# Patient Record
Sex: Male | Born: 1945 | Race: White | Hispanic: No | Marital: Married | Smoking: Never smoker
Health system: Southern US, Community
[De-identification: ages and names within clinical notes are randomized; demographics above are authoritative.]

## PROBLEM LIST (undated history)

## (undated) DIAGNOSIS — Z9989 Dependence on other enabling machines and devices: Secondary | ICD-10-CM

## (undated) DIAGNOSIS — G4733 Obstructive sleep apnea (adult) (pediatric): Secondary | ICD-10-CM

## (undated) DIAGNOSIS — R7989 Other specified abnormal findings of blood chemistry: Secondary | ICD-10-CM

## (undated) DIAGNOSIS — I10 Essential (primary) hypertension: Secondary | ICD-10-CM

## (undated) DIAGNOSIS — R42 Dizziness and giddiness: Secondary | ICD-10-CM

## (undated) DIAGNOSIS — Z8546 Personal history of malignant neoplasm of prostate: Secondary | ICD-10-CM

## (undated) DIAGNOSIS — E559 Vitamin D deficiency, unspecified: Secondary | ICD-10-CM

## (undated) DIAGNOSIS — E291 Testicular hypofunction: Secondary | ICD-10-CM

## (undated) DIAGNOSIS — E785 Hyperlipidemia, unspecified: Secondary | ICD-10-CM

## (undated) DIAGNOSIS — G43909 Migraine, unspecified, not intractable, without status migrainosus: Secondary | ICD-10-CM

## (undated) DIAGNOSIS — N281 Cyst of kidney, acquired: Secondary | ICD-10-CM

## (undated) DIAGNOSIS — R51 Headache: Secondary | ICD-10-CM

## (undated) HISTORY — DX: Dependence on other enabling machines and devices: Z99.89

## (undated) HISTORY — DX: Obstructive sleep apnea (adult) (pediatric): G47.33

## (undated) HISTORY — DX: Cyst of kidney, acquired: N28.1

## (undated) HISTORY — DX: Other specified abnormal findings of blood chemistry: R79.89

## (undated) HISTORY — DX: Testicular hypofunction: E29.1

## (undated) HISTORY — DX: Headache: R51

## (undated) HISTORY — DX: Vitamin D deficiency, unspecified: E55.9

## (undated) HISTORY — DX: Essential (primary) hypertension: I10

## (undated) HISTORY — DX: Migraine, unspecified, not intractable, without status migrainosus: G43.909

## (undated) HISTORY — DX: Hyperlipidemia, unspecified: E78.5

## (undated) HISTORY — DX: Personal history of malignant neoplasm of prostate: Z85.46

---

## 2001-05-02 DIAGNOSIS — Z8546 Personal history of malignant neoplasm of prostate: Secondary | ICD-10-CM

## 2001-05-02 HISTORY — DX: Personal history of malignant neoplasm of prostate: Z85.46

## 2001-05-02 HISTORY — PX: PROSTATECTOMY: SHX69

## 2004-05-02 HISTORY — PX: CHOLECYSTECTOMY: SHX55

## 2010-05-02 DIAGNOSIS — R519 Headache, unspecified: Secondary | ICD-10-CM

## 2010-05-02 HISTORY — DX: Headache, unspecified: R51.9

## 2010-08-01 HISTORY — PX: HERNIA REPAIR: SHX51

## 2010-12-01 DIAGNOSIS — N281 Cyst of kidney, acquired: Secondary | ICD-10-CM

## 2010-12-01 HISTORY — DX: Cyst of kidney, acquired: N28.1

## 2010-12-13 LAB — SEDIMENTATION RATE: Sed Rate: 3 mm

## 2012-07-02 LAB — BASIC METABOLIC PANEL
ALT: 16 U/L (ref 10–40)
Creat: 0.9
Glucose: 96
Sodium: 145 mmol/L (ref 137–147)

## 2012-07-02 LAB — TESTOSTERONE: Testosterone: 339

## 2012-07-02 LAB — CBC
HGB: 15.9 g/dL
WBC: 5.6

## 2012-07-02 LAB — LIPID PANEL
Direct LDL: 101
HDL: 37 mg/dL (ref 35–70)
Triglycerides: 321

## 2012-07-02 LAB — PSA: PSA: <0.1

## 2012-07-02 LAB — TESTOSTERONE, FREE: Testosterone, Free: 6.3

## 2012-10-23 ENCOUNTER — Encounter: Payer: Self-pay | Admitting: Family Medicine

## 2012-10-23 ENCOUNTER — Ambulatory Visit (INDEPENDENT_AMBULATORY_CARE_PROVIDER_SITE_OTHER): Payer: Medicare Other | Admitting: Family Medicine

## 2012-10-23 VITALS — BP 158/86 | HR 76 | Temp 98.0°F | Ht 72.0 in | Wt 210.2 lb

## 2012-10-23 DIAGNOSIS — R51 Headache: Secondary | ICD-10-CM

## 2012-10-23 DIAGNOSIS — E785 Hyperlipidemia, unspecified: Secondary | ICD-10-CM | POA: Insufficient documentation

## 2012-10-23 DIAGNOSIS — I10 Essential (primary) hypertension: Secondary | ICD-10-CM

## 2012-10-23 DIAGNOSIS — G4733 Obstructive sleep apnea (adult) (pediatric): Secondary | ICD-10-CM

## 2012-10-23 DIAGNOSIS — R519 Headache, unspecified: Secondary | ICD-10-CM | POA: Insufficient documentation

## 2012-10-23 DIAGNOSIS — E291 Testicular hypofunction: Secondary | ICD-10-CM

## 2012-10-23 DIAGNOSIS — Z8546 Personal history of malignant neoplasm of prostate: Secondary | ICD-10-CM

## 2012-10-23 DIAGNOSIS — R7989 Other specified abnormal findings of blood chemistry: Secondary | ICD-10-CM | POA: Insufficient documentation

## 2012-10-23 DIAGNOSIS — Z9989 Dependence on other enabling machines and devices: Secondary | ICD-10-CM | POA: Insufficient documentation

## 2012-10-23 NOTE — Assessment & Plan Note (Signed)
Will await records from neurologists and headache specialist Consider CRP/ESR given concern for temporal arteritis.

## 2012-10-23 NOTE — Assessment & Plan Note (Signed)
Elevated today - attributed to new office. Recheck next visit, if persistently elevated, consider addition of ACEI vs HCTZ.

## 2012-10-23 NOTE — Assessment & Plan Note (Signed)
Await records from prior PCP/uro.

## 2012-10-23 NOTE — Assessment & Plan Note (Signed)
Check FLP when returns fasting. 

## 2012-10-23 NOTE — Progress Notes (Signed)
Subjective:    Patient ID: Evan Robinson, male    DOB: May 11, 1945, 67 y.o.   MRN: [id number]  HPI CC: new pt to establish  "Evan Robinson".  Recently moved from [place].    HTN - at home running high around 140sbp.  Has tried water pill which didn't help.  Years ago beta blocker caused adverse effects.  HLD - stopped chol med 1 year ago.  Chronic headaches up to days at a time - started 2 years ago.  Has even tried stopping statin which hasn't helped.  Takes omega krill oil daily.  Would like chol levels checked.  Work-up so far - brings list: ENT evaluation, chiropractor eval, accupuncture, ophtho eval (no glaucoma), botox treatments to neck and scalp, spinal tap, 2 brain and neck MRIs, 2 CT scans, food allergy testing, 3 different neurologists even HA specialist (Dr. Jamse [place]), multiple med regimens, changed diet to vegan diet, changed all chronic meds - all these things didn't help.  Now seeing genetic specialist at [facility].  Describes headaches as bilateral dull temporal throbbing, when worst pain has associated blurry vision.   Takes hydrocodone which doesn't really help.    OSA - on CPAP.  ambien to sleep at night.  "Evan Robinson" Lives with wife Occupation: retired, was Loss adjuster, chartered Activity: walks daily, some gym Diet:   Preventative: Last CPE was last summer Colonoscopy pending 09/2012 [facility]  Medications and allergies reviewed and updated in chart.  Past histories reviewed and updated if relevant as below. There are no active problems to display for this patient.  Past Medical History  Diagnosis Date  . HTN (hypertension)   . Generalized headaches   . Low testosterone   . H/O prostate cancer 2003    s/p radical prostatectomy (uro Dr. Rayburn Ma in [place] yearly)  . Dyslipidemia   . Migraines    Past Surgical History  Procedure Laterality Date  . Cholecystectomy  2006  . Hernia repair  2011  . Prostatectomy  2003   History  Substance Use Topics  . Smoking status: Never  Smoker   . Smokeless tobacco: Never Used  . Alcohol Use: No   Family History  Problem Relation Age of Onset  . Alcohol abuse Father   . Alcohol abuse Paternal Grandfather   . Stroke Mother   . Cancer Father     prostate  . CAD Father     possible MI  . Diabetes Neg Hx    No Known Allergies No current outpatient prescriptions on file prior to visit.   No current facility-administered medications on file prior to visit.     Review of Systems  Constitutional: Negative for fever, chills, activity change, appetite change, fatigue and unexpected weight change.  HENT: Negative for hearing loss and neck pain.   Eyes: Negative for visual disturbance.  Respiratory: Negative for cough, chest tightness, shortness of breath and wheezing.   Cardiovascular: Negative for chest pain, palpitations and leg swelling.  Gastrointestinal: Negative for nausea, vomiting, abdominal pain, diarrhea, constipation, blood in stool and abdominal distention.  Genitourinary: Negative for hematuria and difficulty urinating.  Musculoskeletal: Negative for myalgias and arthralgias.  Skin: Negative for rash.  Neurological: Positive for headaches. Negative for dizziness, seizures and syncope.  Hematological: Negative for adenopathy. Does not bruise/bleed easily.  Psychiatric/Behavioral: Negative for dysphoric mood. The patient is not nervous/anxious.        Objective:   Physical Exam  Nursing note and vitals reviewed. Constitutional: He is oriented to person, place, and time. He  appears well-developed and well-nourished. No distress.  HENT:  Head: Normocephalic and atraumatic.  Right Ear: Hearing, tympanic membrane, external ear and ear canal normal.  Left Ear: Hearing, tympanic membrane, external ear and ear canal normal.  Nose: Nose normal. No mucosal edema or rhinorrhea.  Mouth/Throat: Uvula is midline, oropharynx is clear and moist and mucous membranes are normal. No oropharyngeal exudate, posterior  oropharyngeal edema, posterior oropharyngeal erythema or tonsillar abscesses.  No pain bitemporal region  Eyes: Conjunctivae and EOM are normal. Pupils are equal, round, and reactive to light. No scleral icterus.  Neck: Normal range of motion. Neck supple. No thyromegaly present.  Cardiovascular: Normal rate, regular rhythm, normal heart sounds and intact distal pulses.   No murmur heard. Pulses:      Radial pulses are 2+ on the right side, and 2+ on the left side.  Pulmonary/Chest: Effort normal and breath sounds normal. No respiratory distress. He has no wheezes. He has no rales.  Abdominal: Soft. Bowel sounds are normal. He exhibits no distension and no mass. There is no tenderness. There is no rebound and no guarding.  Musculoskeletal: Normal range of motion. He exhibits no edema.  Lymphadenopathy:    He has no cervical adenopathy.  Neurological: He is alert and oriented to person, place, and time.  CN grossly intact, station and gait intact  Skin: Skin is warm and dry. No rash noted.  Psychiatric: He has a normal mood and affect. His behavior is normal. Judgment and thought content normal.       Assessment & Plan:

## 2012-10-23 NOTE — Assessment & Plan Note (Signed)
Stable on CPAP 

## 2012-10-23 NOTE — Patient Instructions (Signed)
Let's keep an eye on blood pressure at home.  Let me know if consistently elevated >140/90. We will request records from prior PCP and neurologist for headache workup. Check with neurologist to ensure they don't think this is temporal arteritis. Have colonoscopy report sent to [place] to update your chart. Good to meet you today, call us withq uestions. Return at your convenience in next 2 months fasting for blood work, afterwards for [employer] visit.

## 2012-10-23 NOTE — Assessment & Plan Note (Signed)
Needs yearly PSA.  Sees urologist

## 2012-11-18 ENCOUNTER — Encounter: Payer: Self-pay | Admitting: Family Medicine

## 2012-11-18 DIAGNOSIS — E559 Vitamin D deficiency, unspecified: Secondary | ICD-10-CM | POA: Insufficient documentation

## 2012-11-23 ENCOUNTER — Encounter: Payer: Self-pay | Admitting: Family Medicine

## 2016-12-29 ENCOUNTER — Other Ambulatory Visit: Payer: Self-pay | Admitting: Gastroenterology

## 2016-12-29 DIAGNOSIS — R1084 Generalized abdominal pain: Secondary | ICD-10-CM

## 2016-12-29 DIAGNOSIS — R634 Abnormal weight loss: Secondary | ICD-10-CM

## 2016-12-30 ENCOUNTER — Ambulatory Visit
Admission: RE | Admit: 2016-12-30 | Discharge: 2016-12-30 | Disposition: A | Discharge status: Home / Self Care | Payer: Medicare Other | Source: Ambulatory Visit | Attending: Gastroenterology | Admitting: Gastroenterology

## 2016-12-30 DIAGNOSIS — R634 Abnormal weight loss: Secondary | ICD-10-CM | POA: Diagnosis present

## 2016-12-30 DIAGNOSIS — R1084 Generalized abdominal pain: Secondary | ICD-10-CM | POA: Insufficient documentation

## 2016-12-30 DIAGNOSIS — I700 Atherosclerosis of aorta: Secondary | ICD-10-CM | POA: Diagnosis not present

## 2016-12-30 MED ORDER — IOPAMIDOL (ISOVUE-300) INJECTION 61%
125.0000 mL | Freq: Once | INTRAVENOUS | Status: AC | PRN
  Administered 2016-12-30: 125 mL via INTRAVENOUS

## 2017-01-31 ENCOUNTER — Encounter: Payer: Self-pay | Admitting: *Deleted

## 2017-02-01 ENCOUNTER — Ambulatory Visit: Payer: Medicare Other | Admitting: Anesthesiology

## 2017-02-01 ENCOUNTER — Ambulatory Visit
Admission: RE | Admit: 2017-02-01 | Discharge: 2017-02-01 | Disposition: A | Discharge status: Home / Self Care | Payer: Medicare Other | Source: Ambulatory Visit | Attending: Internal Medicine | Admitting: Internal Medicine

## 2017-02-01 ENCOUNTER — Encounter: Payer: Self-pay | Admitting: *Deleted

## 2017-02-01 ENCOUNTER — Encounter
Admission: RE | Disposition: A | Discharge status: Home / Self Care | Payer: Self-pay | Source: Ambulatory Visit | Attending: Internal Medicine

## 2017-02-01 DIAGNOSIS — I10 Essential (primary) hypertension: Secondary | ICD-10-CM | POA: Insufficient documentation

## 2017-02-01 DIAGNOSIS — K641 Second degree hemorrhoids: Secondary | ICD-10-CM | POA: Diagnosis not present

## 2017-02-01 DIAGNOSIS — Z6826 Body mass index (BMI) 26.0-26.9, adult: Secondary | ICD-10-CM | POA: Insufficient documentation

## 2017-02-01 DIAGNOSIS — K2970 Gastritis, unspecified, without bleeding: Secondary | ICD-10-CM | POA: Diagnosis not present

## 2017-02-01 DIAGNOSIS — Z8546 Personal history of malignant neoplasm of prostate: Secondary | ICD-10-CM | POA: Diagnosis not present

## 2017-02-01 DIAGNOSIS — E785 Hyperlipidemia, unspecified: Secondary | ICD-10-CM | POA: Insufficient documentation

## 2017-02-01 DIAGNOSIS — Q6101 Congenital single renal cyst: Secondary | ICD-10-CM | POA: Diagnosis not present

## 2017-02-01 DIAGNOSIS — Z7982 Long term (current) use of aspirin: Secondary | ICD-10-CM | POA: Insufficient documentation

## 2017-02-01 DIAGNOSIS — R634 Abnormal weight loss: Secondary | ICD-10-CM | POA: Diagnosis present

## 2017-02-01 DIAGNOSIS — G43909 Migraine, unspecified, not intractable, without status migrainosus: Secondary | ICD-10-CM | POA: Diagnosis not present

## 2017-02-01 DIAGNOSIS — E291 Testicular hypofunction: Secondary | ICD-10-CM | POA: Diagnosis not present

## 2017-02-01 DIAGNOSIS — G4733 Obstructive sleep apnea (adult) (pediatric): Secondary | ICD-10-CM | POA: Diagnosis not present

## 2017-02-01 DIAGNOSIS — Z9889 Other specified postprocedural states: Secondary | ICD-10-CM | POA: Insufficient documentation

## 2017-02-01 DIAGNOSIS — R1013 Epigastric pain: Secondary | ICD-10-CM | POA: Insufficient documentation

## 2017-02-01 DIAGNOSIS — Z79899 Other long term (current) drug therapy: Secondary | ICD-10-CM | POA: Insufficient documentation

## 2017-02-01 DIAGNOSIS — F419 Anxiety disorder, unspecified: Secondary | ICD-10-CM | POA: Insufficient documentation

## 2017-02-01 DIAGNOSIS — E559 Vitamin D deficiency, unspecified: Secondary | ICD-10-CM | POA: Diagnosis not present

## 2017-02-01 DIAGNOSIS — K209 Esophagitis, unspecified: Secondary | ICD-10-CM | POA: Diagnosis not present

## 2017-02-01 DIAGNOSIS — Z888 Allergy status to other drugs, medicaments and biological substances status: Secondary | ICD-10-CM | POA: Insufficient documentation

## 2017-02-01 HISTORY — PX: ESOPHAGOGASTRODUODENOSCOPY (EGD) WITH PROPOFOL: SHX5813

## 2017-02-01 HISTORY — PX: COLONOSCOPY WITH PROPOFOL: SHX5780

## 2017-02-01 SURGERY — ESOPHAGOGASTRODUODENOSCOPY (EGD) WITH PROPOFOL
Anesthesia: General

## 2017-02-01 MED ORDER — PROPOFOL 500 MG/50ML IV EMUL
INTRAVENOUS | Status: DC | PRN
  Administered 2017-02-01: 140 ug/kg/min via INTRAVENOUS

## 2017-02-01 MED ORDER — SODIUM CHLORIDE 0.9 % IV SOLN
INTRAVENOUS | Status: DC
  Administered 2017-02-01: 1000 mL via INTRAVENOUS

## 2017-02-01 MED ORDER — PROPOFOL 10 MG/ML IV BOLUS
INTRAVENOUS | Status: DC | PRN
  Administered 2017-02-01: 100 mg via INTRAVENOUS

## 2017-02-01 MED ORDER — LIDOCAINE HCL (CARDIAC) 20 MG/ML IV SOLN
INTRAVENOUS | Status: DC | PRN
  Administered 2017-02-01: 100 mg via INTRAVENOUS

## 2017-02-01 MED ORDER — GLYCOPYRROLATE 0.2 MG/ML IV SOSY
PREFILLED_SYRINGE | INTRAVENOUS | Status: DC | PRN
  Administered 2017-02-01: .2 mg via INTRAVENOUS

## 2017-02-01 MED ORDER — GLYCOPYRROLATE 0.2 MG/ML IJ SOLN
INTRAMUSCULAR | Status: AC
  Filled 2017-02-01: qty 1

## 2017-02-01 MED ORDER — LIDOCAINE HCL (PF) 2 % IJ SOLN
INTRAMUSCULAR | Status: AC
  Filled 2017-02-01: qty 10

## 2017-02-01 MED ORDER — PROPOFOL 500 MG/50ML IV EMUL
INTRAVENOUS | Status: AC
  Filled 2017-02-01: qty 50

## 2017-02-01 NOTE — Interval H&P Note (Signed)
History and Physical Interval Note:  02/01/2017 9:59 AM  Evan Robinson  has presented today for surgery, with the diagnosis of DIFFUSE ABDOMINAL PAIN,WEIGHT LOSS  The various methods of treatment have been discussed with the patient and family. After consideration of risks, benefits and other options for treatment, the patient has consented to  Procedure(s): ESOPHAGOGASTRODUODENOSCOPY (EGD) WITH PROPOFOL (N/A) COLONOSCOPY WITH PROPOFOL (N/A) as a surgical intervention .  The patient's history has been reviewed, patient examined, no change in status, stable for surgery.  I have reviewed the patient's chart and labs.  Questions were answered to the patient's satisfaction.     [place], [place]

## 2017-02-01 NOTE — Transfer of Care (Signed)
Immediate Anesthesia Transfer of Care Note  Patient: Evan Robinson  Procedure(s) Performed: ESOPHAGOGASTRODUODENOSCOPY (EGD) WITH PROPOFOL (N/A ) COLONOSCOPY WITH PROPOFOL (N/A )  Patient Location: Endoscopy Unit  Anesthesia Type:General  Level of Consciousness: sedated  Airway & Oxygen Therapy: Patient Spontanous Breathing and Patient connected to nasal cannula oxygen  Post-op Assessment: Report given to RN and Post -op Vital signs reviewed and stable  Post vital signs: Reviewed and stable  Last Vitals:  Vitals:   02/01/17 0934  BP: (!) 171/83  Pulse: 85  Resp: 18  Temp: 36.6 C  SpO2: 98%    Last Pain:  Vitals:   02/01/17 0934  TempSrc: Tympanic  PainSc: 10-Worst pain ever         Complications: No apparent anesthesia complications

## 2017-02-01 NOTE — Anesthesia Post-op Follow-up Note (Signed)
Anesthesia QCDR form completed.        

## 2017-02-01 NOTE — Anesthesia Preprocedure Evaluation (Signed)
Anesthesia Evaluation  Patient identified by MRN, date of birth, ID band Patient awake    Reviewed: Allergy & Precautions, H&P , NPO status , Patient's Chart, lab work & pertinent test results  History of Anesthesia Complications Negative for: history of anesthetic complications  Airway Mallampati: III  TM Distance: <3 FB Neck ROM: limited    Dental  (+) Poor Dentition, Chipped   Pulmonary neg shortness of breath, sleep apnea and Continuous Positive Airway Pressure Ventilation ,           Cardiovascular Exercise Tolerance: Good hypertension, (-) angina(-) Past MI and (-) DOE      Neuro/Psych  Headaches, negative psych ROS   GI/Hepatic negative GI ROS, Neg liver ROS, GERD  Medicated and Controlled,  Endo/Other  negative endocrine ROS  Renal/GU Renal disease  negative genitourinary   Musculoskeletal   Abdominal   Peds  Hematology negative hematology ROS (+)   Anesthesia Other Findings Past Medical History: No date: Dyslipidemia 2003: H/O prostate cancer     Comment:  s/p radical prostatectomy (uro Dr. Rush Farmer in [place]               yearly) 2012: Headache     Comment:  thorough w/u - unrevealing.  has seen 3 neurologists,               MRI, LP nonacute.  ESR = 3 No date: HTN (hypertension) No date: Hypogonadism male No date: Low testosterone No date: Migraines No date: OSA on CPAP 12/2010: Renal cyst     Comment:  complex cyst upper pole L kidney with no malignancy by               MRI No date: Vitamin D deficiency  Past Surgical History: 2006: CHOLECYSTECTOMY 08/2010: HERNIA REPAIR 2003: PROSTATECTOMY  BMI    Body Mass Index:  26.58 kg/m      Reproductive/Obstetrics negative OB ROS                             Anesthesia Physical Anesthesia Plan  ASA: III  Anesthesia Plan: General   Post-op Pain Management:    Induction: Intravenous  PONV Risk Score and Plan:  Propofol infusion  Airway Management Planned: Natural Airway and Nasal Cannula  Additional Equipment:   Intra-op Plan:   Post-operative Plan:   Informed Consent: I have reviewed the patients History and Physical, chart, labs and discussed the procedure including the risks, benefits and alternatives for the proposed anesthesia with the patient or authorized representative who has indicated his/her understanding and acceptance.   Dental Advisory Given  Plan Discussed with: Anesthesiologist, CRNA and Surgeon  Anesthesia Plan Comments: (Patient consented for risks of anesthesia including but not limited to:  - adverse reactions to medications - risk of intubation if required - damage to teeth, lips or other oral mucosa - sore throat or hoarseness - Damage to heart, brain, lungs or loss of life  Patient voiced understanding.)        Anesthesia Quick Evaluation

## 2017-02-01 NOTE — Anesthesia Postprocedure Evaluation (Signed)
Anesthesia Post Note  Patient: Evan Robinson  Procedure(s) Performed: ESOPHAGOGASTRODUODENOSCOPY (EGD) WITH PROPOFOL (N/A ) COLONOSCOPY WITH PROPOFOL (N/A )  Patient location during evaluation: Endoscopy Anesthesia Type: General Level of consciousness: awake and alert Pain management: pain level controlled Vital Signs Assessment: post-procedure vital signs reviewed and stable Respiratory status: spontaneous breathing, nonlabored ventilation, respiratory function stable and patient connected to nasal cannula oxygen Cardiovascular status: blood pressure returned to baseline and stable Postop Assessment: no apparent nausea or vomiting Anesthetic complications: no     Last Vitals:  Vitals:   02/01/17 0934 02/01/17 1033  BP: (!) 171/83   Pulse: 85   Resp: 18   Temp: 36.6 C (!) 36.2 C  SpO2: 98%     Last Pain:  Vitals:   02/01/17 1033  TempSrc: Tympanic  PainSc: Asleep                 [name] Evan Robinson

## 2017-02-01 NOTE — H&P (Signed)
Outpatient short stay form Pre-procedure 02/01/2017 9:56 AM Evan Robinson K. Alice Reichert, M.D.  Primary Physician: Christianne Borrow, M.D.  Reason for visit:  Abdominal pain, weight loss  History of present illness:  71 y/o male with personal hx of anxiety and prostate cancer presents for persistent periumbilical pain and an approximately 10 lb weight loss. He denies any change in bowel habits or weight loss. He denies any ugi symptoms of dysphagia, heartburn, nausea or vomiting. Recent CT scan of the abdomen/pelvis was essentially normal.     Current Facility-Administered Medications:  .  0.9 %  sodium chloride infusion, , Intravenous, Continuous, [place] Grant, Benay Pike, MD, Last Rate: 20 mL/hr at 02/01/17 0950, 1,000 mL at 02/01/17 0950  Facility-Administered Medications Ordered in Other Encounters:  .  glycopyrrolate in normal saline (0.47m/mL) syringe, , Intravenous, Anesthesia Intra-op, [place], JPrecious Haws MD, 0.2 mg at 02/01/17 00093 Prescriptions Prior to Admission  Medication Sig Dispense Refill Last Dose  . gemfibrozil (LOPID) 600 MG tablet Take 600 mg by mouth 2 (two) times daily before a meal.     . losartan (COZAAR) 25 MG tablet Take 25 mg by mouth daily.   02/01/2017 at Unknown time  . ALPRAZolam (XANAX PO) Take by mouth.   Taking  . aspirin 81 MG tablet Take 81 mg by mouth daily.   Taking  . cholecalciferol (VITAMIN D) 1000 UNITS tablet Take 2,000 Units by mouth daily.   Taking  . diltiazem (CARDIZEM CD) 300 MG 24 hr capsule Take 300 mg by mouth daily.   Taking  . HYDROcodone-acetaminophen (NORCO/VICODIN) 5-325 MG per tablet Take 1 tablet by mouth every 6 (six) hours as needed (headache).   Taking  . Magnesium 250 MG TABS Take 250 mg by mouth daily.   Taking  . Multiple Vitamin (MULTIVITAMIN) tablet Take 1 tablet by mouth daily.   Taking  . Zolpidem Tartrate (AMBIEN PO) Take by mouth at bedtime.   Taking  . [DISCONTINUED] KRILL OIL PO Take 500 mg by mouth daily.   Taking  . [DISCONTINUED]  Testosterone 20.25 MG/ACT (1.62%) GEL Place 1.62 mg onto the skin daily.   Taking     Allergies  Allergen Reactions  . Fenofibrate Other (See Comments)    myalgias     Past Medical History:  Diagnosis Date  . Dyslipidemia   . H/O prostate cancer 2003   [place]  . Headache 2012   thorough w/u - unrevealing.  has seen 3 neurologists, MRI, LP nonacute.  ESR = 3  . HTN (hypertension)   . Hypogonadism male   . Low testosterone   . Migraines   . OSA on CPAP   . 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 Vitamin D deficiency     Review of systems:      Physical Exam  General appearance: alert, cooperative and appears stated age Resp: clear to auscultation bilaterally Cardio: regular rate and rhythm, S1, S2 normal, no murmur, click, rub or gallop GI: soft, non-tender; bowel sounds normal; no masses,  no organomegaly     Planned procedures: Proceed with EGD and colonoscopy. The patient understands the nature of the planned procedure, indications, risks, alternatives and potential complications including but not limited to bleeding, infection, perforation, damage to internal organs and possible oversedation/side effects from anesthesia. The patient agrees and gives consent to proceed.  Please refer to procedure notes for findings, recommendations and  patient disposition/instructions.    Evan Robinson K. Alice Reichert, M.D. Gastroenterology 02/01/2017  9:56 AM

## 2017-02-01 NOTE — Op Note (Signed)
[facility] Patient Name: Evan Robinson Procedure Date: 02/01/2017 10:00 AM MRN: [id number] Account #: [id number] Date of Birth: 01/14/1946 Admit Type: Outpatient Age: 71 Room: [facility] ENDO ROOM 1 Gender: Male Note Status: Finalized Procedure:            Upper GI endoscopy Indications:          Epigastric abdominal pain, Weight loss Providers:            Boykin Nearing. Norma Fredrickson MD, MD Referring MD:         No Local Md, MD (Referring MD) Medicines:            Propofol per Anesthesia Complications:        No immediate complications. Procedure:            Pre-Anesthesia Assessment:                       - ASA Grade Assessment: III - A patient with severe                        systemic disease.                       After obtaining informed consent, the endoscope was                        passed under direct vision. Throughout the procedure,                        the patient's blood pressure, pulse, and oxygen                        saturations were monitored continuously. The Endoscope                        was introduced through the mouth, and advanced to the                        third part of duodenum. The upper GI endoscopy was                        accomplished without difficulty. The patient tolerated                        the procedure well. Findings:      LA Grade B (one or more mucosal breaks greater than 5 mm, not extending       between the tops of two mucosal folds) esophagitis with no bleeding was       found 23 cm from the incisors. Biopsies were obtained from the proximal       and distal esophagus with cold forceps for histology of suspected       eosinophilic esophagitis.      Localized mild inflammation characterized by erythema was found in the       gastric antrum. Biopsies were taken with a cold forceps for Helicobacter       pylori testing.      The examined duodenum was normal.      Patchy mildly erythematous mucosa without  active bleeding and with no       stigmata of bleeding was found in the first portion of the duodenum and  in the second portion of the duodenum. Biopsies for histology were taken       with a cold forceps for evaluation of celiac disease. Impression:           - LA Grade B non-erosive esophagitis. Biopsied.                       - Gastritis. Biopsied.                       - Normal examined duodenum.                       - Erythematous duodenopathy. Biopsied. Recommendation:       - Proceed with colonoscopy                       - Proceed with colonoscopy Procedure Code(s):    --- Professional ---                       [phone], Esophagogastroduodenoscopy, flexible, transoral;                        with biopsy, single or multiple Diagnosis Code(s):    --- Professional ---                       K20.8, Other esophagitis                       K29.70, Gastritis, unspecified, without bleeding                       K31.89, Other diseases of stomach and duodenum                       R10.13, Epigastric pain                       R63.4, Abnormal weight loss CPT copyright 2016 American Medical Association. All rights reserved. The codes documented in this report are preliminary and upon coder review may  be revised to meet current compliance requirements. Attending Participation:      I personally performed the entire procedure. Stanton Kidney MD, MD 02/01/2017 10:15:42 AM This report has been signed electronically. Number of Addenda: 0 Note Initiated On: 02/01/2017 10:00 AM      [facility]

## 2017-02-01 NOTE — Op Note (Signed)
[facility] Gastroenterology Patient Name: Evan Robinson Procedure Date: 02/01/2017 10:00 AM MRN: [id number] Account #: [id number] Date of Birth: [birth year] Admit Type: Outpatient Age: 71 Room: [facility] ENDO ROOM 1 Gender: Male Note Status: Finalized Procedure:            Colonoscopy Indications:          Weight loss Providers:            Royce Macadamia K. Norma Fredrickson MD, MD Referring MD:         No Local Md, MD (Referring MD) Medicines:            Propofol per Anesthesia Complications:        No immediate complications. Procedure:            Pre-Anesthesia Assessment:                       - ASA Grade Assessment: III - A patient with severe                        systemic disease.                       - After reviewing the risks and benefits, the patient                        was deemed in satisfactory condition to undergo the                        procedure.                       After obtaining informed consent, the colonoscope was                        passed under direct vision. Throughout the procedure,                        the patient's blood pressure, pulse, and oxygen                        saturations were monitored continuously. The                        Colonoscope was introduced through the anus and                        advanced to the the terminal ileum, with identification                        of the appendiceal orifice and IC valve. The terminal                        ileum, ileocecal valve, appendiceal orifice, and rectum                        were photographed. The colonoscopy was performed                        without difficulty. The patient tolerated the procedure  well. The quality of the bowel preparation was good. Findings:      Non-bleeding internal hemorrhoids were found during retroflexion. The       hemorrhoids were medium-sized and Grade II (internal hemorrhoids that       prolapse but reduce spontaneously).       The entire examined colon appeared normal on direct and retroflexion       views. Impression:           - Non-bleeding internal hemorrhoids.                       - The entire examined colon is normal on direct and                        retroflexion views.                       - No specimens collected. Recommendation:       - Patient has a contact number available for                        emergencies. The signs and symptoms of potential                        delayed complications were discussed with the patient.                        Return to normal activities tomorrow. Written discharge                        instructions were provided to the patient.                       - Resume previous diet.                       - Continue present medications.                       - Repeat colonoscopy in 10 years for screening purposes.                       - Return to GI office as previously scheduled.                       - Telephone GI clinic for pathology results in 1 week.                       - The findings and recommendations were discussed with                        the patient.                       - The findings and recommendations were discussed with                        the patient's family. Procedure Code(s):    --- Professional ---                       [phone], Colonoscopy, flexible; diagnostic, including  collection of specimen(s) by brushing or washing, when                        performed (separate procedure) Diagnosis Code(s):    --- Professional ---                       K64.1, Second degree hemorrhoids                       R63.4, Abnormal weight loss CPT copyright 2016 American Medical Association. All rights reserved. The codes documented in this report are preliminary and upon coder review may  be revised to meet current compliance requirements. Stanton Kidney MD, MD 02/01/2017 10:30:38 AM This report has been signed  electronically. Number of Addenda: 0 Note Initiated On: 02/01/2017 10:00 AM Scope Withdrawal Time: 0 hours 5 minutes 30 seconds  Total Procedure Duration: 0 hours 9 minutes 38 seconds       [facility]

## 2017-02-02 ENCOUNTER — Encounter: Payer: Self-pay | Admitting: Internal Medicine

## 2017-02-03 LAB — SURGICAL PATHOLOGY

## 2017-11-01 ENCOUNTER — Other Ambulatory Visit: Payer: Self-pay | Admitting: Neurology

## 2017-11-01 DIAGNOSIS — R200 Anesthesia of skin: Secondary | ICD-10-CM

## 2017-11-01 DIAGNOSIS — M5412 Radiculopathy, cervical region: Secondary | ICD-10-CM

## 2017-11-10 ENCOUNTER — Other Ambulatory Visit
Admission: RE | Admit: 2017-11-10 | Discharge: 2017-11-10 | Disposition: A | Discharge status: Home / Self Care | Payer: Medicare Other | Source: Ambulatory Visit | Attending: Neurology | Admitting: Neurology

## 2017-11-10 ENCOUNTER — Ambulatory Visit
Admission: RE | Admit: 2017-11-10 | Discharge: 2017-11-10 | Disposition: A | Discharge status: Home / Self Care | Payer: Medicare Other | Source: Ambulatory Visit | Attending: Neurology | Admitting: Neurology

## 2017-11-10 DIAGNOSIS — M5412 Radiculopathy, cervical region: Secondary | ICD-10-CM | POA: Diagnosis not present

## 2017-11-10 DIAGNOSIS — G319 Degenerative disease of nervous system, unspecified: Secondary | ICD-10-CM | POA: Insufficient documentation

## 2017-11-10 DIAGNOSIS — R202 Paresthesia of skin: Secondary | ICD-10-CM | POA: Insufficient documentation

## 2017-11-10 DIAGNOSIS — R200 Anesthesia of skin: Secondary | ICD-10-CM | POA: Diagnosis not present

## 2017-11-10 DIAGNOSIS — G43909 Migraine, unspecified, not intractable, without status migrainosus: Secondary | ICD-10-CM | POA: Diagnosis not present

## 2017-11-10 DIAGNOSIS — M4802 Spinal stenosis, cervical region: Secondary | ICD-10-CM | POA: Insufficient documentation

## 2017-11-10 DIAGNOSIS — M2578 Osteophyte, vertebrae: Secondary | ICD-10-CM | POA: Insufficient documentation

## 2017-11-10 LAB — CREATININE, SERUM
CREATININE: 1.13 mg/dL (ref 0.61–1.24)
GFR calc Af Amer: >60 mL/min (ref >60)

## 2017-11-10 MED ORDER — GADOBENATE DIMEGLUMINE 529 MG/ML IV SOLN
18.0000 mL | Freq: Once | INTRAVENOUS | Status: AC | PRN
  Administered 2017-11-10: 18 mL via INTRAVENOUS

## 2018-03-02 ENCOUNTER — Other Ambulatory Visit: Payer: Self-pay

## 2018-03-02 ENCOUNTER — Encounter: Payer: Medicare Other | Attending: Physical Medicine & Rehabilitation | Admitting: Physical Medicine & Rehabilitation

## 2018-03-02 ENCOUNTER — Encounter: Payer: Self-pay | Admitting: Physical Medicine & Rehabilitation

## 2018-03-02 VITALS — BP 156/75 | HR 68 | Ht 72.0 in | Wt 189.8 lb

## 2018-03-02 DIAGNOSIS — M792 Neuralgia and neuritis, unspecified: Secondary | ICD-10-CM | POA: Diagnosis not present

## 2018-03-02 DIAGNOSIS — M4802 Spinal stenosis, cervical region: Secondary | ICD-10-CM | POA: Diagnosis not present

## 2018-03-02 DIAGNOSIS — G8929 Other chronic pain: Secondary | ICD-10-CM | POA: Diagnosis not present

## 2018-03-02 DIAGNOSIS — I10 Essential (primary) hypertension: Secondary | ICD-10-CM | POA: Insufficient documentation

## 2018-03-02 DIAGNOSIS — G43909 Migraine, unspecified, not intractable, without status migrainosus: Secondary | ICD-10-CM | POA: Diagnosis not present

## 2018-03-02 DIAGNOSIS — G501 Atypical facial pain: Secondary | ICD-10-CM | POA: Diagnosis not present

## 2018-03-02 DIAGNOSIS — G479 Sleep disorder, unspecified: Secondary | ICD-10-CM | POA: Diagnosis not present

## 2018-03-02 DIAGNOSIS — Z8546 Personal history of malignant neoplasm of prostate: Secondary | ICD-10-CM | POA: Diagnosis not present

## 2018-03-02 DIAGNOSIS — E559 Vitamin D deficiency, unspecified: Secondary | ICD-10-CM | POA: Diagnosis not present

## 2018-03-02 DIAGNOSIS — G4733 Obstructive sleep apnea (adult) (pediatric): Secondary | ICD-10-CM | POA: Diagnosis not present

## 2018-03-02 DIAGNOSIS — E785 Hyperlipidemia, unspecified: Secondary | ICD-10-CM | POA: Insufficient documentation

## 2018-03-02 DIAGNOSIS — R208 Other disturbances of skin sensation: Secondary | ICD-10-CM | POA: Insufficient documentation

## 2018-03-02 MED ORDER — GABAPENTIN 600 MG PO TABS: 900.0000 mg | ORAL_TABLET | Freq: Three times a day (TID) | ORAL | 1 refills | Status: DC

## 2018-03-02 NOTE — Progress Notes (Signed)
 Subjective:    Patient ID: Evan Robinson, male    DOB: 06/23/1945, 72 y.o.   MRN: [id number]  HPI 72 y/o year young male with pmh of Vit D deficiency, OSA, migraines, low testosterone, HTN, presents with burning in face and shoulders.  Started ~12/2016. He went to IM, Endo, Derm with negative workup. He had lab work for flushing and workup was negative. He saw Neurology, started on Cymbalta and Topomax without benefit. He had brain/C-spine MRI, which showed cervical stenosis. He saw Neurology and was told not radiculopathy related. Progressively getting worse.  Denies alleviating factors. Associated cold hands. Stationary positions exacerbate the pain.  Burning type pain. Radiates to all extremities at times. Denies associated weakness. Denies falls. Wants to travel and grandchildren. Pheochromocytoma workup negative.  Pain Inventory Average Pain 8 Pain Right Now 8 My pain is burning  In the last 24 hours, has pain interfered with the following? General activity 7 Relation with others 7 Enjoyment of life 7 What TIME of day is your pain at its worst? evening night Sleep (in general) Fair  Pain is worse with: sitting, inactivity and some activites Pain improves with: none Relief from Meds: none  Mobility walk without assistance how many minutes can you walk? unlimited ability to climb steps?  yes do you drive?  yes  Function not employed: date last employed 2008  Neuro/Psych tingling  Prior Studies Any changes since last visit?  no  Physicians involved in your care Primary care Dr. Gaines Neurologist Dr. B   Family History  Problem Relation Age of Onset  . Alcohol abuse Father   . Cancer Father        prostate  . CAD Father        possible MI  . Alcohol abuse Paternal Grandfather   . Stroke Mother   . Diabetes Neg Hx    Social History   Socioeconomic History  . Marital status: Married    Spouse name: Not on file  . Number of children: Not on file  . Years of  education: Not on file  . Highest education level: Not on file  Occupational History  . Not on file  Social Needs  . Financial resource strain: Not on file  . Food insecurity:    Worry: Not on file    Inability: Not on file  . Transportation needs:    Medical: Not on file    Non-medical: Not on file  Tobacco Use  . Smoking status: Never Smoker  . Smokeless tobacco: Never Used  Substance and Sexual Activity  . Alcohol use: No  . Drug use: No  . Sexual activity: Not on file  Lifestyle  . Physical activity:    Days per week: Not on file    Minutes per session: Not on file  . Stress: Not on file  Relationships  . Social connections:    Talks on phone: Not on file    Gets together: Not on file    Attends religious service: Not on file    Active member of club or organization: Not on file    Attends meetings of clubs or organizations: Not on file    Relationship status: Not on file  Other Topics Concern  . Not on file  Social History Narrative   "Gene"   Lives with wife   Occupation: retired, was CPA   Edu:BS   Activity: walks daily, some gym   Diet:    Past Surgical History:  Procedure   Laterality Date  . CHOLECYSTECTOMY  2006  . COLONOSCOPY WITH PROPOFOL N/A 02/01/2017   Procedure: COLONOSCOPY WITH PROPOFOL;  Surgeon: Toledo, Teodoro K, MD;  Location: [facility];  Service: Gastroenterology;  Laterality: N/A;  . ESOPHAGOGASTRODUODENOSCOPY (EGD) WITH PROPOFOL N/A 02/01/2017   Procedure: ESOPHAGOGASTRODUODENOSCOPY (EGD) WITH PROPOFOL;  Surgeon: Toledo, Teodoro K, MD;  Location: [facility];  Service: Gastroenterology;  Laterality: N/A;  . HERNIA REPAIR  08/2010  . PROSTATECTOMY  2003   Past Medical History:  Diagnosis Date  . Dyslipidemia   . H/O prostate cancer 2003   s/p radical prostatectomy (uro Dr. Blackmon in [place] yearly)  . Headache 2012   thorough w/u - unrevealing.  has seen 3 neurologists, MRI, LP nonacute.  ESR = 3  . HTN (hypertension)   .  Hypogonadism male   . Low testosterone   . Migraines   . OSA on CPAP   . 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 Vitamin D deficiency    BP (!) 156/75   Pulse 68   Ht 6' (1.829 m)   Wt 189 lb 12.8 oz (86.1 kg)   SpO2 98%   BMI 25.74 kg/m   Opioid Risk Score:   Fall Risk Score:  `1  Depression screen PHQ 2/9  No flowsheet data found.   Review of Systems  Constitutional: Positive for appetite change and unexpected weight change.  HENT: Negative.   Eyes: Negative.   Respiratory: Positive for apnea.   Cardiovascular: Negative.   Gastrointestinal: Negative.   Endocrine: Negative.   Genitourinary: Negative.   Musculoskeletal: Negative.   Skin: Positive for color change.  Allergic/Immunologic: Negative.   Neurological: Negative.        Burning  Hematological: Negative.   Psychiatric/Behavioral: Negative.   All other systems reviewed and are negative.     Objective:   Physical Exam Gen: NAD. Vital signs reviewed HENT: Normocephalic, Atraumatic Eyes: EOMI. No discharge.  Cardio: RRR. No JVD. Pulm: B/l clear to auscultation.  Effort normal Abd: Soft, BS+ MSK:  Gait WNL.   No TTP diffusely.    No edema.  Neuro: CN II-XII grossly intact.    Sensation intact to light touch in all UE dermatomes  Reflexes 2+ throughout  Strength  5/5 in all UE myotomes Skin: Warm and Dry. Intact    Assessment & Plan:  72 y/o year young male with pmh of Vit D deficiency, OSA, migraines, low testosterone, HTN, presents with burning in face and shoulders.    1. 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 MRI brain relatively unremarkable  Labs reviewed, requested updated lab work up  Side effects with Cymbalta  Referral information reviewed  PMAWARE reviewed  Trail cold with temporary benefit  Will consider PT for neck ROM and cervical traction and TENS  Will consider  referral to Psychology - CBT as rest and no activity seem to exacerbate the pain  Will consider Accupuncture  Will consider steroids  Recommend Lidoderm patches OTC  Will increase Gabapentin to 900 TID  Will consider Vascular/Rheum referral for ?Carotid artery ultrasound and Raynaud like symptoms  ?Horner's like syndrome (delayed pupil relfex, history of ptosis s/p surgery)  Will consider Sympathetic ganglion block/ESIs  Patient states main 2 goals are travel and play with grandchildren  Will order NCS/EMG to assess for cervical nerve related issues  2. Sleep disturbance  See #1  >60   minutes spent with patient and wife in counseling regarding previously tried and potential interventions.  

## 2018-03-21 ENCOUNTER — Encounter: Payer: Medicare Other | Admitting: Physical Medicine & Rehabilitation

## 2018-03-21 ENCOUNTER — Encounter: Payer: Self-pay | Admitting: Physical Medicine & Rehabilitation

## 2018-03-21 VITALS — BP 116/69 | HR 63 | Ht 72.0 in | Wt 188.4 lb

## 2018-03-21 DIAGNOSIS — M792 Neuralgia and neuritis, unspecified: Secondary | ICD-10-CM | POA: Diagnosis not present

## 2018-03-21 DIAGNOSIS — R208 Other disturbances of skin sensation: Secondary | ICD-10-CM | POA: Diagnosis not present

## 2018-03-21 NOTE — Progress Notes (Signed)
See media tab for full results 

## 2018-04-05 ENCOUNTER — Encounter: Payer: Medicare Other | Attending: Physical Medicine & Rehabilitation | Admitting: Physical Medicine & Rehabilitation

## 2018-04-05 ENCOUNTER — Encounter: Payer: Self-pay | Admitting: Physical Medicine & Rehabilitation

## 2018-04-05 ENCOUNTER — Other Ambulatory Visit: Payer: Self-pay

## 2018-04-05 VITALS — BP 120/73 | HR 64 | Ht 72.0 in | Wt 191.0 lb

## 2018-04-05 DIAGNOSIS — G43909 Migraine, unspecified, not intractable, without status migrainosus: Secondary | ICD-10-CM | POA: Diagnosis not present

## 2018-04-05 DIAGNOSIS — G8929 Other chronic pain: Secondary | ICD-10-CM | POA: Insufficient documentation

## 2018-04-05 DIAGNOSIS — I10 Essential (primary) hypertension: Secondary | ICD-10-CM | POA: Diagnosis not present

## 2018-04-05 DIAGNOSIS — G4733 Obstructive sleep apnea (adult) (pediatric): Secondary | ICD-10-CM | POA: Insufficient documentation

## 2018-04-05 DIAGNOSIS — R208 Other disturbances of skin sensation: Secondary | ICD-10-CM | POA: Diagnosis not present

## 2018-04-05 DIAGNOSIS — Z8546 Personal history of malignant neoplasm of prostate: Secondary | ICD-10-CM | POA: Diagnosis not present

## 2018-04-05 DIAGNOSIS — G501 Atypical facial pain: Secondary | ICD-10-CM | POA: Insufficient documentation

## 2018-04-05 DIAGNOSIS — E559 Vitamin D deficiency, unspecified: Secondary | ICD-10-CM | POA: Diagnosis not present

## 2018-04-05 DIAGNOSIS — M792 Neuralgia and neuritis, unspecified: Secondary | ICD-10-CM

## 2018-04-05 DIAGNOSIS — G479 Sleep disorder, unspecified: Secondary | ICD-10-CM

## 2018-04-05 DIAGNOSIS — M4802 Spinal stenosis, cervical region: Secondary | ICD-10-CM | POA: Insufficient documentation

## 2018-04-05 DIAGNOSIS — E785 Hyperlipidemia, unspecified: Secondary | ICD-10-CM | POA: Diagnosis not present

## 2018-04-05 MED ORDER — METHYLPREDNISOLONE 4 MG PO TBPK: ORAL_TABLET | ORAL | 0 refills | Status: DC

## 2018-04-05 MED ORDER — PREGABALIN 100 MG PO CAPS: 100.0000 mg | ORAL_CAPSULE | Freq: Three times a day (TID) | ORAL | 1 refills | Status: DC

## 2018-04-05 NOTE — Progress Notes (Signed)
Subjective:    Patient ID: Evan Robinson, male    DOB: 1946/01/25, 72 y.o.   MRN: [id number]  HPI 72 y/o year young male with pmh of Vit D deficiency, OSA, migraines, low testosterone, HTN, presents with burning in face and shoulders.   Initially stated: Started ~12/2016. He went to IM, Endo, Derm with negative workup. He had lab work for flushing and workup was negative. He saw Neurology, started on Cymbalta and Topomax without benefit. He had brain/C-spine MRI, which showed cervical stenosis. He saw Neurology and was told not radiculopathy related. Progressively getting worse.  Denies alleviating factors. Associated cold hands. Stationary positions exacerbate the pain.  Burning type pain. Radiates to all extremities at times. Denies associated weakness. Denies falls. Wants to travel and grandchildren. Pheochromocytoma workup negative. States he has tried everything. Carotid ultrasound negative per patient.   Last clinic visit 03/21/18. At that time, pt had NCS/EMG.  Reviewed with patient. Since that time, pt states he has not brought in lab work.  No benefit with OTC lidoderm patches, Gabapentin. He notes numbness on right lateral knee as well. Carotid ultrasounds normal per patient. Believes pain is getting worse.   Pain Inventory Average Pain 8 Pain Right Now 8 My pain is burning  In the last 24 hours, has pain interfered with the following? General activity 7 Relation with others 6 Enjoyment of life 8  What TIME of day is your pain at its worst? evening night Sleep (in general) Fair  Pain is worse with: sitting, inactivity and some activites Pain improves with: none Relief from Meds: none  Mobility walk without assistance how many minutes can you walk? unlimited ability to climb steps?  yes do you drive?  yes  Function not employed: date last employed 2008  Neuro/Psych tingling  Prior Studies Any changes since last visit?  no  Physicians involved in your care Primary  care Dr. Arvella Nigh Neurologist Dr. Jacinto Reap   Family History  Problem Relation Age of Onset  . Alcohol abuse Father   . Cancer Father        prostate  . CAD Father        possible MI  . Alcohol abuse Paternal Grandfather   . Stroke Mother   . Diabetes Neg Hx    Social History   Socioeconomic History  . Marital status: Married    Spouse name: Not on file  . Number of children: Not on file  . Years of education: Not on file  . Highest education level: Not on file  Occupational History  . Not on file  Social Needs  . Financial resource strain: Not on file  . Food insecurity:    Worry: Not on file    Inability: Not on file  . Transportation needs:    Medical: Not on file    Non-medical: Not on file  Tobacco Use  . Smoking status: Never Smoker  . Smokeless tobacco: Never Used  Substance and Sexual Activity  . Alcohol use: No  . Drug use: No  . Sexual activity: Not on file  Lifestyle  . Physical activity:    Days per week: Not on file    Minutes per session: Not on file  . Stress: Not on file  Relationships  . Social connections:    Talks on phone: Not on file    Gets together: Not on file    Attends religious service: Not on file    Active member of club or organization: Not  on file    Attends meetings of clubs or organizations: Not on file    Relationship status: Not on file  Other Topics Concern  . Not on file  Social History Narrative   "Gene"   Lives with wife   Occupation: retired, was Pharmacist, hospital   Activity: walks daily, some gym   Diet:    Past Surgical History:  Procedure Laterality Date  . CHOLECYSTECTOMY  2006  . COLONOSCOPY WITH PROPOFOL N/A 02/01/2017   Procedure: COLONOSCOPY WITH PROPOFOL;  Surgeon: Toledo, Benay Pike, MD;  Location: [facility];  Service: Gastroenterology;  Laterality: N/A;  . ESOPHAGOGASTRODUODENOSCOPY (EGD) WITH PROPOFOL N/A 02/01/2017   Procedure: ESOPHAGOGASTRODUODENOSCOPY (EGD) WITH PROPOFOL;  Surgeon: Toledo, Benay Pike, MD;   Location: [facility];  Service: Gastroenterology;  Laterality: N/A;  . HERNIA REPAIR  08/2010  . PROSTATECTOMY  2003   Past Medical History:  Diagnosis Date  . Dyslipidemia   . H/O prostate cancer 2003   s/p radical prostatectomy (uro Dr. Rush Farmer in [place] yearly)  . Headache 2012   thorough w/u - unrevealing.  has seen 3 neurologists, MRI, LP nonacute.  ESR = 3  . HTN (hypertension)   . Hypogonadism male   . Low testosterone   . Migraines   . OSA on CPAP   . 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 Vitamin D deficiency    BP 120/73   Pulse 64   Ht 6' (1.829 m)   Wt 191 lb (86.6 kg)   SpO2 98%   BMI 25.90 kg/m   Opioid Risk Score:   Fall Risk Score:  `1  Depression screen PHQ 2/9  Depression screen PHQ 2/9 04/05/2018  Decreased Interest 0  Down, Depressed, Hopeless 0  PHQ - 2 Score 0     Review of Systems  Constitutional: Positive for appetite change and unexpected weight change.  HENT: Negative.   Eyes: Negative.   Respiratory: Positive for apnea.   Cardiovascular: Negative.   Gastrointestinal: Negative.   Endocrine: Negative.   Genitourinary: Negative.   Musculoskeletal: Negative.   Skin: Positive for color change.  Allergic/Immunologic: Negative.   Neurological: Negative.        Burning  Hematological: Negative.   Psychiatric/Behavioral: Negative.   All other systems reviewed and are negative.     Objective:   Physical Exam Gen: NAD. Vital signs reviewed HENT: Normocephalic, Atraumatic Eyes: EOMI. No discharge.  Cardio: RRR. No JVD. Pulm: B/l clear to auscultation.  Effort normal. Abd: Nondistended. BS+ MSK:  Gait WNL.   No TTP diffusely.    No edema.  Neuro: Sensation intact to light touch in all UE dermatomes  Strength  5/5 in all UE myotomes Skin: Warm and Dry. Intact    Assessment & Plan:  72 y/o year young male with pmh of Vit D deficiency, OSA, migraines, low testosterone, HTN, presents  with burning in face and shoulders.    1. 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   Lidoderm patch, Gabapentin without benefit  Labs reviewed, requested updated lab work up, reminded  again  Side effects with Cymbalta  Will consider PT for neck ROM and cervical traction and TENS  Will consider referral to Psychology - CBT  as rest and no activity seem to exacerbate the pain  Will consider Accupuncture  Will consider Vascular/Rheum referral for Raynaud like symptoms  ?Horner's like syndrome (delayed pupil relfex, history of ptosis s/p surgery)  Will consider Sympathetic ganglion block/ESIs  Patient states main 2 goals are travel and play with grandchildren  Will order steroid dose pack  Will order Lyrica 100 TID   2. Sleep disturbance  See #1  >40 minutes spent with patient and wife in counseling regarding previously tried and potential interventions.

## 2018-05-04 ENCOUNTER — Ambulatory Visit: Payer: Medicare Other | Admitting: Physical Medicine & Rehabilitation

## 2018-05-09 ENCOUNTER — Encounter: Payer: Medicare Other | Attending: Physical Medicine & Rehabilitation | Admitting: Physical Medicine & Rehabilitation

## 2018-05-09 ENCOUNTER — Encounter: Payer: Self-pay | Admitting: Physical Medicine & Rehabilitation

## 2018-05-09 ENCOUNTER — Other Ambulatory Visit: Payer: Self-pay

## 2018-05-09 VITALS — BP 145/75 | HR 62 | Ht 72.0 in | Wt 196.6 lb

## 2018-05-09 DIAGNOSIS — G8929 Other chronic pain: Secondary | ICD-10-CM | POA: Diagnosis not present

## 2018-05-09 DIAGNOSIS — R208 Other disturbances of skin sensation: Secondary | ICD-10-CM | POA: Diagnosis not present

## 2018-05-09 DIAGNOSIS — I10 Essential (primary) hypertension: Secondary | ICD-10-CM | POA: Insufficient documentation

## 2018-05-09 DIAGNOSIS — G501 Atypical facial pain: Secondary | ICD-10-CM

## 2018-05-09 DIAGNOSIS — G479 Sleep disorder, unspecified: Secondary | ICD-10-CM

## 2018-05-09 DIAGNOSIS — E785 Hyperlipidemia, unspecified: Secondary | ICD-10-CM | POA: Insufficient documentation

## 2018-05-09 DIAGNOSIS — G4733 Obstructive sleep apnea (adult) (pediatric): Secondary | ICD-10-CM | POA: Insufficient documentation

## 2018-05-09 DIAGNOSIS — G43909 Migraine, unspecified, not intractable, without status migrainosus: Secondary | ICD-10-CM | POA: Insufficient documentation

## 2018-05-09 DIAGNOSIS — E559 Vitamin D deficiency, unspecified: Secondary | ICD-10-CM | POA: Diagnosis not present

## 2018-05-09 DIAGNOSIS — M792 Neuralgia and neuritis, unspecified: Secondary | ICD-10-CM

## 2018-05-09 DIAGNOSIS — M4802 Spinal stenosis, cervical region: Secondary | ICD-10-CM | POA: Diagnosis not present

## 2018-05-09 DIAGNOSIS — Z8546 Personal history of malignant neoplasm of prostate: Secondary | ICD-10-CM | POA: Diagnosis not present

## 2018-05-09 MED ORDER — PREGABALIN 200 MG PO CAPS: 200.0000 mg | ORAL_CAPSULE | Freq: Two times a day (BID) | ORAL | 1 refills | Status: DC

## 2018-05-09 MED ORDER — CAPSAICIN 0.1 % EX CREA: TOPICAL_CREAM | CUTANEOUS | 1 refills | Status: DC

## 2018-05-09 NOTE — Progress Notes (Addendum)
Subjective:    Patient ID: Evan Robinson, male    DOB: [birth year], 73 y.o.   MRN: [id number]  HPI 73 y/o year young male with pmh of Vit D deficiency, OSA, migraines, low testosterone, HTN, presents with burning in face and shoulders.   Initially stated: Started ~12/2016. He went to IM, Endo, Derm with negative workup. He had lab work for flushing and workup was negative. He saw Neurology, started on Cymbalta and Topomax without benefit. He had brain/C-spine MRI, which showed cervical stenosis. He saw Neurology and was told not radiculopathy related. Progressively getting worse.  Denies alleviating factors. Associated cold hands. Stationary positions exacerbate the pain.  Burning type pain. Radiates to all extremities at times. Denies associated weakness. Denies falls. Wants to travel and grandchildren. Pheochromocytoma workup negative. States he has tried everything. Carotid ultrasound negative per patient.   Last clinic visit 04/05/18. Since that time, pt states he forgot to bring in lab work again. He states no benefit with steroids, infarct, it made it worse for 1 day.  He notes benefit with Lyrica at times, wife believes >25% benefit.  Pain Inventory Average Pain 9 Pain Right Now 7 My pain is burning and tingling  In the last 24 hours, has pain interfered with the following? General activity 7 Relation with others 7 Enjoyment of life 8  What TIME of day is your pain at its worst? evening night Sleep (in general) Good  Pain is worse with: sitting Pain improves with: none Relief from Meds: 3  Mobility walk without assistance how many minutes can you walk? unlimited ability to climb steps?  yes do you drive?  yes  Function not employed: date last employed 2008  Neuro/Psych No problems in this area  Prior Studies Any changes since last visit?  no  Physicians involved in your care Any changes since last visit?  no   Family History  Problem Relation Age of Onset  .  Alcohol abuse Father   . Cancer Father        prostate  . CAD Father        possible MI  . Alcohol abuse Paternal Grandfather   . Stroke Mother   . Diabetes Neg Hx    Social History   Socioeconomic History  . Marital status: Married    Spouse name: Not on file  . Number of children: Not on file  . Years of education: Not on file  . Highest education level: Not on file  Occupational History  . Not on file  Social Needs  . Financial resource strain: Not on file  . Food insecurity:    Worry: Not on file    Inability: Not on file  . Transportation needs:    Medical: Not on file    Non-medical: Not on file  Tobacco Use  . Smoking status: Never Smoker  . Smokeless tobacco: Never Used  Substance and Sexual Activity  . Alcohol use: No  . Drug use: No  . Sexual activity: Not on file  Lifestyle  . Physical activity:    Days per week: Not on file    Minutes per session: Not on file  . Stress: Not on file  Relationships  . Social connections:    Talks on phone: Not on file    Gets together: Not on file    Attends religious service: Not on file    Active member of club or organization: Not on file    Attends meetings of clubs  or organizations: Not on file    Relationship status: Not on file  Other Topics Concern  . Not on file  Social History Narrative   "Gene"   Lives with wife   Occupation: retired, was Pharmacist, hospital   Activity: walks daily, some gym   Diet:    Past Surgical History:  Procedure Laterality Date  . CHOLECYSTECTOMY  2006  . COLONOSCOPY WITH PROPOFOL N/A 02/01/2017   Procedure: COLONOSCOPY WITH PROPOFOL;  Surgeon: Toledo, Benay Pike, MD;  Location: [facility];  Service: Gastroenterology;  Laterality: N/A;  . ESOPHAGOGASTRODUODENOSCOPY (EGD) WITH PROPOFOL N/A 02/01/2017   Procedure: ESOPHAGOGASTRODUODENOSCOPY (EGD) WITH PROPOFOL;  Surgeon: Toledo, Benay Pike, MD;  Location: [facility];  Service: Gastroenterology;  Laterality: N/A;  . HERNIA REPAIR   08/2010  . PROSTATECTOMY  2003   Past Medical History:  Diagnosis Date  . Dyslipidemia   . H/O prostate cancer 2003   s/p radical prostatectomy (uro Dr. Rush Farmer in [place] yearly)  . Headache 2012   thorough w/u - unrevealing.  has seen 3 neurologists, MRI, LP nonacute.  ESR = 3  . HTN (hypertension)   . Hypogonadism male   . Low testosterone   . Migraines   . OSA on CPAP   . 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 Vitamin D deficiency    BP (!) 145/75   Pulse 62   Ht 6' (1.829 m)   Wt 196 lb 9.6 oz (89.2 kg)   SpO2 96%   BMI 26.66 kg/m   Opioid Risk Score:   Fall Risk Score:  `1  Depression screen PHQ 2/9  Depression screen [facility] 2/9 05/09/2018 04/05/2018  Decreased Interest 0 0  Down, Depressed, Hopeless 0 0  PHQ - 2 Score 0 0     Review of Systems  Constitutional: Positive for appetite change and unexpected weight change.  HENT: Negative.   Eyes: Negative.   Respiratory: Negative.  Negative for apnea.   Cardiovascular: Negative.   Gastrointestinal: Positive for constipation.  Endocrine: Negative.   Genitourinary: Negative.   Musculoskeletal: Negative.   Skin: Positive for color change.  Allergic/Immunologic: Negative.   Neurological: Negative.        Burning  Hematological: Negative.   Psychiatric/Behavioral: Negative.   All other systems reviewed and are negative.     Objective:   Physical Exam Gen: NAD. Vital signs reviewed HENT: Normocephalic, Atraumatic Eyes: EOMI. No discharge.  Cardio: RRR. No JVD. Pulm: B/l clear to auscultation.  Effort normal. Abd: Nondistended. BS+ MSK:  Gait WNL.   No TTP diffusely.    No edema.  Neuro:  Strength  5/5 in all UE myotomes Skin: Warm and Dry. Intact    Assessment & Plan:  73 y/o year young male with pmh of Vit D deficiency, OSA, migraines, low testosterone, HTN, presents with burning in face and shoulders.    1. Chronic facial pain with flushing and  burning  MRI from 10/2017 reviewed, revealing cervical cord compression. 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   Lidoderm patch, Gabapentin without benefit  Steroids made symptoms more severe  Labs reviewed, requested updated lab work up, reminded again  Side effects with Cymbalta  Will consider PT for neck ROM and cervical traction and TENS  Will consider referral to Psychology - CBT as rest and  no activity seem to exacerbate the pain  Will consider Accupuncture  Will consider Vascular/Rheum referral for Raynaud like symptoms  ?Horner's like syndrome (delayed pupil relfex, history of ptosis s/p surgery)  Will consider Sympathetic ganglion block/ESIs  Patient states main 2 goals are travel and play with grandchildren  Will increase Lyrica to 200 TID   Will consider trigger point injections  Will consider lidocaine patch  Will consider Lidocaine infusion  Trail of Zyrtec (patient has some at home)  Will order Capsacin cream  Will discuss antiinflamatory diet in future  2. Sleep disturbance  See #1  >30 minutes spent with patient and wife in counseling regarding potential interventions.

## 2018-06-06 ENCOUNTER — Encounter: Payer: Medicare Other | Admitting: Physical Medicine & Rehabilitation

## 2018-06-20 ENCOUNTER — Encounter: Payer: Self-pay | Admitting: Physical Medicine & Rehabilitation

## 2018-06-20 ENCOUNTER — Encounter: Payer: Medicare Other | Attending: Physical Medicine & Rehabilitation | Admitting: Physical Medicine & Rehabilitation

## 2018-06-20 ENCOUNTER — Other Ambulatory Visit: Payer: Self-pay | Admitting: Physical Medicine & Rehabilitation

## 2018-06-20 ENCOUNTER — Other Ambulatory Visit: Payer: Self-pay

## 2018-06-20 VITALS — BP 108/70 | HR 66 | Ht 72.0 in | Wt 200.0 lb

## 2018-06-20 DIAGNOSIS — E538 Deficiency of other specified B group vitamins: Secondary | ICD-10-CM

## 2018-06-20 DIAGNOSIS — G8929 Other chronic pain: Secondary | ICD-10-CM | POA: Insufficient documentation

## 2018-06-20 DIAGNOSIS — E785 Hyperlipidemia, unspecified: Secondary | ICD-10-CM | POA: Diagnosis not present

## 2018-06-20 DIAGNOSIS — E559 Vitamin D deficiency, unspecified: Secondary | ICD-10-CM | POA: Diagnosis not present

## 2018-06-20 DIAGNOSIS — G479 Sleep disorder, unspecified: Secondary | ICD-10-CM | POA: Diagnosis not present

## 2018-06-20 DIAGNOSIS — I10 Essential (primary) hypertension: Secondary | ICD-10-CM | POA: Insufficient documentation

## 2018-06-20 DIAGNOSIS — M4802 Spinal stenosis, cervical region: Secondary | ICD-10-CM | POA: Diagnosis not present

## 2018-06-20 DIAGNOSIS — R208 Other disturbances of skin sensation: Secondary | ICD-10-CM | POA: Diagnosis not present

## 2018-06-20 DIAGNOSIS — G4733 Obstructive sleep apnea (adult) (pediatric): Secondary | ICD-10-CM | POA: Insufficient documentation

## 2018-06-20 DIAGNOSIS — G501 Atypical facial pain: Secondary | ICD-10-CM

## 2018-06-20 DIAGNOSIS — G43909 Migraine, unspecified, not intractable, without status migrainosus: Secondary | ICD-10-CM | POA: Diagnosis not present

## 2018-06-20 DIAGNOSIS — M792 Neuralgia and neuritis, unspecified: Secondary | ICD-10-CM | POA: Diagnosis not present

## 2018-06-20 DIAGNOSIS — Z8546 Personal history of malignant neoplasm of prostate: Secondary | ICD-10-CM | POA: Diagnosis not present

## 2018-06-20 MED ORDER — VITAMIN D 50 MCG (2000 UT) PO TABS: 2000.0000 [IU] | ORAL_TABLET | Freq: Every day | ORAL | 1 refills | Status: DC

## 2018-06-20 MED ORDER — VITAMIN B-12 1000 MCG PO TABS: 1000.0000 ug | ORAL_TABLET | Freq: Every day | ORAL | 1 refills | Status: DC

## 2018-06-20 MED ORDER — PREGABALIN 100 MG PO CAPS: 100.0000 mg | ORAL_CAPSULE | Freq: Two times a day (BID) | ORAL | 1 refills | Status: DC

## 2018-06-20 MED ORDER — TOPIRAMATE 25 MG PO TABS: 25.0000 mg | ORAL_TABLET | Freq: Two times a day (BID) | ORAL | 1 refills | Status: DC

## 2018-06-20 NOTE — Progress Notes (Signed)
Subjective:    Patient ID: Evan Robinson, male    DOB: 1946-02-15, 73 y.o.   MRN: [id number]  HPI 73 y/o year young male with pmh of Vit D deficiency, OSA, migraines, low testosterone, HTN, presents with burning in face and shoulders.   Initially stated: Started ~12/2016. He went to IM, Endo, Derm with negative workup. He had lab work for flushing and workup was negative. He saw Neurology, started on Cymbalta and Topomax without benefit. He had brain/C-spine MRI, which showed cervical stenosis. He saw Neurology and was told not radiculopathy related. Progressively getting worse.  Denies alleviating factors. Associated cold hands. Stationary positions exacerbate the pain.  Burning type pain. Radiates to all extremities at times. Denies associated weakness. Denies falls. Wants to travel and grandchildren. Pheochromocytoma workup negative. States he has tried everything. Carotid ultrasound negative per patient.   Last clinic visit 05/09/2018. Since that time, pt has lab work, reviewed. Notes reviewed from Neuro and PCP in [place]. He states he has side effects with increased dose of Lyrica. He started taking previous prescription of Topomax. He did not try Zyrtec. He tried Capsasin, but only initially.    Pain Inventory Average Pain 9 Pain Right Now 8 My pain is burning and tingling  In the last 24 hours, has pain interfered with the following? General activity 7 Relation with others 7 Enjoyment of life 7  What TIME of day is your pain at its worst? daytime evening night Sleep (in general) Good  Pain is worse with: sitting Pain improves with: none Relief from Meds: 3  Mobility walk without assistance how many minutes can you walk? unlimited ability to climb steps?  yes do you drive?  yes  Function not employed: date last employed 2008  Neuro/Psych No problems in this area  Prior Studies Any changes since last visit?  no  Physicians involved in your care Any changes since last  visit?  no   Family History  Problem Relation Age of Onset  . Alcohol abuse Father   . Cancer Father        prostate  . CAD Father        possible MI  . Alcohol abuse Paternal Grandfather   . Stroke Mother   . Diabetes Neg Hx    Social History   Socioeconomic History  . Marital status: Married    Spouse name: Not on file  . Number of children: Not on file  . Years of education: Not on file  . Highest education level: Not on file  Occupational History  . Not on file  Social Needs  . Financial resource strain: Not on file  . Food insecurity:    Worry: Not on file    Inability: Not on file  . Transportation needs:    Medical: Not on file    Non-medical: Not on file  Tobacco Use  . Smoking status: Never Smoker  . Smokeless tobacco: Never Used  Substance and Sexual Activity  . Alcohol use: No  . Drug use: No  . Sexual activity: Not on file  Lifestyle  . Physical activity:    Days per week: Not on file    Minutes per session: Not on file  . Stress: Not on file  Relationships  . Social connections:    Talks on phone: Not on file    Gets together: Not on file    Attends religious service: Not on file    Active member of club or organization: Not  on file    Attends meetings of clubs or organizations: Not on file    Relationship status: Not on file  Other Topics Concern  . Not on file  Social History Narrative   "Evan Robinson"   Lives with wife   Occupation: retired, was Pharmacist, hospital   Activity: walks daily, some gym   Diet:    Past Surgical History:  Procedure Laterality Date  . CHOLECYSTECTOMY  2006  . COLONOSCOPY WITH PROPOFOL N/A 02/01/2017   Procedure: COLONOSCOPY WITH PROPOFOL;  Surgeon: Toledo, Benay Pike, MD;  Location: [facility] ENDOSCOPY;  Service: Gastroenterology;  Laterality: N/A;  . ESOPHAGOGASTRODUODENOSCOPY (EGD) WITH PROPOFOL N/A 02/01/2017   Procedure: ESOPHAGOGASTRODUODENOSCOPY (EGD) WITH PROPOFOL;  Surgeon: Toledo, Benay Pike, MD;  Location: [facility]  ENDOSCOPY;  Service: Gastroenterology;  Laterality: N/A;  . HERNIA REPAIR  08/2010  . PROSTATECTOMY  2003   Past Medical History:  Diagnosis Date  . Dyslipidemia   . H/O prostate cancer 2003   s/p radical prostatectomy (uro Dr. Rush Farmer in [place] yearly)  . Headache 2012   thorough w/u - unrevealing.  has seen 3 neurologists, MRI, LP nonacute.  ESR = 3  . HTN (hypertension)   . Hypogonadism male   . Low testosterone   . Migraines   . OSA on CPAP   . 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 Vitamin D deficiency    BP 108/70   Pulse 66   Ht 6' (1.829 m)   Wt 200 lb (90.7 kg)   SpO2 97%   BMI 27.12 kg/m   Opioid Risk Score:   Fall Risk Score:  `1  Depression screen PHQ 2/9  Depression screen [facility] 2/9 06/20/2018 05/09/2018 04/05/2018  Decreased Interest 0 0 0  Down, Depressed, Hopeless 0 0 0  PHQ - 2 Score 0 0 0     Review of Systems  Constitutional: Positive for appetite change and unexpected weight change.  HENT: Negative.   Eyes: Negative.   Respiratory: Negative.  Negative for apnea.   Cardiovascular: Negative.   Gastrointestinal: Positive for constipation.  Endocrine: Negative.   Genitourinary: Negative.   Musculoskeletal: Negative.   Skin: Positive for color change.  Allergic/Immunologic: Negative.   Neurological: Negative.        Burning  Hematological: Negative.   Psychiatric/Behavioral: Negative.   All other systems reviewed and are negative.     Objective:   Physical Exam Gen: NAD. Vital signs reviewed HENT: Normocephalic, Atraumatic Eyes: EOMI. No discharge.  Cardio: RRR. No JVD. Pulm: B/l clear to auscultation.  Effort normal. Abd: Nondistended. BS+ MSK:  Gait WNL.   No TTP diffusely.    No edema.  Neuro:  Strength  5/5 in all UE myotomes Skin: Warm and Dry. Intact    Assessment & Plan:  73 y/o year young male with pmh of Vit D deficiency, OSA, migraines, low testosterone, HTN, presents with burning  in face and shoulders.    1. 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   Lidoderm patch, Gabapentin without benefit  Steroids made symptoms more severe  Labs reviewed  Side effects with Cymbalta  Will consider PT for neck ROM and cervical traction and TENS  Will consider referral to Psychology - CBT  as rest and no activity seem to exacerbate the pain  Will consider Accupuncture  Will consider Vascular/Rheum referral for Raynaud like symptoms  ?Horner's like syndrome (delayed pupil relfex, history of ptosis s/p surgery)  Will consider Sympathetic ganglion block/ESIs  Patient states main 2 goals are travel and play with grandchildren  Not able to tolerate 282m Lyrica 100 TID ordered  Will order Topomax 25 BID  Will consider Lidoderm patch  Trial Capsacin cream again  Encouraged Mediterranean diet  Trial Zyrtec again  Will consider trigger point injections  Will consider Lidocaine infusion  2. Sleep disturbance  See #1  3. Vitamin B12 deficiency  Level 278 on 06/2018, supplement ordered  4. Vitamin D  Level 43 06/2018, however, stopping taking supplement  Supplement ordered  >40 minutes spent with patient and wife in counseling regarding past and potential interventions.

## 2018-07-13 ENCOUNTER — Encounter: Payer: Self-pay | Admitting: Physical Medicine & Rehabilitation

## 2018-07-13 ENCOUNTER — Encounter: Payer: Medicare Other | Attending: Physical Medicine & Rehabilitation | Admitting: Physical Medicine & Rehabilitation

## 2018-07-13 ENCOUNTER — Other Ambulatory Visit: Payer: Self-pay

## 2018-07-13 VITALS — BP 131/79 | HR 60 | Ht 72.0 in | Wt 201.4 lb

## 2018-07-13 DIAGNOSIS — E559 Vitamin D deficiency, unspecified: Secondary | ICD-10-CM

## 2018-07-13 DIAGNOSIS — M792 Neuralgia and neuritis, unspecified: Secondary | ICD-10-CM | POA: Diagnosis not present

## 2018-07-13 DIAGNOSIS — G479 Sleep disorder, unspecified: Secondary | ICD-10-CM

## 2018-07-13 DIAGNOSIS — G501 Atypical facial pain: Secondary | ICD-10-CM

## 2018-07-13 DIAGNOSIS — G4733 Obstructive sleep apnea (adult) (pediatric): Secondary | ICD-10-CM | POA: Diagnosis not present

## 2018-07-13 DIAGNOSIS — E538 Deficiency of other specified B group vitamins: Secondary | ICD-10-CM

## 2018-07-13 DIAGNOSIS — G43909 Migraine, unspecified, not intractable, without status migrainosus: Secondary | ICD-10-CM | POA: Diagnosis not present

## 2018-07-13 DIAGNOSIS — M4802 Spinal stenosis, cervical region: Secondary | ICD-10-CM | POA: Diagnosis not present

## 2018-07-13 DIAGNOSIS — Z8546 Personal history of malignant neoplasm of prostate: Secondary | ICD-10-CM | POA: Diagnosis not present

## 2018-07-13 DIAGNOSIS — E785 Hyperlipidemia, unspecified: Secondary | ICD-10-CM | POA: Diagnosis not present

## 2018-07-13 DIAGNOSIS — G8929 Other chronic pain: Secondary | ICD-10-CM | POA: Diagnosis not present

## 2018-07-13 DIAGNOSIS — R208 Other disturbances of skin sensation: Secondary | ICD-10-CM | POA: Diagnosis not present

## 2018-07-13 DIAGNOSIS — I10 Essential (primary) hypertension: Secondary | ICD-10-CM | POA: Diagnosis not present

## 2018-07-13 MED ORDER — PREGABALIN 200 MG PO CAPS: 200.0000 mg | ORAL_CAPSULE | Freq: Three times a day (TID) | ORAL | 2 refills | Status: DC

## 2018-07-13 MED ORDER — PREGABALIN 100 MG PO CAPS
100.0000 mg | ORAL_CAPSULE | Freq: Two times a day (BID) | ORAL | 2 refills | Status: DC
Start: 2018-07-13 — End: 2018-08-13

## 2018-07-13 NOTE — Progress Notes (Signed)
Subjective:    Patient ID: Evan Robinson, male    DOB: 1945/05/15, 73 y.o.   MRN: [id number]  HPI 73 y/o year young male with pmh of Vit D deficiency, OSA, migraines, low testosterone, HTN, presents with burning in face and shoulders.   Initially stated: Started ~12/2016. He went to IM, Endo, Derm with negative workup. He had lab work for flushing and workup was negative. He saw Neurology, started on Cymbalta and Topomax without benefit. He had brain/C-spine MRI, which showed cervical stenosis. He saw Neurology and was told not radiculopathy related. Progressively getting worse.  Denies alleviating factors. Associated cold hands. Stationary positions exacerbate the pain.  Burning type pain. Radiates to all extremities at times. Denies associated weakness. Denies falls. Wants to travel and grandchildren. Pheochromocytoma workup negative. States he has tried everything. Carotid ultrasound negative per patient.   Last clinic visit 06/20/2018. Since that time, pt is taking Lyrica 200 morning, 100 in afternoon and 100-200 evening. No benefit with Topamax. He has not tried Capsacin cream. No benefit with Zyrtec. Some benefit with Mediterranean diet. Taking Vit B12 and D. In general, he is relatively stable.   Pain Inventory Average Pain 9 Pain Right Now 8 My pain is burning and tingling  In the last 24 hours, has pain interfered with the following? General activity 8 Relation with others 8 Enjoyment of life 8  What TIME of day is your pain at its worst? daytime evening night Sleep (in general) Good  Pain is worse with: sitting Pain improves with: none Relief from Meds: 3  Mobility walk without assistance how many minutes can you walk? unlimited ability to climb steps?  yes do you drive?  yes  Function not employed: date last employed 2008  Neuro/Psych No problems in this area  Prior Studies Any changes since last visit?  no  Physicians involved in your care Any changes since last  visit?  no   Family History  Problem Relation Age of Onset  . Alcohol abuse Father   . Cancer Father        prostate  . CAD Father        possible MI  . Alcohol abuse Paternal Grandfather   . Stroke Mother   . Diabetes Neg Hx    Social History   Socioeconomic History  . Marital status: Married    Spouse name: Not on file  . Number of children: Not on file  . Years of education: Not on file  . Highest education level: Not on file  Occupational History  . Not on file  Social Needs  . Financial resource strain: Not on file  . Food insecurity:    Worry: Not on file    Inability: Not on file  . Transportation needs:    Medical: Not on file    Non-medical: Not on file  Tobacco Use  . Smoking status: Never Smoker  . Smokeless tobacco: Never Used  Substance and Sexual Activity  . Alcohol use: No  . Drug use: No  . Sexual activity: Not on file  Lifestyle  . Physical activity:    Days per week: Not on file    Minutes per session: Not on file  . Stress: Not on file  Relationships  . Social connections:    Talks on phone: Not on file    Gets together: Not on file    Attends religious service: Not on file    Active member of club or organization: Not on  file    Attends meetings of clubs or organizations: Not on file    Relationship status: Not on file  Other Topics Concern  . Not on file  Social History Narrative   "Gene"   Lives with wife   Occupation: retired, was Pharmacist, hospital   Activity: walks daily, some gym   Diet:    Past Surgical History:  Procedure Laterality Date  . CHOLECYSTECTOMY  2006  . COLONOSCOPY WITH PROPOFOL N/A 02/01/2017   Procedure: COLONOSCOPY WITH PROPOFOL;  Surgeon: Toledo, Benay Pike, MD;  Location: [facility] ENDOSCOPY;  Service: Gastroenterology;  Laterality: N/A;  . ESOPHAGOGASTRODUODENOSCOPY (EGD) WITH PROPOFOL N/A 02/01/2017   Procedure: ESOPHAGOGASTRODUODENOSCOPY (EGD) WITH PROPOFOL;  Surgeon: Toledo, Benay Pike, MD;  Location: [facility]  ENDOSCOPY;  Service: Gastroenterology;  Laterality: N/A;  . HERNIA REPAIR  08/2010  . PROSTATECTOMY  2003   Past Medical History:  Diagnosis Date  . Dyslipidemia   . H/O prostate cancer 2003   s/p radical prostatectomy (uro Dr. Rush Farmer in [place] yearly)  . Headache 2012   thorough w/u - unrevealing.  has seen 3 neurologists, MRI, LP nonacute.  ESR = 3  . HTN (hypertension)   . Hypogonadism male   . Low testosterone   . Migraines   . OSA on CPAP   . 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 Vitamin D deficiency    BP 131/79   Pulse 60   Ht 6' (1.829 m)   Wt 201 lb 6.4 oz (91.4 kg)   SpO2 98%   BMI 27.31 kg/m   Opioid Risk Score:   Fall Risk Score:  `1  Depression screen PHQ 2/9  Depression screen [facility] 2/9 07/13/2018 06/20/2018 05/09/2018 04/05/2018  Decreased Interest 0 0 0 0  Down, Depressed, Hopeless 0 0 0 0  PHQ - 2 Score 0 0 0 0     Review of Systems  Constitutional: Positive for appetite change and unexpected weight change.  HENT: Negative.   Eyes: Negative.   Respiratory: Negative.  Negative for apnea.   Cardiovascular: Negative.   Gastrointestinal: Positive for constipation.  Endocrine: Negative.   Genitourinary: Negative.   Musculoskeletal: Negative.   Skin: Positive for color change.  Allergic/Immunologic: Negative.   Neurological: Negative.        Burning  Hematological: Negative.   Psychiatric/Behavioral: Negative.   All other systems reviewed and are negative.     Objective:   Physical Exam Gen: NAD. Vital signs reviewed HENT: Normocephalic, Atraumatic Eyes: EOMI. No discharge.  Cardio: RRR. No JVD. Pulm: B/l clear to auscultation.  Effort normal. Abd: Nondistended. BS+ MSK:  Gait WNL.   No TTP diffusely.    No edema.  Neuro:  Strength  5/5 in all UE myotomes Skin: Warm and Dry. Intact    Assessment & Plan:  73 y/o year young male with pmh of Vit D deficiency, OSA, migraines, low testosterone, HTN,  presents with burning in face and shoulders.    1. 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   Lidoderm patch, Zyrtec, Tomapax without benefit  Steroids made symptoms more severe  Labs reviewed  Side effects with Cymbalta  Will consider PT for neck ROM and cervical traction and TENS  Will  consider referral to Psychology - CBT as rest and no activity seem to exacerbate the pain  Will consider Accupuncture  Will consider Vascular/Rheum referral for Raynaud like symptoms  ?Horner's like syndrome (delayed pupil relfex, history of ptosis s/p surgery)  Will consider Sympathetic ganglion block/ESIs  Patient states main 2 goals are travel and play with grandchildren  Cont 100-200 mg Lyrica TID  Trial Capsacin cream again, reminded again  Encouraged Mediterranean diet  Will consider trigger point injections  Will consider Lidocaine infusion  Will consider Gabapentin at higher doses  He is playing with his grandchildren more  2. Sleep disturbance  See #1  3. Vitamin B12 deficiency  Level 278 on 06/2018, Cont supplement   4. Vitamin D  Level 43 06/2018, cont supplement ordered

## 2018-08-13 ENCOUNTER — Encounter: Payer: Medicare Other | Attending: Physical Medicine & Rehabilitation | Admitting: Physical Medicine & Rehabilitation

## 2018-08-13 ENCOUNTER — Encounter: Payer: Self-pay | Admitting: Physical Medicine & Rehabilitation

## 2018-08-13 ENCOUNTER — Other Ambulatory Visit: Payer: Self-pay

## 2018-08-13 DIAGNOSIS — M792 Neuralgia and neuritis, unspecified: Secondary | ICD-10-CM

## 2018-08-13 DIAGNOSIS — G4733 Obstructive sleep apnea (adult) (pediatric): Secondary | ICD-10-CM | POA: Insufficient documentation

## 2018-08-13 DIAGNOSIS — G479 Sleep disorder, unspecified: Secondary | ICD-10-CM

## 2018-08-13 DIAGNOSIS — G43909 Migraine, unspecified, not intractable, without status migrainosus: Secondary | ICD-10-CM | POA: Insufficient documentation

## 2018-08-13 DIAGNOSIS — E785 Hyperlipidemia, unspecified: Secondary | ICD-10-CM | POA: Insufficient documentation

## 2018-08-13 DIAGNOSIS — E559 Vitamin D deficiency, unspecified: Secondary | ICD-10-CM | POA: Insufficient documentation

## 2018-08-13 DIAGNOSIS — G501 Atypical facial pain: Secondary | ICD-10-CM

## 2018-08-13 DIAGNOSIS — R208 Other disturbances of skin sensation: Secondary | ICD-10-CM | POA: Insufficient documentation

## 2018-08-13 DIAGNOSIS — Z8546 Personal history of malignant neoplasm of prostate: Secondary | ICD-10-CM | POA: Insufficient documentation

## 2018-08-13 DIAGNOSIS — I10 Essential (primary) hypertension: Secondary | ICD-10-CM | POA: Insufficient documentation

## 2018-08-13 DIAGNOSIS — M4802 Spinal stenosis, cervical region: Secondary | ICD-10-CM | POA: Insufficient documentation

## 2018-08-13 DIAGNOSIS — E538 Deficiency of other specified B group vitamins: Secondary | ICD-10-CM

## 2018-08-13 DIAGNOSIS — G8929 Other chronic pain: Secondary | ICD-10-CM | POA: Insufficient documentation

## 2018-08-13 NOTE — Progress Notes (Signed)
Subjective:    Patient ID: Evan Robinson, male    DOB: 1946-01-12, 73 y.o.   MRN: [id number]  TELEHEALTH NOTE  Due to national recommendations of social distancing due to COVID 19, an audio/video telehealth visit is felt to be most appropriate for this patient at this time.  See Chart message from today for the patient's consent to telehealth from [place].     I verified that I am speaking with the correct person using two identifiers.  Location of patient: Home Location of provider: Home Method of communication: Webex Names of participants : Evan Robinson scheduling, Evan Robinson obtaining consent and vitals if available Established patient Time spent on call: 25 minutes  HPI  73 y/o year young male with pmh of Vit D deficiency, OSA, migraines, low testosterone, HTN, presents with burning in face and shoulders.   Initially stated: Started ~12/2016. He went to IM, Endo, Derm with negative workup. He had lab work for flushing and workup was negative. He saw Neurology, started on Cymbalta and Topomax without benefit. He had brain/C-spine MRI, which showed cervical stenosis. He saw Neurology and was told not radiculopathy related. Progressively getting worse.  Denies alleviating factors. Associated cold hands. Stationary positions exacerbate the pain.  Burning type pain. Radiates to all extremities at times. Denies associated weakness. Denies falls. Wants to travel and grandchildren. Pheochromocytoma workup negative. States he has tried everything. Carotid ultrasound negative per patient.   Last clinic visit 07/13/2018. Since that time, patient notes more consistent pain. He states the burning is all the time now. He is taking 200 TID. He states he has not tried capsacin again.   Pain Inventory Average Pain 8 Pain Right Now 8 My pain is constant, burning and tingling  In the last 24 hours, has pain interfered with the following? General activity 2  Relation with others 0 Enjoyment of life 2  What TIME of day is your pain at its worst? all but more at night Sleep (in general) Good  Pain is worse with: sitting and lying down Pain improves with: sleep Relief from Meds: 8  Mobility walk without assistance how many minutes can you walk? unlimited ability to climb steps?  yes do you drive?  yes  Function retired  Neuro/Psych No problems in this area  Prior Studies Any changes since last visit?  no  Physicians involved in your care Any changes since last visit?  no   Family History  Problem Relation Age of Onset  . Alcohol abuse Father   . Cancer Father        prostate  . CAD Father        possible MI  . Alcohol abuse Paternal Grandfather   . Stroke Mother   . Diabetes Neg Hx    Social History   Socioeconomic History  . Marital status: Married    Spouse name: Not on file  . Number of children: Not on file  . Years of education: Not on file  . Highest education level: Not on file  Occupational History  . Not on file  Social Needs  . Financial resource strain: Not on file  . Food insecurity:    Worry: Not on file    Inability: Not on file  . Transportation needs:    Medical: Not on file    Non-medical: Not on file  Tobacco Use  . Smoking status: Never Smoker  . Smokeless tobacco: Never Used  Substance and Sexual Activity  .  Alcohol use: No  . Drug use: No  . Sexual activity: Not on file  Lifestyle  . Physical activity:    Days per week: Not on file    Minutes per session: Not on file  . Stress: Not on file  Relationships  . Social connections:    Talks on phone: Not on file    Gets together: Not on file    Attends religious service: Not on file    Active member of club or organization: Not on file    Attends meetings of clubs or organizations: Not on file    Relationship status: Not on file  Other Topics Concern  . Not on file  Social History Narrative   "Gene"   Lives with wife    Occupation: retired, was Pharmacist, hospital   Activity: walks daily, some gym   Diet:    Past Surgical History:  Procedure Laterality Date  . CHOLECYSTECTOMY  2006  . COLONOSCOPY WITH PROPOFOL N/A 02/01/2017   Procedure: COLONOSCOPY WITH PROPOFOL;  Surgeon: Toledo, Benay Pike, MD;  Location: [facility] ENDOSCOPY;  Service: Gastroenterology;  Laterality: N/A;  . ESOPHAGOGASTRODUODENOSCOPY (EGD) WITH PROPOFOL N/A 02/01/2017   Procedure: ESOPHAGOGASTRODUODENOSCOPY (EGD) WITH PROPOFOL;  Surgeon: Toledo, Benay Pike, MD;  Location: [facility] ENDOSCOPY;  Service: Gastroenterology;  Laterality: N/A;  . HERNIA REPAIR  08/2010  . PROSTATECTOMY  2003   Past Medical History:  Diagnosis Date  . Dyslipidemia   . H/O prostate cancer 2003   s/p radical prostatectomy (uro Dr. Rush Farmer in [place] yearly)  . Headache 2012   thorough w/u - unrevealing.  has seen 3 neurologists, MRI, LP nonacute.  ESR = 3  . HTN (hypertension)   . Hypogonadism male   . Low testosterone   . Migraines   . OSA on CPAP   . 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 Vitamin D deficiency    There were no vitals taken for this visit.  Opioid Risk Score:   Fall Risk Score:  `1  Depression screen PHQ 2/9  Depression screen [facility] 2/9 08/13/2018 07/13/2018 06/20/2018 05/09/2018 04/05/2018  Decreased Interest 0 0 0 0 0  Down, Depressed, Hopeless 0 0 0 0 0  PHQ - 2 Score 0 0 0 0 0     Review of Systems  Constitutional: Positive for appetite change and unexpected weight change.  HENT: Negative.   Eyes: Negative.   Respiratory: Negative.  Negative for apnea.   Cardiovascular: Negative.   Gastrointestinal: Positive for constipation.  Endocrine: Negative.   Genitourinary: Negative.   Musculoskeletal: Negative.   Skin: Positive for color change.  Allergic/Immunologic: Negative.   Neurological: Negative.        Burning  Hematological: Negative.   Psychiatric/Behavioral: Negative.   All other systems  reviewed and are negative.     Objective:   Physical Exam Gen: NAD. HENT: Normocephalic, Atraumatic Eyes: EOMI. No discharge.  Cardio: No JVD. Pulm: Effort normal Neuro: Alert and oriented    Assessment & Plan:  73 y/o year young male with pmh of Vit D deficiency, OSA, migraines, low testosterone, HTN, presents with burning in face and shoulders.    1. 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   Lidoderm patch,  Zyrtec, Tomapax without benefit  Steroids made symptoms more severe  Side effects with Cymbalta  Will consider PT for neck ROM and cervical traction and TENS  Will consider referral to Psychology - CBT as rest and no activity seem to exacerbate the pain after CoVid.    Will consider Accupuncture  Will consider Vascular/Rheum referral for Raynaud like symptoms  ?Horner's like syndrome (delayed pupil relfex, history of ptosis s/p surgery)  Will consider Sympathetic ganglion block/ESIs  Patient states main 2 goals are travel and play with grandchildren  Cont 200 mg Lyrica TID  Trial Capsacin cream again, reminded again  Trial Lidocaine ointment  Encouraged Mediterranean diet  Encouraged Mag 265m daily  Will consider trigger point injections  Will consider Hypnosis  Will consider Accupunture  Will consider Lidocaine infusion  Will consider Gabapentin at higher doses  He is playing with his grandchildren more  2. Sleep disturbance  See #1  3. Vitamin B12 deficiency  Level 278 on 06/2018, Cont supplement   4. Vitamin D  Level 43 06/2018, cont supplement ordered

## 2018-09-19 ENCOUNTER — Encounter: Payer: Self-pay | Admitting: Physical Medicine & Rehabilitation

## 2018-09-19 ENCOUNTER — Other Ambulatory Visit: Payer: Self-pay

## 2018-09-19 ENCOUNTER — Encounter: Payer: Medicare Other | Attending: Physical Medicine & Rehabilitation | Admitting: Physical Medicine & Rehabilitation

## 2018-09-19 VITALS — BP 125/78 | Ht 72.0 in | Wt 200.0 lb

## 2018-09-19 DIAGNOSIS — E538 Deficiency of other specified B group vitamins: Secondary | ICD-10-CM

## 2018-09-19 DIAGNOSIS — E559 Vitamin D deficiency, unspecified: Secondary | ICD-10-CM

## 2018-09-19 DIAGNOSIS — M4802 Spinal stenosis, cervical region: Secondary | ICD-10-CM | POA: Insufficient documentation

## 2018-09-19 DIAGNOSIS — G4733 Obstructive sleep apnea (adult) (pediatric): Secondary | ICD-10-CM | POA: Insufficient documentation

## 2018-09-19 DIAGNOSIS — G43909 Migraine, unspecified, not intractable, without status migrainosus: Secondary | ICD-10-CM | POA: Insufficient documentation

## 2018-09-19 DIAGNOSIS — Z8546 Personal history of malignant neoplasm of prostate: Secondary | ICD-10-CM | POA: Insufficient documentation

## 2018-09-19 DIAGNOSIS — G479 Sleep disorder, unspecified: Secondary | ICD-10-CM

## 2018-09-19 DIAGNOSIS — R208 Other disturbances of skin sensation: Secondary | ICD-10-CM | POA: Insufficient documentation

## 2018-09-19 DIAGNOSIS — G501 Atypical facial pain: Secondary | ICD-10-CM

## 2018-09-19 DIAGNOSIS — M792 Neuralgia and neuritis, unspecified: Secondary | ICD-10-CM | POA: Diagnosis not present

## 2018-09-19 DIAGNOSIS — E785 Hyperlipidemia, unspecified: Secondary | ICD-10-CM | POA: Insufficient documentation

## 2018-09-19 DIAGNOSIS — I10 Essential (primary) hypertension: Secondary | ICD-10-CM | POA: Insufficient documentation

## 2018-09-19 DIAGNOSIS — G8929 Other chronic pain: Secondary | ICD-10-CM | POA: Insufficient documentation

## 2018-09-19 NOTE — Progress Notes (Signed)
Subjective:    Patient ID: Evan Robinson, male    DOB: [birth year], 73 y.o.   MRN: [id number]  TELEHEALTH NOTE  Due to national recommendations of social distancing due to COVID 19, an audio/video telehealth visit is felt to be most appropriate for this patient at this time.  See Chart message from today for the patient's consent to telehealth from [facility].     I verified that I am speaking with the correct person using two identifiers.  Location of patient: Home Location of provider: Office Method of communication: Webex Names of participants : Zorita Pang scheduling, Marland Mcalpine obtaining consent and vitals if available Established patient Time spent on call: 76 minutes  HPI  73 y/o year young male with pmh of Vit D deficiency, OSA, migraines, low testosterone, HTN, presents with burning in face and shoulders.   Initially stated: Started ~12/2016. He went to IM, Endo, Derm with negative workup. He had lab work for flushing and workup was negative. He saw Neurology, started on Cymbalta and Topomax without benefit. He had brain/C-spine MRI, which showed cervical stenosis. He saw Neurology and was told not radiculopathy related. Progressively getting worse.  Denies alleviating factors. Associated cold hands. Stationary positions exacerbate the pain.  Burning type pain. Radiates to all extremities at times. Denies associated weakness. Denies falls. Wants to travel and grandchildren. Pheochromocytoma workup negative. States he has tried everything. Carotid ultrasound negative per patient.   Last clinic visit 08/13/2018.  Since that time, he tried Capsicin cream with benefit to his shoulder, but it travelled to his face.  Lidocaine ointment with improvement.   Pain Inventory Average Pain 8 Pain Right Now 7 My pain is constant, burning and tingling  In the last 24 hours, has pain interfered with the following? General activity 2 Relation with others 0  Enjoyment of life 2  What TIME of day is your pain at its worst? all but more at night Sleep (in general) Good  Pain is worse with: sitting and lying down Pain improves with: sleep Relief from Meds: 8  Mobility walk without assistance how many minutes can you walk? unlimited ability to climb steps?  yes do you drive?  yes  Function retired  Neuro/Psych No problems in this area  Prior Studies Any changes since last visit?  no  Physicians involved in your care Any changes since last visit?  no   Family History  Problem Relation Age of Onset  . Alcohol abuse Father   . Cancer Father        prostate  . CAD Father        possible MI  . Alcohol abuse Paternal Grandfather   . Stroke Mother   . Diabetes Neg Hx    Social History   Socioeconomic History  . Marital status: Married    Spouse name: Not on file  . Number of children: Not on file  . Years of education: Not on file  . Highest education level: Not on file  Occupational History  . Not on file  Social Needs  . Financial resource strain: Not on file  . Food insecurity:    Worry: Not on file    Inability: Not on file  . Transportation needs:    Medical: Not on file    Non-medical: Not on file  Tobacco Use  . Smoking status: Never Smoker  . Smokeless tobacco: Never Used  Substance and Sexual Activity  . Alcohol use: No  .  Drug use: No  . Sexual activity: Not on file  Lifestyle  . Physical activity:    Days per week: Not on file    Minutes per session: Not on file  . Stress: Not on file  Relationships  . Social connections:    Talks on phone: Not on file    Gets together: Not on file    Attends religious service: Not on file    Active member of club or organization: Not on file    Attends meetings of clubs or organizations: Not on file    Relationship status: Not on file  Other Topics Concern  . 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 CHOLECYSTECTOMY  2006  . COLONOSCOPY WITH PROPOFOL N/A 02/01/2017   Procedure: COLONOSCOPY WITH PROPOFOL;  Surgeon: Toledo, Benay Pike, MD;  Location: [facility] END[facility]COPY;  Service: Gastroenterology;  Laterality: N/A;  . ESOPHAGOGASTRODUODENOSCOPY (EGD) WITH PROPOFOL N/A 02/01/2017   Procedure: ESOPHAGOGASTRODUODENOSCOPY (EGD) WITH PROPOFOL;  Surgeon: Toledo, Benay Pike, MD;  Location: [facility] ENDOSCOPY;  Service: Gastroenterology;  Laterality: N/A;  . HERNIA REPAIR  08/2010  . PROSTATECTOMY  2003   Past Medical History:  Diagnosis Date  . Dyslipidemia   . H/O prostate cancer 2003   s/p radical prostatectomy (uro Dr. Rush Farmer in [place] yearly)  . Headache 2012   thorough w/u - unrevealing.  has seen 3 neurologists, MRI, LP nonacute.  ESR = 3  . HTN (hypertension)   . Hypogonadism male   . Low testosterone   . Migraines   . OSA on CPAP   . 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 Vitamin D deficiency    BP 125/78   Ht 6' (1.829 m)   Wt 200 lb (90.7 kg)   BMI 27.12 kg/m   Opioid Risk Score:   Fall Risk Score:  `1  Depression screen PHQ 2/9  Depression screen [facility] 2/9 08/13/2018 07/13/2018 06/20/2018 05/09/2018 04/05/2018  Decreased Interest 0 0 0 0 0  Down, Depressed, Hopeless 0 0 0 0 0  PHQ - 2 Score 0 0 0 0 0     Review of Systems  Constitutional: Positive for unexpected weight change.  HENT: Negative.   Eyes: Negative.   Respiratory: Negative.   Cardiovascular: Negative.   Gastrointestinal: Negative.   Endocrine: Negative.   Genitourinary: Negative.   Musculoskeletal: Negative.   Skin: Positive for color change.  Allergic/Immunologic: Negative.   Neurological: Negative.        Burning  Hematological: Negative.   Psychiatric/Behavioral: Negative.   All other systems reviewed and are negative.     Objective:   Physical Exam  Gen: NAD. HENT: Normocephalic, Atraumatic Eyes: EOMI. No discharge.  Cardio: No JVD. Pulm: Effort normal Neuro: Alert and oriented    Assessment & Plan:  72 y/o year young male with pmh of Vit D deficiency, OSA, migraines, low testosterone, HTN, presents with burning in face and shoulders.    1. 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   Lidoderm patch,  Zyrtec, Tomapax without benefit  Steroids made symptoms more severe  Side effects with Cymbalta  Will consider PT for neck ROM and cervical traction and TENS  Will consider referral to Psychology - CBT as rest and no activity seem to exacerbate the pain after CoVid.    Will consider Accupuncture  Will consider Vascular/Rheum referral for Raynaud like symptoms  ?Horner's like syndrome (delayed pupil relfex, history of ptosis s/p surgery)  Will consider Sympathetic ganglion block/ESIs  Patient states main 2 goals are travel and play with grandchildren  Cont 200 mg Lyrica TID  Good benefit with capsaicin cream normocephalic her shoulders, however pain traveled to head afterward   Continue lidocaine ointment  Encouraged Mediterranean diet  Encouraged Mag 217m daily  Will consider trigger point injections  Will consider Hypnosis  Will consider Accupunture  Will consider Lidocaine infusion  Will consider Gabapentin at higher doses  Will consider Botox injection  Will discuss with pharmacy and research any potential systemic substance P agonist vsantagonist  He is playing with his grandchildren more  2. Sleep disturbance  See #1  3. Vitamin B12 deficiency  Level 278 on 06/2018, Cont supplement   4. Vitamin D  Level 43 06/2018, cont supplement

## 2018-10-05 ENCOUNTER — Other Ambulatory Visit: Payer: Self-pay | Admitting: *Deleted

## 2018-10-05 ENCOUNTER — Telehealth: Payer: Self-pay | Admitting: *Deleted

## 2018-10-05 MED ORDER — PREGABALIN 200 MG PO CAPS: 200.0000 mg | ORAL_CAPSULE | Freq: Two times a day (BID) | ORAL | 2 refills | Status: DC

## 2018-10-05 MED ORDER — PREGABALIN 100 MG PO CAPS: 100.0000 mg | ORAL_CAPSULE | Freq: Every day | ORAL | 0 refills | Status: DC

## 2018-10-05 NOTE — Telephone Encounter (Signed)
We can order Lyrica 200 mg twice a day with 100 mg at night.  There are 2 prescription because it is a capsule and cannot be broken.  Thanks.

## 2018-10-05 NOTE — Telephone Encounter (Signed)
Pharmacy contacted our office conflicted with to different Rx's for lyrica with 2 different strengths, 200 mg and 100 mg.  I looked at last note and it stated 200 mg TID.  I relayed this information to the pharmacy.  Patient called [place] and he states that he had conversation with Dr. Allena Katz that 200 mg at night was too much.  He says he told Dr. Allena Katz that he wants 200 mg BID during the day and 100 mg at night.  He would like it fixed ASAP

## 2018-10-18 ENCOUNTER — Encounter: Payer: Self-pay | Admitting: Physical Medicine & Rehabilitation

## 2018-10-18 ENCOUNTER — Other Ambulatory Visit: Payer: Self-pay

## 2018-10-18 ENCOUNTER — Encounter: Payer: Medicare Other | Attending: Physical Medicine & Rehabilitation | Admitting: Physical Medicine & Rehabilitation

## 2018-10-18 VITALS — BP 126/85 | HR 81 | Temp 97.9°F | Ht 72.0 in | Wt 202.2 lb

## 2018-10-18 DIAGNOSIS — G4733 Obstructive sleep apnea (adult) (pediatric): Secondary | ICD-10-CM | POA: Insufficient documentation

## 2018-10-18 DIAGNOSIS — M792 Neuralgia and neuritis, unspecified: Secondary | ICD-10-CM

## 2018-10-18 DIAGNOSIS — R208 Other disturbances of skin sensation: Secondary | ICD-10-CM | POA: Diagnosis not present

## 2018-10-18 DIAGNOSIS — M4802 Spinal stenosis, cervical region: Secondary | ICD-10-CM | POA: Diagnosis not present

## 2018-10-18 DIAGNOSIS — G501 Atypical facial pain: Secondary | ICD-10-CM

## 2018-10-18 DIAGNOSIS — E538 Deficiency of other specified B group vitamins: Secondary | ICD-10-CM | POA: Diagnosis not present

## 2018-10-18 DIAGNOSIS — G8929 Other chronic pain: Secondary | ICD-10-CM | POA: Insufficient documentation

## 2018-10-18 DIAGNOSIS — E559 Vitamin D deficiency, unspecified: Secondary | ICD-10-CM | POA: Insufficient documentation

## 2018-10-18 DIAGNOSIS — G479 Sleep disorder, unspecified: Secondary | ICD-10-CM

## 2018-10-18 DIAGNOSIS — E785 Hyperlipidemia, unspecified: Secondary | ICD-10-CM | POA: Insufficient documentation

## 2018-10-18 DIAGNOSIS — Z8546 Personal history of malignant neoplasm of prostate: Secondary | ICD-10-CM | POA: Diagnosis not present

## 2018-10-18 DIAGNOSIS — I10 Essential (primary) hypertension: Secondary | ICD-10-CM | POA: Diagnosis not present

## 2018-10-18 DIAGNOSIS — G43909 Migraine, unspecified, not intractable, without status migrainosus: Secondary | ICD-10-CM | POA: Diagnosis not present

## 2018-10-18 MED ORDER — AMITRIPTYLINE HCL 10 MG PO TABS: 10.0000 mg | ORAL_TABLET | Freq: Every day | ORAL | 1 refills | Status: DC

## 2018-10-18 NOTE — Progress Notes (Signed)
Subjective:    Patient ID: Evan Robinson, male    DOB: October 24, 1945, 73 y.o.   MRN: [id number]   HPI  Male with pmh of Vit D deficiency, OSA, migraines, low testosterone, HTN, presents with burning in face and shoulders.   Initially stated: Started ~12/2016. He went to IM, Endo, Derm with negative workup. He had lab work for flushing and workup was negative. He saw Neurology, started on Cymbalta and Topomax without benefit. He had brain/C-spine MRI, which showed cervical stenosis. He saw Neurology and was told not radiculopathy related. Progressively getting worse.  Denies alleviating factors. Associated cold hands. Stationary positions exacerbate the pain.  Burning type pain. Radiates to all extremities at times. Denies associated weakness. Denies falls. Wants to travel and grandchildren. Pheochromocytoma workup negative. States he has tried everything. Carotid ultrasound negative per patient.   Last clinic visit  10/05/2018.  Since that time, he is taking Lyrica 200, 200, 100.  He is having intermittent balance issues with it.  He has eating salads and yogurts, but no difference in symptoms.  He is not gaining weight. He started taking Mag. Discussed with pharmacy oral substance P agonist, but not available. He tried Capsacin on his face, but had did not improve his symptoms. He is using aloe vera.    Pain Inventory Average Pain 8 Pain Right Now 8 My pain is constant, burning and tingling  In the last 24 hours, has pain interfered with the following? General activity 5 Relation with others 3 Enjoyment of life 5  What TIME of day is your pain at its worst? all but more at night Sleep (in general) Good  Pain is worse with: sitting and lying down Pain improves with: sleep Relief from Meds: 8  Mobility walk without assistance how many minutes can you walk? unlimited ability to climb steps?  yes do you drive?  yes  Function retired  Neuro/Psych No problems in this area  Prior Studies  Any changes since last visit?  no  Physicians involved in your care Any changes since last visit?  no   Family History  Problem Relation Age of Onset  . Alcohol abuse Father   . Cancer Father        prostate  . CAD Father        possible MI  . Alcohol abuse Paternal Grandfather   . Stroke Mother   . Diabetes Neg Hx    Social History   Socioeconomic History  . Marital status: Married    Spouse name: Not on file  . Number of children: Not on file  . Years of education: Not on file  . Highest education level: Not on file  Occupational History  . Not on file  Social Needs  . Financial resource strain: Not on file  . Food insecurity    Worry: Not on file    Inability: Not on file  . Transportation needs    Medical: Not on file    Non-medical: Not on file  Tobacco Use  . Smoking status: Never Smoker  . Smokeless tobacco: Never Used  Substance and Sexual Activity  . Alcohol use: No  . Drug use: No  . Sexual activity: Not on file  Lifestyle  . Physical activity    Days per week: Not on file    Minutes per session: Not on file  . Stress: Not on file  Relationships  . Social connections    Talks on phone: Not on file  Gets together: Not on file    Attends religious service: Not on file    Active member of club or organization: Not on file    Attends meetings of clubs or organizations: Not on file    Relationship status: Not on file  Other Topics Concern  . Not on file  Social History Narrative   "Gene"   Lives with wife   Occupation: retired, was Pharmacist, hospital   Activity: walks daily, some gym   Diet:    Past Surgical History:  Procedure Laterality Date  . CHOLECYSTECTOMY  2006  . COLONOSCOPY WITH PROPOFOL N/A 02/01/2017   Procedure: COLONOSCOPY WITH PROPOFOL;  Surgeon: Toledo, Benay Pike, MD;  Location: [facility] END[facility]COPY;  Service: Gastroenterology;  Laterality: N/A;  . ESOPHAGOGASTRODUODENOSCOPY (EGD) WITH PROPOFOL N/A 02/01/2017   Procedure:  ESOPHAGOGASTRODUODENOSCOPY (EGD) WITH PROPOFOL;  Surgeon: Toledo, Benay Pike, MD;  Location: [facility] END[facility]COPY;  Service: Gastroenterology;  Laterality: N/A;  . HERNIA REPAIR  08/2010  . PROSTATECTOMY  2003   Past Medical History:  Diagnosis Date  . Dyslipidemia   . H/O prostate cancer 2003   s/p radical prostatectomy (uro Dr. Rush Farmer in [place] yearly)  . Headache 2012   thorough w/u - unrevealing.  has seen 3 neurologists, MRI, LP nonacute.  ESR = 3  . HTN (hypertension)   . Hypogonadism male   . Low testosterone   . Migraines   . OSA on CPAP   . 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 Vitamin D deficiency    BP 126/85   Pulse 81   Temp 97.9 F (36.6 C)   Ht 6' (1.829 m)   Wt 202 lb 3.2 oz (91.7 kg)   SpO2 98%   BMI 27.42 kg/m   Opioid Risk Score:   Fall Risk Score:  `1  Depression screen PHQ 2/9  Depression screen [facility] 2/9 08/13/2018 07/13/2018 06/20/2018 05/09/2018 04/05/2018  Decreased Interest 0 0 0 0 0  Down, Depressed, Hopeless 0 0 0 0 0  PHQ - 2 Score 0 0 0 0 0     Review of Systems  Constitutional: Positive for unexpected weight change.  HENT: Negative.   Eyes: Negative.   Respiratory: Negative.   Cardiovascular: Negative.   Gastrointestinal: Negative.   Endocrine: Negative.   Genitourinary: Negative.   Musculoskeletal: Negative.   Skin: Positive for color change.  Allergic/Immunologic: Negative.   Neurological: Negative.        Burning  Hematological: Negative.   Psychiatric/Behavioral: Negative.   All other systems reviewed and are negative.     Objective:   Physical Exam Gen: NAD. Vital signs reviewed HENT: Normocephalic, Atraumatic Eyes: EOMI. No discharge.  Cardio: No JVD. Pulm: Effort normal. Abd: Nondistended.  MSK:   Gait WNL.             No TTP diffusely.               No edema.  Neuro:             Strength          5/5 in all UE myotomes Skin: Warm and Dry. Intact    Assessment & Plan:  Male  with pmh of Vit D deficiency, OSA, migraines, low testosterone, HTN, presents with burning in face and shoulders.    1. 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   Lidoderm patch, Zyrtec, Tomapax without benefit  Steroids made symptoms more severe  Side effects with Cymbalta  Will consider PT for neck ROM and cervical traction and TENS  Will consider referral to Psychology - CBT as rest and no activity seem to exacerbate the pain after CoVid.    Will consider Accupuncture  Will consider Vascular/Rheum referral for Raynaud like symptoms  ?Horner's like syndrome (delayed pupil relfex, history of ptosis s/p surgery)  Will consider Sympathetic ganglion block/ESIs  Patient states main 2 goals are travel and play with grandchildren  Cont 200, 200, 100 mg Lyrica   Good benefit with capsaicin cream on shoulders  Continue lidocaine ointment  Encouraged Mediterranean diet  Cont Mag 218m daily  Will consider trigger point injections  Will consider Hypnosis  Will consider Accupunture  Will consider Lidocaine infusion  Will consider Gabapentin at higher doses  Will consider Botox injection  He is playing with his grandchildren more  Will consider nitropaste  Will order Elavil  2. Sleep disturbance  See #1  3. Vitamin B12 deficiency  Level 278 on 06/2018, Cont supplement   4. Vitamin D  Level 43 06/2018, Cont supplement

## 2018-11-15 ENCOUNTER — Encounter: Payer: Self-pay | Admitting: Physical Medicine & Rehabilitation

## 2018-11-15 ENCOUNTER — Encounter: Payer: Medicare Other | Attending: Physical Medicine & Rehabilitation | Admitting: Physical Medicine & Rehabilitation

## 2018-11-15 ENCOUNTER — Other Ambulatory Visit: Payer: Self-pay

## 2018-11-15 VITALS — BP 120/78 | HR 67 | Temp 97.7°F | Ht 72.0 in | Wt 200.0 lb

## 2018-11-15 DIAGNOSIS — E785 Hyperlipidemia, unspecified: Secondary | ICD-10-CM | POA: Diagnosis not present

## 2018-11-15 DIAGNOSIS — R208 Other disturbances of skin sensation: Secondary | ICD-10-CM | POA: Insufficient documentation

## 2018-11-15 DIAGNOSIS — M792 Neuralgia and neuritis, unspecified: Secondary | ICD-10-CM

## 2018-11-15 DIAGNOSIS — G8929 Other chronic pain: Secondary | ICD-10-CM | POA: Insufficient documentation

## 2018-11-15 DIAGNOSIS — E559 Vitamin D deficiency, unspecified: Secondary | ICD-10-CM

## 2018-11-15 DIAGNOSIS — E538 Deficiency of other specified B group vitamins: Secondary | ICD-10-CM

## 2018-11-15 DIAGNOSIS — Z8546 Personal history of malignant neoplasm of prostate: Secondary | ICD-10-CM | POA: Diagnosis not present

## 2018-11-15 DIAGNOSIS — G479 Sleep disorder, unspecified: Secondary | ICD-10-CM

## 2018-11-15 DIAGNOSIS — G501 Atypical facial pain: Secondary | ICD-10-CM

## 2018-11-15 DIAGNOSIS — M4802 Spinal stenosis, cervical region: Secondary | ICD-10-CM | POA: Diagnosis not present

## 2018-11-15 DIAGNOSIS — G43909 Migraine, unspecified, not intractable, without status migrainosus: Secondary | ICD-10-CM | POA: Insufficient documentation

## 2018-11-15 DIAGNOSIS — I10 Essential (primary) hypertension: Secondary | ICD-10-CM | POA: Insufficient documentation

## 2018-11-15 DIAGNOSIS — G4733 Obstructive sleep apnea (adult) (pediatric): Secondary | ICD-10-CM | POA: Insufficient documentation

## 2018-11-15 MED ORDER — NORTRIPTYLINE HCL 10 MG/5ML PO SOLN: 2.5000 mg | Freq: Every day | ORAL | 1 refills | Status: DC

## 2018-11-15 NOTE — Addendum Note (Signed)
Addended by: Delice Lesch A on: 11/15/2018 10:24 AM   Modules accepted: Orders

## 2018-11-15 NOTE — Progress Notes (Addendum)
Subjective:    Patient ID: Evan Robinson, male    DOB: 05/18/1945, 73 y.o.   MRN: [id number]   HPI  Male with pmh of Vit D deficiency, OSA, migraines, low testosterone, HTN, presents with burning in face and shoulders.   Initially stated: Started ~12/2016. He went to IM, Endo, Derm with negative workup. He had lab work for flushing and workup was negative. He saw Neurology, started on Cymbalta and Topomax without benefit. He had brain/C-spine MRI, which showed cervical stenosis. He saw Neurology and was told not radiculopathy related. Progressively getting worse.  Denies alleviating factors. Associated cold hands. Stationary positions exacerbate the pain.  Burning type pain. Radiates to all extremities at times. Denies associated weakness. Denies falls. Wants to travel and grandchildren. Pheochromocytoma workup negative. States he has tried everything. Carotid ultrasound negative per patient.   Last clinic visit  10/18/2018.  Since that time, he continues to use Lyrica and capsaicin cream. Could not tolerate Elavil, but helped him sleep. Likes to drink milk.  Pain Inventory Average Pain 9 Pain Right Now 8 My pain is constant, burning and tingling  In the last 24 hours, has pain interfered with the following? General activity 6 Relation with others 4 Enjoyment of life 6  What TIME of day is your pain at its worst? all but more at night Sleep (in general) Good  Pain is worse with: sitting and lying down Pain improves with: sleep Relief from Meds: 8  Mobility walk without assistance how many minutes can you walk? unlimited ability to climb steps?  yes do you drive?  yes  Function retired  Neuro/Psych No problems in this area  Prior Studies Any changes since last visit?  no  Physicians involved in your care Any changes since last visit?  no   Family History  Problem Relation Age of Onset  . Alcohol abuse Father   . Cancer Father        prostate  . CAD Father    possible MI  . Alcohol abuse Paternal Grandfather   . Stroke Mother   . Diabetes Neg Hx    Social History   Socioeconomic History  . Marital status: Married    Spouse name: Not on file  . Number of children: Not on file  . Years of education: Not on file  . Highest education level: Not on file  Occupational History  . Not on file  Social Needs  . Financial resource strain: Not on file  . Food insecurity    Worry: Not on file    Inability: Not on file  . Transportation needs    Medical: Not on file    Non-medical: Not on file  Tobacco Use  . Smoking status: Never Smoker  . Smokeless tobacco: Never Used  Substance and Sexual Activity  . Alcohol use: No  . Drug use: No  . Sexual activity: Not on file  Lifestyle  . Physical activity    Days per week: Not on file    Minutes per session: Not on file  . Stress: Not on file  Relationships  . Social Herbalist on phone: Not on file    Gets together: Not on file    Attends religious service: Not on file    Active member of club or organization: Not on file    Attends meetings of clubs or organizations: Not on file    Relationship status: Not on file  Other Topics Concern  .  Not on file  Social History Narrative   "Gene"   Lives with wife   Occupation: retired, was Pharmacist, hospital   Activity: walks daily, some gym   Diet:    Past Surgical History:  Procedure Laterality Date  . CHOLECYSTECTOMY  2006  . COLONOSCOPY WITH PROPOFOL N/A 02/01/2017   Procedure: COLONOSCOPY WITH PROPOFOL;  Surgeon: Toledo, Benay Pike, MD;  Location: [facility] END[facility];  Service: Gastroenterology;  Laterality: N/A;  . ESOPHAGOGASTRODUODENOSCOPY (EGD) WITH PROPOFOL N/A 02/01/2017   Procedure: ESOPHAGOGASTRODUODENOSCOPY (EGD) WITH PROPOFOL;  Surgeon: Toledo, Benay Pike, MD;  Location: [facility] END[facility];  Service: Gastroenterology;  Laterality: N/A;  . HERNIA REPAIR  08/2010  . PROSTATECTOMY  2003   Past Medical History:  Diagnosis Date  .  Dyslipidemia   . H/O prostate cancer 2003   s/p radical prostatectomy (uro Dr. Rush Farmer in [place] yearly)  . Headache 2012   thorough w/u - unrevealing.  has seen 3 neurologists, MRI, LP nonacute.  ESR = 3  . HTN (hypertension)   . Hypogonadism male   . Low testosterone   . Migraines   . OSA on CPAP   . 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 Vitamin D deficiency    BP 120/78   Pulse 67   Temp 97.7 F (36.5 C)   Ht 6' (1.829 m)   Wt 200 lb (90.7 kg)   SpO2 98%   BMI 27.12 kg/m   Opioid Risk Score:   Fall Risk Score:  `1  Depression screen PHQ 2/9  Depression screen [facility] 2/9 10/18/2018 08/13/2018 07/13/2018 06/20/2018 05/09/2018 04/05/2018  Decreased Interest 0 0 0 0 0 0  Down, Depressed, Hopeless 0 0 0 0 0 0  PHQ - 2 Score 0 0 0 0 0 0     Review of Systems  Constitutional: Positive for unexpected weight change.  HENT: Negative.   Eyes: Negative.   Respiratory: Negative.   Cardiovascular: Negative.   Gastrointestinal: Negative.   Endocrine: Negative.   Genitourinary: Negative.   Musculoskeletal: Negative.   Skin: Negative.   Allergic/Immunologic: Negative.   Neurological: Positive for dizziness.       Burning  Hematological: Negative.   Psychiatric/Behavioral: Negative.   All other systems reviewed and are negative.     Objective:   Physical Exam Gen: NAD. Vital signs reviewed HENT: Normocephalic, Atraumatic Eyes: EOMI. No discharge.  Cardio: No JVD. Pulm: Effort normal. Abd: Non-distended.  MSK:   Gait WNL.             No TTP diffusely.               No edema.  Neuro:             Strength          5/5 in all UE myotomes Skin: Warm and Dry. Intact    Assessment & Plan:  Male with pmh of Vit D deficiency, OSA, migraines, low testosterone, HTN, presents with burning in face and shoulders.    1. 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   Lidoderm patch, Zyrtec, Tomapax without benefit  Steroids made symptoms more severe  Side effects with Cymbalta  Will consider PT for neck ROM and cervical  traction and TENS  Will consider referral to Psychology - CBT as rest and no activity seem to exacerbate the pain after CoVid.    Will consider Accupuncture  Will consider Vascular/Rheum referral for Raynaud like symptoms  ?Horner's like syndrome (delayed pupil relfex, history of ptosis s/p surgery)  Will consider Sympathetic ganglion block/ESIs  Patient states main 2 goals are travel and play with grandchildren  Cont 200, 200, 100 mg Lyrica, encouraged with food  Good benefit with capsaicin cream on shoulders, cont  Continue lidocaine ointment  Encouraged Mediterranean diet  Cont Mag 270m daily  Will consider trigger point injections  Will consider Accupunture  Will consider Lidocaine infusion  Will consider Gabapentin at higher doses  Will consider Botox injection  He is playing with his grandchildren more  Will consider nitropaste  Will change to Elavil to Pamelor 2.54m Trial Almond milk x1 month  Trial hypnosis  2. Sleep disturbance  See #1  3. Vitamin B12 deficiency  Level 278 on 06/2018, Cont supplement   4. Vitamin D  Level 43 06/2018, Cont supplement

## 2018-11-17 ENCOUNTER — Other Ambulatory Visit: Payer: Self-pay | Admitting: Physical Medicine & Rehabilitation

## 2018-11-21 ENCOUNTER — Telehealth: Payer: Self-pay

## 2018-11-21 NOTE — Telephone Encounter (Signed)
error 

## 2018-11-29 ENCOUNTER — Telehealth: Payer: Self-pay | Admitting: *Deleted

## 2018-11-29 NOTE — Telephone Encounter (Signed)
Prior auth submitted to [employer] [facility] via CoverMyMeds for nortriptyline 10 mg/5 ml   #39 ml/30 days.

## 2018-11-29 NOTE — Telephone Encounter (Signed)
Prior auth for nortriptyline was denied due to Non-FDA approved diagnosis.

## 2018-12-13 ENCOUNTER — Ambulatory Visit: Payer: Medicare Other | Admitting: Physical Medicine & Rehabilitation

## 2018-12-17 ENCOUNTER — Other Ambulatory Visit: Payer: Self-pay | Admitting: Physical Medicine & Rehabilitation

## 2019-01-03 ENCOUNTER — Other Ambulatory Visit: Payer: Self-pay

## 2019-01-03 ENCOUNTER — Encounter: Payer: Medicare Other | Attending: Physical Medicine & Rehabilitation | Admitting: Physical Medicine & Rehabilitation

## 2019-01-03 ENCOUNTER — Encounter: Payer: Self-pay | Admitting: Physical Medicine & Rehabilitation

## 2019-01-03 VITALS — BP 131/81 | HR 94 | Temp 97.8°F | Ht 72.0 in | Wt 191.0 lb

## 2019-01-03 DIAGNOSIS — M4802 Spinal stenosis, cervical region: Secondary | ICD-10-CM | POA: Diagnosis not present

## 2019-01-03 DIAGNOSIS — G8929 Other chronic pain: Secondary | ICD-10-CM | POA: Insufficient documentation

## 2019-01-03 DIAGNOSIS — G43909 Migraine, unspecified, not intractable, without status migrainosus: Secondary | ICD-10-CM | POA: Insufficient documentation

## 2019-01-03 DIAGNOSIS — G479 Sleep disorder, unspecified: Secondary | ICD-10-CM | POA: Diagnosis not present

## 2019-01-03 DIAGNOSIS — G4733 Obstructive sleep apnea (adult) (pediatric): Secondary | ICD-10-CM | POA: Diagnosis not present

## 2019-01-03 DIAGNOSIS — E559 Vitamin D deficiency, unspecified: Secondary | ICD-10-CM | POA: Diagnosis not present

## 2019-01-03 DIAGNOSIS — I10 Essential (primary) hypertension: Secondary | ICD-10-CM | POA: Diagnosis not present

## 2019-01-03 DIAGNOSIS — E785 Hyperlipidemia, unspecified: Secondary | ICD-10-CM | POA: Insufficient documentation

## 2019-01-03 DIAGNOSIS — Z8546 Personal history of malignant neoplasm of prostate: Secondary | ICD-10-CM | POA: Diagnosis not present

## 2019-01-03 DIAGNOSIS — G501 Atypical facial pain: Secondary | ICD-10-CM

## 2019-01-03 DIAGNOSIS — R208 Other disturbances of skin sensation: Secondary | ICD-10-CM | POA: Diagnosis not present

## 2019-01-03 DIAGNOSIS — M792 Neuralgia and neuritis, unspecified: Secondary | ICD-10-CM | POA: Diagnosis not present

## 2019-01-03 DIAGNOSIS — E538 Deficiency of other specified B group vitamins: Secondary | ICD-10-CM

## 2019-01-03 NOTE — Progress Notes (Signed)
Subjective:    Patient ID: Evan Robinson, male    DOB: 12-30-1945, 73 y.o.   MRN: [id number]   HPI  Male with pmh of Vit D deficiency, OSA, migraines, low testosterone, HTN, presents with burning in face and shoulders.   Initially stated: Started ~12/2016. He went to IM, Endo, Derm with negative workup. He had lab work for flushing and workup was negative. He saw Neurology, started on Cymbalta and Topomax without benefit. He had brain/C-spine MRI, which showed cervical stenosis. He saw Neurology and was told not radiculopathy related. Progressively getting worse.  Denies alleviating factors. Associated cold hands. Stationary positions exacerbate the pain.  Burning type pain. Radiates to all extremities at times. Denies associated weakness. Denies falls. Wants to travel and grandchildren. Pheochromocytoma workup negative. States he has tried everything. Carotid ultrasound negative per patient.   Last clinic visit  11/15/2018.  Since that time, she has not felt well in the last 6 months.  He went to his PCP and is being worked up for irregular heart beat.  He is tolerating Lyrica with food.  When things start to get worse, he uses the capsaicin, which give relief for few days. He did not try Pamelor due to insurance not covering.  He did not notice difference with Almond milk. He has not followed up with hypnosis due to HR issues. Sleep has improved.  Pain Inventory Average Pain 8 Pain Right Now 7 My pain is burning  In the last 24 hours, has pain interfered with the following? General activity 7 Relation with others 7 Enjoyment of life 7  What TIME of day is your pain at its worst? night Sleep (in general) Fair  Pain is worse with: sitting Pain improves with: rest and medication Relief from Meds: 8  Mobility walk without assistance how many minutes can you walk? unlimited ability to climb steps?  yes do you drive?  yes  Function retired  Neuro/Psych No problems in this area   Prior Studies Any changes since last visit?  no  Physicians involved in your care Any changes since last visit?  no   Family History  Problem Relation Age of Onset  . Alcohol abuse Father   . Cancer Father        prostate  . CAD Father        possible MI  . Alcohol abuse Paternal Grandfather   . Stroke Mother   . Diabetes Neg Hx    Social History   Socioeconomic History  . Marital status: Married    Spouse name: Not on file  . Number of children: Not on file  . Years of education: Not on file  . Highest education level: Not on file  Occupational History  . Not on file  Social Needs  . Financial resource strain: Not on file  . Food insecurity    Worry: Not on file    Inability: Not on file  . Transportation needs    Medical: Not on file    Non-medical: Not on file  Tobacco Use  . Smoking status: Never Smoker  . Smokeless tobacco: Never Used  Substance and Sexual Activity  . Alcohol use: No  . Drug use: No  . Sexual activity: Not on file  Lifestyle  . Physical activity    Days per week: Not on file    Minutes per session: Not on file  . Stress: Not on file  Relationships  . Social Herbalist on  phone: Not on file    Gets together: Not on file    Attends religious service: Not on file    Active member of club or organization: Not on file    Attends meetings of clubs or organizations: Not on file    Relationship status: Not on file  Other Topics Concern  . Not on file  Social History Narrative   "Evan Robinson"   Lives with wife   Occupation: retired, was Pharmacist, hospital   Activity: walks daily, some gym   Diet:    Past Surgical History:  Procedure Laterality Date  . CHOLECYSTECTOMY  2006  . COLONOSCOPY WITH PROPOFOL N/A 02/01/2017   Procedure: COLONOSCOPY WITH PROPOFOL;  Surgeon: Toledo, Benay Pike, MD;  Location: [facility];  Service: Gastroenterology;  Laterality: N/A;  . ESOPHAGOGASTRODUODENOSCOPY (EGD) WITH PROPOFOL N/A 02/01/2017   Procedure:  ESOPHAGOGASTRODUODENOSCOPY (EGD) WITH PROPOFOL;  Surgeon: Toledo, Benay Pike, MD;  Location: [facility];  Service: Gastroenterology;  Laterality: N/A;  . HERNIA REPAIR  08/2010  . PROSTATECTOMY  2003   Past Medical History:  Diagnosis Date  . Dyslipidemia   . H/O prostate cancer 2003   s/p radical prostatectomy (uro Dr. Rush Farmer in [place] yearly)  . Headache 2012   thorough w/u - unrevealing.  has seen 3 neurologists, MRI, LP nonacute.  ESR = 3  . HTN (hypertension)   . Hypogonadism male   . Low testosterone   . Migraines   . OSA on CPAP   . 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 Vitamin D deficiency    BP 131/81   Pulse 94   Temp 97.8 F (36.6 C)   Ht 6' (1.829 m)   Wt 191 lb (86.6 kg)   SpO2 98%   BMI 25.90 kg/m   Opioid Risk Score:   Fall Risk Score:  `1  Depression screen PHQ 2/9  Depression screen [facility] 2/9 10/18/2018 08/13/2018 07/13/2018 06/20/2018 05/09/2018 04/05/2018  Decreased Interest 0 0 0 0 0 0  Down, Depressed, Hopeless 0 0 0 0 0 0  PHQ - 2 Score 0 0 0 0 0 0     Review of Systems  Constitutional: Negative.   HENT: Negative.   Eyes: Negative.   Respiratory: Negative.   Cardiovascular: Negative.   Gastrointestinal: Negative.   Endocrine: Negative.   Genitourinary: Negative.   Musculoskeletal: Positive for arthralgias, myalgias, neck pain and neck stiffness.  Skin: Negative.   Allergic/Immunologic: Negative.   Neurological: Negative.   Hematological: Negative.   Psychiatric/Behavioral: Negative.       Objective:   Physical Exam Gen: NAD. Vital signs reviewed HENT: Normocephalic. Atraumatic.  Eyes: EOMI. No discharge.  Cardio: No JVD. Pulm: Effort normal. Abd: Non-distended.  MSK:   Gait WNL.             No TTP diffusely.               No edema.  Neuro:             Strength          5/5 in all UE myotomes Skin: Warm and Dry. Intact    Assessment & Plan:  Male with pmh of Vit D deficiency, OSA,  migraines, low testosterone, HTN, presents with burning in face and shoulders.    1. 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   Lidoderm patch/ointment, Zyrtec, Tomapax, Almond milk without benefit  Steroids made symptoms more severe  Side effects with Cymbalta  Will consider PT for neck ROM and cervical traction and TENS  Will consider referral to Psychology - CBT as rest and no activity seem to exacerbate the pain after CoVid.    Will consider Accupuncture  Will consider Vascular/Rheum referral for Raynaud like symptoms  ?Horner's like syndrome (delayed pupil relfex, history of ptosis s/p surgery)  Will consider Sympathetic ganglion block/ESIs  Patient states main 2 goals are travel and play with grandchildren  Cont 200, 200, 100 mg Lyrica, encouraged with food  Good benefit with capsaicin cream on shoulders, cont  Encouraged Mediterranean diet  Cont Mag 240m daily  Will consider trigger point injections  Will consider Accupunture  Will consider Lidocaine infusion  Will consider Gabapentin at higher doses  Will consider Botox injection  He is playing with his grandchildren more  Will consider nitropaste  Will change to Elavil to Pamelor 2.527m Trial hypnosis after Cards eval  2. Sleep disturbance  See #1  Improving  3. Vitamin B12 deficiency  Level 278 on 06/2018, Cont supplement   4. Vitamin D  Level 43 06/2018, Cont supplement

## 2019-02-17 ENCOUNTER — Other Ambulatory Visit: Payer: Self-pay | Admitting: Physical Medicine & Rehabilitation

## 2019-03-05 ENCOUNTER — Encounter: Payer: Self-pay | Admitting: Physical Medicine & Rehabilitation

## 2019-03-05 ENCOUNTER — Encounter: Payer: Medicare Other | Attending: Physical Medicine & Rehabilitation | Admitting: Physical Medicine & Rehabilitation

## 2019-03-05 ENCOUNTER — Other Ambulatory Visit: Payer: Self-pay

## 2019-03-05 VITALS — BP 152/75 | HR 60 | Temp 97.7°F | Ht 72.0 in | Wt 196.0 lb

## 2019-03-05 DIAGNOSIS — E785 Hyperlipidemia, unspecified: Secondary | ICD-10-CM | POA: Diagnosis not present

## 2019-03-05 DIAGNOSIS — G500 Trigeminal neuralgia: Secondary | ICD-10-CM

## 2019-03-05 DIAGNOSIS — G479 Sleep disorder, unspecified: Secondary | ICD-10-CM

## 2019-03-05 DIAGNOSIS — I10 Essential (primary) hypertension: Secondary | ICD-10-CM | POA: Insufficient documentation

## 2019-03-05 DIAGNOSIS — M4802 Spinal stenosis, cervical region: Secondary | ICD-10-CM | POA: Insufficient documentation

## 2019-03-05 DIAGNOSIS — M792 Neuralgia and neuritis, unspecified: Secondary | ICD-10-CM

## 2019-03-05 DIAGNOSIS — R208 Other disturbances of skin sensation: Secondary | ICD-10-CM | POA: Insufficient documentation

## 2019-03-05 DIAGNOSIS — G501 Atypical facial pain: Secondary | ICD-10-CM

## 2019-03-05 DIAGNOSIS — Z8546 Personal history of malignant neoplasm of prostate: Secondary | ICD-10-CM | POA: Insufficient documentation

## 2019-03-05 DIAGNOSIS — E559 Vitamin D deficiency, unspecified: Secondary | ICD-10-CM | POA: Diagnosis not present

## 2019-03-05 DIAGNOSIS — G4733 Obstructive sleep apnea (adult) (pediatric): Secondary | ICD-10-CM | POA: Diagnosis not present

## 2019-03-05 DIAGNOSIS — G43909 Migraine, unspecified, not intractable, without status migrainosus: Secondary | ICD-10-CM | POA: Diagnosis not present

## 2019-03-05 DIAGNOSIS — G8929 Other chronic pain: Secondary | ICD-10-CM | POA: Insufficient documentation

## 2019-03-05 MED ORDER — CARBAMAZEPINE ER 100 MG PO TB12: 100.0000 mg | ORAL_TABLET | Freq: Two times a day (BID) | ORAL | 2 refills | Status: DC

## 2019-03-05 MED ORDER — PREGABALIN 100 MG PO CAPS: 100.0000 mg | ORAL_CAPSULE | Freq: Three times a day (TID) | ORAL | 1 refills | Status: DC

## 2019-03-05 NOTE — Patient Instructions (Signed)
Take Carbamazepine 100mg  twice per day. Monitor for side effects of rash, weakness, and oversedation.   Continue Lyrica, which has been helping. Stop amytripyline which has not provided relief.   Continue exercise for active lifestyle.   Incorporate more antiinflammatory foods in your diet, such as turmeric.   I will follow-up with you by phone in 3 days to see if you are getting relief or side effects from the medication, and to adjust the dose accordingly. If the medication makes you sleepy, we can consider administering it only at night.

## 2019-03-05 NOTE — Addendum Note (Signed)
Addended by: Delice Lesch A on: 03/05/2019 11:08 AM   Modules accepted: Orders

## 2019-03-05 NOTE — Progress Notes (Signed)
Subjective:    Patient ID: Evan Robinson, male    DOB: Jan 24, 1946, 73 y.o.   MRN: [id number]   HPI  Male with pmh of Vit D deficiency, OSA, migraines, low testosterone, HTN, presents with burning in face and shoulders.   Initially stated: Started ~12/2016. He went to IM, Endo, Derm with negative workup. He had lab work for flushing and workup was negative. He saw Neurology, started on Cymbalta and Topomax without benefit. He had brain/C-spine MRI, which showed cervical stenosis. He saw Neurology and was told not radiculopathy related. Progressively getting worse.  Denies alleviating factors. Associated cold hands. Stationary positions exacerbate the pain.  Burning type pain. Radiates to all extremities at times. Denies associated weakness. Denies falls. Wants to travel and grandchildren. Pheochromocytoma workup negative. States he has tried everything. Carotid ultrasound negative per patient.   Last clinic visit 01/03/2019.  He was also seen by Dr. Adam Phenix today, case discussed. Since that time, he is taking 100 TID. No benefit or side effects with Elavil.    Pain Inventory Average Pain 8 Pain Right Now 7 My pain is burning  In the last 24 hours, has pain interfered with the following? General activity 5 Relation with others 5 Enjoyment of life 5  What TIME of day is your pain at its worst? night Sleep (in general) Fair  Pain is worse with: sitting Pain improves with: rest and medication Relief from Meds: 8  Mobility walk without assistance how many minutes can you walk? unlimited ability to climb steps?  yes do you drive?  yes  Function retired  Neuro/Psych No problems in this area  Prior Studies Any changes since last visit?  no  Physicians involved in your care Any changes since last visit?  no   Family History  Problem Relation Age of Onset  . Alcohol abuse Father   . Cancer Father        prostate  . CAD Father        possible MI  . Alcohol abuse Paternal  Grandfather   . Stroke Mother   . Diabetes Neg Hx    Social History   Socioeconomic History  . Marital status: Married    Spouse name: Not on file  . Number of children: Not on file  . Years of education: Not on file  . Highest education level: Not on file  Occupational History  . Not on file  Social Needs  . Financial resource strain: Not on file  . Food insecurity    Worry: Not on file    Inability: Not on file  . Transportation needs    Medical: Not on file    Non-medical: Not on file  Tobacco Use  . Smoking status: Never Smoker  . Smokeless tobacco: Never Used  Substance and Sexual Activity  . Alcohol use: No  . Drug use: No  . Sexual activity: Not on file  Lifestyle  . Physical activity    Days per week: Not on file    Minutes per session: Not on file  . Stress: Not on file  Relationships  . Social Herbalist on phone: Not on file    Gets together: Not on file    Attends religious service: Not on file    Active member of club or organization: Not on file    Attends meetings of clubs or organizations: Not on file    Relationship status: Not on file  Other Topics Concern  .  Not on file  Social History Narrative   "Evan Robinson"   Lives with wife   Occupation: retired, was Pharmacist, hospital   Activity: walks daily, some gym   Diet:    Past Surgical History:  Procedure Laterality Date  . CHOLECYSTECTOMY  2006  . COLONOSCOPY WITH PROPOFOL N/A 02/01/2017   Procedure: COLONOSCOPY WITH PROPOFOL;  Surgeon: Toledo, Benay Pike, MD;  Location: [facility];  Service: Gastroenterology;  Laterality: N/A;  . ESOPHAGOGASTRODUODENOSCOPY (EGD) WITH PROPOFOL N/A 02/01/2017   Procedure: ESOPHAGOGASTRODUODENOSCOPY (EGD) WITH PROPOFOL;  Surgeon: Toledo, Benay Pike, MD;  Location: [facility];  Service: Gastroenterology;  Laterality: N/A;  . HERNIA REPAIR  08/2010  . PROSTATECTOMY  2003   Past Medical History:  Diagnosis Date  . Dyslipidemia   . H/O prostate cancer 2003    s/p radical prostatectomy (uro Dr. Rush Farmer in [place] yearly)  . Headache 2012   thorough w/u - unrevealing.  has seen 3 neurologists, MRI, LP nonacute.  ESR = 3  . HTN (hypertension)   . Hypogonadism male   . Low testosterone   . Migraines   . OSA on CPAP   . 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 Vitamin D deficiency    BP (!) 152/75   Pulse 60   Temp 97.7 F (36.5 C)   Ht 6' (1.829 m)   Wt 196 lb (88.9 kg)   SpO2 98%   BMI 26.58 kg/m   Opioid Risk Score:   Fall Risk Score:  `1  Depression screen PHQ 2/9  Depression screen [facility] 2/9 10/18/2018 08/13/2018 07/13/2018 06/20/2018 05/09/2018 04/05/2018  Decreased Interest 0 0 0 0 0 0  Down, Depressed, Hopeless 0 0 0 0 0 0  PHQ - 2 Score 0 0 0 0 0 0     Review of Systems  Constitutional: Negative.   HENT: Negative.   Eyes: Negative.   Respiratory: Negative.   Cardiovascular: Negative.   Gastrointestinal: Negative.   Endocrine: Negative.   Genitourinary: Negative.   Musculoskeletal: Positive for arthralgias, back pain, myalgias, neck pain and neck stiffness.  Skin: Negative.   Allergic/Immunologic: Negative.   Neurological: Negative.   Hematological: Negative.   Psychiatric/Behavioral: Negative.       Objective:   Physical Exam  Constitutional: No distress . Vital signs reviewed. HENT: Normocephalic.  Atraumatic. Eyes: EOMI. No discharge. Cardiovascular: No JVD. Respiratory: Normal effort.  No stridor. GI: Non-distended. Skin: Warm and dry.  Intact. Psych: Normal mood.  Normal behavior. Musc: Gait WNL Neuro: Alert  Strength          5/5 in all UE myotomes Skin: Warm and Dry. Intact    Assessment & Plan:  Male with pmh of Vit D deficiency, OSA, migraines, low testosterone, HTN, presents with burning in face and shoulders.    1. 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   Lidoderm patch/ointment, Zyrtec, Tomapax, Almond milk, Elavil without benefit  Steroids made symptoms more severe  Side effects with Cymbalta  Will consider PT for neck ROM and cervical traction and TENS  Will consider referral to Psychology - CBT as rest and no activity seem to exacerbate the pain after CoVid.    Will consider Accupuncture  Will consider  Vascular/Rheum referral for Raynaud like symptoms  ?Horner's like syndrome (delayed pupil relfex, history of ptosis s/p surgery)  Will consider Sympathetic ganglion block/ESIs  Patient states main 2 goals are travel and play with grandchildren  Cont 100, 100, 100 mg Lyrica, encouraged with food  Good benefit with capsaicin cream on shoulders, cont  Encouraged Mediterranean diet  Cont Mag 260m daily  Will consider trigger point injections  Will consider Accupunture  Will consider Lidocaine infusion  Will consider Gabapentin at higher doses  Will consider Botox injection  He is playing with his grandchildren more  Will consider nitropaste  Will consider hypnosis after Cards eval   Will order Carbamazapine  2. Sleep disturbance  See #1  Improving  3. Vitamin B12 deficiency  Level 278 on 06/2018, Cont supplement   4. Vitamin D  Level 43 06/2018, Cont supplement   >30 minutes spent in total with >25 minutes in counseling and coordination of care regarding aforementioned

## 2019-03-16 ENCOUNTER — Other Ambulatory Visit: Payer: Self-pay | Admitting: Physical Medicine & Rehabilitation

## 2019-03-18 ENCOUNTER — Telehealth: Payer: Self-pay

## 2019-03-18 NOTE — Telephone Encounter (Signed)
Patient called stating that [facility] stated that his Pregabalin 100 mg was denied from doctor. I called pharmacy they state never received 11/3 prescription. Called in Pregabalin 100 mg one TID # 90 with 1 refill.

## 2019-04-04 ENCOUNTER — Encounter: Payer: Self-pay | Admitting: Physical Medicine & Rehabilitation

## 2019-04-04 ENCOUNTER — Encounter: Payer: Medicare Other | Attending: Physical Medicine & Rehabilitation | Admitting: Physical Medicine & Rehabilitation

## 2019-04-04 ENCOUNTER — Other Ambulatory Visit: Payer: Self-pay

## 2019-04-04 VITALS — BP 119/72 | HR 50 | Temp 98.3°F | Ht 72.0 in | Wt 197.2 lb

## 2019-04-04 DIAGNOSIS — G4733 Obstructive sleep apnea (adult) (pediatric): Secondary | ICD-10-CM | POA: Diagnosis not present

## 2019-04-04 DIAGNOSIS — G8929 Other chronic pain: Secondary | ICD-10-CM | POA: Diagnosis not present

## 2019-04-04 DIAGNOSIS — E559 Vitamin D deficiency, unspecified: Secondary | ICD-10-CM | POA: Insufficient documentation

## 2019-04-04 DIAGNOSIS — E785 Hyperlipidemia, unspecified: Secondary | ICD-10-CM | POA: Diagnosis not present

## 2019-04-04 DIAGNOSIS — G501 Atypical facial pain: Secondary | ICD-10-CM | POA: Diagnosis not present

## 2019-04-04 DIAGNOSIS — G43909 Migraine, unspecified, not intractable, without status migrainosus: Secondary | ICD-10-CM | POA: Insufficient documentation

## 2019-04-04 DIAGNOSIS — R208 Other disturbances of skin sensation: Secondary | ICD-10-CM | POA: Diagnosis present

## 2019-04-04 DIAGNOSIS — I10 Essential (primary) hypertension: Secondary | ICD-10-CM | POA: Diagnosis not present

## 2019-04-04 DIAGNOSIS — M792 Neuralgia and neuritis, unspecified: Secondary | ICD-10-CM | POA: Diagnosis not present

## 2019-04-04 DIAGNOSIS — Z8546 Personal history of malignant neoplasm of prostate: Secondary | ICD-10-CM | POA: Insufficient documentation

## 2019-04-04 DIAGNOSIS — G479 Sleep disorder, unspecified: Secondary | ICD-10-CM

## 2019-04-04 DIAGNOSIS — M4802 Spinal stenosis, cervical region: Secondary | ICD-10-CM | POA: Diagnosis not present

## 2019-04-04 DIAGNOSIS — G500 Trigeminal neuralgia: Secondary | ICD-10-CM | POA: Insufficient documentation

## 2019-04-04 NOTE — Progress Notes (Signed)
Subjective:    Patient ID: Evan Robinson, male    DOB: March 14, 1946, 73 y.o.   MRN: [id number]   HPI  Male with pmh of Vit D deficiency, OSA, migraines, low testosterone, HTN, presents with burning in face and shoulders.   Initially stated: Started ~12/2016. He went to IM, Endo, Derm with negative workup. He had lab work for flushing and workup was negative. He saw Neurology, started on Cymbalta and Topomax without benefit. He had brain/C-spine MRI, which showed cervical stenosis. He saw Neurology and was told not radiculopathy related. Progressively getting worse.  Denies alleviating factors. Associated cold hands. Stationary positions exacerbate the pain.  Burning type pain. Radiates to all extremities at times. Denies associated weakness. Denies falls. Wants to travel and grandchildren. Pheochromocytoma workup negative. States he has tried everything. Carotid ultrasound negative per patient.   Last clinic visit on 03/05/2019.  Since that time, he continues to be on Lyrica. He continues to use capsaicin 1/week. Sleep has improved. He did not try carbamazepine due to interaction with Eliquis.   Pain Inventory Average Pain 8 Pain Right Now 8 My pain is burning  In the last 24 hours, has pain interfered with the following? General activity 3 Relation with others 2 Enjoyment of life 4  What TIME of day is your pain at its worst? evening and night Sleep (in general) Fair  Pain is worse with: sitting Pain improves with: rest and medication Relief from Meds: 3  Mobility walk without assistance Do you have any goals in this area?  no  Function Do you have any goals in this area?  no  Neuro/Psych No problems in this area  Prior Studies Any changes since last visit?  no  Physicians involved in your care Any changes since last visit?  no   Family History  Problem Relation Age of Onset  . Alcohol abuse Father   . Cancer Father        prostate  . CAD Father        possible MI  .  Alcohol abuse Paternal Grandfather   . Stroke Mother   . Diabetes Neg Hx    Social History   Socioeconomic History  . Marital status: Married    Spouse name: Not on file  . Number of children: Not on file  . Years of education: Not on file  . Highest education level: Not on file  Occupational History  . Not on file  Social Needs  . Financial resource strain: Not on file  . Food insecurity    Worry: Not on file    Inability: Not on file  . Transportation needs    Medical: Not on file    Non-medical: Not on file  Tobacco Use  . Smoking status: Never Smoker  . Smokeless tobacco: Never Used  Substance and Sexual Activity  . Alcohol use: No  . Drug use: No  . Sexual activity: Not on file  Lifestyle  . Physical activity    Days per week: Not on file    Minutes per session: Not on file  . Stress: Not on file  Relationships  . Social Herbalist on phone: Not on file    Gets together: Not on file    Attends religious service: Not on file    Active member of club or organization: Not on file    Attends meetings of clubs or organizations: Not on file    Relationship status: Not on file  Other Topics Concern  . Not on file  Social History Narrative   "Gene"   Lives with wife   Occupation: retired, was Pharmacist, hospital   Activity: walks daily, some gym   Diet:    Past Surgical History:  Procedure Laterality Date  . CHOLECYSTECTOMY  2006  . COLONOSCOPY WITH PROPOFOL N/A 02/01/2017   Procedure: COLONOSCOPY WITH PROPOFOL;  Surgeon: Toledo, Benay Pike, MD;  Location: [facility];  Service: Gastroenterology;  Laterality: N/A;  . ESOPHAGOGASTRODUODENOSCOPY (EGD) WITH PROPOFOL N/A 02/01/2017   Procedure: ESOPHAGOGASTRODUODENOSCOPY (EGD) WITH PROPOFOL;  Surgeon: Toledo, Benay Pike, MD;  Location: [facility];  Service: Gastroenterology;  Laterality: N/A;  . HERNIA REPAIR  08/2010  . PROSTATECTOMY  2003   Past Medical History:  Diagnosis Date  . Dyslipidemia   . H/O  prostate cancer 2003   s/p radical prostatectomy (uro Dr. Rush Farmer in [place] yearly)  . Headache 2012   thorough w/u - unrevealing.  has seen 3 neurologists, MRI, LP nonacute.  ESR = 3  . HTN (hypertension)   . Hypogonadism male   . Low testosterone   . Migraines   . OSA on CPAP   . 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 Vitamin D deficiency    BP 119/72   Pulse (!) 50   Temp 98.3 F (36.8 C)   Ht 6' (1.829 m)   Wt 197 lb 3.2 oz (89.4 kg)   BMI 26.75 kg/m   Opioid Risk Score:   Fall Risk Score:  `1  Depression screen PHQ 2/9  Depression screen [facility] 2/9 04/04/2019 10/18/2018 08/13/2018 07/13/2018 06/20/2018 05/09/2018 04/05/2018  Decreased Interest 0 0 0 0 0 0 0  Down, Depressed, Hopeless 0 0 0 0 0 0 0  PHQ - 2 Score 0 0 0 0 0 0 0     Review of Systems  Constitutional: Negative.   HENT: Negative.   Eyes: Negative.   Respiratory: Negative.   Cardiovascular: Negative.   Gastrointestinal: Negative.   Endocrine: Negative.   Genitourinary: Negative.   Musculoskeletal: Positive for arthralgias, back pain, myalgias, neck pain and neck stiffness.  Skin: Negative.   Allergic/Immunologic: Negative.   Neurological: Negative.   Hematological: Negative.   Psychiatric/Behavioral: Negative.       Objective:   Physical Exam  Constitutional: No distress . Vital signs reviewed. HENT: Normocephalic.  Atraumatic. Eyes: EOMI. No discharge. Cardiovascular: No JVD. Respiratory: Normal effort.  No stridor. GI: Non-distended. Skin: Warm and dry.  Intact. Psych: Normal mood.  Normal behavior. Musc: No edema in extremities.  No tenderness in extremities. Gait: WNL Neuro: Alert  Strength          5/5 in all UE myotomes Skin: Warm and Dry. Intact    Assessment & Plan:  Male with pmh of Vit D deficiency, OSA, migraines, low testosterone, HTN, presents with burning in face and shoulders.    1. 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   Lidoderm patch/ointment, Zyrtec, Tomapax, Almond milk, Elavil without benefit  Steroids made symptoms more severe  Side effects with Cymbalta  Will consider PT for neck ROM and cervical traction and TENS  Will consider referral to Psychology - CBT as rest and no activity  seem to exacerbate the pain    Patient to inquire about Accupuncture  Will consider Vascular/Rheum referral for Raynaud like symptoms  ?Horner's like syndrome (delayed pupil relfex, history of ptosis s/p surgery)  Will consider Sympathetic ganglion block/ESIs  Patient states main 2 goals are travel and play with grandchildren  Cont 100, 50, 100 mg Lyrica, encouraged with food  Good benefit with capsaicin cream on shoulders, cont  Encouraged Mediterranean diet  Cont Mag 248m daily  Will consider trigger point injections  Will consider Lidocaine infusion  Will consider Gabapentin at higher doses  Will consider Botox injection  Will consider nitropaste  Patient to inquire about hypnosis   Will consider Carbamazapine if d/cing Eliquis  2. Sleep disturbance  See #1  Improving  3. Vitamin B12 deficiency  Level 278 on 06/2018, Cont supplement   4. Vitamin D  Level 43 06/2018, Cont supplement

## 2019-05-27 ENCOUNTER — Other Ambulatory Visit: Payer: Self-pay | Admitting: Physical Medicine & Rehabilitation

## 2019-05-27 NOTE — Telephone Encounter (Signed)
Recieved electronic medication refill request for lyrica.  Due to recent change in pharmacies, pregabalin (lyrica) can no longer be called in and now must be E-scribed.

## 2019-05-30 ENCOUNTER — Other Ambulatory Visit: Payer: Self-pay

## 2019-05-30 ENCOUNTER — Encounter: Payer: Self-pay | Admitting: Physical Medicine & Rehabilitation

## 2019-05-30 ENCOUNTER — Encounter: Payer: Medicare PPO | Attending: Physical Medicine & Rehabilitation | Admitting: Physical Medicine & Rehabilitation

## 2019-05-30 VITALS — BP 147/80 | HR 60 | Temp 97.8°F | Ht 72.0 in | Wt 196.0 lb

## 2019-05-30 DIAGNOSIS — Z8546 Personal history of malignant neoplasm of prostate: Secondary | ICD-10-CM | POA: Diagnosis not present

## 2019-05-30 DIAGNOSIS — G500 Trigeminal neuralgia: Secondary | ICD-10-CM

## 2019-05-30 DIAGNOSIS — E559 Vitamin D deficiency, unspecified: Secondary | ICD-10-CM | POA: Insufficient documentation

## 2019-05-30 DIAGNOSIS — G8929 Other chronic pain: Secondary | ICD-10-CM | POA: Insufficient documentation

## 2019-05-30 DIAGNOSIS — G501 Atypical facial pain: Secondary | ICD-10-CM

## 2019-05-30 DIAGNOSIS — E785 Hyperlipidemia, unspecified: Secondary | ICD-10-CM | POA: Insufficient documentation

## 2019-05-30 DIAGNOSIS — E538 Deficiency of other specified B group vitamins: Secondary | ICD-10-CM | POA: Diagnosis present

## 2019-05-30 DIAGNOSIS — G43909 Migraine, unspecified, not intractable, without status migrainosus: Secondary | ICD-10-CM | POA: Insufficient documentation

## 2019-05-30 DIAGNOSIS — G479 Sleep disorder, unspecified: Secondary | ICD-10-CM

## 2019-05-30 DIAGNOSIS — M4802 Spinal stenosis, cervical region: Secondary | ICD-10-CM | POA: Diagnosis not present

## 2019-05-30 DIAGNOSIS — G4733 Obstructive sleep apnea (adult) (pediatric): Secondary | ICD-10-CM | POA: Diagnosis not present

## 2019-05-30 DIAGNOSIS — I10 Essential (primary) hypertension: Secondary | ICD-10-CM | POA: Diagnosis not present

## 2019-05-30 DIAGNOSIS — R208 Other disturbances of skin sensation: Secondary | ICD-10-CM | POA: Diagnosis present

## 2019-05-30 DIAGNOSIS — M792 Neuralgia and neuritis, unspecified: Secondary | ICD-10-CM | POA: Diagnosis not present

## 2019-05-30 MED ORDER — PREGABALIN 50 MG PO CAPS: 50.0000 mg | ORAL_CAPSULE | Freq: Every day | ORAL | 2 refills | Status: DC

## 2019-05-30 NOTE — Progress Notes (Addendum)
Subjective:    Patient ID: Evan Robinson, male    DOB: November 03, 1945, 74 y.o.   MRN: [id number]   HPI  Male with pmh of Vit D deficiency, OSA, migraines, low testosterone, HTN, presents with burning in face and shoulders.   Initially stated: Started ~12/2016. He went to IM, Endo, Derm with negative workup. He had lab work for flushing and workup was negative. He saw Neurology, started on Cymbalta and Topomax without benefit. He had brain/C-spine MRI, which showed cervical stenosis. He saw Neurology and was told not radiculopathy related. Progressively getting worse.  Denies alleviating factors. Associated cold hands. Stationary positions exacerbate the pain.  Burning type pain. Radiates to all extremities at times. Denies associated weakness. Denies falls. Wants to travel and grandchildren. Pheochromocytoma workup negative. States he has tried everything. Carotid ultrasound negative per patient.   Last clinic visit on 04/04/2019. Since that time, he states he has not found anyone for hypnotherapy. He continues to take Lyrica. He states he believes his wife has been trying mediterranean diet. He is still on the Eliquis.  Sleep is okay for the most part. He is taking Vitamin supplements.   Pain Inventory Average Pain 7 Pain Right Now 0 My pain is burning  In the last 24 hours, has pain interfered with the following? General activity 4 Relation with others 4 Enjoyment of life 4  What TIME of day is your pain at its worst? evening Sleep (in general) Fair  Pain is worse with: sitting Pain improves with: na Relief from Meds: na  Mobility walk without assistance Do you have any goals in this area?  no  Function Do you have any goals in this area?  no  Neuro/Psych No problems in this area  Prior Studies Any changes since last visit?  no  Physicians involved in your care Any changes since last visit?  no   Family History  Problem Relation Age of Onset  . Alcohol abuse Father   .  Cancer Father        prostate  . CAD Father        possible MI  . Alcohol abuse Paternal Grandfather   . Stroke Mother   . Diabetes Neg Hx    Social History   Socioeconomic History  . Marital status: Married    Spouse name: Not on file  . Number of children: Not on file  . Years of education: Not on file  . Highest education level: Not on file  Occupational History  . Not on file  Tobacco Use  . Smoking status: Never Smoker  . Smokeless tobacco: Never Used  Substance and Sexual Activity  . Alcohol use: No  . Drug use: No  . Sexual activity: Not on file  Other Topics Concern  . Not on file  Social History Narrative   "Gene"   Lives with wife   Occupation: retired, was Pharmacist, hospital   Activity: walks daily, some gym   Diet:    Social Determinants of Radio broadcast assistant Strain:   . Difficulty of Paying Living Expenses: Not on file  Food Insecurity:   . Worried About Charity fundraiser in the Last Year: Not on file  . Ran Out of Food in the Last Year: Not on file  Transportation Needs:   . Lack of Transportation (Medical): Not on file  . Lack of Transportation (Non-Medical): Not on file  Physical Activity:   . Days of Exercise per  Week: Not on file  . Minutes of Exercise per Session: Not on file  Stress:   . Feeling of Stress : Not on file  Social Connections:   . Frequency of Communication with Friends and Family: Not on file  . Frequency of Social Gatherings with Friends and Family: Not on file  . Attends Religious Services: Not on file  . Active Member of Clubs or Organizations: Not on file  . Attends Archivist Meetings: Not on file  . Marital Status: Not on file   Past Surgical History:  Procedure Laterality Date  . CHOLECYSTECTOMY  2006  . COLONOSCOPY WITH PROPOFOL N/A 02/01/2017   Procedure: COLONOSCOPY WITH PROPOFOL;  Surgeon: Toledo, Benay Pike, MD;  Location: [facility] ENDOSCOPY;  Service: Gastroenterology;  Laterality: N/A;  .  ESOPHAGOGASTRODUODENOSCOPY (EGD) WITH PROPOFOL N/A 02/01/2017   Procedure: ESOPHAGOGASTRODUODENOSCOPY (EGD) WITH PROPOFOL;  Surgeon: Toledo, Benay Pike, MD;  Location: [facility] ENDOSCOPY;  Service: Gastroenterology;  Laterality: N/A;  . HERNIA REPAIR  08/2010  . PROSTATECTOMY  2003   Past Medical History:  Diagnosis Date  . Dyslipidemia   . H/O prostate cancer 2003   s/p radical prostatectomy (uro Dr. Rush Farmer in [place] yearly)  . Headache 2012   thorough w/u - unrevealing.  has seen 3 neurologists, MRI, LP nonacute.  ESR = 3  . HTN (hypertension)   . Hypogonadism male   . Low testosterone   . Migraines   . OSA on CPAP   . 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 Vitamin D deficiency    BP (!) 147/80   Pulse 60   Temp 97.8 F (36.6 C)   Ht 6' (1.829 m)   Wt 196 lb (88.9 kg)   SpO2 98%   BMI 26.58 kg/m   Opioid Risk Score:   Fall Risk Score:  `1  Depression screen PHQ 2/9  Depression screen [facility] 2/9 04/04/2019 10/18/2018 08/13/2018 07/13/2018 06/20/2018 05/09/2018 04/05/2018  Decreased Interest 0 0 0 0 0 0 0  Down, Depressed, Hopeless 0 0 0 0 0 0 0  PHQ - 2 Score 0 0 0 0 0 0 0     Review of Systems  Constitutional: Negative.   HENT: Negative.   Eyes: Negative.   Respiratory: Negative.   Cardiovascular: Negative.   Gastrointestinal: Negative.   Endocrine: Negative.   Genitourinary: Negative.   Musculoskeletal: Positive for arthralgias, back pain and neck pain.  Skin: Negative.   Allergic/Immunologic: Negative.   Neurological: Negative.   Hematological: Negative.   Psychiatric/Behavioral: Negative.       Objective:   Physical Exam  Constitutional: No distress . Vital signs reviewed. HENT: Normocephalic.  Atraumatic. Eyes: EOMI. No discharge. Cardiovascular: No JVD. Respiratory: Normal effort.  No stridor. GI: Non-distended. Skin: Warm and dry.  Intact. Psych: Normal mood.  Normal behavior. Musc: No edema in extremities.  No  tenderness in extremities. Gait: WNL Neuro: Alert  Strength          5/5 in all UE myotomes Skin: Warm and Dry. Intact    Assessment & Plan:  Male with pmh of Vit D deficiency, OSA, migraines, low testosterone, HTN, presents with burning in face and shoulders.    1. 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   Lidoderm patch/ointment, Zyrtec, Tomapax, Almond milk, Elavil without benefit  Unable to trial carbamazepine as patient is on Eliquis  Steroids made symptoms more severe  Side effects with Cymbalta  Will consider PT for neck ROM and cervical traction and TENS  Will consider referral to Psychology - CBT as rest and no activity seem to exacerbate the pain    Patient to inquire about Accupuncture if necessary after trial hypnotherapy  Will consider Vascular/Rheum referral for Raynaud like symptoms  ?Horner's like syndrome (delayed pupil relfex, history of ptosis s/p surgery)  Will consider Sympathetic ganglion block/ESIs  Patient states main 2 goals are travel and play with grandchildren  Cont 100, 50, 100 mg Lyrica, encouraged with food  Good benefit with capsaicin cream on shoulders, Cont  Encouraged Mediterranean diet, believes his wife is following  Cont Mag 259m daily  Will consider trigger point injections  Will consider Lidocaine infusion  Will consider Gabapentin at higher doses  Will consider Botox injection  Will consider nitropaste   Will consider oxcarbazepine 154mBID, with titration up to 900 BID if necessary.   Patient to follow up regarding hypnosis   2. Sleep disturbance  See #1  Improving overall  3. Vitamin B12 deficiency  Level 278 on 06/2018, Cont supplement   Repeat labs ordered   4. Vitamin  D  Level 43 06/2018, Cont supplement  Repeat labs ordered

## 2019-06-06 LAB — VITAMIN B12: Vitamin B-12: 558 pg/mL (ref 232–1245)

## 2019-06-06 LAB — VITAMIN D 25 HYDROXY (VIT D DEFICIENCY, FRACTURES): Vit D, 25-Hydroxy: 29.6 ng/mL — ABNORMAL LOW (ref 30.0–100.0)

## 2019-06-11 ENCOUNTER — Ambulatory Visit: Payer: Medicare PPO

## 2019-06-13 ENCOUNTER — Ambulatory Visit: Payer: Medicare PPO

## 2019-06-27 ENCOUNTER — Encounter: Payer: Self-pay | Admitting: Physical Medicine & Rehabilitation

## 2019-06-27 ENCOUNTER — Other Ambulatory Visit: Payer: Self-pay

## 2019-06-27 ENCOUNTER — Encounter: Payer: Medicare PPO | Attending: Physical Medicine & Rehabilitation | Admitting: Physical Medicine & Rehabilitation

## 2019-06-27 VITALS — Ht 72.0 in | Wt 190.0 lb

## 2019-06-27 DIAGNOSIS — R208 Other disturbances of skin sensation: Secondary | ICD-10-CM | POA: Diagnosis present

## 2019-06-27 DIAGNOSIS — I10 Essential (primary) hypertension: Secondary | ICD-10-CM | POA: Insufficient documentation

## 2019-06-27 DIAGNOSIS — G43909 Migraine, unspecified, not intractable, without status migrainosus: Secondary | ICD-10-CM | POA: Diagnosis not present

## 2019-06-27 DIAGNOSIS — G4733 Obstructive sleep apnea (adult) (pediatric): Secondary | ICD-10-CM | POA: Insufficient documentation

## 2019-06-27 DIAGNOSIS — E785 Hyperlipidemia, unspecified: Secondary | ICD-10-CM | POA: Insufficient documentation

## 2019-06-27 DIAGNOSIS — M4802 Spinal stenosis, cervical region: Secondary | ICD-10-CM | POA: Insufficient documentation

## 2019-06-27 DIAGNOSIS — G500 Trigeminal neuralgia: Secondary | ICD-10-CM | POA: Diagnosis present

## 2019-06-27 DIAGNOSIS — E559 Vitamin D deficiency, unspecified: Secondary | ICD-10-CM

## 2019-06-27 DIAGNOSIS — M792 Neuralgia and neuritis, unspecified: Secondary | ICD-10-CM

## 2019-06-27 DIAGNOSIS — G8929 Other chronic pain: Secondary | ICD-10-CM | POA: Insufficient documentation

## 2019-06-27 DIAGNOSIS — G501 Atypical facial pain: Secondary | ICD-10-CM | POA: Diagnosis not present

## 2019-06-27 DIAGNOSIS — Z8546 Personal history of malignant neoplasm of prostate: Secondary | ICD-10-CM | POA: Insufficient documentation

## 2019-06-27 DIAGNOSIS — E538 Deficiency of other specified B group vitamins: Secondary | ICD-10-CM | POA: Diagnosis present

## 2019-06-27 NOTE — Progress Notes (Signed)
Subjective:    Patient ID: Evan Robinson, male    DOB: 25-Sep-1945, 74 y.o.   MRN: [id number]   TELEHEALTH NOTE  Due to national recommendations of social distancing due to COVID 19, an audio/video telehealth visit is felt to be most appropriate for this patient at this time.  See Chart message from today for the patient's consent to telehealth from [place].     I verified that I am speaking with the correct person using two identifiers.  Location of patient: [place], museum/gallery of provider: Office Method of communication: WebEx Names of participants : Zorita Pang scheduling, Marland Mcalpine obtaining consent and vitals if available Established patient  HPI  Male with pmh of Vit D deficiency, OSA, migraines, low testosterone, HTN, presents with burning in face and shoulders.   Initially stated: Started ~12/2016. He went to IM, Endo, Derm with negative workup. He had lab work for flushing and workup was negative. He saw Neurology, started on Cymbalta and Topomax without benefit. He had brain/C-spine MRI, which showed cervical stenosis. He saw Neurology and was told not radiculopathy related. Progressively getting worse.  Denies alleviating factors. Associated cold hands. Stationary positions exacerbate the pain.  Burning type pain. Radiates to all extremities at times. Denies associated weakness. Denies falls. Wants to travel and grandchildren. Pheochromocytoma workup negative. States he has tried everything. Carotid ultrasound negative per patient.   Last clinic visit on 05/22/2019.  Since that time, patient states his pain has slightly improved compared to last visit.  He continues to take his Lyrica, with benefit.  He has not reached out to hypnotist, but states that he will call make an appointment today.  He did obtain vitamin D levels, which were borderline low.  Pain Inventory Average Pain 7 Pain Right Now 0 My pain is burning  In the last 24 hours, has  pain interfered with the following? General activity 4 Relation with others 4 Enjoyment of life 4  What TIME of day is your pain at its worst? evening Sleep (in general) Fair  Pain is worse with: sitting Pain improves with: na Relief from Meds: na  Mobility walk without assistance Do you have any goals in this area?  no  Function retired  Neuro/Psych No problems in this area  Prior Studies Any changes since last visit?  no  Physicians involved in your care Any changes since last visit?  no   Family History  Problem Relation Age of Onset  . Alcohol abuse Father   . Cancer Father        prostate  . CAD Father        possible MI  . Alcohol abuse Paternal Grandfather   . Stroke Mother   . Diabetes Neg Hx    Social History   Socioeconomic History  . Marital status: Married    Spouse name: Not on file  . Number of children: Not on file  . Years of education: Not on file  . Highest education level: Not on file  Occupational History  . Not on file  Tobacco Use  . Smoking status: Never Smoker  . Smokeless tobacco: Never Used  Substance and Sexual Activity  . Alcohol use: No  . Drug use: No  . Sexual activity: Not on file  Other Topics Concern  . Not on file  Social History Narrative   "Gene"   Lives with wife   Occupation: retired, was Pharmacist, hospital   Activity: walks  daily, some gym   Diet:    Social Determinants of Health   Financial Resource Strain:   . Difficulty of Paying Living Expenses: Not on file  Food Insecurity:   . Worried About Charity fundraiser in the Last Year: Not on file  . Ran Out of Food in the Last Year: Not on file  Transportation Needs:   . Lack of Transportation (Medical): Not on file  . Lack of Transportation (Non-Medical): Not on file  Physical Activity:   . Days of Exercise per Week: Not on file  . Minutes of Exercise per Session: Not on file  Stress:   . Feeling of Stress : Not on file  Social Connections:   .  Frequency of Communication with Friends and Family: Not on file  . Frequency of Social Gatherings with Friends and Family: Not on file  . Attends Religious Services: Not on file  . Active Member of Clubs or Organizations: Not on file  . Attends Archivist Meetings: Not on file  . Marital Status: Not on file   Past Surgical History:  Procedure Laterality Date  . CHOLECYSTECTOMY  2006  . COLONOSCOPY WITH PROPOFOL N/A 02/01/2017   Procedure: COLONOSCOPY WITH PROPOFOL;  Surgeon: Toledo, Benay Pike, MD;  Location: [facility] ENDOSCOPY;  Service: Gastroenterology;  Laterality: N/A;  . ESOPHAGOGASTRODUODENOSCOPY (EGD) WITH PROPOFOL N/A 02/01/2017   Procedure: ESOPHAGOGASTRODUODENOSCOPY (EGD) WITH PROPOFOL;  Surgeon: Toledo, Benay Pike, MD;  Location: [facility] ENDOSCOPY;  Service: Gastroenterology;  Laterality: N/A;  . HERNIA REPAIR  08/2010  . PROSTATECTOMY  2003   Past Medical History:  Diagnosis Date  . Dyslipidemia   . H/O prostate cancer 2003   s/p radical prostatectomy (uro Dr. Rush Farmer in [place] yearly)  . Headache 2012   thorough w/u - unrevealing.  has seen 3 neurologists, MRI, LP nonacute.  ESR = 3  . HTN (hypertension)   . Hypogonadism male   . Low testosterone   . Migraines   . OSA on CPAP   . 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 Vitamin D deficiency    Ht 6' (1.829 m)   Wt 190 lb (86.2 kg)   BMI 25.77 kg/m   Opioid Risk Score:   Fall Risk Score:  `1  Depression screen PHQ 2/9  Depression screen [facility] 2/9 04/04/2019 10/18/2018 08/13/2018 07/13/2018 06/20/2018 05/09/2018 04/05/2018  Decreased Interest 0 0 0 0 0 0 0  Down, Depressed, Hopeless 0 0 0 0 0 0 0  PHQ - 2 Score 0 0 0 0 0 0 0     Review of Systems  Constitutional: Negative.   HENT: Negative.   Eyes: Negative.   Respiratory: Negative.   Cardiovascular: Negative.   Gastrointestinal: Negative.   Endocrine: Negative.   Genitourinary: Negative.   Musculoskeletal: Positive  for arthralgias, back pain and neck pain.  Skin: Negative.   Allergic/Immunologic: Negative.   Neurological: Negative.   Hematological: Negative.   Psychiatric/Behavioral: Negative.       Objective:   Physical Exam  Constitutional: No distress .  Respiratory: Normal effort.   Psych: Normal mood.  Normal behavior. Neuro: Alert    Assessment & Plan:  Male with pmh of Vit D deficiency, OSA, migraines, low testosterone, HTN, presents with burning in face and shoulders.    1. 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   Lidoderm patch/ointment, Zyrtec, Tomapax, Almond milk, Elavil without benefit  Unable to trial carbamazepine as patient is on Eliquis  Steroids made symptoms more severe  Side effects with Cymbalta  Will consider PT for neck ROM and cervical traction and TENS  Will consider referral to Psychology - CBT as rest and no activity seem to exacerbate the pain    Patient to inquire about Accupuncture if necessary after trial hypnotherapy  Will consider Vascular/Rheum referral for Raynaud like symptoms  ?Horner's like syndrome (delayed pupil relfex, history of ptosis s/p surgery)  Will consider Sympathetic ganglion block/ESIs  Patient states main 2 goals are travel and play with grandchildren  Continue 100, 88, 100 mg Lyrica, encouraged with food  Good benefit with capsaicin cream on shoulders, continue  Encouraged Mediterranean diet, believes his wife is following  Cont Mag 210m daily  Will consider trigger point injections  Will consider Lidocaine infusion  Will consider Gabapentin at higher doses  Will consider Botox injection  Will consider nitropaste   Will consider oxcarbazepine 159mBID, with titration up to 900  BID if necessary after evaluation by hypnotist.   Patient to follow up regarding hypnosis, states he will call to schedule appointment today.  2. Sleep disturbance  See #1  Improving overall  3. Vitamin B12 deficiency  Level 558 on 2/3, continue supplement   4. Vitamin D  Level 29 on 2/3  Increase supplement to 4000 units daily

## 2019-07-25 ENCOUNTER — Encounter: Payer: Medicare PPO | Admitting: Physical Medicine & Rehabilitation

## 2019-08-20 ENCOUNTER — Encounter: Payer: Medicare PPO | Attending: Physical Medicine & Rehabilitation | Admitting: Physical Medicine & Rehabilitation

## 2019-08-20 ENCOUNTER — Encounter: Payer: Self-pay | Admitting: Physical Medicine & Rehabilitation

## 2019-08-20 ENCOUNTER — Other Ambulatory Visit: Payer: Self-pay

## 2019-08-20 VITALS — BP 139/80 | HR 59 | Temp 97.5°F | Ht 73.0 in | Wt 200.0 lb

## 2019-08-20 DIAGNOSIS — E785 Hyperlipidemia, unspecified: Secondary | ICD-10-CM | POA: Diagnosis not present

## 2019-08-20 DIAGNOSIS — M4802 Spinal stenosis, cervical region: Secondary | ICD-10-CM | POA: Diagnosis not present

## 2019-08-20 DIAGNOSIS — G501 Atypical facial pain: Secondary | ICD-10-CM | POA: Diagnosis not present

## 2019-08-20 DIAGNOSIS — G8929 Other chronic pain: Secondary | ICD-10-CM | POA: Insufficient documentation

## 2019-08-20 DIAGNOSIS — E538 Deficiency of other specified B group vitamins: Secondary | ICD-10-CM | POA: Insufficient documentation

## 2019-08-20 DIAGNOSIS — G43909 Migraine, unspecified, not intractable, without status migrainosus: Secondary | ICD-10-CM | POA: Insufficient documentation

## 2019-08-20 DIAGNOSIS — G500 Trigeminal neuralgia: Secondary | ICD-10-CM | POA: Insufficient documentation

## 2019-08-20 DIAGNOSIS — E559 Vitamin D deficiency, unspecified: Secondary | ICD-10-CM | POA: Insufficient documentation

## 2019-08-20 DIAGNOSIS — R208 Other disturbances of skin sensation: Secondary | ICD-10-CM | POA: Insufficient documentation

## 2019-08-20 DIAGNOSIS — G479 Sleep disorder, unspecified: Secondary | ICD-10-CM

## 2019-08-20 DIAGNOSIS — M792 Neuralgia and neuritis, unspecified: Secondary | ICD-10-CM | POA: Diagnosis not present

## 2019-08-20 DIAGNOSIS — G4733 Obstructive sleep apnea (adult) (pediatric): Secondary | ICD-10-CM | POA: Insufficient documentation

## 2019-08-20 DIAGNOSIS — Z8546 Personal history of malignant neoplasm of prostate: Secondary | ICD-10-CM | POA: Insufficient documentation

## 2019-08-20 DIAGNOSIS — I10 Essential (primary) hypertension: Secondary | ICD-10-CM | POA: Insufficient documentation

## 2019-08-20 MED ORDER — PREGABALIN 100 MG PO CAPS: 100.0000 mg | ORAL_CAPSULE | Freq: Three times a day (TID) | ORAL | 1 refills | Status: DC

## 2019-08-20 MED ORDER — PREGABALIN 50 MG PO CAPS: 50.0000 mg | ORAL_CAPSULE | Freq: Every day | ORAL | 2 refills | Status: DC

## 2019-08-20 NOTE — Progress Notes (Addendum)
Subjective:    Patient ID: Evan Robinson, male    DOB: 08-Oct-1945, 74 y.o.   MRN: [id number]  HPI  Male with pmh of Vit D deficiency, OSA, migraines, low testosterone, HTN, presents with burning in face and shoulders.   Initially stated: Started ~12/2016. He went to IM, Endo, Derm with negative workup. He had lab work for flushing and workup was negative. He saw Neurology, started on Cymbalta and Topomax without benefit. He had brain/C-spine MRI, which showed cervical stenosis. He saw Neurology and was told not radiculopathy related. Progressively getting worse.  Denies alleviating factors. Associated cold hands. Stationary positions exacerbate the pain.  Burning type pain. Radiates to all extremities at times. Denies associated weakness. Denies falls. Wants to travel and grandchildren. Pheochromocytoma workup negative. States he has tried everything. Carotid ultrasound negative per patient.   Last clinic visit on 06/27/19. Since that time he went to hypnosis.  Paperwork regarding pufferfish poising sent, reviewed.  He recently started hypnosis. He is taking increased dose of Vit. D. He is no longer using capsicin.   Pain Inventory Average Pain 7 Pain Right Now 7 My pain is burning  In the last 24 hours, has pain interfered with the following? General activity 4 Relation with others 4 Enjoyment of life 4  What TIME of day is your pain at its worst? evening, night Sleep (in general) Fair  Pain is worse with: sitting and inactivity Pain improves with: rest and medication Relief from Meds: 5  Mobility walk without assistance Do you have any goals in this area?  no  Function retired Do you have any goals in this area?  no  Neuro/Psych No problems in this area  Prior Studies Any changes since last visit?  no  Physicians involved in your care Any changes since last visit?  no   Family History  Problem Relation Age of Onset  . Alcohol abuse Father   . Cancer Father    prostate  . CAD Father        possible MI  . Alcohol abuse Paternal Grandfather   . Stroke Mother   . Diabetes Neg Hx    Social History   Socioeconomic History  . Marital status: Married    Spouse name: Not on file  . Number of children: Not on file  . Years of education: Not on file  . Highest education level: Not on file  Occupational History  . Not on file  Tobacco Use  . Smoking status: Never Smoker  . Smokeless tobacco: Never Used  Substance and Sexual Activity  . Alcohol use: No  . Drug use: No  . Sexual activity: Not on file  Other Topics Concern  . Not on file  Social History Narrative   "Gene"   Lives with wife   Occupation: retired, was Pharmacist, hospital   Activity: walks daily, some gym   Diet:    Social Determinants of Radio broadcast assistant Strain:   . Difficulty of Paying Living Expenses:   Food Insecurity:   . Worried About Charity fundraiser in the Last Year:   . Arboriculturist in the Last Year:   Transportation Needs:   . Film/video editor (Medical):   Marland Kitchen Lack of Transportation (Non-Medical):   Physical Activity:   . Days of Exercise per Week:   . Minutes of Exercise per Session:   Stress:   . Feeling of Stress :   Social Connections:   .  Frequency of Communication with Friends and Family:   . Frequency of Social Gatherings with Friends and Family:   . Attends Religious Services:   . Active Member of Clubs or Organizations:   . Attends Club or Organization Meetings:   . Marital Status:    Past Surgical History:  Procedure Laterality Date  . CHOLECYSTECTOMY  2006  . COLONOSCOPY WITH PROPOFOL N/A 02/01/2017   Procedure: COLONOSCOPY WITH PROPOFOL;  Surgeon: Toledo, Teodoro K, MD;  Location: [facility] END[facility]COPY;  Service: Gastroenterology;  Laterality: N/A;  . ESOPHAGOGASTRODUODENOSCOPY (EGD) WITH PROPOFOL N/A 02/01/2017   Procedure: ESOPHAGOGASTRODUODENOSCOPY (EGD) WITH PROPOFOL;  Surgeon: Toledo, Teodoro K, MD;  Location: [facility] END[facility]COPY;   Service: Gastroenterology;  Laterality: N/A;  . HERNIA REPAIR  08/2010  . PROSTATECTOMY  2003   Past Medical History:  Diagnosis Date  . Dyslipidemia   . H/O prostate cancer 2003   s/p radical prostatectomy (uro Dr. Blackmon in [place] yearly)  . Headache 2012   thorough w/u - unrevealing.  has seen 3 neurologists, MRI, LP nonacute.  ESR = 3  . HTN (hypertension)   . Hypogonadism male   . Low testosterone   . Migraines   . OSA on CPAP   . 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 Vitamin D deficiency    BP 139/80   Pulse (!) 59   Temp (!) 97.5 F (36.4 C)   Ht 6' 1" (1.854 m)   Wt 200 lb (90.7 kg)   SpO2 98%   BMI 26.39 kg/m   Opioid Risk Score:   Fall Risk Score:  `1  Depression screen PHQ 2/9  Depression screen [facility] 2/9 04/04/2019 10/18/2018 08/13/2018 07/13/2018 06/20/2018 05/09/2018 04/05/2018  Decreased Interest 0 0 0 0 0 0 0  Down, Depressed, Hopeless 0 0 0 0 0 0 0  PHQ - 2 Score 0 0 0 0 0 0 0     Review of Systems  Constitutional: Negative.   HENT: Negative.   Eyes: Negative.   Respiratory: Negative.   Cardiovascular: Negative.   Gastrointestinal: Negative.   Endocrine: Negative.   Genitourinary: Negative.   Musculoskeletal: Positive for arthralgias, back pain and neck pain.  Skin: Negative.   Allergic/Immunologic: Negative.   Neurological:       Allodynia  Hematological: Negative.   Psychiatric/Behavioral: Negative.       Objective:   Physical Exam  Constitutional: No distress .  Respiratory: Normal effort.   Psych: Normal mood.  Normal behavior. Neuro: Alert.    Assessment & Plan:  Male with pmh of Vit D deficiency, OSA, migraines, low testosterone, HTN, presents with burning in face and shoulders.    1. 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 MRI brain relatively unremarkable  NCS/EMG showing right C8 radiculopathy and left  ulnar sensory mononeuropathy, however, this does not account for all of patient's presenting complaints.   Lidoderm patch/ointment, Zyrtec, Tomapax, Almond milk, Elavil without benefit  Unable to trial carbamazepine as patient is on Eliquis  Steroids made symptoms more severe  Side effects with Cymbalta  Will consider PT for neck ROM and cervical traction and TENS  Will consider referral to Psychology - CBT as rest and no activity seem to exacerbate the pain    Patient to inquire about Accupuncture if necessary after trial hypnotherapy  Will consider Vascular/Rheum referral for Raynaud like symptoms  ?Horner's like   syndrome (delayed pupil relfex, history of ptosis s/p surgery)  Will consider Sympathetic ganglion block/ESIs  Patient states main 2 goals are travel and play with grandchildren  Continue 100, 60, 100 mg Lyrica, encouraged with food  Good benefit with capsaicin cream on shoulders, however, not using at present because he wants to trial hypnotherapy  Encouraged Mediterranean diet, believes his wife is following  Cont Mag 2100m daily  Will consider trigger point injections  Will consider Lidocaine infusion  Will consider Gabapentin at higher doses  Will consider Botox injection  Will consider nitropaste   Will consider oxcarbazepine 1589mBID, with titration up to 900 BID if necessary after evaluation by hypnotherapy   Cont follow up with hypnotherapy  Will consider referral to ID/toxicology given association with GI illness, will discuss with ID  2. Sleep disturbance  See #1  Improving overall  3. Vitamin B12 deficiency  Level 558 on 2/3, continue supplement   4. Vitamin D  Level 29 on 2/3  Cont supplement to 4000 units daily

## 2019-08-20 NOTE — Addendum Note (Signed)
Addended by: Maryla Morrow A on: 08/20/2019 09:41 AM   Modules accepted: Orders

## 2019-09-19 ENCOUNTER — Encounter: Payer: Self-pay | Admitting: Physical Medicine & Rehabilitation

## 2019-09-19 ENCOUNTER — Other Ambulatory Visit: Payer: Self-pay

## 2019-09-19 ENCOUNTER — Encounter: Payer: Medicare PPO | Attending: Physical Medicine & Rehabilitation | Admitting: Physical Medicine & Rehabilitation

## 2019-09-19 ENCOUNTER — Other Ambulatory Visit: Payer: Self-pay | Admitting: Physical Medicine & Rehabilitation

## 2019-09-19 VITALS — BP 168/84 | HR 57 | Temp 97.6°F | Ht 73.0 in | Wt 196.4 lb

## 2019-09-19 DIAGNOSIS — G43909 Migraine, unspecified, not intractable, without status migrainosus: Secondary | ICD-10-CM | POA: Diagnosis not present

## 2019-09-19 DIAGNOSIS — E559 Vitamin D deficiency, unspecified: Secondary | ICD-10-CM | POA: Insufficient documentation

## 2019-09-19 DIAGNOSIS — E785 Hyperlipidemia, unspecified: Secondary | ICD-10-CM | POA: Insufficient documentation

## 2019-09-19 DIAGNOSIS — M792 Neuralgia and neuritis, unspecified: Secondary | ICD-10-CM | POA: Diagnosis present

## 2019-09-19 DIAGNOSIS — G501 Atypical facial pain: Secondary | ICD-10-CM

## 2019-09-19 DIAGNOSIS — I10 Essential (primary) hypertension: Secondary | ICD-10-CM | POA: Insufficient documentation

## 2019-09-19 DIAGNOSIS — G479 Sleep disorder, unspecified: Secondary | ICD-10-CM

## 2019-09-19 DIAGNOSIS — E538 Deficiency of other specified B group vitamins: Secondary | ICD-10-CM | POA: Diagnosis not present

## 2019-09-19 DIAGNOSIS — M4802 Spinal stenosis, cervical region: Secondary | ICD-10-CM | POA: Diagnosis not present

## 2019-09-19 DIAGNOSIS — G4733 Obstructive sleep apnea (adult) (pediatric): Secondary | ICD-10-CM | POA: Diagnosis not present

## 2019-09-19 DIAGNOSIS — R208 Other disturbances of skin sensation: Secondary | ICD-10-CM | POA: Diagnosis present

## 2019-09-19 DIAGNOSIS — G500 Trigeminal neuralgia: Secondary | ICD-10-CM | POA: Diagnosis present

## 2019-09-19 DIAGNOSIS — G8929 Other chronic pain: Secondary | ICD-10-CM | POA: Diagnosis not present

## 2019-09-19 DIAGNOSIS — Z8546 Personal history of malignant neoplasm of prostate: Secondary | ICD-10-CM | POA: Insufficient documentation

## 2019-09-19 MED ORDER — OXCARBAZEPINE 150 MG PO TABS: 150.0000 mg | ORAL_TABLET | Freq: Two times a day (BID) | ORAL | 1 refills | Status: DC

## 2019-09-19 NOTE — Progress Notes (Signed)
Subjective:    Patient ID: Evan Robinson, male    DOB: 04/14/1946, 74 y.o.   MRN: [id number]  HPI  Male with pmh of Vit D deficiency, OSA, migraines, low testosterone, HTN, presents with burning in face and shoulders.   Initially stated: Started ~12/2016. He went to IM, Endo, Derm with negative workup. He had lab work for flushing and workup was negative. He saw Neurology, started on Cymbalta and Topomax without benefit. He had brain/C-spine MRI, which showed cervical stenosis. He saw Neurology and was told not radiculopathy related. Progressively getting worse.  Denies alleviating factors. Associated cold hands. Stationary positions exacerbate the pain.  Burning type pain. Radiates to all extremities at times. Denies associated weakness. Denies falls. Wants to travel and grandchildren. Pheochromocytoma workup negative. States he has tried everything. Carotid ultrasound negative per patient.   Last clinic visit on 08/20/19.  Since that time, he states he has been doing hypnosis. He continues to take Lyrica. Discussed with ID regarding possible possible toxic source, but difficult to assess.   Pain Inventory Average Pain 7 Pain Right Now 7 My pain is burning  In the last 24 hours, has pain interfered with the following? General activity 2 Relation with others 2 Enjoyment of life 2  What TIME of day is your pain at its worst? evening, night Sleep (in general) Fair  Pain is worse with: sitting and inactivity Pain improves with: rest and medication Relief from Meds: 5  Mobility walk without assistance Do you have any goals in this area?  no  Function retired Do you have any goals in this area?  no  Neuro/Psych No problems in this area  Prior Studies Any changes since last visit?  no  Physicians involved in your care Any changes since last visit?  no   Family History  Problem Relation Age of Onset  . Alcohol abuse Father   . Cancer Father        prostate  . CAD Father         possible MI  . Alcohol abuse Paternal Grandfather   . Stroke Mother   . Diabetes Neg Hx    Social History   Socioeconomic History  . Marital status: Married    Spouse name: Not on file  . Number of children: Not on file  . Years of education: Not on file  . Highest education level: Not on file  Occupational History  . Not on file  Tobacco Use  . Smoking status: Never Smoker  . Smokeless tobacco: Never Used  Substance and Sexual Activity  . Alcohol use: No  . Drug use: No  . Sexual activity: Not on file  Other Topics Concern  . Not on file  Social History Narrative   "Evan Robinson"   Lives with wife   Occupation: retired, was Pharmacist, hospital   Activity: walks daily, some gym   Diet:    Social Determinants of Radio broadcast assistant Strain:   . Difficulty of Paying Living Expenses:   Food Insecurity:   . Worried About Charity fundraiser in the Last Year:   . Arboriculturist in the Last Year:   Transportation Needs:   . Film/video editor (Medical):   Marland Kitchen Lack of Transportation (Non-Medical):   Physical Activity:   . Days of Exercise per Week:   . Minutes of Exercise per Session:   Stress:   . Feeling of Stress :   Social Connections:   .  Frequency of Communication with Friends and Family:   . Frequency of Social Gatherings with Friends and Family:   . Attends Religious Services:   . Active Member of Clubs or Organizations:   . Attends Archivist Meetings:   Marland Kitchen Marital Status:    Past Surgical History:  Procedure Laterality Date  . CHOLECYSTECTOMY  2006  . COLONOSCOPY WITH PROPOFOL N/A 02/01/2017   Procedure: COLONOSCOPY WITH PROPOFOL;  Surgeon: Toledo, Benay Pike, MD;  Location: [facility];  Service: Gastroenterology;  Laterality: N/A;  . ESOPHAGOGASTRODUODENOSCOPY (EGD) WITH PROPOFOL N/A 02/01/2017   Procedure: ESOPHAGOGASTRODUODENOSCOPY (EGD) WITH PROPOFOL;  Surgeon: Toledo, Benay Pike, MD;  Location: [facility];  Service: Gastroenterology;   Laterality: N/A;  . HERNIA REPAIR  08/2010  . PROSTATECTOMY  2003   Past Medical History:  Diagnosis Date  . Dyslipidemia   . H/O prostate cancer 2003   s/p radical prostatectomy (uro Dr. Rush Robinson in [place] yearly)  . Headache 2012   thorough w/u - unrevealing.  has seen 3 neurologists, MRI, LP nonacute.  ESR = 3  . HTN (hypertension)   . Hypogonadism male   . Low testosterone   . Migraines   . OSA on CPAP   . 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 Vitamin D deficiency    BP (!) 168/84   Pulse (!) 57   Temp 97.6 F (36.4 C)   Ht _0  (1.854 m)   Wt 196 lb 6.4 oz (89.1 kg)   SpO2 98%   BMI 25.91 kg/m   Opioid Risk Score:   Fall Risk Score:  `1  Depression screen PHQ 2/9  Depression screen [facility] 2/9 04/04/2019 10/18/2018 08/13/2018 07/13/2018 06/20/2018 05/09/2018 04/05/2018  Decreased Interest 0 0 0 0 0 0 0  Down, Depressed, Hopeless 0 0 0 0 0 0 0  PHQ - 2 Score 0 0 0 0 0 0 0     Review of Systems  Constitutional: Negative.   HENT: Negative.   Eyes: Negative.   Respiratory: Negative.   Cardiovascular: Negative.   Gastrointestinal: Negative.   Endocrine: Negative.   Genitourinary: Negative.   Musculoskeletal: Positive for arthralgias, back pain and neck pain.  Skin: Negative.   Allergic/Immunologic: Negative.   Neurological:       Allodynia  Hematological: Negative.   Psychiatric/Behavioral: Negative.       Objective:   Physical Exam  Constitutional: No distress .  Respiratory: Normal effort.   Psych: Normal mood.  Normal behavior. Neuro: Alert.    Assessment & Plan:  Male with pmh of Vit D deficiency, OSA, migraines, low testosterone, HTN, presents with burning in face and shoulders.    1. 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   Lidoderm patch/ointment, Zyrtec, Tomapax, Almond milk, Elavil without benefit  Unable to trial carbamazepine as patient is on Eliquis  Steroids made symptoms more severe  Side effects with Cymbalta  Will consider PT for neck ROM and cervical traction and TENS  Will consider referral to Psychology - CBT as rest and no activity seem to exacerbate the pain    Patient to inquire about Accupuncture if necessary after trial hypnotherapy  Will consider Vascular/Rheum referral for Raynaud like symptoms  ?  Horner's like syndrome (delayed pupil relfex, history of ptosis s/p surgery)  Will consider Sympathetic ganglion block/ESIs  Patient states main 2 goals are travel and play with grandchildren, which he is able to do more  Continue 100, 50, 100 mg Lyrica, encouraged with food  Good benefit with capsaicin cream on shoulders, however, not using at present because he wants to trial hypnotherapy  Encouraged Mediterranean diet, believes his wife is following  Cont Mag 233m daily  Will consider trigger point injections  Will consider Lidocaine infusion  Will consider Gabapentin at higher doses  Will consider Botox injection  Will consider nitropaste   Will order oxcarbazepine 1551mBID, with titration up Up to 900 BID  Cont follow up with hypotherapy  Discussed with ID - small likelihood of benefit with referral [facility]   2. Sleep disturbance  See #1  Improving overall  3. Vitamin B12 deficiency  Level 558 on 2/3, continue supplement  Will order repeat labs   4. Vitamin D  Level 29 on 2/3  Cont supplement to 4000 units daily  Will order repeat labs

## 2019-10-15 LAB — VITAMIN D 25 HYDROXY (VIT D DEFICIENCY, FRACTURES): Vit D, 25-Hydroxy: 34.4 ng/mL (ref 30.0–100.0)

## 2019-10-15 LAB — VITAMIN B12: Vitamin B-12: 419 pg/mL (ref 232–1245)

## 2019-10-17 ENCOUNTER — Other Ambulatory Visit: Payer: Self-pay

## 2019-10-17 ENCOUNTER — Encounter: Payer: Self-pay | Admitting: Physical Medicine & Rehabilitation

## 2019-10-17 ENCOUNTER — Encounter: Payer: Medicare PPO | Attending: Physical Medicine & Rehabilitation | Admitting: Physical Medicine & Rehabilitation

## 2019-10-17 VITALS — BP 145/75 | HR 53 | Temp 97.5°F | Ht 72.0 in | Wt 192.0 lb

## 2019-10-17 DIAGNOSIS — G501 Atypical facial pain: Secondary | ICD-10-CM

## 2019-10-17 DIAGNOSIS — M4802 Spinal stenosis, cervical region: Secondary | ICD-10-CM | POA: Insufficient documentation

## 2019-10-17 DIAGNOSIS — Z8546 Personal history of malignant neoplasm of prostate: Secondary | ICD-10-CM | POA: Diagnosis not present

## 2019-10-17 DIAGNOSIS — E559 Vitamin D deficiency, unspecified: Secondary | ICD-10-CM | POA: Diagnosis present

## 2019-10-17 DIAGNOSIS — E538 Deficiency of other specified B group vitamins: Secondary | ICD-10-CM | POA: Diagnosis not present

## 2019-10-17 DIAGNOSIS — G8929 Other chronic pain: Secondary | ICD-10-CM | POA: Insufficient documentation

## 2019-10-17 DIAGNOSIS — G4733 Obstructive sleep apnea (adult) (pediatric): Secondary | ICD-10-CM | POA: Diagnosis not present

## 2019-10-17 DIAGNOSIS — G43909 Migraine, unspecified, not intractable, without status migrainosus: Secondary | ICD-10-CM | POA: Insufficient documentation

## 2019-10-17 DIAGNOSIS — I10 Essential (primary) hypertension: Secondary | ICD-10-CM | POA: Diagnosis not present

## 2019-10-17 DIAGNOSIS — R208 Other disturbances of skin sensation: Secondary | ICD-10-CM | POA: Insufficient documentation

## 2019-10-17 DIAGNOSIS — G44229 Chronic tension-type headache, not intractable: Secondary | ICD-10-CM

## 2019-10-17 DIAGNOSIS — M792 Neuralgia and neuritis, unspecified: Secondary | ICD-10-CM | POA: Diagnosis present

## 2019-10-17 DIAGNOSIS — E785 Hyperlipidemia, unspecified: Secondary | ICD-10-CM | POA: Diagnosis not present

## 2019-10-17 DIAGNOSIS — G500 Trigeminal neuralgia: Secondary | ICD-10-CM | POA: Diagnosis present

## 2019-10-17 DIAGNOSIS — G479 Sleep disorder, unspecified: Secondary | ICD-10-CM | POA: Diagnosis not present

## 2019-10-17 MED ORDER — TOPIRAMATE 25 MG PO TABS: 25.0000 mg | ORAL_TABLET | Freq: Every day | ORAL | 1 refills | Status: DC

## 2019-10-17 NOTE — Progress Notes (Deleted)
Subjective:    Patient ID: Evan Robinson, male    DOB: February 01, 1946, 74 y.o.   MRN: [id number]  HPI   Pain Inventory Average Pain 7 Pain Right Now 8 My pain is burning  In the last 24 hours, has pain interfered with the following? General activity 4 Relation with others 4 Enjoyment of life 5 What TIME of day is your pain at its worst? evening, night Sleep (in general) Fair  Pain is worse with: sitting and inactivity Pain improves with: medication Relief from Meds: 5  Mobility walk without assistance Do you have any goals in this area?  no  Function Do you have any goals in this area?  no  Neuro/Psych No problems in this area  Prior Studies Any changes since last visit?  no  Physicians involved in your care Any changes since last visit?  no   Family History  Problem Relation Age of Onset  . Alcohol abuse Father   . Cancer Father        prostate  . CAD Father        possible MI  . Alcohol abuse Paternal Grandfather   . Stroke Mother   . Diabetes Neg Hx    Social History   Socioeconomic History  . Marital status: Married    Spouse name: Not on file  . Number of children: Not on file  . Years of education: Not on file  . Highest education level: Not on file  Occupational History  . Not on file  Tobacco Use  . Smoking status: Never Smoker  . Smokeless tobacco: Never Used  Vaping Use  . Vaping Use: Never used  Substance and Sexual Activity  . Alcohol use: No  . Drug use: No  . Sexual activity: Not on file  Other Topics Concern  . Not on file  Social History Narrative   "Gene"   Lives with wife   Occupation: retired, was Pharmacist, hospital   Activity: walks daily, some gym   Diet:    Social Determinants of Radio broadcast assistant Strain:   . Difficulty of Paying Living Expenses:   Food Insecurity:   . Worried About Charity fundraiser in the Last Year:   . Arboriculturist in the Last Year:   Transportation Needs:   . Lexicographer (Medical):   Marland Kitchen Lack of Transportation (Non-Medical):   Physical Activity:   . Days of Exercise per Week:   . Minutes of Exercise per Session:   Stress:   . Feeling of Stress :   Social Connections:   . Frequency of Communication with Friends and Family:   . Frequency of Social Gatherings with Friends and Family:   . Attends Religious Services:   . Active Member of Clubs or Organizations:   . Attends Archivist Meetings:   Marland Kitchen Marital Status:    Past Surgical History:  Procedure Laterality Date  . CHOLECYSTECTOMY  2006  . COLONOSCOPY WITH PROPOFOL N/A 02/01/2017   Procedure: COLONOSCOPY WITH PROPOFOL;  Surgeon: Toledo, Benay Pike, MD;  Location: [facility];  Service: Gastroenterology;  Laterality: N/A;  . ESOPHAGOGASTRODUODENOSCOPY (EGD) WITH PROPOFOL N/A 02/01/2017   Procedure: ESOPHAGOGASTRODUODENOSCOPY (EGD) WITH PROPOFOL;  Surgeon: Toledo, Benay Pike, MD;  Location: [facility];  Service: Gastroenterology;  Laterality: N/A;  . HERNIA REPAIR  08/2010  . PROSTATECTOMY  2003   Past Medical History:  Diagnosis Date  . Dyslipidemia   . H/O prostate  cancer 2003   s/p radical prostatectomy (uro Dr. Rush Farmer in [place] yearly)  . Headache 2012   thorough w/u - unrevealing.  has seen 3 neurologists, MRI, LP nonacute.  ESR = 3  . HTN (hypertension)   . Hypogonadism male   . Low testosterone   . Migraines   . OSA on CPAP   . 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 Vitamin D deficiency    BP (!) 145/75   Pulse (!) 53   Temp (!) 97.5 F (36.4 C)   Wt 192 lb (87.1 kg)   SpO2 98%   BMI 25.33 kg/m   Opioid Risk Score:   Fall Risk Score:  `1  Depression screen PHQ 2/9  Depression screen [facility] 2/9 04/04/2019 10/18/2018 08/13/2018 07/13/2018 06/20/2018 05/09/2018 04/05/2018  Decreased Interest 0 0 0 0 0 0 0  Down, Depressed, Hopeless 0 0 0 0 0 0 0  PHQ - 2 Score 0 0 0 0 0 0 0    Review of Systems  Constitutional:  Negative.   HENT: Negative.   Eyes: Negative.   Respiratory: Negative.   Cardiovascular: Negative.   Gastrointestinal: Negative.   Endocrine: Negative.   Genitourinary: Negative.   Musculoskeletal: Positive for neck pain and neck stiffness.  Skin: Negative.   Allergic/Immunologic: Negative.   Neurological: Negative.   Hematological: Negative.   Psychiatric/Behavioral: Negative.   All other systems reviewed and are negative.      Objective:   Physical Exam        Assessment & Plan:

## 2019-10-17 NOTE — Progress Notes (Signed)
° °Subjective:  ° ° Patient ID: Evan Robinson, male    DOB: 07/06/1945, 74 y.o.   MRN: [id number] ° °HPI  °Male with pmh of Vit D deficiency, OSA, migraines, low testosterone, HTN, presents with burning in face and shoulders.   °Initially stated: Started ~12/2016. He went to IM, Endo, Derm with negative workup. He had lab work for flushing and workup was negative. He saw Neurology, started on Cymbalta and Topomax without benefit. He had brain/C-spine MRI, which showed cervical stenosis. He saw Neurology and was told not radiculopathy related. Progressively getting worse.  Denies alleviating factors. Associated cold hands. Stationary positions exacerbate the pain.  Burning type pain. Radiates to all extremities at times. Denies associated weakness. Denies falls. Wants to travel and grandchildren. Pheochromocytoma workup negative. States he has tried everything. Carotid ultrasound negative per patient.  ° °Last clinic visit on 09/19/2019.  Since that time, patient states the burning is about the same.  He notes he had lethargy/dizziness with oxycarbazepine.  He has not followed up with Hypnotist.  He continues to have some benefit with Lyrica. He did get labs after last visit.  He was been busy moving into a new home. He notes new headaches several times a week.  ° °Pain Inventory °Average Pain 7 °Pain Right Now 8 °My pain is burning ° °In the last 24 hours, has pain interfered with the following? °General activity 2 °Relation with others 2 °Enjoyment of life 2  °What TIME of day is your pain at its worst? evening, night °Sleep (in general) Fair ° °Pain is worse with: sitting and inactivity °Pain improves with: rest and medication °Relief from Meds: 5 ° °Mobility °walk without assistance °Do you have any goals in this area?  no ° °Function °retired °Do you have any goals in this area?  no ° °Neuro/Psych °No problems in this area ° °Prior Studies °Any changes since last visit?  no ° °Physicians involved in your care °Any  changes since last visit?  no ° ° °Family History  °Problem Relation Age of Onset  °• Alcohol abuse Father   °• Cancer Father   °     prostate  °• CAD Father   °     possible MI  °• Alcohol abuse Paternal Grandfather   °• Stroke Mother   °• Diabetes Neg Hx   ° °Social History  ° °Socioeconomic History  °• Marital status: Married  °  Spouse name: Not on file  °• Number of children: Not on file  °• Years of education: Not on file  °• Highest education level: Not on file  °Occupational History  °• Not on file  °Tobacco Use  °• Smoking status: Never Smoker  °• Smokeless tobacco: Never Used  °Vaping Use  °• Vaping Use: Never used  °Substance and Sexual Activity  °• Alcohol use: No  °• Drug use: No  °• Sexual activity: Not on file  °Other Topics Concern  °• Not on file  °Social History Narrative  ° "Gene"  ° Lives with wife  ° Occupation: retired, was CPA  ° Edu:BS  ° Activity: walks daily, some gym  ° Diet:   ° °Social Determinants of Health  ° °Financial Resource Strain:   °• Difficulty of Paying Living Expenses:   °Food Insecurity:   °• Worried About Running Out of Food in the Last Year:   °• Ran Out of Food in the Last Year:   °Transportation Needs:   °• Lack of Transportation (  Medical):   °• Lack of Transportation (Non-Medical):   °Physical Activity:   °• Days of Exercise per Week:   °• Minutes of Exercise per Session:   °Stress:   °• Feeling of Stress :   °Social Connections:   °• Frequency of Communication with Friends and Family:   °• Frequency of Social Gatherings with Friends and Family:   °• Attends Religious Services:   °• Active Member of Clubs or Organizations:   °• Attends Club or Organization Meetings:   °• Marital Status:   ° °Past Surgical History:  °Procedure Laterality Date  °• CHOLECYSTECTOMY  2006  °• COLONOSCOPY WITH PROPOFOL N/A 02/01/2017  ° Procedure: COLONOSCOPY WITH PROPOFOL;  Surgeon: Toledo, Teodoro K, MD;  Location: [facility];  Service: Gastroenterology;  Laterality: N/A;  °•  ESOPHAGOGASTRODUODENOSCOPY (EGD) WITH PROPOFOL N/A 02/01/2017  ° Procedure: ESOPHAGOGASTRODUODENOSCOPY (EGD) WITH PROPOFOL;  Surgeon: Toledo, Teodoro K, MD;  Location: [facility];  Service: Gastroenterology;  Laterality: N/A;  °• HERNIA REPAIR  08/2010  °• PROSTATECTOMY  2003  ° °Past Medical History:  °Diagnosis Date  °• Dyslipidemia   °• H/O prostate cancer 2003  ° s/p radical prostatectomy (uro Dr. Blackmon in [place] yearly)  °• Headache 2012  ° thorough w/u - unrevealing.  has seen 3 neurologists, MRI, LP nonacute.  ESR = 3  °• HTN (hypertension)   °• Hypogonadism male   °• Low testosterone   °• Migraines   °• OSA on CPAP   °• Renal cyst 12/2010  ° complex cyst upper pole L kidney with no malignancy by MRI  °• Vitamin D deficiency   ° °BP (!) 145/75    Pulse (!) 53    Temp (!) 97.5 °F (36.4 °C)    Wt 192 lb (87.1 kg)    SpO2 98%    BMI 25.33 kg/m²  ° °Opioid Risk Score:   °Fall Risk Score:  `1 ° °Depression screen PHQ 2/9 ° °Depression screen PHQ 2/9 04/04/2019 10/18/2018 08/13/2018 07/13/2018 06/20/2018 05/09/2018 04/05/2018  °Decreased Interest 0 0 0 0 0 0 0  °Down, Depressed, Hopeless 0 0 0 0 0 0 0  °PHQ - 2 Score 0 0 0 0 0 0 0  ° ° ° °Review of Systems  °Constitutional: Negative.   °HENT: Negative.   °Eyes: Negative.   °Respiratory: Negative.   °Cardiovascular: Negative.   °Gastrointestinal: Negative.   °Endocrine: Negative.   °Genitourinary: Negative.   °Musculoskeletal: Positive for arthralgias, back pain and neck pain.  °Skin: Negative.   °Allergic/Immunologic: Negative.   °Neurological:  ° Allodynia  °Hematological: Negative.   °Psychiatric/Behavioral: Negative.   ° °   °Objective:  ° Physical Exam  °Constitutional: No distress .  °Respiratory: Normal effort.   °Psych: Normal mood.  Normal behavior. °Neuro: Alert. °   °Assessment & Plan:  °Male with pmh of Vit D deficiency, OSA, migraines, low testosterone, HTN, presents with burning in face and shoulders.   °  °1. 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 MRI brain relatively unremarkable °            NCS/EMG showing right C8 radiculopathy and left ulnar sensory mononeuropathy, however, this does not account for all of patient's presenting complaints.  °            Lidoderm patch/ointment, Zyrtec, Tomapax, Almond milk, Elavil without benefit °              Unable to trial carbamazepine as patient is on Eliquis             Steroids made symptoms more severe             Side effects with Cymbalta, oxcarbazepine             Will consider PT for neck ROM and cervical traction and TENS             Will consider referral to Psychology - CBT as rest and no activity seem to exacerbate the pain               Patient to inquire about Accupuncture if necessary after trial hypnotherapy             Will consider Vascular/Rheum referral for Raynaud like symptoms             ?Horner's like syndrome (delayed pupil relfex, history of ptosis s/p surgery)             Will consider Sympathetic ganglion block/ESIs             Patient states main 2 goals are travel and play with grandchildren, which he is able to do more             Continue 100, 50, 100 mg Lyrica, encouraged with food             Good benefit with capsaicin cream on shoulders, however, not using at present because he wants to trial hypnotherapy             Encouraged Mediterranean diet, believes his wife is following             Cont Mag 222m daily             Will consider trigger point injections             Will consider Lidocaine infusion             Will consider Gabapentin at higher doses             Will consider Botox injection             Will consider nitropaste             Continue follow up with hypotherapy, has not follow up due to house move             Discussed with ID - small likelihood of benefit with referral to [facility] at [facility]  2. Sleep disturbance             See #1             Fait  3. Vitamin B12 deficiency             Level 419 on 6/14, increase supplement              4. Vitamin D             Level 34.4 on 6/14             Increase supplement to 8000 units daily  5. Acute on chronic Headaches  Trial Topamax 25 qhs

## 2019-10-22 ENCOUNTER — Other Ambulatory Visit: Payer: Self-pay | Admitting: *Deleted

## 2019-10-22 MED ORDER — TOPIRAMATE 25 MG PO TABS: 25.0000 mg | ORAL_TABLET | Freq: Every day | ORAL | 0 refills | Status: DC

## 2019-11-14 ENCOUNTER — Encounter: Payer: Self-pay | Admitting: Physical Medicine & Rehabilitation

## 2019-11-14 ENCOUNTER — Encounter: Payer: Medicare PPO | Attending: Physical Medicine & Rehabilitation | Admitting: Physical Medicine & Rehabilitation

## 2019-11-14 ENCOUNTER — Other Ambulatory Visit: Payer: Self-pay

## 2019-11-14 VITALS — BP 127/73 | HR 51 | Temp 97.8°F | Ht 72.0 in | Wt 190.0 lb

## 2019-11-14 DIAGNOSIS — E538 Deficiency of other specified B group vitamins: Secondary | ICD-10-CM | POA: Diagnosis present

## 2019-11-14 DIAGNOSIS — M4802 Spinal stenosis, cervical region: Secondary | ICD-10-CM | POA: Diagnosis not present

## 2019-11-14 DIAGNOSIS — Z8546 Personal history of malignant neoplasm of prostate: Secondary | ICD-10-CM | POA: Insufficient documentation

## 2019-11-14 DIAGNOSIS — E559 Vitamin D deficiency, unspecified: Secondary | ICD-10-CM | POA: Diagnosis present

## 2019-11-14 DIAGNOSIS — G8929 Other chronic pain: Secondary | ICD-10-CM | POA: Insufficient documentation

## 2019-11-14 DIAGNOSIS — G43909 Migraine, unspecified, not intractable, without status migrainosus: Secondary | ICD-10-CM | POA: Diagnosis not present

## 2019-11-14 DIAGNOSIS — I10 Essential (primary) hypertension: Secondary | ICD-10-CM | POA: Insufficient documentation

## 2019-11-14 DIAGNOSIS — G500 Trigeminal neuralgia: Secondary | ICD-10-CM | POA: Insufficient documentation

## 2019-11-14 DIAGNOSIS — R208 Other disturbances of skin sensation: Secondary | ICD-10-CM | POA: Insufficient documentation

## 2019-11-14 DIAGNOSIS — G4733 Obstructive sleep apnea (adult) (pediatric): Secondary | ICD-10-CM | POA: Insufficient documentation

## 2019-11-14 DIAGNOSIS — E785 Hyperlipidemia, unspecified: Secondary | ICD-10-CM | POA: Insufficient documentation

## 2019-11-14 DIAGNOSIS — G44229 Chronic tension-type headache, not intractable: Secondary | ICD-10-CM | POA: Insufficient documentation

## 2019-11-14 DIAGNOSIS — G501 Atypical facial pain: Secondary | ICD-10-CM

## 2019-11-14 DIAGNOSIS — M792 Neuralgia and neuritis, unspecified: Secondary | ICD-10-CM | POA: Diagnosis present

## 2019-11-14 MED ORDER — PREGABALIN 100 MG PO CAPS: 100.0000 mg | ORAL_CAPSULE | Freq: Three times a day (TID) | ORAL | 1 refills | Status: DC

## 2019-11-14 NOTE — Progress Notes (Signed)
Subjective:    Patient ID: Evan Robinson, male    DOB: 25-Nov-1945, 74 y.o.   MRN: [id number]  HPI  Male with pmh of Vit D deficiency, OSA, migraines, low testosterone, HTN, presents with burning in face and shoulders.   Initially stated: Started ~12/2016. He went to IM, Endo, Derm with negative workup. He had lab work for flushing and workup was negative. He saw Neurology, started on Cymbalta and Topomax without benefit. He had brain/C-spine MRI, which showed cervical stenosis. He saw Neurology and was told not radiculopathy related. Progressively getting worse.  Denies alleviating factors. Associated cold hands. Stationary positions exacerbate the pain.  Burning type pain. Radiates to all extremities at times. Denies associated weakness. Denies falls. Wants to travel and grandchildren. Pheochromocytoma workup negative. States he has tried everything. Carotid ultrasound negative per patient.   Last clinic visit on 10/17/19.  Since that pt states he has not gone back to the hypnotist.  He continues to take Lyrica. He states Topamax made his burning, no difference in headaches.   Pain Inventory Average Pain 7 Pain Right Now 7 My pain is burning  In the last 24 hours, has pain interfered with the following? General activity 6 Relation with others 7 Enjoyment of life 7  What TIME of day is your pain at its worst? evening, night Sleep (in general) Fair  Pain is worse with: sitting and inactivity Pain improves with: rest and medication Relief from Meds: 2  Mobility walk without assistance Do you have any goals in this area?  no  Function retired Do you have any goals in this area?  no  Neuro/Psych No problems in this area  Prior Studies Any changes since last visit?  no  Physicians involved in your care Any changes since last visit?  no   Family History  Problem Relation Age of Onset  . Alcohol abuse Father   . Cancer Father        prostate  . CAD Father        possible MI   . Alcohol abuse Paternal Grandfather   . Stroke Mother   . Diabetes Neg Hx    Social History   Socioeconomic History  . Marital status: Married    Spouse name: Not on file  . Number of children: Not on file  . Years of education: Not on file  . Highest education level: Not on file  Occupational History  . Not on file  Tobacco Use  . Smoking status: Never Smoker  . Smokeless tobacco: Never Used  Vaping Use  . Vaping Use: Never used  Substance and Sexual Activity  . Alcohol use: No  . Drug use: No  . Sexual activity: Not on file  Other Topics Concern  . Not on file  Social History Narrative   "Evan Robinson"   Lives with wife   Occupation: retired, was Pharmacist, hospital   Activity: walks daily, some gym   Diet:    Social Determinants of Radio broadcast assistant Strain:   . Difficulty of Paying Living Expenses:   Food Insecurity:   . Worried About Charity fundraiser in the Last Year:   . Arboriculturist in the Last Year:   Transportation Needs:   . Film/video editor (Medical):   Marland Kitchen Lack of Transportation (Non-Medical):   Physical Activity:   . Days of Exercise per Week:   . Minutes of Exercise per Session:   Stress:   .  Feeling of Stress :   Social Connections:   . Frequency of Communication with Friends and Family:   . Frequency of Social Gatherings with Friends and Family:   . Attends Religious Services:   . Active Member of Clubs or Organizations:   . Attends Archivist Meetings:   Marland Kitchen Marital Status:    Past Surgical History:  Procedure Laterality Date  . CHOLECYSTECTOMY  2006  . COLONOSCOPY WITH PROPOFOL N/A 02/01/2017   Procedure: COLONOSCOPY WITH PROPOFOL;  Surgeon: Toledo, Benay Pike, MD;  Location: [facility] END[facility]COPY;  Service: Gastroenterology;  Laterality: N/A;  . ESOPHAGOGASTRODUODENOSCOPY (EGD) WITH PROPOFOL N/A 02/01/2017   Procedure: ESOPHAGOGASTRODUODENOSCOPY (EGD) WITH PROPOFOL;  Surgeon: Toledo, Benay Pike, MD;  Location: [facility] ENDOSCOPY;   Service: Gastroenterology;  Laterality: N/A;  . HERNIA REPAIR  08/2010  . PROSTATECTOMY  2003   Past Medical History:  Diagnosis Date  . Dyslipidemia   . H/O prostate cancer 2003   s/p radical prostatectomy (uro Dr. Rush Farmer in [place] yearly)  . Headache 2012   thorough w/u - unrevealing.  has seen 3 neurologists, MRI, LP nonacute.  ESR = 3  . HTN (hypertension)   . Hypogonadism male   . Low testosterone   . Migraines   . OSA on CPAP   . 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 Vitamin D deficiency    BP 127/73   Pulse (!) 51   Temp 97.8 F (36.6 C)   Ht 6' (1.829 m)   Wt 190 lb (86.2 kg)   SpO2 98%   BMI 25.77 kg/m   Opioid Risk Score:   Fall Risk Score:  `1  Depression screen PHQ 2/9  Depression screen [facility] 2/9 04/04/2019 10/18/2018 08/13/2018 07/13/2018 06/20/2018 05/09/2018 04/05/2018  Decreased Interest 0 0 0 0 0 0 0  Down, Depressed, Hopeless 0 0 0 0 0 0 0  PHQ - 2 Score 0 0 0 0 0 0 0   Review of Systems  Constitutional: Negative.   HENT: Negative.   Eyes: Negative.   Respiratory: Negative.   Cardiovascular: Negative.   Gastrointestinal: Negative.   Endocrine: Negative.   Genitourinary: Negative.   Musculoskeletal: Positive for arthalgias, back pain and neck pain.  Skin: Negative.   Allergic/Immunologic: Negative.   Neurological:   Allodynia  Hematological: Negative.   Psychiatric/Behavioral: Negative.       Objective:   Physical Exam  Constitutional: No distress .  Respiratory: Normal effort.   Psych: Normal mood.  Normal behavior. Neuro: Alert.     Assessment & Plan:  Male with pmh of Vit D deficiency, OSA, migraines, low testosterone, HTN, presents with burning in face and shoulders.     1. 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 MRI brain relatively unremarkable             NCS/EMG showing right C8 radiculopathy  and left ulnar sensory mononeuropathy, however, this does not account for all of patient's presenting complaints.              Lidoderm patch/ointment, Zyrtec, Tomapax, Almond milk, Elavil without benefit             Unable to trial carbamazepine as patient is on Eliquis             Steroids made symptoms more severe  Side effects with Cymbalta, oxcarbazepine             Will consider PT for neck ROM and cervical traction and TENS             Will consider referral to Psychology - CBT as rest and no activity seem to exacerbate the pain               Patient to inquire about Accupuncture if necessary after trial hypnotherapy             Will consider Vascular/Rheum referral for Raynaud like symptoms             ?Horner's like syndrome (delayed pupil relfex, history of ptosis s/p surgery)             Will consider Sympathetic ganglion block/ESIs             Patient states main 2 goals are travel and play with grandchildren, which he is able to do more             Continue 100, 50, 100 mg Lyrica, encouraged with food             Good benefit with capsaicin cream on shoulders, however, not using at present because he wants to trial hypnotherapy             Encouraged Mediterranean diet, believes his wife is following             Cont Mag 200mg daily             Will consider trigger point injections             Will consider Lidocaine infusion             Will consider Gabapentin at higher doses             Will consider Botox injection             Will consider nitropaste             Continue follow up with hypotherapy, has not follow up due to house move initially, now due to vacation             Discussed with ID - small likelihood of benefit with referral to [facility] at [facility]  Oxcarbazepine 150 BID, Will consider increase to 600 mg/day , will max of 1800mg/day  Will consider Pamelor   2. Sleep disturbance             See #1            Fait   3. Vitamin B12 deficiency              Level 419 on 6/14, increased supplement              4. Vitamin D             Level 34.4 on 6/14             Continue supplement to 8000 units daily   5. Acute on chronic Headaches  No benefit with Topamax 25 qhs with exacerbation in burning  

## 2019-12-10 ENCOUNTER — Encounter: Payer: Medicare PPO | Attending: Physical Medicine & Rehabilitation | Admitting: Physical Medicine & Rehabilitation

## 2019-12-10 ENCOUNTER — Other Ambulatory Visit: Payer: Self-pay

## 2019-12-10 ENCOUNTER — Encounter: Payer: Self-pay | Admitting: Physical Medicine & Rehabilitation

## 2019-12-10 VITALS — BP 128/74 | HR 59 | Temp 98.0°F | Ht 72.0 in | Wt 191.2 lb

## 2019-12-10 DIAGNOSIS — Z8546 Personal history of malignant neoplasm of prostate: Secondary | ICD-10-CM | POA: Insufficient documentation

## 2019-12-10 DIAGNOSIS — G43909 Migraine, unspecified, not intractable, without status migrainosus: Secondary | ICD-10-CM | POA: Diagnosis not present

## 2019-12-10 DIAGNOSIS — G500 Trigeminal neuralgia: Secondary | ICD-10-CM | POA: Diagnosis present

## 2019-12-10 DIAGNOSIS — G8929 Other chronic pain: Secondary | ICD-10-CM | POA: Insufficient documentation

## 2019-12-10 DIAGNOSIS — E559 Vitamin D deficiency, unspecified: Secondary | ICD-10-CM | POA: Diagnosis present

## 2019-12-10 DIAGNOSIS — M792 Neuralgia and neuritis, unspecified: Secondary | ICD-10-CM | POA: Diagnosis present

## 2019-12-10 DIAGNOSIS — I10 Essential (primary) hypertension: Secondary | ICD-10-CM | POA: Diagnosis not present

## 2019-12-10 DIAGNOSIS — E538 Deficiency of other specified B group vitamins: Secondary | ICD-10-CM | POA: Diagnosis present

## 2019-12-10 DIAGNOSIS — E785 Hyperlipidemia, unspecified: Secondary | ICD-10-CM | POA: Insufficient documentation

## 2019-12-10 DIAGNOSIS — M4802 Spinal stenosis, cervical region: Secondary | ICD-10-CM | POA: Diagnosis not present

## 2019-12-10 DIAGNOSIS — G501 Atypical facial pain: Secondary | ICD-10-CM | POA: Diagnosis not present

## 2019-12-10 DIAGNOSIS — G4733 Obstructive sleep apnea (adult) (pediatric): Secondary | ICD-10-CM | POA: Diagnosis not present

## 2019-12-10 DIAGNOSIS — G479 Sleep disorder, unspecified: Secondary | ICD-10-CM

## 2019-12-10 DIAGNOSIS — R208 Other disturbances of skin sensation: Secondary | ICD-10-CM | POA: Diagnosis present

## 2019-12-10 MED ORDER — OXCARBAZEPINE 300 MG PO TABS: 300.0000 mg | ORAL_TABLET | Freq: Two times a day (BID) | ORAL | 1 refills | Status: DC

## 2019-12-10 MED ORDER — NORTRIPTYLINE HCL 10 MG PO CAPS
10.0000 mg | ORAL_CAPSULE | Freq: Every day | ORAL | 1 refills | Status: DC
Start: 2019-12-10 — End: 2020-03-02

## 2019-12-10 NOTE — Progress Notes (Signed)
Subjective:    Patient ID: Evan Robinson, male    DOB: 1945-10-22, 74 y.o.   MRN: [id number]  HPI  Male with pmh of Vit D deficiency, OSA, migraines, low testosterone, HTN, presents with burning in face and shoulders.   Initially stated: Started ~12/2016. He went to IM, Endo, Derm with negative workup. He had lab work for flushing and workup was negative. He saw Neurology, started on Cymbalta and Topomax without benefit. He had brain/C-spine MRI, which showed cervical stenosis. He saw Neurology and was told not radiculopathy related. Progressively getting worse.  Denies alleviating factors. Associated cold hands. Stationary positions exacerbate the pain.  Burning type pain. Radiates to all extremities at times. Denies associated weakness. Denies falls. Wants to travel and grandchildren. Pheochromocytoma workup negative. States he has tried everything. Carotid ultrasound negative per patient.   Last clinic visit on 11/14/19.  Since that time, pt states he gets cold easily.  No skin changes or changes in color. He continues to take Lyrica. He tolerated Oxcarbazepine, but did not notice benefit.  He has not had time to go to the hypnotist again. Sleep is good.   Pain Inventory Average Pain 7 Pain Right Now 7 My pain is constant and burning  In the last 24 hours, has pain interfered with the following? General activity 6 Relation with others 6 Enjoyment of life 6  What TIME of day is your pain at its worst? evening & night. , night Sleep (in general) Good  Pain is worse with: inactivity Pain improves with: rest and medication Relief from Meds: 4  Mobility walk without assistance ability to climb steps?  yes do you drive?  yes Do you have any goals in this area?  yes  Function retired Do you have any goals in this area?  yes  Neuro/Psych No problems in this area  Prior Studies Any changes since last visit?  no  Physicians involved in your care Any changes since last visit?   no   Family History  Problem Relation Age of Onset  . Alcohol abuse Father   . Cancer Father        prostate  . CAD Father        possible MI  . Alcohol abuse Paternal Grandfather   . Stroke Mother   . Diabetes Neg Hx    Social History   Socioeconomic History  . Marital status: Married    Spouse name: Not on file  . Number of children: Not on file  . Years of education: Not on file  . Highest education level: Not on file  Occupational History  . Not on file  Tobacco Use  . Smoking status: Never Smoker  . Smokeless tobacco: Never Used  Vaping Use  . Vaping Use: Never used  Substance and Sexual Activity  . Alcohol use: No  . Drug use: No  . Sexual activity: Not on file  Other Topics Concern  . Not on file  Social History Narrative   "Gene"   Lives with wife   Occupation: retired, was Pharmacist, hospital   Activity: walks daily, some gym   Diet:    Social Determinants of Radio broadcast assistant Strain:   . Difficulty of Paying Living Expenses:   Food Insecurity:   . Worried About Charity fundraiser in the Last Year:   . Arboriculturist in the Last Year:   Transportation Needs:   . Film/video editor (Medical):   Marland Kitchen  Lack of Transportation (Non-Medical):   Physical Activity:   . Days of Exercise per Week:   . Minutes of Exercise per Session:   Stress:   . Feeling of Stress :   Social Connections:   . Frequency of Communication with Friends and Family:   . Frequency of Social Gatherings with Friends and Family:   . Attends Religious Services:   . Active Member of Clubs or Organizations:   . Attends Archivist Meetings:   Marland Kitchen Marital Status:    Past Surgical History:  Procedure Laterality Date  . CHOLECYSTECTOMY  2006  . COLONOSCOPY WITH PROPOFOL N/A 02/01/2017   Procedure: COLONOSCOPY WITH PROPOFOL;  Surgeon: Toledo, Benay Pike, MD;  Location: [facility] ENDOSCOPY;  Service: Gastroenterology;  Laterality: N/A;  . ESOPHAGOGASTRODUODENOSCOPY (EGD) WITH  PROPOFOL N/A 02/01/2017   Procedure: ESOPHAGOGASTRODUODENOSCOPY (EGD) WITH PROPOFOL;  Surgeon: Toledo, Benay Pike, MD;  Location: [facility] ENDOSCOPY;  Service: Gastroenterology;  Laterality: N/A;  . HERNIA REPAIR  08/2010  . PROSTATECTOMY  2003   Past Medical History:  Diagnosis Date  . Dyslipidemia   . H/O prostate cancer 2003   s/p radical prostatectomy (uro Dr. Rush Farmer in [place] yearly)  . Headache 2012   thorough w/u - unrevealing.  has seen 3 neurologists, MRI, LP nonacute.  ESR = 3  . HTN (hypertension)   . Hypogonadism male   . Low testosterone   . Migraines   . OSA on CPAP   . 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 Vitamin D deficiency    BP 128/74   Pulse (!) 59   Temp 98 F (36.7 C)   Ht 6' (1.829 m)   Wt 191 lb 3.2 oz (86.7 kg)   SpO2 98%   BMI 25.93 kg/m   Opioid Risk Score:   Fall Risk Score:  `1  Depression screen PHQ 2/9  Depression screen [facility] 2/9 12/10/2019 04/04/2019 10/18/2018 08/13/2018 07/13/2018 06/20/2018 05/09/2018  Decreased Interest 0 0 0 0 0 0 0  Down, Depressed, Hopeless 0 0 0 0 0 0 0  PHQ - 2 Score 0 0 0 0 0 0 0   Review of Systems  Constitutional: Negative.   HENT: Negative.   Eyes: Negative.   Respiratory: Negative.   Cardiovascular: Negative.   Gastrointestinal: Negative.   Endocrine: Negative.   Genitourinary: Negative.   Musculoskeletal: Positive for arthalgias, back pain and neck pain.  Skin: Negative.   Allergic/Immunologic: Negative.   Neurological:   Allodynia  Hematological: Negative.   Psychiatric/Behavioral: Negative.       Objective:   Physical Exam  Constitutional: No distress .  Respiratory: Normal effort.   Psych: Normal mood.  Normal behavior. Neuro: Alert.     Assessment & Plan:  Male with pmh of Vit D deficiency, OSA, migraines, low testosterone, HTN, presents with burning in face and shoulders.     1. 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              Lidoderm patch/ointment, Zyrtec, Tomapax, Almond milk, Elavil without benefit  Unable to trial carbamazepine as patient is on Eliquis             Steroids made symptoms more severe             Side effects with Cymbalta, oxcarbazepine             Will consider PT for neck ROM and cervical traction and TENS             Will consider referral to Psychology - CBT as rest and no activity seem to exacerbate the pain               Patient to inquire about Accupuncture if necessary after trial hypnotherapy             Will consider Vascular/Rheum referral for Raynaud like symptoms             ?Horner's like syndrome (delayed pupil relfex, history of ptosis s/p surgery)             Will consider Sympathetic ganglion block/ESIs, however worse with oral steroids.             Patient states main 2 goals are travel and play with grandchildren, which he is able to do more             Continue 100, 50, 100 mg Lyrica, encouraged with food             Good benefit with capsaicin cream on shoulders, however, not using at present because he wants to trial hypnotherapy             Encouraged Mediterranean diet, believes his wife is following             Cont Mag 251m daily             Will consider trigger point injections             Will consider Lidocaine/Ketamine infusion             Will consider Gabapentin at higher doses             Will consider Botox injection             Will consider nitropaste             Continue follow up with hypotherapy, has not follow up due to house move initially, now due to vacation, now timing             Discussed with ID - small likelihood of benefit with referral to [facility] at academic  center  Oxcarbazepine 150 BID, increased to 300 BID, will max of 1801mday  Will trial Pamelor   2. Sleep disturbance  See #1            Improving   3. Vitamin B12 deficiency             Level 419 on 6/14, increased supplement              4. Vitamin D             Level 34.4 on 6/14             Continue supplement to 8000 units daily   5. Acute on chronic Headaches  No benefit with Topamax 25 qhs with exacerbation in burning

## 2020-01-09 ENCOUNTER — Other Ambulatory Visit: Payer: Self-pay | Admitting: Physical Medicine & Rehabilitation

## 2020-01-30 ENCOUNTER — Encounter: Payer: Self-pay | Admitting: Physical Medicine & Rehabilitation

## 2020-01-30 ENCOUNTER — Encounter: Payer: Medicare PPO | Attending: Physical Medicine & Rehabilitation | Admitting: Physical Medicine & Rehabilitation

## 2020-01-30 ENCOUNTER — Other Ambulatory Visit: Payer: Self-pay

## 2020-01-30 VITALS — BP 128/73 | HR 62 | Temp 97.7°F | Ht 72.0 in | Wt 189.0 lb

## 2020-01-30 DIAGNOSIS — R208 Other disturbances of skin sensation: Secondary | ICD-10-CM | POA: Diagnosis present

## 2020-01-30 DIAGNOSIS — G4733 Obstructive sleep apnea (adult) (pediatric): Secondary | ICD-10-CM | POA: Insufficient documentation

## 2020-01-30 DIAGNOSIS — G43909 Migraine, unspecified, not intractable, without status migrainosus: Secondary | ICD-10-CM | POA: Insufficient documentation

## 2020-01-30 DIAGNOSIS — G479 Sleep disorder, unspecified: Secondary | ICD-10-CM | POA: Diagnosis not present

## 2020-01-30 DIAGNOSIS — G8929 Other chronic pain: Secondary | ICD-10-CM | POA: Diagnosis not present

## 2020-01-30 DIAGNOSIS — G501 Atypical facial pain: Secondary | ICD-10-CM

## 2020-01-30 DIAGNOSIS — E559 Vitamin D deficiency, unspecified: Secondary | ICD-10-CM

## 2020-01-30 DIAGNOSIS — E785 Hyperlipidemia, unspecified: Secondary | ICD-10-CM | POA: Insufficient documentation

## 2020-01-30 DIAGNOSIS — Z8546 Personal history of malignant neoplasm of prostate: Secondary | ICD-10-CM | POA: Diagnosis not present

## 2020-01-30 DIAGNOSIS — E538 Deficiency of other specified B group vitamins: Secondary | ICD-10-CM | POA: Diagnosis present

## 2020-01-30 DIAGNOSIS — M4802 Spinal stenosis, cervical region: Secondary | ICD-10-CM | POA: Diagnosis not present

## 2020-01-30 DIAGNOSIS — G500 Trigeminal neuralgia: Secondary | ICD-10-CM | POA: Insufficient documentation

## 2020-01-30 DIAGNOSIS — I10 Essential (primary) hypertension: Secondary | ICD-10-CM | POA: Insufficient documentation

## 2020-01-30 DIAGNOSIS — M792 Neuralgia and neuritis, unspecified: Secondary | ICD-10-CM | POA: Diagnosis present

## 2020-01-30 NOTE — Progress Notes (Addendum)
Subjective:    Patient ID: Evan Robinson, male    DOB: [birth year], 74 y.o.   MRN: [id number]  HPI Male with pmh of Vit D deficiency, OSA, migraines, low testosterone, HTN, presents with burning in face and shoulders.   Initially stated: Started ~12/2016. He went to IM, Endo, Derm with negative workup. He had lab work for flushing and workup was negative. He saw Neurology, started on Cymbalta and Topomax without benefit. He had brain/C-spine MRI, which showed cervical stenosis. He saw Neurology and was told not radiculopathy related. Progressively getting worse.  Denies alleviating factors. Associated cold hands. Stationary positions exacerbate the pain.  Burning type pain. Radiates to all extremities at times. Denies associated weakness. Denies falls. Wants to travel and grandchildren. Pheochromocytoma workup negative. States he has tried everything. Carotid ultrasound negative per patient.   Last clinic visit on  12/10/2019.  Since that time, patient states he is taking Lyrica 100 TID. He is working with home program from hypotherapist. He states he felt lethargic with Oxcarbazepine 150 BID. He believes Pamelor exacerbated symptoms. Sleep is good. He is taking Vit D/B12 supplement.   Pain Inventory Average Pain 8 Pain Right Now 7 My pain is constant and burning  In the last 24 hours, has pain interfered with the following? General activity 6 Relation with others 6 Enjoyment of life 7 What TIME of day is your pain at its worst? evening and night Sleep (in general) Good  Pain is worse with: sitting and inactivity Pain improves with: medication Relief from Meds: 4  Family History  Problem Relation Age of Onset  . Alcohol abuse Father   . Cancer Father        prostate  . CAD Father        possible MI  . Alcohol abuse Paternal Grandfather   . Stroke Mother   . Diabetes Neg Hx    Social History   Socioeconomic History  . Marital status: Married    Spouse name: Not on file  .  Number of children: Not on file  . Years of education: Not on file  . Highest education level: Not on file  Occupational History  . Not on file  Tobacco Use  . Smoking status: Never Smoker  . Smokeless tobacco: Never Used  Vaping Use  . Vaping Use: Never used  Substance and Sexual Activity  . Alcohol use: No  . Drug use: No  . Sexual activity: Not on file  Other Topics Concern  . Not on file  Social History Narrative   "Gene"   Lives with wife   Occupation: retired, was Pharmacist, hospital   Activity: walks daily, some gym   Diet:    Social Determinants of Radio broadcast assistant Strain:   . Difficulty of Paying Living Expenses: Not on file  Food Insecurity:   . Worried About Charity fundraiser in the Last Year: Not on file  . Ran Out of Food in the Last Year: Not on file  Transportation Needs:   . Lack of Transportation (Medical): Not on file  . Lack of Transportation (Non-Medical): Not on file  Physical Activity:   . Days of Exercise per Week: Not on file  . Minutes of Exercise per Session: Not on file  Stress:   . Feeling of Stress : Not on file  Social Connections:   . Frequency of Communication with Friends and Family: Not on file  . Frequency of Social Gatherings with  Friends and Family: Not on file  . Attends Religious Services: Not on file  . Active Member of Clubs or Organizations: Not on file  . Attends Archivist Meetings: Not on file  . Marital Status: Not on file   Past Surgical History:  Procedure Laterality Date  . CHOLECYSTECTOMY  2006  . COLONOSCOPY WITH PROPOFOL N/A 02/01/2017   Procedure: COLONOSCOPY WITH PROPOFOL;  Surgeon: Toledo, Benay Pike, MD;  Location: [facility];  Service: Gastroenterology;  Laterality: N/A;  . ESOPHAGOGASTRODUODENOSCOPY (EGD) WITH PROPOFOL N/A 02/01/2017   Procedure: ESOPHAGOGASTRODUODENOSCOPY (EGD) WITH PROPOFOL;  Surgeon: Toledo, Benay Pike, MD;  Location: [facility];  Service: Gastroenterology;   Laterality: N/A;  . HERNIA REPAIR  08/2010  . PROSTATECTOMY  2003   Past Surgical History:  Procedure Laterality Date  . CHOLECYSTECTOMY  2006  . COLONOSCOPY WITH PROPOFOL N/A 02/01/2017   Procedure: COLONOSCOPY WITH PROPOFOL;  Surgeon: Toledo, Benay Pike, MD;  Location: [facility];  Service: Gastroenterology;  Laterality: N/A;  . ESOPHAGOGASTRODUODENOSCOPY (EGD) WITH PROPOFOL N/A 02/01/2017   Procedure: ESOPHAGOGASTRODUODENOSCOPY (EGD) WITH PROPOFOL;  Surgeon: Toledo, Benay Pike, MD;  Location: [facility];  Service: Gastroenterology;  Laterality: N/A;  . HERNIA REPAIR  08/2010  . PROSTATECTOMY  2003   Past Medical History:  Diagnosis Date  . Dyslipidemia   . H/O prostate cancer 2003   s/p radical prostatectomy (uro Dr. Rush Farmer in [place] yearly)  . Headache 2012   thorough w/u - unrevealing.  has seen 3 neurologists, MRI, LP nonacute.  ESR = 3  . HTN (hypertension)   . Hypogonadism male   . Low testosterone   . Migraines   . OSA on CPAP   . 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 Vitamin D deficiency    BP 128/73   Pulse 62   Temp 97.7 F (36.5 C)   Ht 6' (1.829 m)   Wt 189 lb (85.7 kg)   SpO2 98%   BMI 25.63 kg/m   Opioid Risk Score:   Fall Risk Score:  `1  Depression screen PHQ 2/9  Depression screen [facility] 2/9 01/30/2020 12/10/2019 04/04/2019 10/18/2018 08/13/2018 07/13/2018 06/20/2018  Decreased Interest 0 0 0 0 0 0 0  Down, Depressed, Hopeless 0 0 0 0 0 0 0  PHQ - 2 Score 0 0 0 0 0 0 0   Review of Systems  Constitutional: Negative.   HENT: Negative.   Eyes: Negative.   Respiratory: Negative.   Cardiovascular: Negative.   Gastrointestinal: Negative.   Endocrine: Negative.   Genitourinary: Negative.   Musculoskeletal: Positive for neck pain.       Neck, hip, shoulders  Skin: Negative.   Allergic/Immunologic: Negative.   Neurological: Negative.   Hematological: Negative.   Psychiatric/Behavioral: Negative.         Objective:   Physical Exam Constitutional: No distress .  Respiratory: Normal effort.  Psych: Normal mood. Normal behavior. Neuro: Alert Strength grossly WNL    Assessment & Plan:  Male with pmh of Vit D deficiency, OSA, migraines, low testosterone, HTN, presents with burning in face and shoulders.   1. 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  Lidoderm patch/ointment, Zyrtec, Tomapax, Almond milk, Elavil without benefit Unable to trial carbamazepine  as patient is on Eliquis Steroids made symptoms more severe Side effects with Cymbalta, oxcarbazepine, Pamelor Will consider PT for neck ROM and cervical traction and TENS Will consider referral to Psychology - CBT as rest and no activity seem to exacerbate the pain  Patient to inquire about Accupuncture if necessary after trial hypnotherapy Will consider Vascular/Rheum referral for Raynaud like symptoms ?Horner's like syndrome (delayed pupil relfex, history of ptosis s/p surgery) Will consider Sympathetic ganglion block/ESIs, however worse with oral steroids. Patient states main 2 goals are travel and play with grandchildren, which he is able to do more Continue 100, 100, 100 mg Lyrica, encouraged with food  Good benefit with capsaicin cream on shoulders, however, not using at present because he wants to trial hypnotherapy Encouraged Mediterranean diet, believes his wife is following Cont Mag 257m daily Will consider trigger point injections Will consider  Lidocaine/Ketamine infusion Will consider Gabapentin at higher doses Will consider Botox injection Will consider nitropaste  Continue  follow up with hypotherapy, appointment next month  Discussed with ID - small likelihood of benefit with referral [facility]  Will consider Yoga/medication  Discussed with pharmacy - no available medications   2. Sleep disturbance             See #1  Continue CPAP  Improving  3. Vitamin B12 deficiency Level419on6/14,increased supplement  4. Vitamin D Level34.4on6/14 Continuesupplement to8000 units daily  5. Acute on chronic Headaches             No benefit with Topamax 25 qhs with exacerbation in burning  Has started again  Trial essential oils  Recommend headache dairy

## 2020-02-16 ENCOUNTER — Other Ambulatory Visit: Payer: Self-pay | Admitting: Physical Medicine & Rehabilitation

## 2020-02-27 ENCOUNTER — Encounter: Payer: Medicare PPO | Admitting: Physical Medicine & Rehabilitation

## 2020-03-02 ENCOUNTER — Encounter: Payer: Medicare PPO | Attending: Physical Medicine & Rehabilitation | Admitting: Physical Medicine & Rehabilitation

## 2020-03-02 ENCOUNTER — Encounter: Payer: Self-pay | Admitting: Physical Medicine & Rehabilitation

## 2020-03-02 ENCOUNTER — Other Ambulatory Visit: Payer: Self-pay

## 2020-03-02 VITALS — BP 112/63 | HR 66 | Temp 97.9°F | Ht 72.0 in | Wt 192.0 lb

## 2020-03-02 DIAGNOSIS — M792 Neuralgia and neuritis, unspecified: Secondary | ICD-10-CM | POA: Diagnosis present

## 2020-03-02 DIAGNOSIS — G501 Atypical facial pain: Secondary | ICD-10-CM | POA: Insufficient documentation

## 2020-03-02 DIAGNOSIS — G44229 Chronic tension-type headache, not intractable: Secondary | ICD-10-CM | POA: Diagnosis present

## 2020-03-02 MED ORDER — PREGABALIN 100 MG PO CAPS
100.0000 mg | ORAL_CAPSULE | Freq: Three times a day (TID) | ORAL | 3 refills | Status: DC
Start: 2020-03-02 — End: 2020-04-06

## 2020-03-02 NOTE — Progress Notes (Addendum)
Subjective:    Patient ID: Evan Robinson, male    DOB: 23-Dec-1945, 74 y.o.   MRN: [id number]  HPI Male with pmh of Vit D deficiency, OSA, migraines, low testosterone, HTN, presents with burning in face and shoulders.   Initially stated: Started ~12/2016. He went to IM, Endo, Derm with negative workup. He had lab work for flushing and workup was negative. He saw Neurology, started on Cymbalta and Topomax without benefit. He had brain/C-spine MRI, which showed cervical stenosis. He saw Neurology and was told not radiculopathy related. Progressively getting worse.  Denies alleviating factors. Associated cold hands. Stationary positions exacerbate the pain.  Burning type pain. Radiates to all extremities at times. Denies associated weakness. Denies falls. Wants to travel and grandchildren. Pheochromocytoma workup negative. States he has tried everything. Carotid ultrasound negative per patient.   Last clinic visit on  01/30/20. Since that time, pt states he is taking Lyrica as prescribed. He states he did not go for hypotherapy because of helping his son out with financial/health problems. He notes some benefit with essential oils - he notes improvements in headaches.  Pain Inventory Average Pain 7 Pain Right Now 7 My pain is constant and burning  In the last 24 hours, has pain interfered with the following? General activity 4 Relation with others 4 Enjoyment of life 5 What TIME of day is your pain at its worst? evening and night Sleep (in general) Fair  Pain is worse with: sitting and inactivity Pain improves with: medication Relief from Meds: 6  Family History  Problem Relation Age of Onset  . Alcohol abuse Father   . Cancer Father        prostate  . CAD Father        possible MI  . Alcohol abuse Paternal Grandfather   . Stroke Mother   . Diabetes Neg Hx    Social History   Socioeconomic History  . Marital status: Married    Spouse name: Not on file  . Number of children: Not  on file  . Years of education: Not on file  . Highest education level: Not on file  Occupational History  . Not on file  Tobacco Use  . Smoking status: Never Smoker  . Smokeless tobacco: Never Used  Vaping Use  . Vaping Use: Never used  Substance and Sexual Activity  . Alcohol use: No  . Drug use: No  . Sexual activity: Not on file  Other Topics Concern  . Not on file  Social History Narrative   "Gene"   Lives with wife   Occupation: retired, was Pharmacist, hospital   Activity: walks daily, some gym   Diet:    Social Determinants of Radio broadcast assistant Strain:   . Difficulty of Paying Living Expenses: Not on file  Food Insecurity:   . Worried About Charity fundraiser in the Last Year: Not on file  . Ran Out of Food in the Last Year: Not on file  Transportation Needs:   . Lack of Transportation (Medical): Not on file  . Lack of Transportation (Non-Medical): Not on file  Physical Activity:   . Days of Exercise per Week: Not on file  . Minutes of Exercise per Session: Not on file  Stress:   . Feeling of Stress : Not on file  Social Connections:   . Frequency of Communication with Friends and Family: Not on file  . Frequency of Social Gatherings with Friends and Family:  Not on file  . Attends Religious Services: Not on file  . Active Member of Clubs or Organizations: Not on file  . Attends Archivist Meetings: Not on file  . Marital Status: Not on file   Past Surgical History:  Procedure Laterality Date  . CHOLECYSTECTOMY  2006  . COLONOSCOPY WITH PROPOFOL N/A 02/01/2017   Procedure: COLONOSCOPY WITH PROPOFOL;  Surgeon: Toledo, Benay Pike, MD;  Location: [facility];  Service: Gastroenterology;  Laterality: N/A;  . ESOPHAGOGASTRODUODENOSCOPY (EGD) WITH PROPOFOL N/A 02/01/2017   Procedure: ESOPHAGOGASTRODUODENOSCOPY (EGD) WITH PROPOFOL;  Surgeon: Toledo, Benay Pike, MD;  Location: [facility];  Service: Gastroenterology;  Laterality: N/A;  . HERNIA  REPAIR  08/2010  . PROSTATECTOMY  2003   Past Surgical History:  Procedure Laterality Date  . CHOLECYSTECTOMY  2006  . COLONOSCOPY WITH PROPOFOL N/A 02/01/2017   Procedure: COLONOSCOPY WITH PROPOFOL;  Surgeon: Toledo, Benay Pike, MD;  Location: [facility];  Service: Gastroenterology;  Laterality: N/A;  . ESOPHAGOGASTRODUODENOSCOPY (EGD) WITH PROPOFOL N/A 02/01/2017   Procedure: ESOPHAGOGASTRODUODENOSCOPY (EGD) WITH PROPOFOL;  Surgeon: Toledo, Benay Pike, MD;  Location: [facility];  Service: Gastroenterology;  Laterality: N/A;  . HERNIA REPAIR  08/2010  . PROSTATECTOMY  2003   Past Medical History:  Diagnosis Date  . Dyslipidemia   . H/O prostate cancer 2003   s/p radical prostatectomy (uro Dr. Rush Farmer in [place] yearly)  . Headache 2012   thorough w/u - unrevealing.  has seen 3 neurologists, MRI, LP nonacute.  ESR = 3  . HTN (hypertension)   . Hypogonadism male   . Low testosterone   . Migraines   . OSA on CPAP   . 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 Vitamin D deficiency    BP 112/63   Pulse 66   Temp 97.9 F (36.6 C)   Ht 6' (1.829 m)   Wt 192 lb (87.1 kg)   SpO2 98%   BMI 26.04 kg/m   Opioid Risk Score:   Fall Risk Score:  `1  Depression screen PHQ 2/9  Depression screen [facility] 2/9 01/30/2020 12/10/2019 04/04/2019 10/18/2018 08/13/2018 07/13/2018 06/20/2018  Decreased Interest 0 0 0 0 0 0 0  Down, Depressed, Hopeless 0 0 0 0 0 0 0  PHQ - 2 Score 0 0 0 0 0 0 0   Review of Systems  Constitutional: Negative.   HENT: Negative.   Eyes: Negative.   Respiratory: Negative.   Cardiovascular: Negative.   Gastrointestinal: Negative.   Endocrine: Negative.   Genitourinary: Negative.   Musculoskeletal: Positive for arthralgias and neck pain.       Neck, hip, shoulders  Skin: Negative.   Allergic/Immunologic: Negative.   Neurological: Negative.        Facial pain, burning Neuropathic pain  Hematological: Negative.    Psychiatric/Behavioral: Negative.   All other systems reviewed and are negative.     Objective:   Physical Exam  Constitutional: No distress . Vital signs reviewed. HENT: Normocephalic.  Atraumatic. Eyes: EOMI. No discharge. Cardiovascular: No JVD.   Respiratory: Normal effort.  No stridor.   GI: Non-distended.   Skin: Warm and dry.  Intact. Psych: Normal mood.  Normal behavior. Musc: No edema in extremities.  No tenderness in extremities. Neuro: Alert Strength grossly WNL    Assessment & Plan:  Male with pmh of Vit D deficiency, OSA, migraines, low testosterone, HTN, presents with burning in face and shoulders.  1. 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  Lidoderm patch/ointment, Zyrtec, Tomapax, Almond milk, Elavil without benefit Unable to trial carbamazepine as patient is on Eliquis Steroids made symptoms more severe Side effects with Cymbalta, oxcarbazepine, Pamelor Will consider PT for neck ROM and cervical traction and TENS Will consider referral to Psychology - CBT as rest and no activity seem to exacerbate the pain  Patient to inquire about Accupuncture if necessary after trial hypnotherapy Will consider Vascular/Rheum referral for Raynaud like symptoms ?Horner's like syndrome (delayed pupil relfex, history of ptosis s/p surgery) Will consider Sympathetic ganglion block/ESIs, however worse with oral steroids. Patient states main 2 goals are travel and play with grandchildren, which he is able to do more  Continue 100, 100, 100 mg Lyrica, encouraged  with food  Good benefit with capsaicin cream on shoulders, however, not using at present because he wants to trial hypnotherapy Encouraged Mediterranean diet, believes his wife is following Cont Mag 271m daily Will consider trigger point injections Will consider Lidocaine/Ketamine infusion Will consider Gabapentin at higher doses Will consider Botox injection Will consider nitropaste  Continue follow up with hypotherapy,   Discussed with ID - small likelihood of benefit with referral to ID at academic center  Will consider Yoga/medication  Discussed with pharmacy - no available medications   Will refer for acupuncture  2. Sleep disturbance             See #1  Continue CPAP  Improving  3. Vitamin B12 deficiency Level419on6/14,increased supplement  4. Vitamin D Level34.4on6/14 Continuesupplement to8000 units daily  5. Acute on chronic Headaches             No benefit with Topamax 25 qhs with exacerbation in burning  Essential oils with benefit  Recommend headache dairy  Improving

## 2020-03-02 NOTE — Addendum Note (Signed)
Addended by: Maryla Morrow A on: 03/02/2020 10:24 AM   Modules accepted: Orders

## 2020-04-06 ENCOUNTER — Other Ambulatory Visit: Payer: Self-pay

## 2020-04-06 ENCOUNTER — Encounter: Payer: Medicare PPO | Attending: Physical Medicine & Rehabilitation | Admitting: Physical Medicine & Rehabilitation

## 2020-04-06 ENCOUNTER — Encounter: Payer: Self-pay | Admitting: Physical Medicine & Rehabilitation

## 2020-04-06 VITALS — BP 150/83 | HR 66 | Temp 97.9°F | Ht 72.0 in | Wt 193.2 lb

## 2020-04-06 DIAGNOSIS — M792 Neuralgia and neuritis, unspecified: Secondary | ICD-10-CM | POA: Diagnosis present

## 2020-04-06 DIAGNOSIS — G44229 Chronic tension-type headache, not intractable: Secondary | ICD-10-CM

## 2020-04-06 DIAGNOSIS — G501 Atypical facial pain: Secondary | ICD-10-CM

## 2020-04-06 MED ORDER — PREGABALIN 100 MG PO CAPS
100.0000 mg | ORAL_CAPSULE | Freq: Three times a day (TID) | ORAL | 2 refills | Status: DC
Start: 2020-04-06 — End: 2020-06-09

## 2020-04-06 MED ORDER — PREGABALIN 50 MG PO CAPS
50.0000 mg | ORAL_CAPSULE | Freq: Every day | ORAL | 2 refills | Status: DC
Start: 2020-04-06 — End: 2020-09-18

## 2020-04-06 NOTE — Progress Notes (Signed)
Subjective:    Patient ID: Evan Robinson, male    DOB: [birth year], 74 y.o.   MRN: [id number]  HPI Male with pmh of Vit D deficiency, OSA, migraines, low testosterone, HTN, presents with burning in face and shoulders.   Initially stated: Started ~12/2016. He went to IM, Endo, Derm with negative workup. He had lab work for flushing and workup was negative. He saw Neurology, started on Cymbalta and Topomax without benefit. He had brain/C-spine MRI, which showed cervical stenosis. He saw Neurology and was told not radiculopathy related. Progressively getting worse.  Denies alleviating factors. Associated cold hands. Stationary positions exacerbate the pain.  Burning type pain. Radiates to all extremities at times. Denies associated weakness. Denies falls. Wants to travel and grandchildren. Pheochromocytoma workup negative. States he has tried everything. Carotid ultrasound negative per patient.   Last clinic visit on  03/02/2020.  Since that time, patient states he is taking Lyrica. He states he had not heard back from our office regarding acupuncture. He had cataract surgery since last visit with plans for other eye next month.  He has been having headaches because of his eyes. He has not been back to hypnotherapy.   Pain Inventory Average Pain 7 Pain Right Now 7 My pain is constant and burning  In the last 24 hours, has pain interfered with the following? General activity 4 Relation with others 3 Enjoyment of life 4 What TIME of day is your pain at its worst? evening and night Sleep (in general) Fair  Pain is worse with: sitting and inactivity Pain improves with: medication Relief from Meds: 4  Family History  Problem Relation Age of Onset  . Alcohol abuse Father   . Cancer Father        prostate  . CAD Father        possible MI  . Alcohol abuse Paternal Grandfather   . Stroke Mother   . Diabetes Neg Hx    Social History   Socioeconomic History  . Marital status: Married     Spouse name: Not on file  . Number of children: Not on file  . Years of education: Not on file  . Highest education level: Not on file  Occupational History  . Not on file  Tobacco Use  . Smoking status: Never Smoker  . Smokeless tobacco: Never Used  Vaping Use  . Vaping Use: Never used  Substance and Sexual Activity  . Alcohol use: No  . Drug use: No  . Sexual activity: Not on file  Other Topics Concern  . Not on file  Social History Narrative   "Gene"   Lives with wife   Occupation: retired, was Pharmacist, hospital   Activity: walks daily, some gym   Diet:    Social Determinants of Radio broadcast assistant Strain:   . Difficulty of Paying Living Expenses: Not on file  Food Insecurity:   . Worried About Charity fundraiser in the Last Year: Not on file  . Ran Out of Food in the Last Year: Not on file  Transportation Needs:   . Lack of Transportation (Medical): Not on file  . Lack of Transportation (Non-Medical): Not on file  Physical Activity:   . Days of Exercise per Week: Not on file  . Minutes of Exercise per Session: Not on file  Stress:   . Feeling of Stress : Not on file  Social Connections:   . Frequency of Communication with Friends and Family:  Not on file  . Frequency of Social Gatherings with Friends and Family: Not on file  . Attends Religious Services: Not on file  . Active Member of Clubs or Organizations: Not on file  . Attends Archivist Meetings: Not on file  . Marital Status: Not on file   Past Surgical History:  Procedure Laterality Date  . CHOLECYSTECTOMY  2006  . COLONOSCOPY WITH PROPOFOL N/A 02/01/2017   Procedure: COLONOSCOPY WITH PROPOFOL;  Surgeon: Toledo, Benay Pike, MD;  Location: [facility];  Service: Gastroenterology;  Laterality: N/A;  . ESOPHAGOGASTRODUODENOSCOPY (EGD) WITH PROPOFOL N/A 02/01/2017   Procedure: ESOPHAGOGASTRODUODENOSCOPY (EGD) WITH PROPOFOL;  Surgeon: Toledo, Benay Pike, MD;  Location: [facility];  Service:  Gastroenterology;  Laterality: N/A;  . HERNIA REPAIR  08/2010  . PROSTATECTOMY  2003   Past Surgical History:  Procedure Laterality Date  . CHOLECYSTECTOMY  2006  . COLONOSCOPY WITH PROPOFOL N/A 02/01/2017   Procedure: COLONOSCOPY WITH PROPOFOL;  Surgeon: Toledo, Benay Pike, MD;  Location: [facility];  Service: Gastroenterology;  Laterality: N/A;  . ESOPHAGOGASTRODUODENOSCOPY (EGD) WITH PROPOFOL N/A 02/01/2017   Procedure: ESOPHAGOGASTRODUODENOSCOPY (EGD) WITH PROPOFOL;  Surgeon: Toledo, Benay Pike, MD;  Location: [facility];  Service: Gastroenterology;  Laterality: N/A;  . HERNIA REPAIR  08/2010  . PROSTATECTOMY  2003   Past Medical History:  Diagnosis Date  . Dyslipidemia   . H/O prostate cancer 2003   s/p radical prostatectomy (uro Dr. Rush Farmer in [place] yearly)  . Headache 2012   thorough w/u - unrevealing.  has seen 3 neurologists, MRI, LP nonacute.  ESR = 3  . HTN (hypertension)   . Hypogonadism male   . Low testosterone   . Migraines   . OSA on CPAP   . 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 Vitamin D deficiency    BP (!) 150/83   Pulse 66   Temp 97.9 F (36.6 C)   Ht 6' (1.829 m)   Wt 193 lb 3.2 oz (87.6 kg)   SpO2 98%   BMI 26.20 kg/m   Opioid Risk Score:   Fall Risk Score:  `1  Depression screen PHQ 2/9  Depression screen [facility] 2/9 01/30/2020 12/10/2019 04/04/2019 10/18/2018 08/13/2018 07/13/2018 06/20/2018  Decreased Interest 0 0 0 0 0 0 0  Down, Depressed, Hopeless 0 0 0 0 0 0 0  PHQ - 2 Score 0 0 0 0 0 0 0   Review of Systems  Constitutional: Negative.   HENT: Negative.   Eyes: Negative.   Respiratory: Negative.   Cardiovascular: Negative.   Gastrointestinal: Negative.   Endocrine: Negative.   Genitourinary: Negative.   Musculoskeletal: Positive for arthralgias and neck pain.       Neck, hip, shoulders  Skin: Negative.   Allergic/Immunologic: Negative.   Neurological:       Facial pain,  burning Neuropathic pain  Hematological: Negative.   Psychiatric/Behavioral: Negative.   All other systems reviewed and are negative.     Objective:   Physical Exam  Constitutional: No distress . Vital signs reviewed. HENT: Normocephalic.  Atraumatic. Eyes: EOMI. No discharge. Cardiovascular: No JVD.   Respiratory: Normal effort.  No stridor.   GI: Non-distended.   Skin: Warm and dry.  Intact. Psych: Normal mood.  Normal behavior. Musc: No edema in extremities.  No tenderness in extremities. Neuro: Alert Strength grossly WNL    Assessment & Plan:  Male with pmh of Vit D deficiency,  OSA, migraines, low testosterone, HTN, presents with burning in face and shoulders.   1. 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  Lidoderm patch/ointment, Zyrtec, Tomapax, Almond milk, Elavil without benefit Unable to trial carbamazepine as patient is on Eliquis Steroids made symptoms more severe Side effects with Cymbalta, oxcarbazepine, Pamelor Will consider PT for neck ROM and cervical traction and TENS Will consider referral to Psychology - CBT as rest and no activity seem to exacerbate the pain  Will consider Vascular/Rheum referral for Raynaud like symptoms ?Horner's like syndrome (delayed pupil relfex, history of ptosis s/p surgery) Will consider Sympathetic ganglion block/ESIs, however worse with oral steroids. Patient states main 2 goals are travel and play with grandchildren, which he is able to do more  Continue 100, 100, 100 mg Lyrica, encouraged with food  Good benefit with  capsaicin cream on shoulders, however, not using at present because he wants to trial hypnotherapy, which is also on hold now Encouraged Mediterranean diet, believes his wife is following Cont Mag 215m daily Will consider trigger point injections Will consider Lidocaine/Ketamine infusion Will consider Gabapentin at higher doses Will consider Botox injection Will consider nitropaste  Continue follow up with hypotherapy,   Discussed with ID - small likelihood of benefit with referral [facility]  Will consider Yoga/medication  Discussed with pharmacy - no available medications   Referred for acupuncture, awaiting  2. Sleep disturbance             See #1  Continue CPAP  Improving  3. Vitamin B12 deficiency Level419on6/14,increased supplement, continue  4. Vitamin D Level34.4on6/14 Continuesupplement to8000 units daily  5. Acute on chronic Headaches             No benefit with Topamax 25 qhs with exacerbation in burning  Essential oils with benefit  Recommend headache dairy  Was improving, now exacerbated due to visual changes

## 2020-05-11 ENCOUNTER — Encounter: Payer: Self-pay | Admitting: Physical Medicine & Rehabilitation

## 2020-05-11 ENCOUNTER — Encounter: Payer: Medicare PPO | Attending: Physical Medicine & Rehabilitation | Admitting: Physical Medicine & Rehabilitation

## 2020-05-11 ENCOUNTER — Other Ambulatory Visit: Payer: Self-pay

## 2020-05-11 VITALS — BP 136/75 | HR 62 | Temp 97.9°F | Ht 72.0 in | Wt 195.2 lb

## 2020-05-11 DIAGNOSIS — M792 Neuralgia and neuritis, unspecified: Secondary | ICD-10-CM

## 2020-05-11 DIAGNOSIS — G479 Sleep disorder, unspecified: Secondary | ICD-10-CM | POA: Diagnosis present

## 2020-05-11 DIAGNOSIS — G501 Atypical facial pain: Secondary | ICD-10-CM | POA: Diagnosis present

## 2020-05-11 DIAGNOSIS — E538 Deficiency of other specified B group vitamins: Secondary | ICD-10-CM

## 2020-05-11 MED ORDER — NONFORMULARY OR COMPOUNDED ITEM: 1 refills | Status: DC

## 2020-05-11 NOTE — Progress Notes (Addendum)
Subjective:    Patient ID: Evan Robinson, male    DOB: January 04, 1946, 75 y.o.   MRN: [id number]  HPI Male with pmh of Vit D deficiency, OSA, migraines, low testosterone, HTN, presents with burning in face and shoulders.   Initially stated: Started ~12/2016. He went to IM, Endo, Derm with negative workup. He had lab work for flushing and workup was negative. He saw Neurology, started on Cymbalta and Topomax without benefit. He had brain/C-spine MRI, which showed cervical stenosis. He saw Neurology and was told not radiculopathy related. Progressively getting worse.  Denies alleviating factors. Associated cold hands. Stationary positions exacerbate the pain.  Burning type pain. Radiates to all extremities at times. Denies associated weakness. Denies falls. Wants to travel and grandchildren. Pheochromocytoma workup negative. States he has tried everything. Carotid ultrasound negative per patient.   Last clinic visit on  04/06/20.  Since that time, pt states he continues with Lyrica. He states Accupuncture was too expensive. Sleep has improved. Overall pain has been better in the last month.   Pain Inventory Average Pain 7 Pain Right Now 7 My pain is constant and burning  In the last 24 hours, has pain interfered with the following? General activity 3 Relation with others 4 Enjoyment of life 5 What TIME of day is your pain at its worst? evening and night Sleep (in general) Fair  Pain is worse with: sitting and inactivity Pain improves with: medication Relief from Meds: 6  Family History  Problem Relation Age of Onset  . Alcohol abuse Father   . Cancer Father        prostate  . CAD Father        possible MI  . Alcohol abuse Paternal Grandfather   . Stroke Mother   . Diabetes Neg Hx    Social History   Socioeconomic History  . Marital status: Married    Spouse name: Not on file  . Number of children: Not on file  . Years of education: Not on file  . Highest education level: Not on  file  Occupational History  . Not on file  Tobacco Use  . Smoking status: Never Smoker  . Smokeless tobacco: Never Used  Vaping Use  . Vaping Use: Never used  Substance and Sexual Activity  . Alcohol use: No  . Drug use: No  . Sexual activity: Not on file  Other Topics Concern  . Not on file  Social History Narrative   "Gene"   Lives with wife   Occupation: retired, was Pharmacist, hospital   Activity: walks daily, some gym   Diet:    Social Determinants of Radio broadcast assistant Strain: Not on file  Food Insecurity: Not on file  Transportation Needs: Not on file  Physical Activity: Not on file  Stress: Not on file  Social Connections: Not on file   Past Surgical History:  Procedure Laterality Date  . CHOLECYSTECTOMY  2006  . COLONOSCOPY WITH PROPOFOL N/A 02/01/2017   Procedure: COLONOSCOPY WITH PROPOFOL;  Surgeon: Toledo, Benay Pike, MD;  Location: [facility];  Service: Gastroenterology;  Laterality: N/A;  . ESOPHAGOGASTRODUODENOSCOPY (EGD) WITH PROPOFOL N/A 02/01/2017   Procedure: ESOPHAGOGASTRODUODENOSCOPY (EGD) WITH PROPOFOL;  Surgeon: Toledo, Benay Pike, MD;  Location: [facility];  Service: Gastroenterology;  Laterality: N/A;  . HERNIA REPAIR  08/2010  . PROSTATECTOMY  2003   Past Surgical History:  Procedure Laterality Date  . CHOLECYSTECTOMY  2006  . COLONOSCOPY WITH PROPOFOL N/A 02/01/2017  Procedure: COLONOSCOPY WITH PROPOFOL;  Surgeon: Toledo, Benay Pike, MD;  Location: [facility];  Service: Gastroenterology;  Laterality: N/A;  . ESOPHAGOGASTRODUODENOSCOPY (EGD) WITH PROPOFOL N/A 02/01/2017   Procedure: ESOPHAGOGASTRODUODENOSCOPY (EGD) WITH PROPOFOL;  Surgeon: Toledo, Benay Pike, MD;  Location: [facility];  Service: Gastroenterology;  Laterality: N/A;  . HERNIA REPAIR  08/2010  . PROSTATECTOMY  2003   Past Medical History:  Diagnosis Date  . Dyslipidemia   . H/O prostate cancer 2003   s/p radical prostatectomy (uro Dr. Rush Farmer in [place] yearly)   . Headache 2012   thorough w/u - unrevealing.  has seen 3 neurologists, MRI, LP nonacute.  ESR = 3  . HTN (hypertension)   . Hypogonadism male   . Low testosterone   . Migraines   . OSA on CPAP   . 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 Vitamin D deficiency    BP 136/75   Pulse 62   Temp 97.9 F (36.6 C)   Ht 6' (1.829 m)   Wt 195 lb 3.2 oz (88.5 kg)   SpO2 98%   BMI 26.47 kg/m   Opioid Risk Score:   Fall Risk Score:  `1  Depression screen PHQ 2/9  Depression screen [facility] 2/9 05/11/2020 01/30/2020 12/10/2019 04/04/2019 10/18/2018 08/13/2018 07/13/2018  Decreased Interest 0 0 0 0 0 0 0  Down, Depressed, Hopeless - 0 0 0 0 0 0  PHQ - 2 Score 0 0 0 0 0 0 0   Review of Systems  Constitutional: Negative.   HENT: Negative.   Eyes: Negative.   Respiratory: Negative.   Cardiovascular: Negative.   Gastrointestinal: Negative.   Endocrine: Negative.   Genitourinary: Negative.   Musculoskeletal: Positive for arthralgias and neck pain.       Neck, hip, shoulders  Skin: Negative.   Allergic/Immunologic: Negative.   Neurological:       Facial pain, burning Neuropathic pain  Hematological: Negative.   Psychiatric/Behavioral: Negative.   All other systems reviewed and are negative.     Objective:   Physical Exam  Constitutional: No distress . Vital signs reviewed. HENT: Normocephalic.  Atraumatic. Eyes: EOMI. No discharge. Cardiovascular: No JVD.   Respiratory: Normal effort.  No stridor.   GI: Non-distended.   Skin: Warm and dry.  Intact. Psych: Normal mood.  Normal behavior. Musc: No edema in extremities.  No tenderness in extremities. Neuro: Alert Strength grossly WNL    Assessment & Plan:  Male with pmh of Vit D deficiency, OSA, migraines, low testosterone, HTN, presents with burning in face and shoulders.   1. 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  Lidoderm patch/ointment, Zyrtec, Tomapax, Almond milk, Elavil, Keatmin infusion (cound not tolerate) without benefit Unable to trial carbamazepine as patient is on Eliquis Steroids made symptoms more severe Side effects with Cymbalta, oxcarbazepine, Pamelor Will consider PT for neck ROM and cervical traction and TENS Will consider referral to Psychology - CBT as rest and no activity seem to exacerbate the pain  Will consider Vascular/Rheum referral for Raynaud like symptoms ?Horner's like syndrome (delayed pupil relfex, history of ptosis s/p surgery) Will consider Sympathetic ganglion block/ESIs, however worse with oral steroids. Patient states main 2 goals are travel and play with grandchildren,  which he is able to do more             Continue 100, 100, 100 mg Lyrica, encouraged with food             Good benefit with capsaicin cream on shoulders, however, not using at present because he wants to trial hypnotherapy, which is also on hold now Encouraged Mediterranean diet, believes his wife is following Cont Mag 28m daily Will consider trigger point injections Will consider Lidocaine/Ketamineinfusion Will consider Gabapentin at higher doses Will consider Botox injection Will consider nitropaste             Continuefollow up with hypotherapy,              Discussed with ID - small likelihood of benefit with referral [facility] at [facility]             Will consider Yoga/medication             Discussed with pharmacy - no available medications               Acupuncture too costly here, will inquire at another location  Will order compound medication  2. Sleep disturbance See #1             Continue CPAP             Improved  3. Vitamin B12 deficiency Level419on6/14,increasedsupplement, continue  4. Vitamin D Level34.4on6/14  Continue to8000 units daily  5. Acute on chronic Headaches No benefit with Topamax 25 qhs with exacerbation in burning             Essential oils with benefit             Recommend headache dairy  Improving

## 2020-05-13 ENCOUNTER — Telehealth: Payer: Self-pay | Admitting: *Deleted

## 2020-05-13 NOTE — Telephone Encounter (Signed)
[facility] called for a disp amount of Evan Robinson compounded cream.  Typical disp is 60 grams, but can be increased in 30 gm increments.  Please advise.

## 2020-05-13 NOTE — Telephone Encounter (Signed)
I called the dispense of 60 gm container to [facility].  You do not have directions for application.  Please advise.

## 2020-05-13 NOTE — Addendum Note (Signed)
Addended by: Doreene Eland on: 05/13/2020 04:55 PM   Modules accepted: Orders

## 2020-05-13 NOTE — Telephone Encounter (Signed)
We can start off with 60 g.  Thanks.

## 2020-05-13 NOTE — Telephone Encounter (Signed)
Apply to affected areas BID PRN.  Thanks.

## 2020-05-20 MED ORDER — NONFORMULARY OR COMPOUNDED ITEM
1 refills | Status: DC
Start: 2020-05-20 — End: 2021-03-24

## 2020-05-20 NOTE — Addendum Note (Signed)
Addended by: Maryla Morrow A on: 05/20/2020 12:17 PM   Modules accepted: Orders

## 2020-06-09 ENCOUNTER — Other Ambulatory Visit: Payer: Self-pay

## 2020-06-09 ENCOUNTER — Encounter: Payer: Medicare PPO | Attending: Physical Medicine & Rehabilitation | Admitting: Physical Medicine & Rehabilitation

## 2020-06-09 ENCOUNTER — Encounter: Payer: Self-pay | Admitting: Physical Medicine & Rehabilitation

## 2020-06-09 VITALS — BP 132/75 | HR 61 | Temp 97.9°F | Ht 72.0 in | Wt 194.6 lb

## 2020-06-09 DIAGNOSIS — G479 Sleep disorder, unspecified: Secondary | ICD-10-CM | POA: Diagnosis present

## 2020-06-09 DIAGNOSIS — G501 Atypical facial pain: Secondary | ICD-10-CM | POA: Diagnosis present

## 2020-06-09 DIAGNOSIS — M792 Neuralgia and neuritis, unspecified: Secondary | ICD-10-CM | POA: Diagnosis present

## 2020-06-09 MED ORDER — PREGABALIN 100 MG PO CAPS
100.0000 mg | ORAL_CAPSULE | Freq: Three times a day (TID) | ORAL | 2 refills | Status: DC
Start: 2020-06-09 — End: 2020-07-09

## 2020-06-09 NOTE — Progress Notes (Signed)
Subjective:    Patient ID: Evan Robinson, male    DOB: 08-May-1945, 75 y.o.   MRN: [id number]  HPI Male with pmh of Vit D deficiency, OSA, migraines, low testosterone, HTN, presents with burning in face and shoulders.   Initially stated: Started ~12/2016. He went to IM, Endo, Derm with negative workup. He had lab work for flushing and workup was negative. He saw Neurology, started on Cymbalta and Topomax without benefit. He had brain/C-spine MRI, which showed cervical stenosis. He saw Neurology and was told not radiculopathy related. Progressively getting worse.  Denies alleviating factors. Associated cold hands. Stationary positions exacerbate the pain.  Burning type pain. Radiates to all extremities at times. Denies associated weakness. Denies falls. Wants to travel and grandchildren. Pheochromocytoma workup negative. States he has tried everything. Carotid ultrasound negative per patient.   Last clinic visit on 05/11/2020.  Since that time, patient states he continues to take Lyrica. Hypnotherapy is still on hold.  He notes some benefit with compound cream.  He has not followed up acupuncture at another location.   Pain Inventory Average Pain 7 Pain Right Now 7 My pain is intermittent and burning  In the last 24 hours, has pain interfered with the following? General activity 7 Relation with others 6 Enjoyment of life 7 What TIME of day is your pain at its worst? evening and night Sleep (in general) Fair  Pain is worse with: sitting and inactivity Pain improves with: medication Relief from Meds: 5  Family History  Problem Relation Age of Onset  . Alcohol abuse Father   . Cancer Father        prostate  . CAD Father        possible MI  . Alcohol abuse Paternal Grandfather   . Stroke Mother   . Diabetes Neg Hx    Social History   Socioeconomic History  . Marital status: Married    Spouse name: Not on file  . Number of children: Not on file  . Years of education: Not on file   . Highest education level: Not on file  Occupational History  . Not on file  Tobacco Use  . Smoking status: Never Smoker  . Smokeless tobacco: Never Used  Vaping Use  . Vaping Use: Never used  Substance and Sexual Activity  . Alcohol use: No  . Drug use: No  . Sexual activity: Not on file  Other Topics Concern  . Not on file  Social History Narrative   "Evan Robinson"   Lives with wife   Occupation: retired, was Pharmacist, hospital   Activity: walks daily, some gym   Diet:    Social Determinants of Radio broadcast assistant Strain: Not on file  Food Insecurity: Not on file  Transportation Needs: Not on file  Physical Activity: Not on file  Stress: Not on file  Social Connections: Not on file   Past Surgical History:  Procedure Laterality Date  . CHOLECYSTECTOMY  2006  . COLONOSCOPY WITH PROPOFOL N/A 02/01/2017   Procedure: COLONOSCOPY WITH PROPOFOL;  Surgeon: Toledo, Benay Pike, MD;  Location: [facility];  Service: Gastroenterology;  Laterality: N/A;  . ESOPHAGOGASTRODUODENOSCOPY (EGD) WITH PROPOFOL N/A 02/01/2017   Procedure: ESOPHAGOGASTRODUODENOSCOPY (EGD) WITH PROPOFOL;  Surgeon: Toledo, Benay Pike, MD;  Location: [facility];  Service: Gastroenterology;  Laterality: N/A;  . HERNIA REPAIR  08/2010  . PROSTATECTOMY  2003   Past Surgical History:  Procedure Laterality Date  . CHOLECYSTECTOMY  2006  . COLONOSCOPY  WITH PROPOFOL N/A 02/01/2017   Procedure: COLONOSCOPY WITH PROPOFOL;  Surgeon: Toledo, Benay Pike, MD;  Location: [facility];  Service: Gastroenterology;  Laterality: N/A;  . ESOPHAGOGASTRODUODENOSCOPY (EGD) WITH PROPOFOL N/A 02/01/2017   Procedure: ESOPHAGOGASTRODUODENOSCOPY (EGD) WITH PROPOFOL;  Surgeon: Toledo, Benay Pike, MD;  Location: [facility];  Service: Gastroenterology;  Laterality: N/A;  . HERNIA REPAIR  08/2010  . PROSTATECTOMY  2003   Past Medical History:  Diagnosis Date  . Dyslipidemia   . H/O prostate cancer 2003   s/p radical prostatectomy  (uro Dr. Rush Farmer in [place] yearly)  . Headache 2012   thorough w/u - unrevealing.  has seen 3 neurologists, MRI, LP nonacute.  ESR = 3  . HTN (hypertension)   . Hypogonadism male   . Low testosterone   . Migraines   . OSA on CPAP   . 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 Vitamin D deficiency    BP 132/75   Pulse 61   Temp 97.9 F (36.6 C)   Ht 6' (1.829 m)   Wt 194 lb 9.6 oz (88.3 kg)   SpO2 98%   BMI 26.39 kg/m   Opioid Risk Score:   Fall Risk Score:  `1  Depression screen PHQ 2/9  Depression screen [facility] 2/9 06/09/2020 05/11/2020 01/30/2020 12/10/2019 04/04/2019 10/18/2018 08/13/2018  Decreased Interest 0 0 0 0 0 0 0  Down, Depressed, Hopeless 0 - 0 0 0 0 0  PHQ - 2 Score 0 0 0 0 0 0 0   Review of Systems  Constitutional: Negative.   HENT: Negative.   Eyes: Negative.   Respiratory: Negative.   Cardiovascular: Negative.   Gastrointestinal: Negative.   Endocrine: Negative.   Genitourinary: Negative.   Musculoskeletal: Positive for arthralgias and neck pain.       Neck, hip, shoulders  Skin: Negative.   Allergic/Immunologic: Negative.   Neurological:       Facial pain, burning Neuropathic pain  Hematological: Negative.   Psychiatric/Behavioral: Negative.   All other systems reviewed and are negative.     Objective:   Physical Exam  Constitutional: No distress . Vital signs reviewed. HENT: Normocephalic.  Atraumatic. Eyes: EOMI. No discharge. Cardiovascular: No JVD.   Respiratory: Normal effort.  No stridor.   GI: Non-distended.   Skin: Warm and dry.  Intact. Psych: Normal mood.  Normal behavior. Musc: No edema in extremities.  No tenderness in extremities. Neuro: Alert Strength grossly WNL    Assessment & Plan:  Male with pmh of Vit D deficiency, OSA, migraines, low testosterone, HTN, presents with burning in face and shoulders.   1. 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  Lidoderm patch/ointment, Zyrtec, Tomapax, Almond milk, Elavil, Keatmin infusion (cound not tolerate) without benefit Unable to trial carbamazepine as patient is on Eliquis Steroids made symptoms more severe Side effects with Cymbalta, oxcarbazepine, Pamelor Will consider PT for neck ROM and cervical traction and TENS Will consider referral to Psychology - CBT as rest and no activity seem to exacerbate the pain  Will consider Vascular/Rheum referral for Raynaud like symptoms ?Horner's like syndrome (delayed pupil relfex, history of ptosis s/p surgery) Will consider Sympathetic ganglion block/ESIs, however worse with oral steroids. Patient states main 2 goals  are travel and play with grandchildren, which he is able to do more             Continue 100, 100, 100 mg Lyrica, encouraged with food             Good benefit with capsaicin cream on shoulders, however, not using at present because he wants to trial hypnotherapy, which is also on hold now Encouraged Mediterranean diet, believes his wife is following Cont Mag 271m daily Will consider trigger point injections Will consider Lidocaine/Ketamineinfusion Will consider Gabapentin at higher doses Will consider Botox injection Will consider nitropaste             Continuefollow up with hypotherapy,              Discussed with ID - small likelihood of benefit with referral [facility]             Will consider Yoga/medication             Discussed with pharmacy - no available  medications              Acupuncture too costly here, will inquire at another location  Continue compound medication  2. Sleep disturbance See #1             Continue CPAP             Improved  3. Vitamin B12 deficiency Level419on6/14,increasedsupplement, continue  4. Vitamin D Level34.4on6/14  Continue to8000 units daily  5. Acute on chronic Headaches No benefit with Topamax 25 qhs with exacerbation in burning             Essential oils with benefit             Recommend headache dairy  Improving

## 2020-07-09 ENCOUNTER — Other Ambulatory Visit: Payer: Self-pay

## 2020-07-09 ENCOUNTER — Encounter: Payer: Medicare PPO | Attending: Physical Medicine & Rehabilitation | Admitting: Physical Medicine & Rehabilitation

## 2020-07-09 ENCOUNTER — Encounter: Payer: Self-pay | Admitting: Physical Medicine & Rehabilitation

## 2020-07-09 VITALS — BP 118/67 | HR 63 | Temp 97.6°F | Ht 72.0 in | Wt 194.6 lb

## 2020-07-09 DIAGNOSIS — M792 Neuralgia and neuritis, unspecified: Secondary | ICD-10-CM | POA: Diagnosis present

## 2020-07-09 DIAGNOSIS — G479 Sleep disorder, unspecified: Secondary | ICD-10-CM

## 2020-07-09 DIAGNOSIS — G501 Atypical facial pain: Secondary | ICD-10-CM

## 2020-07-09 MED ORDER — BACLOFEN 10 MG PO TABS: 5.0000 mg | ORAL_TABLET | Freq: Three times a day (TID) | ORAL | 1 refills | Status: DC

## 2020-07-09 MED ORDER — PREGABALIN 100 MG PO CAPS
100.0000 mg | ORAL_CAPSULE | Freq: Three times a day (TID) | ORAL | 2 refills | Status: DC
Start: 2020-07-09 — End: 2020-11-27

## 2020-07-09 NOTE — Progress Notes (Addendum)
Subjective:    Patient ID: Evan Robinson, male    DOB: 10-Mar-1946, 76 y.o.   MRN: [id number]  HPI Male with pmh of Vit D deficiency, OSA, migraines, low testosterone, HTN, presents with burning in face and shoulders.   Initially stated: Started ~12/2016. He went to IM, Endo, Derm with negative workup. He had lab work for flushing and workup was negative. He saw Neurology, started on Cymbalta and Topomax without benefit. He had brain/C-spine MRI, which showed cervical stenosis. He saw Neurology and was told not radiculopathy related. Progressively getting worse.  Denies alleviating factors. Associated cold hands. Stationary positions exacerbate the pain.  Burning type pain. Radiates to all extremities at times. Denies associated weakness. Denies falls. Wants to travel and grandchildren. Pheochromocytoma workup negative. States he has tried everything. Carotid ultrasound negative per patient.   Last clinic visit on 06/09/20.  Since that time, he continues to take meds as prescribed. He is no longer going for hypnotherapy. He has not followed up with acupuncture in [place] in 11/2020. He states compound medication made things worse.   Pain Inventory Average Pain 6 Pain Right Now 7 My pain is intermittent and burning  In the last 24 hours, has pain interfered with the following? General activity 7 Relation with others 7 Enjoyment of life 7 What TIME of day is your pain at its worst? evening and night Sleep (in general) Fair  Pain is worse with: sitting and inactivity Pain improves with: rest Relief from Meds: 5  Family History  Problem Relation Age of Onset  . Alcohol abuse Father   . Cancer Father        prostate  . CAD Father        possible MI  . Alcohol abuse Paternal Grandfather   . Stroke Mother   . Diabetes Neg Hx    Social History   Socioeconomic History  . Marital status: Married    Spouse name: Not on file  . Number of children: Not on file  . Years of education: Not  on file  . Highest education level: Not on file  Occupational History  . Not on file  Tobacco Use  . Smoking status: Never Smoker  . Smokeless tobacco: Never Used  Vaping Use  . Vaping Use: Never used  Substance and Sexual Activity  . Alcohol use: No  . Drug use: No  . Sexual activity: Not on file  Other Topics Concern  . Not on file  Social History Narrative   "Gene"   Lives with wife   Occupation: retired, was Pharmacist, hospital   Activity: walks daily, some gym   Diet:    Social Determinants of Radio broadcast assistant Strain: Not on file  Food Insecurity: Not on file  Transportation Needs: Not on file  Physical Activity: Not on file  Stress: Not on file  Social Connections: Not on file   Past Surgical History:  Procedure Laterality Date  . CHOLECYSTECTOMY  2006  . COLONOSCOPY WITH PROPOFOL N/A 02/01/2017   Procedure: COLONOSCOPY WITH PROPOFOL;  Surgeon: Toledo, Benay Pike, MD;  Location: [facility];  Service: Gastroenterology;  Laterality: N/A;  . ESOPHAGOGASTRODUODENOSCOPY (EGD) WITH PROPOFOL N/A 02/01/2017   Procedure: ESOPHAGOGASTRODUODENOSCOPY (EGD) WITH PROPOFOL;  Surgeon: Toledo, Benay Pike, MD;  Location: [facility];  Service: Gastroenterology;  Laterality: N/A;  . HERNIA REPAIR  08/2010  . PROSTATECTOMY  2003   Past Surgical History:  Procedure Laterality Date  . CHOLECYSTECTOMY  2006  .  COLONOSCOPY WITH PROPOFOL N/A 02/01/2017   Procedure: COLONOSCOPY WITH PROPOFOL;  Surgeon: Toledo, Benay Pike, MD;  Location: [facility];  Service: Gastroenterology;  Laterality: N/A;  . ESOPHAGOGASTRODUODENOSCOPY (EGD) WITH PROPOFOL N/A 02/01/2017   Procedure: ESOPHAGOGASTRODUODENOSCOPY (EGD) WITH PROPOFOL;  Surgeon: Toledo, Benay Pike, MD;  Location: [facility];  Service: Gastroenterology;  Laterality: N/A;  . HERNIA REPAIR  08/2010  . PROSTATECTOMY  2003   Past Medical History:  Diagnosis Date  . Dyslipidemia   . H/O prostate cancer 2003   s/p radical  prostatectomy (uro Dr. Rush Farmer in [place] yearly)  . Headache 2012   thorough w/u - unrevealing.  has seen 3 neurologists, MRI, LP nonacute.  ESR = 3  . HTN (hypertension)   . Hypogonadism male   . Low testosterone   . Migraines   . OSA on CPAP   . 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 Vitamin D deficiency    BP 118/67   Pulse 63   Temp 97.6 F (36.4 C)   Ht 6' (1.829 m)   Wt 194 lb 9.6 oz (88.3 kg)   SpO2 98%   BMI 26.39 kg/m   Opioid Risk Score:   Fall Risk Score:  `1  Depression screen PHQ 2/9  Depression screen [facility] 2/9 06/09/2020 05/11/2020 01/30/2020 12/10/2019 04/04/2019 10/18/2018 08/13/2018  Decreased Interest 0 0 0 0 0 0 0  Down, Depressed, Hopeless 0 - 0 0 0 0 0  PHQ - 2 Score 0 0 0 0 0 0 0   Review of Systems  Constitutional: Negative.   HENT: Negative.   Eyes: Negative.   Respiratory: Negative.   Cardiovascular: Negative.   Gastrointestinal: Negative.   Endocrine: Negative.   Genitourinary: Negative.   Musculoskeletal: Positive for arthralgias and neck pain.       Neck, hip, shoulders  Skin: Negative.   Allergic/Immunologic: Negative.   Neurological:       Facial pain, burning Neuropathic pain  Hematological: Negative.   Psychiatric/Behavioral: Negative.   All other systems reviewed and are negative.     Objective:   Physical Exam  Constitutional: No distress . Vital signs reviewed. HENT: Normocephalic.  Atraumatic. Eyes: EOMI. No discharge. Cardiovascular: No JVD.   Respiratory: Normal effort.  No stridor.   GI: Non-distended.   Skin: Warm and dry.  Intact. Psych: Normal mood.  Normal behavior. Musc: No edema in extremities.  No tenderness in extremities. Neuro: Alert Strength grossly WNL    Assessment & Plan:  Male with pmh of Vit D deficiency, OSA, migraines, low testosterone, HTN, presents with burning in face and shoulders.   1. 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  Lidoderm patch/ointment, Zyrtec, Tomapax, Almond milk, Elavil, Ketamine infusion (cound not tolerate), hypotherapy, compound medication without benefit Unable to trial carbamazepine as patient is on Eliquis Steroids made symptoms more severe Side effects with Cymbalta, oxcarbazepine, Pamelor Will consider PT for neck ROM and cervical traction and TENS Will consider referral to Psychology - CBT as rest and no activity seem to exacerbate the pain  Will consider Vascular/Rheum referral for Raynaud like symptoms ?Horner's like syndrome (delayed pupil relfex, history of ptosis s/p surgery) Will consider Sympathetic ganglion block/ESIs, however worse with oral steroids. Patient  states main 2 goals are travel and play with grandchildren, which he is able to do more             Continue 100, 100, 100 mg Lyrica, encouraged with food             Good benefit with capsaicin cream on shoulders, however, not using at present because of transient increase in pain Encouraged Mediterranean diet, believes his wife is following Cont Mag 277m daily Will consider trigger point injections Will consider Lidocaineinfusion Will consider Gabapentin at higher doses Will consider Botox injection Will consider nitropaste             Continuefollow up with hypotherapy,              Discussed with ID - small likelihood of benefit with referral to ID at [facility]             Will consider Yoga/medication             Discussed with  pharmacy - no available medications              Acupuncture too costly here, will inquire at another location in 8//2022  Trial Baclofen 5 TID  2. Sleep disturbance See #1             Continue CPAP             Improved  3. Vitamin B12 deficiency Level419on6/14,increasedsupplement, continue  4. Vitamin D Level34.4on6/14  Continue to8000 units daily  5. Acute on chronic Headaches No benefit with Topamax 25 qhs with exacerbation in burning             Essential oils with benefit             Recommend headache dairy  Improving

## 2020-07-09 NOTE — Addendum Note (Signed)
Addended by: Maryla Morrow A on: 07/09/2020 09:25 AM   Modules accepted: Orders

## 2020-08-25 ENCOUNTER — Encounter: Payer: Self-pay | Admitting: Physical Medicine & Rehabilitation

## 2020-08-25 ENCOUNTER — Encounter: Payer: Medicare PPO | Attending: Physical Medicine & Rehabilitation | Admitting: Physical Medicine & Rehabilitation

## 2020-08-25 ENCOUNTER — Other Ambulatory Visit: Payer: Self-pay

## 2020-08-25 VITALS — BP 110/66 | HR 55 | Temp 97.9°F | Ht 72.0 in | Wt 197.4 lb

## 2020-08-25 DIAGNOSIS — M792 Neuralgia and neuritis, unspecified: Secondary | ICD-10-CM | POA: Diagnosis present

## 2020-08-25 DIAGNOSIS — G501 Atypical facial pain: Secondary | ICD-10-CM | POA: Diagnosis present

## 2020-08-25 DIAGNOSIS — G479 Sleep disorder, unspecified: Secondary | ICD-10-CM | POA: Insufficient documentation

## 2020-08-25 MED ORDER — BACLOFEN 10 MG PO TABS: 5.0000 mg | ORAL_TABLET | Freq: Every day | ORAL | 2 refills | Status: DC

## 2020-08-25 NOTE — Addendum Note (Signed)
Addended by: Maryla Morrow A on: 08/25/2020 09:50 AM   Modules accepted: Orders

## 2020-08-25 NOTE — Progress Notes (Signed)
Subjective:    Patient ID: Evan Robinson, male    DOB: Mar 10, 1946, 75 y.o.   MRN: [id number]  HPI Male with pmh of Vit D deficiency, OSA, migraines, low testosterone, HTN, presents with burning in face and shoulders.   Initially stated: Started ~12/2016. He went to IM, Endo, Derm with negative workup. He had lab work for flushing and workup was negative. He saw Neurology, started on Cymbalta and Topomax without benefit. He had brain/C-spine MRI, which showed cervical stenosis. He saw Neurology and was told not radiculopathy related. Progressively getting worse.  Denies alleviating factors. Associated cold hands. Stationary positions exacerbate the pain.  Burning type pain. Radiates to all extremities at times. Denies associated weakness. Denies falls. Wants to travel and grandchildren. Pheochromocytoma workup negative. States he has tried everything. Carotid ultrasound negative per patient.   Last clinic visit on 07/09/2020.  Since that time, pt states he is being active.  He continues to take medications. Sleep has improved. Baclofen helps to some disagree, but makes him drowsy.   Pain Inventory Average Pain 7 Pain Right Now 7 My pain is intermittent and burning  In the last 24 hours, has pain interfered with the following? General activity 7 Relation with others 7 Enjoyment of life 7 What TIME of day is your pain at its worst? evening and night Sleep (in general) Fair  Pain is worse with: sitting and inactivity Pain improves with: rest Relief from Meds: only takes pain meds with headache which is not often  Family History  Problem Relation Age of Onset  . Alcohol abuse Father   . Cancer Father        prostate  . CAD Father        possible MI  . Alcohol abuse Paternal Grandfather   . Stroke Mother   . Diabetes Neg Hx    Social History   Socioeconomic History  . Marital status: Married    Spouse name: Not on file  . Number of children: Not on file  . Years of education:  Not on file  . Highest education level: Not on file  Occupational History  . Not on file  Tobacco Use  . Smoking status: Never Smoker  . Smokeless tobacco: Never Used  Vaping Use  . Vaping Use: Never used  Substance and Sexual Activity  . Alcohol use: No  . Drug use: No  . Sexual activity: Not on file  Other Topics Concern  . Not on file  Social History Narrative   "Gene"   Lives with wife   Occupation: retired, was Pharmacist, hospital   Activity: walks daily, some gym   Diet:    Social Determinants of Radio broadcast assistant Strain: Not on file  Food Insecurity: Not on file  Transportation Needs: Not on file  Physical Activity: Not on file  Stress: Not on file  Social Connections: Not on file   Past Surgical History:  Procedure Laterality Date  . CHOLECYSTECTOMY  2006  . COLONOSCOPY WITH PROPOFOL N/A 02/01/2017   Procedure: COLONOSCOPY WITH PROPOFOL;  Surgeon: Toledo, Benay Pike, MD;  Location: [facility]COPY;  Service: Gastroenterology;  Laterality: N/A;  . ESOPHAGOGASTRODUODENOSCOPY (EGD) WITH PROPOFOL N/A 02/01/2017   Procedure: ESOPHAGOGASTRODUODENOSCOPY (EGD) WITH PROPOFOL;  Surgeon: Toledo, Benay Pike, MD;  Location: [facility];  Service: Gastroenterology;  Laterality: N/A;  . HERNIA REPAIR  08/2010  . PROSTATECTOMY  2003   Past Surgical History:  Procedure Laterality Date  . CHOLECYSTECTOMY  2006  .  COLONOSCOPY WITH PROPOFOL N/A 02/01/2017   Procedure: COLONOSCOPY WITH PROPOFOL;  Surgeon: Toledo, Benay Pike, MD;  Location: [facility];  Service: Gastroenterology;  Laterality: N/A;  . ESOPHAGOGASTRODUODENOSCOPY (EGD) WITH PROPOFOL N/A 02/01/2017   Procedure: ESOPHAGOGASTRODUODENOSCOPY (EGD) WITH PROPOFOL;  Surgeon: Toledo, Benay Pike, MD;  Location: [facility];  Service: Gastroenterology;  Laterality: N/A;  . HERNIA REPAIR  08/2010  . PROSTATECTOMY  2003   Past Medical History:  Diagnosis Date  . Dyslipidemia   . H/O prostate cancer 2003   s/p radical  prostatectomy (uro Dr. Rush Farmer in [place] yearly)  . Headache 2012   thorough w/u - unrevealing.  has seen 3 neurologists, MRI, LP nonacute.  ESR = 3  . HTN (hypertension)   . Hypogonadism male   . Low testosterone   . Migraines   . OSA on CPAP   . 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 Vitamin D deficiency    BP 110/66   Pulse (!) 55   Temp 97.9 F (36.6 C)   Ht 6' (1.829 m)   Wt 197 lb 6.4 oz (89.5 kg)   SpO2 96%   BMI 26.77 kg/m   Opioid Risk Score:   Fall Risk Score:  `1  Depression screen PHQ 2/9  Depression screen [facility] 2/9 08/25/2020 06/09/2020 05/11/2020 01/30/2020 12/10/2019 04/04/2019 10/18/2018  Decreased Interest 0 0 0 0 0 0 0  Down, Depressed, Hopeless 0 0 - 0 0 0 0  PHQ - 2 Score 0 0 0 0 0 0 0   Review of Systems  Constitutional: Negative.   HENT: Negative.   Eyes: Negative.   Respiratory: Negative.   Cardiovascular: Negative.   Gastrointestinal: Negative.   Endocrine: Negative.   Genitourinary: Negative.   Musculoskeletal: Positive for arthralgias and neck pain.       Neck, hip, shoulders  Skin: Negative.   Allergic/Immunologic: Negative.   Neurological: Positive for headaches.       Facial pain, burning Neuropathic pain  Hematological: Negative.   Psychiatric/Behavioral: Negative.   All other systems reviewed and are negative.     Objective:   Physical Exam  Constitutional: No distress . Vital signs reviewed. HENT: Normocephalic.  Atraumatic. Eyes: EOMI. No discharge. Cardiovascular: No JVD.   Respiratory: Normal effort.  No stridor.   GI: Non-distended.   Skin: Warm and dry.  Intact. Psych: Normal mood.  Normal behavior. Musc: No edema in extremities.  No tenderness in extremities. Neuro: Alert Strength grossly WNL    Assessment & Plan:  Male with pmh of Vit D deficiency, OSA, migraines, low testosterone, HTN, presents with burning in face and shoulders.   1. Chronic facial pain with flushing  and burning and allodynia MRI from 10/2017 reviewed, revealing cervical cord compression. 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  Lidoderm patch/ointment, Zyrtec, Tomapax, Almond milk, Elavil, Ketamine infusion (cound not tolerate), hypotherapy, compound medication, hypnosis without benefit Unable to trial carbamazepine as patient is on Eliquis Steroids made symptoms more severe Side effects with Cymbalta, oxcarbazepine, Pamelor Will consider PT for neck ROM and cervical traction and TENS Will consider referral to Psychology - CBT as rest and no activity seem to exacerbate the pain  Will consider Vascular/Rheum referral for Raynaud like symptoms ?Horner's like syndrome (delayed pupil relfex, history of ptosis s/p surgery) Will consider Sympathetic ganglion block/ESIs, however  worse with oral steroids. Patient states main 2 goals are travel and play with grandchildren, which he is able to do more             Continue 100, 100, 100 mg Lyrica, encouraged with food             Good benefit with capsaicin cream on shoulders, however, not using at present because of transient increase in pain Encouraged Mediterranean diet, believes his wife is following  Continue Mag 223m daily Will consider trigger point injections Will consider Lidocaineinfusion Will consider Gabapentin at higher doses Will consider Botox injection Will consider nitropaste             Discussed with ID - small likelihood of benefit with referral to ID at [facility] center             Will consider Yoga/medication             Discussed with pharmacy - no available  medications              Acupuncture too costly here, will inquire at another location in 8//2022  Continue Baclofen 5 qhs (causes drowsiness)  Encouraged oral and topic application of turmeric   Overall improved  2. Sleep disturbance See #1             Continue CPAP             Improved  3. Vitamin B12 deficiency Level419on6/14,increasedsupplement, continue  4. Vitamin D Level34.4on6/14  Continue to8000 units daily  5. Acute on chronic Headaches No benefit with Topamax 25 qhs with exacerbation in burning             Essential oils with benefit             Recommend headache dairy  Improved

## 2020-09-18 ENCOUNTER — Other Ambulatory Visit: Payer: Self-pay | Admitting: Physical Medicine & Rehabilitation

## 2020-10-14 ENCOUNTER — Other Ambulatory Visit: Payer: Self-pay

## 2020-10-14 ENCOUNTER — Ambulatory Visit (INDEPENDENT_AMBULATORY_CARE_PROVIDER_SITE_OTHER): Payer: Medicare PPO | Admitting: Rehabilitative and Restorative Service Providers"

## 2020-10-14 DIAGNOSIS — R29818 Other symptoms and signs involving the nervous system: Secondary | ICD-10-CM

## 2020-10-14 DIAGNOSIS — R293 Abnormal posture: Secondary | ICD-10-CM

## 2020-10-14 DIAGNOSIS — R29898 Other symptoms and signs involving the musculoskeletal system: Secondary | ICD-10-CM | POA: Diagnosis not present

## 2020-10-14 NOTE — Patient Instructions (Signed)
Access Code: [email or web address] URL: [email or web address] Date: 10/14/2020 Prepared by: Margretta Ditty  Exercises Doorway Pec Stretch at 60 Elevation - 2 x daily - 7 x weekly - 1 sets - 3 reps - 20-30 seconds hold Doorway Pec Stretch at 120 Degrees Abduction - 2 x daily - 7 x weekly - 1 sets - 3 reps - 20-30 seconds hold Anatomical Position - 2 x daily - 7 x weekly - 1 sets - 10 reps - 3-5 seconds hold Supine Thoracic Mobilization Towel Roll Vertical with Arm Stretch - 2 x daily - 7 x weekly - 1 sets - 1 reps - 2 minutes hold Supine Chin Tucks on Flat Ball - 2 x daily - 7 x weekly - 1 sets - 5 reps - 3 seconds hold

## 2020-10-14 NOTE — Therapy (Signed)
[facility] [place], [place], [address] Phone: [phone]   Fax:  [phone]  Physical Therapy Evaluation  Patient Details  Name: Evan Robinson MRN: [id number] Date of Birth: February 23, 1946 Referring Provider (PT): Christianne Borrow, MD   Encounter Date: 10/14/2020   PT End of Session - 10/14/20 1013     Visit Number 1    Number of Visits 12    Date for PT Re-Evaluation 11/25/20    Authorization Type human[employer] medicare    PT Start Time [phone]    PT Stop Time 1016    PT Time Calculation (min) 40 min    Activity Tolerance Patient tolerated treatment well    Behavior During Therapy [facility] for tasks assessed/performed             Past Medical History:  Diagnosis Date   Dyslipidemia    H/O prostate cancer 2003   s/p radical prostatectomy (uro Dr. Rush Farmer in [place] yearly)   Headache 2012   thorough w/u - unrevealing.  has seen 3 neurologists, MRI, LP nonacute.  ESR = 3   HTN (hypertension)    Hypogonadism male    Low testosterone    Migraines    OSA on CPAP    Renal cyst 12/2010   complex cyst upper pole L kidney with no malignancy by MRI   Vitamin D deficiency     Past Surgical History:  Procedure Laterality Date   CHOLECYSTECTOMY  2006   COLONOSCOPY WITH PROPOFOL N/A 02/01/2017   Procedure: COLONOSCOPY WITH PROPOFOL;  Surgeon: Toledo, Benay Pike, MD;  Location: [facility];  Service: Gastroenterology;  Laterality: N/A;   ESOPHAGOGASTRODUODENOSCOPY (EGD) WITH PROPOFOL N/A 02/01/2017   Procedure: ESOPHAGOGASTRODUODENOSCOPY (EGD) WITH PROPOFOL;  Surgeon: Toledo, Benay Pike, MD;  Location: [facility];  Service: Gastroenterology;  Laterality: N/A;   HERNIA REPAIR  08/2010   PROSTATECTOMY  2003    There were no vitals filed for this visit.    Subjective Assessment - 10/14/20 0938     Subjective The patient notes insidious onset of shoulder and neck burning after a trip.  The sensation starts in bilat  shoulders into neck and into the face.  "If it's really bad, I can feel it in my arms".  He notes he saw a neurologist and had full work-up -- the only medicine he is taking is lyrica.    Pertinent History sleep apnea, used to have more frequent HA (saw neurologist in past), gets occasional burning in face "when symptoms spread up"    Patient Stated Goals improve neck ROM, see if therapy can release pressure to reduce burning    Currently in Pain? Yes    Pain Score --   always present   Pain Location Neck    Pain Orientation Right;Left    Pain Descriptors / Indicators Burning;Constant    Pain Type Chronic pain;Neuropathic pain    Pain Radiating Towards severity level varies-- worse late afternoon and night    Pain Onset More than a month ago    Pain Frequency Constant    Aggravating Factors  inactivity    Pain Relieving Factors movement                [facility] PT Assessment - 10/14/20 0948       Assessment   Medical Diagnosis Neuropathic pain    Referring Provider (PT) Christianne Borrow, MD    Onset Date/Surgical Date 10/12/20    Hand Dominance Right    Prior  Therapy none      Precautions   Precautions None      Restrictions   Weight Bearing Restrictions No      Balance Screen   Has the patient fallen in the past 6 months No    Has the patient had a decrease in activity level because of a fear of falling?  No    Is the patient reluctant to leave their home because of a fear of falling?  No      Home Environment   Living Environment Private residence      ROM / Strength   AROM / PROM / Strength AROM;PROM;Strength      AROM   Overall AROM  Deficits    AROM Assessment Site Cervical    Cervical Flexion 28    Cervical Extension 22   hard stop   Cervical - Right Side Bend 8    Cervical - Left Side Bend 8    Cervical - Right Rotation 55    Cervical - Left Rotation 48      PROM   Overall PROM Comments no pain with passive overpressure, but feels the tightness       Strength   Overall Strength Within functional limits for tasks performed    Overall Strength Comments 5/5 shoulder flexion/abduction and elbow flexion/extension      Palpation   Spinal mobility hypomobility is significant in thoracic and c-spine for CPA    Palpation comment shortening through bilat pec musculature, scalenes, SCM and trigger points in upper trap and levator                        Objective measurements completed on examination: See above findings.       [facility] Adult PT Treatment/Exercise - 10/14/20 1004       Exercises   Exercises Neck                    PT Education - 10/14/20 1012     Education Details HEP    Person(s) Educated Patient    Methods Explanation;Demonstration;Handout    Comprehension Verbalized understanding;Returned demonstration                 PT Long Term Goals - 10/14/20 1130       PT LONG TERM GOAL #1   Title The patient will be indep with HEP for postural stretching, postural stabilization.    Time 6    Period Weeks    Target Date 11/25/20      PT LONG TERM GOAL #2   Title The patient will report 50% reduction in frequency of burning sensation in bilateral shoulders/neck.    Time 6    Period Weeks    Target Date 11/25/20      PT LONG TERM GOAL #3   Title The patient will improve c-spine AROM for bilateral sidebending to > or equal to 18 degrees.    Baseline 8 degrees bilat    Time 6    Period Weeks    Target Date 11/25/20      PT LONG TERM GOAL #4   Title The patient will improve bilateral rotation c-spine to 60 degrees.    Baseline R 55, L is 48    Time 6    Period Weeks    Target Date 11/25/20      PT LONG TERM GOAL #5   Title The patient will improve neck extension to >  or equal to 30 degrees without c/o tightness.    Baseline 22 degrees with tightness    Time 6    Period Weeks    Target Date 11/25/20                    Plan - 10/14/20 1134     Clinical Impression  Statement The patient is a 75 year old male presenting to OP physical therapy with chronic h/o neuropathic bilateral superior shoulder pain that can radiate into neck and head at times.  He presents with limited cervical AROM, spinal hypomobility with spring test, abnormal posture, tightness in pec and cervical musculature and pain that occurs daily and is worse at night.    Personal Factors and Comorbidities Time since onset of injury/illness/exacerbation    Examination-Activity Limitations Bed Mobility;Reach Overhead;Lift;Sit    Examination-Participation Restrictions Driving    Stability/Clinical Decision Making Stable/Uncomplicated    Clinical Decision Making Low    Rehab Potential Good    PT Frequency 2x / week    PT Duration 6 weeks    PT Treatment/Interventions Taping;Patient/family education;ADLs/Self Care Home Management;Therapeutic exercise;Therapeutic activities;Neuromuscular re-education;Electrical Stimulation;Moist Heat;Traction;Dry needling;Manual techniques    PT Next Visit Plan check on initial HEP, progress postural lengthening and stabilization, consider DN and STM for pec and paraspinal c-spine musculature    PT Home Exercise Plan Access Code: [email or web address]    Consulted and Agree with Plan of Care Patient             Patient will benefit from skilled therapeutic intervention in order to improve the following deficits and impairments:  Hypomobility, Impaired flexibility, Postural dysfunction, Decreased range of motion, Increased fascial restricitons  Visit Diagnosis: Abnormal posture  Other symptoms and signs involving the musculoskeletal system  Other symptoms and signs involving the nervous system     Problem List Patient Active Problem List   Diagnosis Date Noted   Chronic tension-type headache, not intractable 11/14/2019   Trigeminal neuralgia syndrome 03/05/2019   Vitamin B 12 deficiency 08/13/2018   Sleep disturbance 07/13/2018   Neuropathic pain 06/20/2018    Facial pain, atypical 04/05/2018   Vitamin D deficiency    OSA on CPAP    HTN (hypertension)    Headache    Low testosterone    H/O prostate cancer    Dyslipidemia     Avya Flavell, PT 10/14/2020, 11:38 AM  [facility] [place], [place], [address] Phone: [phone]   Fax:  [phone]  Name: Evan Robinson MRN: [id number] Date of Birth: [birth year]

## 2020-10-19 ENCOUNTER — Other Ambulatory Visit: Payer: Self-pay

## 2020-10-19 ENCOUNTER — Ambulatory Visit (INDEPENDENT_AMBULATORY_CARE_PROVIDER_SITE_OTHER): Payer: Medicare PPO | Admitting: Rehabilitative and Restorative Service Providers"

## 2020-10-19 DIAGNOSIS — R293 Abnormal posture: Secondary | ICD-10-CM | POA: Diagnosis not present

## 2020-10-19 DIAGNOSIS — R29818 Other symptoms and signs involving the nervous system: Secondary | ICD-10-CM

## 2020-10-19 DIAGNOSIS — R29898 Other symptoms and signs involving the musculoskeletal system: Secondary | ICD-10-CM | POA: Diagnosis not present

## 2020-10-19 NOTE — Therapy (Signed)
[facility] [place], [place], [address] Phone: [phone]   Fax:  [phone]  Physical Therapy Treatment  Patient Details  Name: Evan Robinson MRN: [id number] Date of Birth: [birth year] Referring Provider (PT): Christianne Borrow, MD   Encounter Date: 10/19/2020   PT End of Session - 10/19/20 0720     Visit Number 2    Number of Visits 12    Date for PT Re-Evaluation 11/25/20    Authorization Type humana medicare    PT Start Time [phone]    PT Stop Time 0800    PT Time Calculation (min) 42 min    Activity Tolerance Patient tolerated treatment well    Behavior During Therapy [facility] for tasks assessed/performed             Past Medical History:  Diagnosis Date   Dyslipidemia    H/O prostate cancer 2003   s/p radical prostatectomy (uro Dr. Rush Farmer in [place] yearly)   Headache 2012   thorough w/u - unrevealing.  has seen 3 neurologists, MRI, LP nonacute.  ESR = 3   HTN (hypertension)    Hypogonadism male    Low testosterone    Migraines    OSA on CPAP    Renal cyst 12/2010   complex cyst upper pole L kidney with no malignancy by MRI   Vitamin D deficiency     Past Surgical History:  Procedure Laterality Date   CHOLECYSTECTOMY  2006   COLONOSCOPY WITH PROPOFOL N/A 02/01/2017   Procedure: COLONOSCOPY WITH PROPOFOL;  Surgeon: Toledo, Benay Pike, MD;  Location: [facility];  Service: Gastroenterology;  Laterality: N/A;   ESOPHAGOGASTRODUODENOSCOPY (EGD) WITH PROPOFOL N/A 02/01/2017   Procedure: ESOPHAGOGASTRODUODENOSCOPY (EGD) WITH PROPOFOL;  Surgeon: Toledo, Benay Pike, MD;  Location: [facility];  Service: Gastroenterology;  Laterality: N/A;   HERNIA REPAIR  08/2010   PROSTATECTOMY  2003    There were no vitals filed for this visit.   Subjective Assessment - 10/19/20 0718     Subjective The patient reports that the exercises make his neck sore.    Pertinent History sleep apnea, used to have more  frequent HA (saw neurologist in past), gets occasional burning in face "when symptoms spread up"    Patient Stated Goals improve neck ROM, see if therapy can release pressure to reduce burning    Currently in Pain? No/denies   was not as aware of pain due to being busy over the weekend               [facility] PT Assessment - 10/19/20 0721       Assessment   Medical Diagnosis Neuropathic pain    Referring Provider (PT) Christianne Borrow, MD    Onset Date/Surgical Date 10/12/20    Hand Dominance Right                           [facility] Adult PT Treatment/Exercise - 10/19/20 0721       Exercises   Exercises Neck;Shoulder      Neck Exercises: Machines for Strengthening   UBE (Upper Arm Bike) L3 x 2 minutes forward, 1.5 minutes backwards      Neck Exercises: Standing   Neck Retraction 10 reps    Neck Retraction Limitations into deflated ball      Neck Exercises: Seated   Other Seated Exercise upper trap stretching R and L      Neck Exercises: Prone  Axial Exension 10 reps    Other Prone Exercise attempted thoracic rotation opening R and L, unable due to mobility limitations      Shoulder Exercises: Standing   External Rotation Strengthening;Both;10 reps    External Rotation Limitations with noodle at spine and elbows bend to 90 degrees      Manual Therapy   Manual Therapy Joint mobilization;Soft tissue mobilization;Myofascial release    Manual therapy comments skilled palpation to assess response to STM and DN    Joint Mobilization CPA upper t-spine, downglides at CTJ grade II    Soft tissue mobilization paraspinal musculature/ c-spine, suboccipital release, upper trap, levator    Myofascial Release suboccipital      Neck Exercises: Stretches   Other Neck Stretches door frame stretch for pec opening              Trigger Point Dry Needling - 10/19/20 0900     Consent Given? Yes    Education Handout Provided Yes    Muscles Treated Head and Neck Upper  trapezius;Levator scapulae    Other Dry Needling bilateral    Upper Trapezius Response Twitch reponse elicited;Palpable increased muscle length    Levator Scapulae Response Palpable increased muscle length;Twitch response elicited                  PT Education - 10/19/20 0756     Education Details HEP    Person(s) Educated Patient    Methods Explanation;Demonstration;Handout    Comprehension Verbalized understanding;Returned demonstration                 PT Long Term Goals - 10/14/20 1130       PT LONG TERM GOAL #1   Title The patient will be indep with HEP for postural stretching, postural stabilization.    Time 6    Period Weeks    Target Date 11/25/20      PT LONG TERM GOAL #2   Title The patient will report 50% reduction in frequency of burning sensation in bilateral shoulders/neck.    Time 6    Period Weeks    Target Date 11/25/20      PT LONG TERM GOAL #3   Title The patient will improve c-spine AROM for bilateral sidebending to > or equal to 18 degrees.    Baseline 8 degrees bilat    Time 6    Period Weeks    Target Date 11/25/20      PT LONG TERM GOAL #4   Title The patient will improve bilateral rotation c-spine to 60 degrees.    Baseline R 55, L is 48    Time 6    Period Weeks    Target Date 11/25/20      PT LONG TERM GOAL #5   Title The patient will improve neck extension to > or equal to 30 degrees without c/o tightness.    Baseline 22 degrees with tightness    Time 6    Period Weeks    Target Date 11/25/20                   Plan - 10/19/20 0932     Clinical Impression Statement The patient responded well to joint mobilization and STM/DN for cervical musculature.  PT continuing to progress ther ex in clinic and for home to manage chronic tightness.    PT Treatment/Interventions Taping;Patient/family education;ADLs/Self Care Home Management;Therapeutic exercise;Therapeutic activities;Neuromuscular re-education;Electrical  Stimulation;Moist Heat;Traction;Dry needling;Manual techniques    PT Next  Visit Plan progress HEP, postural stabilization, DN/STM for pec and paraspinals/c-spine musculature    PT Home Exercise Plan Access Code: [email or web address]    [email or web address] and Agree with Plan of Care Patient             Patient will benefit from skilled therapeutic intervention in order to improve the following deficits and impairments:  Hypomobility, Impaired flexibility, Postural dysfunction, Decreased range of motion, Increased fascial restricitons  Visit Diagnosis: Abnormal posture  Other symptoms and signs involving the musculoskeletal system  Other symptoms and signs involving the nervous system     Problem List Patient Active Problem List   Diagnosis Date Noted   Chronic tension-type headache, not intractable 11/14/2019   Trigeminal neuralgia syndrome 03/05/2019   Vitamin B 12 deficiency 08/13/2018   Sleep disturbance 07/13/2018   Neuropathic pain 06/20/2018   Facial pain, atypical 04/05/2018   Vitamin D deficiency    OSA on CPAP    HTN (hypertension)    Headache    Low testosterone    H/O prostate cancer    Dyslipidemia     Armend Hochstatter, PT 10/19/2020, 1:02 PM  [facility] [place], [place], [address] Phone: [phone]   Fax:  [phone]  Name: Evan Robinson MRN: [id number] Date of Birth: 04/16/1946

## 2020-10-19 NOTE — Patient Instructions (Signed)
Access Code: [email or web address] URL: [email or web address] Date: 10/19/2020 Prepared by: Margretta Ditty  Exercises Doorway Pec Stretch at 60 Elevation - 2 x daily - 7 x weekly - 1 sets - 3 reps - 20-30 seconds hold Doorway Pec Stretch at 120 Degrees Abduction - 2 x daily - 7 x weekly - 1 sets - 3 reps - 20-30 seconds hold Anatomical Position - 2 x daily - 7 x weekly - 1 sets - 10 reps - 3-5 seconds hold Standing Upper Trapezius Stretch - 2 x daily - 7 x weekly - 1 sets - 3 reps - 30 seconds hold Supine Thoracic Mobilization Towel Roll Vertical with Arm Stretch - 2 x daily - 7 x weekly - 1 sets - 1 reps - 2 minutes hold Supine Chin Tucks on Flat Ball - 2 x daily - 7 x weekly - 1 sets - 5 reps - 3 seconds hold

## 2020-10-21 ENCOUNTER — Other Ambulatory Visit: Payer: Self-pay

## 2020-10-21 ENCOUNTER — Ambulatory Visit (INDEPENDENT_AMBULATORY_CARE_PROVIDER_SITE_OTHER): Payer: Medicare PPO | Admitting: Rehabilitative and Restorative Service Providers"

## 2020-10-21 DIAGNOSIS — R29898 Other symptoms and signs involving the musculoskeletal system: Secondary | ICD-10-CM

## 2020-10-21 DIAGNOSIS — R29818 Other symptoms and signs involving the nervous system: Secondary | ICD-10-CM | POA: Diagnosis not present

## 2020-10-21 DIAGNOSIS — R293 Abnormal posture: Secondary | ICD-10-CM

## 2020-10-21 NOTE — Therapy (Signed)
[place], [place], [address] Phone: [phone]   Fax:  [phone]  Physical Therapy Treatment  Patient Details  Name: Evan Robinson MRN: [id number] Date of Birth: [birth year] Referring Provider (PT): Christianne Borrow, MD   Encounter Date: 10/21/2020   PT End of Session - 10/21/20 0719     Visit Number 3    Number of Visits 12    Date for PT Re-Evaluation 11/25/20    Authorization Type humana medicare    PT Start Time 0717    PT Stop Time 0755    PT Time Calculation (min) 38 min    Activity Tolerance Patient tolerated treatment well    Behavior During Therapy [facility] for tasks assessed/performed             Past Medical History:  Diagnosis Date   Dyslipidemia    H/O prostate cancer 2003   s/p radical prostatectomy (uro Dr. Rush Farmer in [place] yearly)   Headache 2012   thorough w/u - unrevealing.  has seen 3 neurologists, MRI, LP nonacute.  ESR = 3   HTN (hypertension)    Hypogonadism male    Low testosterone    Migraines    OSA on CPAP    Renal cyst 12/2010   complex cyst upper pole L kidney with no malignancy by MRI   Vitamin D deficiency     Past Surgical History:  Procedure Laterality Date   CHOLECYSTECTOMY  2006   COLONOSCOPY WITH PROPOFOL N/A 02/01/2017   Procedure: COLONOSCOPY WITH PROPOFOL;  Surgeon: Toledo, Benay Pike, MD;  Location: [facility] ENDOSCOPY;  Service: Gastroenterology;  Laterality: N/A;   ESOPHAGOGASTRODUODENOSCOPY (EGD) WITH PROPOFOL N/A 02/01/2017   Procedure: ESOPHAGOGASTRODUODENOSCOPY (EGD) WITH PROPOFOL;  Surgeon: Toledo, Benay Pike, MD;  Location: [facility] ENDOSCOPY;  Service: Gastroenterology;  Laterality: N/A;   HERNIA REPAIR  08/2010   PROSTATECTOMY  2003    There were no vitals filed for this visit.   Subjective Assessment - 10/21/20 0718     Subjective The patient reports he had some soreness in upper traps after DN, but it has improved by today.    Pertinent  History sleep apnea, used to have more frequent HA (saw neurologist in past), gets occasional burning in face "when symptoms spread up"    Patient Stated Goals improve neck ROM, see if therapy can release pressure to reduce burning    Currently in Pain? No/denies                [facility] PT Assessment - 10/21/20 0720       Assessment   Medical Diagnosis Neuropathic pain    Referring Provider (PT) Christianne Borrow, MD    Onset Date/Surgical Date 10/12/20    Hand Dominance Right                           [facility] Adult PT Treatment/Exercise - 10/21/20 0720       Exercises   Exercises Neck;Shoulder;Other Exercises    Other Exercises  supine spinal twist with head and knees moving opposite direction      Neck Exercises: Machines for Strengthening   UBE (Upper Arm Bike) L4 x 2 minutes forward, 1 minute backward      Neck Exercises: Standing   Wall Push Ups 10 reps    Other Standing Exercises bow and arrow green band x 10 reps    Other Standing Exercises overhead lifting/press x 3 lbs R  and L sides x 12 reps      Neck Exercises: Supine   Neck Retraction 5 reps;5 secs    Cervical Rotation Right;Left;5 reps    Cervical Rotation Limitations with a towel roll at suboccipital region working on alignment    Other Supine Exercise L x 10 reps and W x 10 reps      Neck Exercises: Sidelying   Lateral Flexion Right;Left;5 reps    Other Sidelying Exercise sidelying open book for thoracic opening x 10 reps      Neck Exercises: Prone   Axial Exension 10 reps    W Back 10 reps    Other Prone Exercise shoulder flexion x 10 reps "superman"    Other Prone Exercise bilateral scap squeeze prone x 12 reps                    PT Education - 10/21/20 0754     Education Details HEP    Person(s) Educated Patient    Methods Explanation;Demonstration;Handout    Comprehension Verbalized understanding;Returned demonstration                 PT Long Term Goals -  10/14/20 1130       PT LONG TERM GOAL #1   Title The patient will be indep with HEP for postural stretching, postural stabilization.    Time 6    Period Weeks    Target Date 11/25/20      PT LONG TERM GOAL #2   Title The patient will report 50% reduction in frequency of burning sensation in bilateral shoulders/neck.    Time 6    Period Weeks    Target Date 11/25/20      PT LONG TERM GOAL #3   Title The patient will improve c-spine AROM for bilateral sidebending to > or equal to 18 degrees.    Baseline 8 degrees bilat    Time 6    Period Weeks    Target Date 11/25/20      PT LONG TERM GOAL #4   Title The patient will improve bilateral rotation c-spine to 60 degrees.    Baseline R 55, L is 48    Time 6    Period Weeks    Target Date 11/25/20      PT LONG TERM GOAL #5   Title The patient will improve neck extension to > or equal to 30 degrees without c/o tightness.    Baseline 22 degrees with tightness    Time 6    Period Weeks    Target Date 11/25/20                   Plan - 10/21/20 0736     Clinical Impression Statement The patient is tolerating progressive exercise for postural strengthening and alignment.  PT to progress to LTGs.    PT Treatment/Interventions Taping;Patient/family education;ADLs/Self Care Home Management;Therapeutic exercise;Therapeutic activities;Neuromuscular re-education;Electrical Stimulation;Moist Heat;Traction;Dry needling;Manual techniques    PT Next Visit Plan progress HEP, postural stabilization, DN/STM for pec and paraspinals/c-spine musculature    PT Home Exercise Plan Access Code: [email or web address]    Consulted and Agree with Plan of Care Patient             Patient will benefit from skilled therapeutic intervention in order to improve the following deficits and impairments:  Hypomobility, Impaired flexibility, Postural dysfunction, Decreased range of motion, Increased fascial restricitons  Visit Diagnosis: Abnormal  posture  Other symptoms and signs involving the  musculoskeletal system  Other symptoms and signs involving the nervous system     Problem List Patient Active Problem List   Diagnosis Date Noted   Chronic tension-type headache, not intractable 11/14/2019   Trigeminal neuralgia syndrome 03/05/2019   Vitamin B 12 deficiency 08/13/2018   Sleep disturbance 07/13/2018   Neuropathic pain 06/20/2018   Facial pain, atypical 04/05/2018   Vitamin D deficiency    OSA on CPAP    HTN (hypertension)    Headache    Low testosterone    H/O prostate cancer    Dyslipidemia     Clemma Johnsen, PT 10/21/2020, 7:56 AM  [facility] [place], [place], [address] Phone: [phone]   Fax:  [phone]  Name: Geraldo Robinson MRN: [id number] Date of Birth: [birth year]

## 2020-10-21 NOTE — Patient Instructions (Signed)
Access Code: [email or web address] URL: [email or web address] Date: 10/21/2020 Prepared by: Margretta Ditty  Exercises Doorway Pec Stretch at 60 Elevation - 2 x daily - 7 x weekly - 1 sets - 3 reps - 20-30 seconds hold Doorway Pec Stretch at 120 Degrees Abduction - 2 x daily - 7 x weekly - 1 sets - 3 reps - 20-30 seconds hold Anatomical Position - 2 x daily - 7 x weekly - 1 sets - 10 reps - 3-5 seconds hold Standing Upper Trapezius Stretch - 2 x daily - 7 x weekly - 1 sets - 3 reps - 30 seconds hold Supine Thoracic Mobilization Towel Roll Vertical with Arm Stretch - 2 x daily - 7 x weekly - 1 sets - 1 reps - 2 minutes hold Supine Chin Tucks on Flat Ball - 2 x daily - 7 x weekly - 1 sets - 5 reps - 3 seconds hold Prone W Scapular Retraction - 2 x daily - 7 x weekly - 1 sets - 10 reps Prone Single Shoulder Flexion - 2 x daily - 7 x weekly - 1 sets - 10 reps

## 2020-10-29 ENCOUNTER — Encounter: Payer: Medicare PPO | Admitting: Physical Medicine and Rehabilitation

## 2020-10-30 ENCOUNTER — Other Ambulatory Visit: Payer: Self-pay

## 2020-10-30 ENCOUNTER — Ambulatory Visit (INDEPENDENT_AMBULATORY_CARE_PROVIDER_SITE_OTHER): Payer: Medicare PPO | Admitting: Physical Therapy

## 2020-10-30 DIAGNOSIS — R29898 Other symptoms and signs involving the musculoskeletal system: Secondary | ICD-10-CM | POA: Diagnosis not present

## 2020-10-30 DIAGNOSIS — R293 Abnormal posture: Secondary | ICD-10-CM

## 2020-10-30 DIAGNOSIS — R29818 Other symptoms and signs involving the nervous system: Secondary | ICD-10-CM | POA: Diagnosis not present

## 2020-10-30 NOTE — Therapy (Signed)
[place], [place], [address] Phone: [phone]   Fax:  [phone]  Physical Therapy Treatment  Patient Details  Name: Evan Robinson MRN: [id number] Date of Birth: 03-10-1946 Referring Provider (PT): Christianne Borrow, MD   Encounter Date: 10/30/2020   PT End of Session - 10/30/20 0840     Visit Number 4    Number of Visits 12    Date for PT Re-Evaluation 11/25/20    Authorization Type humana medicare    Authorization - Visit Number 4    Progress Note Due on Visit 10    PT Start Time 0800    PT Stop Time 0843    PT Time Calculation (min) 43 min    Activity Tolerance Patient tolerated treatment well    Behavior During Therapy [facility] for tasks assessed/performed             Past Medical History:  Diagnosis Date   Dyslipidemia    H/O prostate cancer 2003   s/p radical prostatectomy (uro Dr. Rush Farmer in [place] yearly)   Headache 2012   thorough w/u - unrevealing.  has seen 3 neurologists, MRI, LP nonacute.  ESR = 3   HTN (hypertension)    Hypogonadism male    Low testosterone    Migraines    OSA on CPAP    Renal cyst 12/2010   complex cyst upper pole L kidney with no malignancy by MRI   Vitamin D deficiency     Past Surgical History:  Procedure Laterality Date   CHOLECYSTECTOMY  2006   COLONOSCOPY WITH PROPOFOL N/A 02/01/2017   Procedure: COLONOSCOPY WITH PROPOFOL;  Surgeon: Toledo, Benay Pike, MD;  Location: [facility];  Service: Gastroenterology;  Laterality: N/A;   ESOPHAGOGASTRODUODENOSCOPY (EGD) WITH PROPOFOL N/A 02/01/2017   Procedure: ESOPHAGOGASTRODUODENOSCOPY (EGD) WITH PROPOFOL;  Surgeon: Toledo, Benay Pike, MD;  Location: [facility];  Service: Gastroenterology;  Laterality: N/A;   HERNIA REPAIR  08/2010   PROSTATECTOMY  2003    There were no vitals filed for this visit.   Subjective Assessment - 10/30/20 0804     Subjective Pt states he feels dry needling helped  improve ROM. Still states he can't turn all the way to the side when driving    Patient Stated Goals improve neck ROM, see if therapy can release pressure to reduce burning    Currently in Pain? No/denies                [facility] PT Assessment - 10/30/20 0001       Assessment   Medical Diagnosis Neuropathic pain    Referring Provider (PT) Christianne Borrow, MD    Onset Date/Surgical Date 10/12/20    Hand Dominance Right      AROM   Cervical Extension 20    Cervical - Right Side Bend 30    Cervical - Left Side Bend 30                           [facility] Adult PT Treatment/Exercise - 10/30/20 0001       Neck Exercises: Machines for Strengthening   UBE (Upper Arm Bike) L4 x 2 minutes forward, 2 minutes backward      Neck Exercises: Standing   Wall Push Ups 10 reps    Other Standing Exercises bow and arrow green band x 10 reps    Other Standing Exercises overhead lifting/press x 3 lbs R and L sides  x 12 reps      Neck Exercises: Supine   Neck Retraction 5 reps;5 secs    Other Supine Exercise laying on noodle W x 10, horizontal abduction x 10      Neck Exercises: Sidelying   Other Sidelying Exercise sidelying open book for thoracic opening x 10 reps      Neck Exercises: Prone   W Back 10 reps    Other Prone Exercise bilateral scap squeeze prone x 12 reps      Manual Therapy   Manual Therapy Passive ROM    Joint Mobilization CPAs upper T spine grade 2-3    Soft tissue mobilization thoracic and cervical paraspinals, upper trap, levator, suboccipitals    Passive ROM PROM c spine side bending, rotation and extension      Neck Exercises: Stretches   Other Neck Stretches doorway stretch 3 positions                         PT Long Term Goals - 10/14/20 1130       PT LONG TERM GOAL #1   Title The patient will be indep with HEP for postural stretching, postural stabilization.    Time 6    Period Weeks    Target Date 11/25/20      PT LONG TERM  GOAL #2   Title The patient will report 50% reduction in frequency of burning sensation in bilateral shoulders/neck.    Time 6    Period Weeks    Target Date 11/25/20      PT LONG TERM GOAL #3   Title The patient will improve c-spine AROM for bilateral sidebending to > or equal to 18 degrees.    Baseline 8 degrees bilat    Time 6    Period Weeks    Target Date 11/25/20      PT LONG TERM GOAL #4   Title The patient will improve bilateral rotation c-spine to 60 degrees.    Baseline R 55, L is 48    Time 6    Period Weeks    Target Date 11/25/20      PT LONG TERM GOAL #5   Title The patient will improve neck extension to > or equal to 30 degrees without c/o tightness.    Baseline 22 degrees with tightness    Time 6    Period Weeks    Target Date 11/25/20                   Plan - 10/30/20 0841     Clinical Impression Statement Session focused on improving joint mobility and cervical and thoracic range of motion. Pt demos improved lateral cervical flexion since eval. Good tolerance to exercises and manual therapy    PT Next Visit Plan progress HEP, manual    PT Home Exercise Plan Access Code: [email or web address]    [email or web address] and Agree with Plan of Care Patient             Patient will benefit from skilled therapeutic intervention in order to improve the following deficits and impairments:     Visit Diagnosis: Abnormal posture  Other symptoms and signs involving the musculoskeletal system  Other symptoms and signs involving the nervous system     Problem List Patient Active Problem List   Diagnosis Date Noted   Chronic tension-type headache, not intractable 11/14/2019   Trigeminal neuralgia syndrome 03/05/2019   Vitamin B 12 deficiency 08/13/2018  Sleep disturbance 07/13/2018   Neuropathic pain 06/20/2018   Facial pain, atypical 04/05/2018   Vitamin D deficiency    OSA on CPAP    HTN (hypertension)    Headache    Low testosterone    H/O prostate cancer     Dyslipidemia    Isabelle Course, PT  Lonnette Shrode 10/30/2020, 8:44 AM  [facility] [place], [place], [address] Phone: [phone]   Fax:  [phone]  Name: Darreld Robinson MRN: [id number] Date of Birth: 1946/01/04

## 2020-11-03 ENCOUNTER — Ambulatory Visit (INDEPENDENT_AMBULATORY_CARE_PROVIDER_SITE_OTHER): Payer: Medicare PPO | Admitting: Rehabilitative and Restorative Service Providers"

## 2020-11-03 ENCOUNTER — Other Ambulatory Visit: Payer: Self-pay

## 2020-11-03 ENCOUNTER — Encounter: Payer: Self-pay | Admitting: Rehabilitative and Restorative Service Providers"

## 2020-11-03 DIAGNOSIS — R293 Abnormal posture: Secondary | ICD-10-CM

## 2020-11-03 DIAGNOSIS — R29818 Other symptoms and signs involving the nervous system: Secondary | ICD-10-CM | POA: Diagnosis not present

## 2020-11-03 DIAGNOSIS — R29898 Other symptoms and signs involving the musculoskeletal system: Secondary | ICD-10-CM

## 2020-11-03 NOTE — Therapy (Signed)
[place], [place], [address] Phone: [phone]   Fax:  [phone]  Physical Therapy Treatment  Patient Details  Name: Evan Robinson MRN: [id number] Date of Birth: November 23, 1945 Referring Provider (PT): Christianne Borrow, MD   Encounter Date: 11/03/2020   PT End of Session - 11/03/20 0839     Visit Number 5    Number of Visits 12    Date for PT Re-Evaluation 11/25/20    Authorization Type humana medicare    Authorization - Visit Number 5    Progress Note Due on Visit 10    PT Start Time 0804    PT Stop Time 0843    PT Time Calculation (min) 39 min    Activity Tolerance Patient tolerated treatment well    Behavior During Therapy [facility] for tasks assessed/performed             Past Medical History:  Diagnosis Date   Dyslipidemia    H/O prostate cancer 2003   s/p radical prostatectomy (uro Dr. Rush Farmer in [place] yearly)   Headache 2012   thorough w/u - unrevealing.  has seen 3 neurologists, MRI, LP nonacute.  ESR = 3   HTN (hypertension)    Hypogonadism male    Low testosterone    Migraines    OSA on CPAP    Renal cyst 12/2010   complex cyst upper pole L kidney with no malignancy by MRI   Vitamin D deficiency     Past Surgical History:  Procedure Laterality Date   CHOLECYSTECTOMY  2006   COLONOSCOPY WITH PROPOFOL N/A 02/01/2017   Procedure: COLONOSCOPY WITH PROPOFOL;  Surgeon: Toledo, Benay Pike, MD;  Location: [facility];  Service: Gastroenterology;  Laterality: N/A;   ESOPHAGOGASTRODUODENOSCOPY (EGD) WITH PROPOFOL N/A 02/01/2017   Procedure: ESOPHAGOGASTRODUODENOSCOPY (EGD) WITH PROPOFOL;  Surgeon: Toledo, Benay Pike, MD;  Location: [facility];  Service: Gastroenterology;  Laterality: N/A;   HERNIA REPAIR  08/2010   PROSTATECTOMY  2003    There were no vitals filed for this visit.   Subjective Assessment - 11/03/20 0805     Subjective The patient reports the burning sensation  hasn't been as intense lately.  He feels improved lateral bending, however no improvement in extension.    Pertinent History sleep apnea, used to have more frequent HA (saw neurologist in past), gets occasional burning in face "when symptoms spread up"    Patient Stated Goals improve neck ROM, see if therapy can release pressure to reduce burning    Currently in Pain? No/denies                [facility] PT Assessment - 11/03/20 0809       Assessment   Medical Diagnosis Neuropathic pain    Referring Provider (PT) Christianne Borrow, MD    Onset Date/Surgical Date 10/12/20    Hand Dominance Right      AROM   Overall AROM  Deficits    Cervical Flexion 28    Cervical Extension 18   24 degrees after dry needling and manual techniques                          [facility] Adult PT Treatment/Exercise - 11/03/20 0809       Exercises   Exercises Neck;Shoulder;Other Exercises      Neck Exercises: Supine   Cervical Isometrics Right rotation;Left rotation;3 secs    Neck Retraction 5 reps    Cervical  Rotation Both;5 reps    Cervical Rotation Limitations with chin tuck    Other Supine Exercise flexion/extension over towel roll      Neck Exercises: Prone   Axial Exension 10 reps    Axial Extension Limitations prone on elbows flexin/extension and axial extension    Neck Retraction 5 reps      Shoulder Exercises: Prone   Flexion Strengthening;Right;Left;5 reps    Flexion Limitations prone superman      Shoulder Exercises: Stretch   Corner Stretch Limitations door frame stretch with arms at 60 and 90      Manual Therapy   Manual Therapy Joint mobilization;Passive ROM;Soft tissue mobilization    Manual therapy comments to reduce stiffness and improve ROM/ skilled palpation to assess response to STM and DN    Joint Mobilization CPAs grade I mid and lower C-spine    Soft tissue mobilization cervidal paraspinals, upper trap, levator    Passive ROM PROM c spine side bending,  rotation and extension              Trigger Point Dry Needling - 11/03/20 0831     Consent Given? Yes    Education Handout Provided Previously provided    Muscles Treated Head and Neck Upper trapezius;Levator scapulae    Other Dry Needling bilateral    Upper Trapezius Response Twitch reponse elicited    Levator Scapulae Response Palpable increased muscle length                       PT Long Term Goals - 11/03/20 0840       PT LONG TERM GOAL #1   Title The patient will be indep with HEP for postural stretching, postural stabilization.    Time 6    Period Weeks    Status On-going      PT LONG TERM GOAL #2   Title The patient will report 50% reduction in frequency of burning sensation in bilateral shoulders/neck.    Baseline it fluctuates-- "it has not been intense like it has at times"    Time 6    Period Weeks    Status On-going      PT LONG TERM GOAL #3   Title The patient will improve c-spine AROM for bilateral sidebending to > or equal to 18 degrees.    Baseline 30 degrees bilatrally last visit    Time 6    Period Weeks    Status Achieved      PT LONG TERM GOAL #4   Title The patient will improve bilateral rotation c-spine to 60 degrees.    Baseline R 60, L 52    Time 6    Period Weeks      PT LONG TERM GOAL #5   Title The patient will improve neck extension to > or equal to 30 degrees without c/o tightness.    Baseline 22 degrees with tightness    Time 6    Period Weeks                   Plan - 11/03/20 1259     Clinical Impression Statement The patient is making progress towards LTGs for improved ROM.  He also notes dec'd intensity of burning sensation, but still notices intermittent symptoms.  Plan to continue to LTGs.    PT Treatment/Interventions Taping;Patient/family education;ADLs/Self Care Home Management;Therapeutic exercise;Therapeutic activities;Neuromuscular re-education;Electrical Stimulation;Moist Heat;Traction;Dry  needling;Manual techniques    PT Next Visit Plan progress HEP, manual  PT Home Exercise Plan Access Code: [email or web address]    [email or web address] and Agree with Plan of Care Patient             Patient will benefit from skilled therapeutic intervention in order to improve the following deficits and impairments:     Visit Diagnosis: Abnormal posture  Other symptoms and signs involving the musculoskeletal system  Other symptoms and signs involving the nervous system     Problem List Patient Active Problem List   Diagnosis Date Noted   Chronic tension-type headache, not intractable 11/14/2019   Trigeminal neuralgia syndrome 03/05/2019   Vitamin B 12 deficiency 08/13/2018   Sleep disturbance 07/13/2018   Neuropathic pain 06/20/2018   Facial pain, atypical 04/05/2018   Vitamin D deficiency    OSA on CPAP    HTN (hypertension)    Headache    Low testosterone    H/O prostate cancer    Dyslipidemia     Jovoni Borkenhagen, PT 11/03/2020, 1:01 PM  [facility] [place], [place], [address] Phone: [phone]   Fax:  [phone]  Name: Tivis Robinson MRN: [id number] Date of Birth: 1945-07-06

## 2020-11-05 ENCOUNTER — Encounter: Payer: Self-pay | Admitting: Physical Therapy

## 2020-11-05 ENCOUNTER — Other Ambulatory Visit: Payer: Self-pay

## 2020-11-05 ENCOUNTER — Ambulatory Visit (INDEPENDENT_AMBULATORY_CARE_PROVIDER_SITE_OTHER): Payer: Medicare PPO | Admitting: Physical Therapy

## 2020-11-05 DIAGNOSIS — R29898 Other symptoms and signs involving the musculoskeletal system: Secondary | ICD-10-CM

## 2020-11-05 DIAGNOSIS — R29818 Other symptoms and signs involving the nervous system: Secondary | ICD-10-CM | POA: Diagnosis not present

## 2020-11-05 DIAGNOSIS — R293 Abnormal posture: Secondary | ICD-10-CM | POA: Diagnosis not present

## 2020-11-05 NOTE — Patient Instructions (Signed)
Access Code: [email or web address] URL: [email or web address] Date: 11/05/2020 Prepared by: Raynelle Fanning  Exercises Doorway Pec Stretch at 60 Elevation - 2 x daily - 7 x weekly - 1 sets - 3 reps - 20-30 seconds hold Doorway Pec Stretch at 120 Degrees Abduction - 2 x daily - 7 x weekly - 1 sets - 3 reps - 20-30 seconds hold Anatomical Position - 2 x daily - 7 x weekly - 1 sets - 10 reps - 3-5 seconds hold Standing Upper Trapezius Stretch - 2 x daily - 7 x weekly - 1 sets - 3 reps - 30 seconds hold Supine Thoracic Mobilization Towel Roll Vertical with Arm Stretch - 2 x daily - 7 x weekly - 1 sets - 1 reps - 2 minutes hold Supine Chin Tucks on Flat Ball - 2 x daily - 7 x weekly - 1 sets - 5 reps - 3 seconds hold Hooklying Upper Neck Extension - 1 x daily - 7 x weekly - 1 sets - 3 reps - 20 seconds hold Prone W Scapular Retraction - 2 x daily - 7 x weekly - 1 sets - 10 reps Prone Single Shoulder Flexion - 2 x daily - 7 x weekly - 1 sets - 10 reps Seated Cervical Traction - 1 sets - 3 reps - 20 sec hold Seated Assisted Cervical Rotation with Towel - 1 x daily - 3 x weekly - 1 sets - 10 reps

## 2020-11-05 NOTE — Therapy (Signed)
[place], [place], [address] Phone: [phone]   Fax:  [phone]  Physical Therapy Treatment  Patient Details  Name: Evan Robinson MRN: [id number] Date of Birth: [birth year] Referring Provider (PT): Christianne Borrow, MD   Encounter Date: 11/05/2020   PT End of Session - 11/05/20 0801     Visit Number 6    Number of Visits 12    Date for PT Re-Evaluation 11/25/20    Authorization Type humana medicare    Progress Note Due on Visit 10    PT Start Time 0801    PT Stop Time 0845    PT Time Calculation (min) 44 min    Activity Tolerance Patient tolerated treatment well    Behavior During Therapy [facility] for tasks assessed/performed             Past Medical History:  Diagnosis Date   Dyslipidemia    H/O prostate cancer 2003   s/p radical prostatectomy (uro Dr. Rush Farmer in [place] yearly)   Headache 2012   thorough w/u - unrevealing.  has seen 3 neurologists, MRI, LP nonacute.  ESR = 3   HTN (hypertension)    Hypogonadism male    Low testosterone    Migraines    OSA on CPAP    Renal cyst 12/2010   complex cyst upper pole L kidney with no malignancy by MRI   Vitamin D deficiency     Past Surgical History:  Procedure Laterality Date   CHOLECYSTECTOMY  2006   COLONOSCOPY WITH PROPOFOL N/A 02/01/2017   Procedure: COLONOSCOPY WITH PROPOFOL;  Surgeon: Toledo, Benay Pike, MD;  Location: [facility] ENDOSCOPY;  Service: Gastroenterology;  Laterality: N/A;   ESOPHAGOGASTRODUODENOSCOPY (EGD) WITH PROPOFOL N/A 02/01/2017   Procedure: ESOPHAGOGASTRODUODENOSCOPY (EGD) WITH PROPOFOL;  Surgeon: Toledo, Benay Pike, MD;  Location: [facility] ENDOSCOPY;  Service: Gastroenterology;  Laterality: N/A;   HERNIA REPAIR  08/2010   PROSTATECTOMY  2003    There were no vitals filed for this visit.   Subjective Assessment - 11/05/20 0805     Subjective No pain. Looking up is still the hardest.    Pertinent History sleep apnea,  used to have more frequent HA (saw neurologist in past), gets occasional burning in face "when symptoms spread up"    Patient Stated Goals improve neck ROM, see if therapy can release pressure to reduce burning    Currently in Pain? No/denies                               [facility] Adult PT Treatment/Exercise - 11/05/20 0001       Exercises   Exercises Neck      Neck Exercises: Standing   Other Standing Exercises wide stance with hands on mat - thoracic rotation x 5 ea      Neck Exercises: Seated   Shoulder Flexion Both;10 reps    Shoulder Flexion Limitations in chair using ball and following with eyes/head    Upper Extremity D2 Flexion;Extension;10 reps    UE D2 Limitations diagonals with ball following with eyes/head    Other Seated Exercise seated rotation arms out x 5 ea way    Other Seated Exercise self traction with strap; 5 reps x 5-10 sec; rotation x 5 each way with strap      Neck Exercises: Prone   Neck Retraction 5 reps    Other Prone Exercise POE diagonals x 10 ea; mid  thoracic cat/cow x 5      Manual Therapy   Manual Therapy Joint mobilization;Soft tissue mobilization;Manual Traction    Manual therapy comments skilled palpation and and monitoring of soft tissues during DN    Joint Mobilization Gd III UPA to bil upper thoracic and cspine    Soft tissue mobilization bil UT and cspine    Manual Traction traction with rotation x 5 each way with marked improvement of active right rotation after              Trigger Point Dry Needling - 11/05/20 0001     Consent Given? Yes    Education Handout Provided Previously provided    Muscles Treated Head and Neck Oblique capitus;Upper trapezius;Splenius capitus;Cervical multifidi    Dry Needling Comments bil    Upper Trapezius Response Twitch reponse elicited;Palpable increased muscle length    Oblique Capitus Response Palpable increased muscle length   left   Splenius capitus Response Palpable increased  muscle length   left   Cervical multifidi Response Twitch reponse elicited;Palpable increased muscle length   focused more on left today                      PT Long Term Goals - 11/03/20 0840       PT LONG TERM GOAL #1   Title The patient will be indep with HEP for postural stretching, postural stabilization.    Time 6    Period Weeks    Status On-going      PT LONG TERM GOAL #2   Title The patient will report 50% reduction in frequency of burning sensation in bilateral shoulders/neck.    Baseline it fluctuates-- "it has not been intense like it has at times"    Time 6    Period Weeks    Status On-going      PT LONG TERM GOAL #3   Title The patient will improve c-spine AROM for bilateral sidebending to > or equal to 18 degrees.    Baseline 30 degrees bilatrally last visit    Time 6    Period Weeks    Status Achieved      PT LONG TERM GOAL #4   Title The patient will improve bilateral rotation c-spine to 60 degrees.    Baseline R 60, L 52    Time 6    Period Weeks      PT LONG TERM GOAL #5   Title The patient will improve neck extension to > or equal to 30 degrees without c/o tightness.    Baseline 22 degrees with tightness    Time 6    Period Weeks                   Plan - 11/05/20 1051     Clinical Impression Statement Patient continues to make progress with rotation, however extension still greatest limitation. He tolerated seated thoracic mobility very well today and would continue to benefit from this. He showed maked improvement in right cervical rotation following manual traction with rotation. He may benefit from a trial of mechanical traction next visit. We focused on DN along the left cervical spine today with positve responses and improved ease of rotation after.    PT Treatment/Interventions Taping;Patient/family education;ADLs/Self Care Home Management;Therapeutic exercise;Therapeutic activities;Neuromuscular re-education;Electrical  Stimulation;Moist Heat;Traction;Dry needling;Manual techniques    PT Next Visit Plan Trial of mechanical traction; review self manual traction, manual    PT Home Exercise Plan Access  Code: [email or web address]             Patient will benefit from skilled therapeutic intervention in order to improve the following deficits and impairments:  Hypomobility, Impaired flexibility, Postural dysfunction, Decreased range of motion, Increased fascial restricitons  Visit Diagnosis: Abnormal posture  Other symptoms and signs involving the musculoskeletal system  Other symptoms and signs involving the nervous system     Problem List Patient Active Problem List   Diagnosis Date Noted   Chronic tension-type headache, not intractable 11/14/2019   Trigeminal neuralgia syndrome 03/05/2019   Vitamin B 12 deficiency 08/13/2018   Sleep disturbance 07/13/2018   Neuropathic pain 06/20/2018   Facial pain, atypical 04/05/2018   Vitamin D deficiency    OSA on CPAP    HTN (hypertension)    Headache    Low testosterone    H/O prostate cancer    Dyslipidemia     Madelyn Flavors PT 11/05/2020, 10:59 AM  [facility], [place], [address] Phone: [phone]   Fax:  [phone]  Name: Evan Robinson MRN: [id number] Date of Birth: 26-Aug-1945

## 2020-11-06 ENCOUNTER — Encounter: Payer: Medicare PPO | Admitting: Registered Nurse

## 2020-11-09 ENCOUNTER — Ambulatory Visit (INDEPENDENT_AMBULATORY_CARE_PROVIDER_SITE_OTHER): Payer: Medicare PPO | Admitting: Rehabilitative and Restorative Service Providers"

## 2020-11-09 ENCOUNTER — Other Ambulatory Visit: Payer: Self-pay

## 2020-11-09 DIAGNOSIS — R293 Abnormal posture: Secondary | ICD-10-CM

## 2020-11-09 DIAGNOSIS — R29898 Other symptoms and signs involving the musculoskeletal system: Secondary | ICD-10-CM | POA: Diagnosis not present

## 2020-11-09 DIAGNOSIS — R29818 Other symptoms and signs involving the nervous system: Secondary | ICD-10-CM

## 2020-11-09 NOTE — Therapy (Signed)
[place], [place], [address] Phone: [phone]   Fax:  [phone]  Physical Therapy Treatment  Patient Details  Name: Evan Robinson MRN: [id number] Date of Birth: [birth year] Referring Provider (PT): Christianne Borrow, MD   Encounter Date: 11/09/2020   PT End of Session - 11/09/20 0719     Visit Number 7    Number of Visits 12    Date for PT Re-Evaluation 11/25/20    Authorization Type humana medicare    Authorization - Visit Number 7    Progress Note Due on Visit 10    PT Start Time 0717    PT Stop Time 0757    PT Time Calculation (min) 40 min    Activity Tolerance Patient tolerated treatment well    Behavior During Therapy [facility] for tasks assessed/performed             Past Medical History:  Diagnosis Date   Dyslipidemia    H/O prostate cancer 2003   s/p radical prostatectomy (uro Dr. Rush Farmer in [place] yearly)   Headache 2012   thorough w/u - unrevealing.  has seen 3 neurologists, MRI, LP nonacute.  ESR = 3   HTN (hypertension)    Hypogonadism male    Low testosterone    Migraines    OSA on CPAP    Renal cyst 12/2010   complex cyst upper pole L kidney with no malignancy by MRI   Vitamin D deficiency     Past Surgical History:  Procedure Laterality Date   CHOLECYSTECTOMY  2006   COLONOSCOPY WITH PROPOFOL N/A 02/01/2017   Procedure: COLONOSCOPY WITH PROPOFOL;  Surgeon: Toledo, Benay Pike, MD;  Location: [facility] END[facility];  Service: Gastroenterology;  Laterality: N/A;   ESOPHAGOGASTRODUODENOSCOPY (EGD) WITH PROPOFOL N/A 02/01/2017   Procedure: ESOPHAGOGASTRODUODENOSCOPY (EGD) WITH PROPOFOL;  Surgeon: Toledo, Benay Pike, MD;  Location: [facility] END[facility];  Service: Gastroenterology;  Laterality: N/A;   HERNIA REPAIR  08/2010   PROSTATECTOMY  2003    There were no vitals filed for this visit.   Subjective Assessment - 11/09/20 0719     Subjective The patient reports dec'd intensity of  symptoms of burning that is intermittent in nature.    Pertinent History sleep apnea, used to have more frequent HA (saw neurologist in past), gets occasional burning in face "when symptoms spread up"    Patient Stated Goals improve neck ROM, see if therapy can release pressure to reduce burning    Currently in Pain? No/denies                [facility] PT Assessment - 11/09/20 0720       Assessment   Medical Diagnosis Neuropathic pain    Referring Provider (PT) Christianne Borrow, MD    Onset Date/Surgical Date 10/12/20    Hand Dominance Right      AROM   Cervical Extension 25                           [facility] Adult PT Treatment/Exercise - 11/09/20 0720       Exercises   Exercises Neck      Neck Exercises: Standing   Other Standing Exercises trunk rotation with wall leaning      Neck Exercises: Seated   Shoulder Flexion 10 reps    Shoulder Flexion Limitations in chair using ball and following with eyes/head into flexion and extension    UE D2 Limitations diagonals with  ball following with eyes/head x 10 reps    Other Seated Exercise seated clavicle depression with neck extension for anterior neck stretch      Neck Exercises: Supine   Other Supine Exercise flexion/extension over a towel roll supine      Neck Exercises: Prone   Axial Exension 10 reps    Axial Extension Limitations prone on elbows flexin/extension and axial extension    Neck Retraction 5 reps    Neck Retraction Limitations added axial extension/retraction against yellow band anchored by hands in prone on elbow position    Other Prone Exercise trunk rotation quadriped R and L x 8 reps each    Other Prone Exercise prone on elbows diagonal head motion,      Shoulder Exercises: Standing   Extension Strengthening;Both;12 reps    Theraband Level (Shoulder Extension) Level 3 (Green)    Row Strengthening;12 reps    Theraband Level (Shoulder Row) Level 3 (Green)    Other Standing Exercises bow and  arrow strengthening green band  12 reps      Shoulder Exercises: Pulleys   Flexion 2 minutes    Scaption 2 minutes    Other Pulley Exercises in sitting with chair 1-78f from pulleys to encourage postural stretch                                      PT Long Term Goals - 11/03/20 0840       PT LONG TERM GOAL #1   Title The patient will be indep with HEP for postural stretching, postural stabilization.    Time 6    Period Weeks    Status On-going      PT LONG TERM GOAL #2   Title The patient will report 50% reduction in frequency of burning sensation in bilateral shoulders/neck.    Baseline it fluctuates-- "it has not been intense like it has at times"    Time 6    Period Weeks    Status On-going      PT LONG TERM GOAL #3   Title The patient will improve c-spine AROM for bilateral sidebending to > or equal to 18 degrees.    Baseline 30 degrees bilatrally last visit    Time 6    Period Weeks    Status Achieved      PT LONG TERM GOAL #4   Title The patient will improve bilateral rotation c-spine to 60 degrees.    Baseline R 60, L 52    Time 6    Period Weeks      PT LONG TERM GOAL #5   Title The patient will improve neck extension to > or equal to 30 degrees without c/o tightness.    Baseline 22 degrees with tightness    Time 6    Period Weeks                   Plan - 11/09/20 0754     Clinical Impression Statement The patient is continuing to make gains in neck extension with ther ex.  PT modified HEP slightly for more challenge at home.  [place]    PT Treatment/Interventions Taping;Patient/family education;ADLs/Self Care Home Management;Therapeutic exercise;Therapeutic activities;Neuromuscular re-education;Electrical Stimulation;Moist Heat;Traction;Dry needling;Manual techniques    PT Next Visit Plan consider Trial of mechanical traction; review self manual traction, manual; ther ex    PT Home Exercise  Plan Access  Code: [email or web address]    [email or web address] and Agree with Plan of Care Patient             Patient will benefit from skilled therapeutic intervention in order to improve the following deficits and impairments:     Visit Diagnosis: Abnormal posture  Other symptoms and signs involving the musculoskeletal system  Other symptoms and signs involving the nervous system     Problem List Patient Active Problem List   Diagnosis Date Noted   Chronic tension-type headache, not intractable 11/14/2019   Trigeminal neuralgia syndrome 03/05/2019   Vitamin B 12 deficiency 08/13/2018   Sleep disturbance 07/13/2018   Neuropathic pain 06/20/2018   Facial pain, atypical 04/05/2018   Vitamin D deficiency    OSA on CPAP    HTN (hypertension)    Headache    Low testosterone    H/O prostate cancer    Dyslipidemia     Ryshawn Sanzone, PT 11/09/2020, 3:39 PM  [facility] [place], [place], [address] Phone: [phone]   Fax:  [phone]  Name: Cass Robinson MRN: [id number] Date of Birth: 1945-10-17

## 2020-11-12 ENCOUNTER — Ambulatory Visit (INDEPENDENT_AMBULATORY_CARE_PROVIDER_SITE_OTHER): Payer: Medicare PPO | Admitting: Rehabilitative and Restorative Service Providers"

## 2020-11-12 ENCOUNTER — Other Ambulatory Visit: Payer: Self-pay

## 2020-11-12 DIAGNOSIS — R29898 Other symptoms and signs involving the musculoskeletal system: Secondary | ICD-10-CM | POA: Diagnosis not present

## 2020-11-12 DIAGNOSIS — R293 Abnormal posture: Secondary | ICD-10-CM | POA: Diagnosis not present

## 2020-11-12 DIAGNOSIS — R29818 Other symptoms and signs involving the nervous system: Secondary | ICD-10-CM

## 2020-11-12 NOTE — Therapy (Signed)
[place], [place], [address] Phone: [phone]   Fax:  [phone]  Physical Therapy Treatment  Patient Details  Name: Evan Robinson MRN: [id number] Date of Birth: 1945-05-27 Referring Provider (PT): Christianne Borrow, MD   Encounter Date: 11/12/2020   PT End of Session - 11/12/20 0805     Visit Number 8    Number of Visits 12    Date for PT Re-Evaluation 11/25/20    Authorization Type humana medicare    Authorization - Visit Number 8    Progress Note Due on Visit 10    PT Start Time 0803    PT Stop Time 0843    PT Time Calculation (min) 40 min    Activity Tolerance Patient tolerated treatment well    Behavior During Therapy [facility] for tasks assessed/performed             Past Medical History:  Diagnosis Date   Dyslipidemia    H/O prostate cancer 2003   s/p radical prostatectomy (uro Dr. Rush Farmer in [place] yearly)   Headache 2012   thorough w/u - unrevealing.  has seen 3 neurologists, MRI, LP nonacute.  ESR = 3   HTN (hypertension)    Hypogonadism male    Low testosterone    Migraines    OSA on CPAP    Renal cyst 12/2010   complex cyst upper pole L kidney with no malignancy by MRI   Vitamin D deficiency     Past Surgical History:  Procedure Laterality Date   CHOLECYSTECTOMY  2006   COLONOSCOPY WITH PROPOFOL N/A 02/01/2017   Procedure: COLONOSCOPY WITH PROPOFOL;  Surgeon: Toledo, Benay Pike, MD;  Location: [facility] END[facility];  Service: Gastroenterology;  Laterality: N/A;   ESOPHAGOGASTRODUODENOSCOPY (EGD) WITH PROPOFOL N/A 02/01/2017   Procedure: ESOPHAGOGASTRODUODENOSCOPY (EGD) WITH PROPOFOL;  Surgeon: Toledo, Benay Pike, MD;  Location: [facility] END[facility];  Service: Gastroenterology;  Laterality: N/A;   HERNIA REPAIR  08/2010   PROSTATECTOMY  2003    There were no vitals filed for this visit.   Subjective Assessment - 11/12/20 0805     Subjective The patient has no pain in the morning.     Pertinent History sleep apnea, used to have more frequent HA (saw neurologist in past), gets occasional burning in face "when symptoms spread up"    Patient Stated Goals improve neck ROM, see if therapy can release pressure to reduce burning    Currently in Pain? No/denies                [facility] PT Assessment - 11/12/20 0806       Assessment   Medical Diagnosis Neuropathic pain    Referring Provider (PT) Christianne Borrow, MD    Onset Date/Surgical Date 10/12/20    Hand Dominance Right                           [facility] Adult PT Treatment/Exercise - 11/12/20 0808       Exercises   Exercises Neck    Other Exercises  seated lumbar segmental mobility on disc working into flexion/extension      Neck Exercises: Machines for Strengthening   UBE (Upper Arm Bike) L4 x 2 minutes forward, 1 minute backward      Neck Exercises: Prone   Axial Extension Limitations with reaching    Other Prone Exercise prone shoulder flexion x 10 reps on elbows with cues for axial extension  Other Prone Exercise quadriped cat/camel x 10 reps, thread the needle in quadriped      Modalities   Modalities Traction      Traction   Type of Traction Cervical   trial of cervical traction today to assess response-- would home unit help manage chronic burning?   Min (lbs) 12    Max (lbs) 18    Hold Time 60    Rest Time 20    Time 10      Manual Therapy   Manual Therapy Joint mobilization    Manual therapy comments to improve mobility    Joint Mobilization thoracic Mid and upper- CPA grade II mobs      Neck Exercises: Stretches   Other Neck Stretches door frame stretch using a dowel to open up    Other Neck Stretches prone lateral weight shift on elbows; spinal twist R and L supine with arms in T x 2 reps x 30 seconds R and L                         PT Long Term Goals - 11/03/20 0840       PT LONG TERM GOAL #1   Title The patient will be indep with HEP for postural  stretching, postural stabilization.    Time 6    Period Weeks    Status On-going      PT LONG TERM GOAL #2   Title The patient will report 50% reduction in frequency of burning sensation in bilateral shoulders/neck.    Baseline it fluctuates-- "it has not been intense like it has at times"    Time 6    Period Weeks    Status On-going      PT LONG TERM GOAL #3   Title The patient will improve c-spine AROM for bilateral sidebending to > or equal to 18 degrees.    Baseline 30 degrees bilatrally last visit    Time 6    Period Weeks    Status Achieved      PT LONG TERM GOAL #4   Title The patient will improve bilateral rotation c-spine to 60 degrees.    Baseline R 60, L 52    Time 6    Period Weeks      PT LONG TERM GOAL #5   Title The patient will improve neck extension to > or equal to 30 degrees without c/o tightness.    Baseline 22 degrees with tightness    Time 6    Period Weeks                   Plan - 11/12/20 5277     Clinical Impression Statement The patient has difficulty isolating spinal flexion/extension in quadriped--moved to sitting.  Plan to continue to work to [place] anticipating 2 more weeks of PT.  Patient feels improvement in intensity of symptoms and frequency.    PT Treatment/Interventions Taping;Patient/family education;ADLs/Self Care Home Management;Therapeutic exercise;Therapeutic activities;Neuromuscular re-education;Electrical Stimulation;Moist Heat;Traction;Dry needling;Manual techniques    PT Next Visit Plan how was traction response; review self manual traction, manual; ther ex, spinal mobility    PT Home Exercise Plan Access Code: [email or web address]    Consulted and Agree with Plan of Care Patient             Patient will benefit from skilled therapeutic intervention in order to improve the following deficits and impairments:     Visit Diagnosis: Abnormal posture  Other  symptoms and signs involving the musculoskeletal system  Other symptoms  and signs involving the nervous system     Problem List Patient Active Problem List   Diagnosis Date Noted   Chronic tension-type headache, not intractable 11/14/2019   Trigeminal neuralgia syndrome 03/05/2019   Vitamin B 12 deficiency 08/13/2018   Sleep disturbance 07/13/2018   Neuropathic pain 06/20/2018   Facial pain, atypical 04/05/2018   Vitamin D deficiency    OSA on CPAP    HTN (hypertension)    Headache    Low testosterone    H/O prostate cancer    Dyslipidemia     Trinty Marken, PT 11/12/2020, 8:54 AM  [facility] [place], [place], [address] Phone: [phone]   Fax:  [phone]  Name: Evan Robinson MRN: [id number] Date of Birth: 03/01/1946

## 2020-11-17 ENCOUNTER — Other Ambulatory Visit: Payer: Self-pay

## 2020-11-17 ENCOUNTER — Ambulatory Visit (INDEPENDENT_AMBULATORY_CARE_PROVIDER_SITE_OTHER): Payer: Medicare PPO | Admitting: Rehabilitative and Restorative Service Providers"

## 2020-11-17 DIAGNOSIS — R29818 Other symptoms and signs involving the nervous system: Secondary | ICD-10-CM

## 2020-11-17 DIAGNOSIS — R29898 Other symptoms and signs involving the musculoskeletal system: Secondary | ICD-10-CM

## 2020-11-17 DIAGNOSIS — R293 Abnormal posture: Secondary | ICD-10-CM

## 2020-11-17 NOTE — Therapy (Signed)
[place], [place], [address] Phone: [phone]   Fax:  [phone]  Physical Therapy Treatment  Patient Details  Name: Evan Robinson MRN: [id number] Date of Birth: Dec 17, 1945 Referring Provider (PT): Christianne Borrow, MD   Encounter Date: 11/17/2020   PT End of Session - 11/17/20 0807     Visit Number 9    Number of Visits 12    Date for PT Re-Evaluation 11/25/20    Authorization Type humana medicare    Authorization - Visit Number 9    Progress Note Due on Visit 10    PT Start Time 0805    PT Stop Time 7342    PT Time Calculation (min) 50 min    Activity Tolerance Patient tolerated treatment well    Behavior During Therapy [facility] for tasks assessed/performed             Past Medical History:  Diagnosis Date   Dyslipidemia    H/O prostate cancer 2003   s/p radical prostatectomy (uro Dr. Rush Farmer in [place] yearly)   Headache 2012   thorough w/u - unrevealing.  has seen 3 neurologists, MRI, LP nonacute.  ESR = 3   HTN (hypertension)    Hypogonadism male    Low testosterone    Migraines    OSA on CPAP    Renal cyst 12/2010   complex cyst upper pole L kidney with no malignancy by MRI   Vitamin D deficiency     Past Surgical History:  Procedure Laterality Date   CHOLECYSTECTOMY  2006   COLONOSCOPY WITH PROPOFOL N/A 02/01/2017   Procedure: COLONOSCOPY WITH PROPOFOL;  Surgeon: Toledo, Benay Pike, MD;  Location: [facility] ENDOSCOPY;  Service: Gastroenterology;  Laterality: N/A;   ESOPHAGOGASTRODUODENOSCOPY (EGD) WITH PROPOFOL N/A 02/01/2017   Procedure: ESOPHAGOGASTRODUODENOSCOPY (EGD) WITH PROPOFOL;  Surgeon: Toledo, Benay Pike, MD;  Location: [facility] ENDOSCOPY;  Service: Gastroenterology;  Laterality: N/A;   HERNIA REPAIR  08/2010   PROSTATECTOMY  2003    There were no vitals filed for this visit.   Subjective Assessment - 11/17/20 0807     Subjective The patient hasn't had a bad "burning day".   He is doing his HEP regularly.    Pertinent History sleep apnea, used to have more frequent HA (saw neurologist in past), gets occasional burning in face "when symptoms spread up"    Patient Stated Goals improve neck ROM, see if therapy can release pressure to reduce burning    Currently in Pain? No/denies                [facility] PT Assessment - 11/17/20 0809       Assessment   Medical Diagnosis Neuropathic pain    Referring Provider (PT) Christianne Borrow, MD    Onset Date/Surgical Date 10/12/20    Hand Dominance Right      AROM   Overall AROM  Deficits    Overall AROM Comments measured before manual and traction    Cervical - Right Rotation 72    Cervical - Left Rotation 55                           [facility] Adult PT Treatment/Exercise - 11/17/20 0809       Exercises   Exercises Neck      Neck Exercises: Machines for Strengthening   UBE (Upper Arm Bike) L4 x 2 minutes forward, 1 minute backward  Neck Exercises: Seated   Other Seated Exercise seated neck flexion/extension with improvement in ROM when placing hands behind head to gain stretch    Other Seated Exercise improved neck extension when sitting with hands behind head and looking up to the ceiling.      Neck Exercises: Prone   Axial Exension 10 reps    Axial Extension Limitations prone elbow lateral rocking prior to neck extension    Other Prone Exercise quadriped shoulder horizontal abduction with red band x 12 reps    Other Prone Exercise quadriped cat/camel x 10 reps, thread the needle in quadriped      Traction   Type of Traction Cervical    Min (lbs) 13    Max (lbs) 20    Hold Time 60    Rest Time 20    Time 10      Manual Therapy   Manual Therapy Joint mobilization;Soft tissue mobilization;Myofascial release;Passive ROM    Manual therapy comments to improve mobility    Joint Mobilization supine c-spine PA mobs CPA grade II and downglides    Soft tissue mobilization scalenes bilat     Myofascial Release suboccipital    Passive ROM passive neck ROM supine for rotation and extension                         PT Long Term Goals - 11/17/20 4765       PT LONG TERM GOAL #1   Title The patient will be indep with HEP for postural stretching, postural stabilization.    Time 6    Period Weeks    Status On-going    Target Date 11/25/20      PT LONG TERM GOAL #2   Title The patient will report 50% reduction in frequency of burning sensation in bilateral shoulders/neck.    Baseline it fluctuates-- "it has not been intense like it has at times"    Time 6    Period Weeks    Status On-going      PT LONG TERM GOAL #3   Title The patient will improve c-spine AROM for bilateral sidebending to > or equal to 18 degrees.    Baseline 30 degrees bilatrally last visit    Time 6    Period Weeks    Status Achieved      PT LONG TERM GOAL #4   Title The patient will improve bilateral rotation c-spine to 60 degrees.    Baseline R 60, L 52    Time 6    Period Weeks      PT LONG TERM GOAL #5   Title The patient will improve neck extension to > or equal to 30 degrees without c/o tightness.    Baseline 22 degrees with tightness    Time 6    Period Weeks                   Plan - 11/17/20 1118     Clinical Impression Statement The patient has improved ROM neck to 25 degrees extension post traction.  PT continuing to work through end range ROM to have gains in functional movement.  Plan to wokr to LTGs.    PT Treatment/Interventions Taping;Patient/family education;ADLs/Self Care Home Management;Therapeutic exercise;Therapeutic activities;Neuromuscular re-education;Electrical Stimulation;Moist Heat;Traction;Dry needling;Manual techniques    PT Next Visit Plan how was traction response; review self manual traction, manual; ther ex, spinal mobility    PT Home Exercise Plan Access Code: [email or web address]  Consulted and Agree with Plan of Care Patient              Patient will benefit from skilled therapeutic intervention in order to improve the following deficits and impairments:     Visit Diagnosis: Abnormal posture  Other symptoms and signs involving the musculoskeletal system  Other symptoms and signs involving the nervous system     Problem List Patient Active Problem List   Diagnosis Date Noted   Chronic tension-type headache, not intractable 11/14/2019   Trigeminal neuralgia syndrome 03/05/2019   Vitamin B 12 deficiency 08/13/2018   Sleep disturbance 07/13/2018   Neuropathic pain 06/20/2018   Facial pain, atypical 04/05/2018   Vitamin D deficiency    OSA on CPAP    HTN (hypertension)    Headache    Low testosterone    H/O prostate cancer    Dyslipidemia     Tamir Wallman, PT 11/17/2020, 11:20 AM  [facility] [place]  [place], [place], [address] Phone: [phone]   Fax:  [phone]  Name: Evan Robinson MRN: [id number] Date of Birth: [birth year]

## 2020-11-20 ENCOUNTER — Ambulatory Visit (INDEPENDENT_AMBULATORY_CARE_PROVIDER_SITE_OTHER): Payer: Medicare PPO | Admitting: Physical Therapy

## 2020-11-20 ENCOUNTER — Other Ambulatory Visit: Payer: Self-pay

## 2020-11-20 DIAGNOSIS — R293 Abnormal posture: Secondary | ICD-10-CM

## 2020-11-20 DIAGNOSIS — R29898 Other symptoms and signs involving the musculoskeletal system: Secondary | ICD-10-CM | POA: Diagnosis not present

## 2020-11-20 DIAGNOSIS — R29818 Other symptoms and signs involving the nervous system: Secondary | ICD-10-CM | POA: Diagnosis not present

## 2020-11-20 NOTE — Therapy (Signed)
[place], [place], [address] Phone: [phone]   Fax:  [phone]  Physical Therapy Treatment and 10th visit note  Patient Details  Name: Evan Robinson MRN: [id number] Date of Birth: 1946-01-23 Referring Provider (PT): Christianne Borrow, MD  Encounter Date: 11/20/2020 Dates of service: 10/14/20-11/20/20  PT End of Session - 11/20/20 0833     Visit Number 10    Number of Visits 12    Date for PT Re-Evaluation 11/25/20    Authorization Type humana medicare    Authorization - Visit Number 10    Progress Note Due on Visit 10    PT Start Time 0800    PT Stop Time 0845    PT Time Calculation (min) 45 min    Activity Tolerance Patient tolerated treatment well    Behavior During Therapy [facility] for tasks assessed/performed             Past Medical History:  Diagnosis Date   Dyslipidemia    H/O prostate cancer 2003   s/p radical prostatectomy (uro Dr. Rush Farmer in [place] yearly)   Headache 2012   thorough w/u - unrevealing.  has seen 3 neurologists, MRI, LP nonacute.  ESR = 3   HTN (hypertension)    Hypogonadism male    Low testosterone    Migraines    OSA on CPAP    Renal cyst 12/2010   complex cyst upper pole L kidney with no malignancy by MRI   Vitamin D deficiency     Past Surgical History:  Procedure Laterality Date   CHOLECYSTECTOMY  2006   COLONOSCOPY WITH PROPOFOL N/A 02/01/2017   Procedure: COLONOSCOPY WITH PROPOFOL;  Surgeon: Toledo, Benay Pike, MD;  Location: [facility];  Service: Gastroenterology;  Laterality: N/A;   ESOPHAGOGASTRODUODENOSCOPY (EGD) WITH PROPOFOL N/A 02/01/2017   Procedure: ESOPHAGOGASTRODUODENOSCOPY (EGD) WITH PROPOFOL;  Surgeon: Toledo, Benay Pike, MD;  Location: [facility];  Service: Gastroenterology;  Laterality: N/A;   HERNIA REPAIR  08/2010   PROSTATECTOMY  2003    There were no vitals filed for this visit.   Subjective Assessment - 11/20/20 0801      Subjective Pt felt a lot of improvement after traction and is feeling less radicular symptoms    Patient Stated Goals improve neck ROM, see if therapy can release pressure to reduce burning    Currently in Pain? No/denies                [facility] PT Assessment - 11/20/20 0001       Assessment   Medical Diagnosis Neuropathic pain    Referring Provider (PT) Christianne Borrow, MD    Onset Date/Surgical Date 10/12/20    Hand Dominance Right                           [facility] Adult PT Treatment/Exercise - 11/20/20 0001       Neck Exercises: Machines for Strengthening   UBE (Upper Arm Bike) L4 x 4 min alt fwd/bkwd      Neck Exercises: Seated   Other Seated Exercise seated neck extension stretch 10 x 10 sec    Other Seated Exercise improved neck extension when sitting with hands behind head and looking up to the ceiling.      Neck Exercises: Prone   Axial Exension 15 reps    Axial Extension Limitations prone on elbows    Other Prone Exercise quadriped shoulder flexion x 10 bilat  Other Prone Exercise quadriped cat/camel x 10 reps, thread the needle in quadriped   max tactile cues for cat/camel     Traction   Type of Traction Cervical    Min (lbs) 13    Max (lbs) 20    Hold Time 60    Rest Time 20    Time 10      Manual Therapy   Manual therapy comments to improve mobility    Joint Mobilization prone PAs to upper thoracic, supine PAs to cervical spine to improve extension    Myofascial Release suboccipital    Passive ROM cervical spine all directions                         PT Long Term Goals - 11/17/20 4010       PT LONG TERM GOAL #1   Title The patient will be indep with HEP for postural stretching, postural stabilization.    Time 6    Period Weeks    Status On-going    Target Date 11/25/20      PT LONG TERM GOAL #2   Title The patient will report 50% reduction in frequency of burning sensation in bilateral shoulders/neck.    Baseline  it fluctuates-- "it has not been intense like it has at times"    Time 6    Period Weeks    Status On-going      PT LONG TERM GOAL #3   Title The patient will improve c-spine AROM for bilateral sidebending to > or equal to 18 degrees.    Baseline 30 degrees bilatrally last visit    Time 6    Period Weeks    Status Achieved      PT LONG TERM GOAL #4   Title The patient will improve bilateral rotation c-spine to 60 degrees.    Baseline R 60, L 52    Time 6    Period Weeks      PT LONG TERM GOAL #5   Title The patient will improve neck extension to > or equal to 30 degrees without c/o tightness.    Baseline 22 degrees with tightness    Time 6    Period Weeks                   Plan - 11/20/20 2725     Clinical Impression Statement Pt continues with reduced cervical ROM in all directions but is improving with manual work and traction. Pt continues to progress towards LTGs    PT Next Visit Plan recert, continue manual and traction as needed, update HEP    PT Home Exercise Plan Access Code: [email or web address]    Consulted and Agree with Plan of Care Patient             Patient will benefit from skilled therapeutic intervention in order to improve the following deficits and impairments:     Visit Diagnosis: Abnormal posture  Other symptoms and signs involving the musculoskeletal system  Other symptoms and signs involving the nervous system     Problem List Patient Active Problem List   Diagnosis Date Noted   Chronic tension-type headache, not intractable 11/14/2019   Trigeminal neuralgia syndrome 03/05/2019   Vitamin B 12 deficiency 08/13/2018   Sleep disturbance 07/13/2018   Neuropathic pain 06/20/2018   Facial pain, atypical 04/05/2018   Vitamin D deficiency    OSA on CPAP    HTN (hypertension)  Headache    Low testosterone    H/O prostate cancer    Dyslipidemia    Isabelle Course, PT  [facility] 11/20/2020, 8:36 AM  [facility] [place], [place], [address] Phone: [phone]   Fax:  [phone]  Name: Evan Robinson MRN: [id number] Date of Birth: 1945/05/12

## 2020-11-24 ENCOUNTER — Other Ambulatory Visit: Payer: Self-pay

## 2020-11-24 ENCOUNTER — Ambulatory Visit (INDEPENDENT_AMBULATORY_CARE_PROVIDER_SITE_OTHER): Payer: Medicare PPO | Admitting: Physical Therapy

## 2020-11-24 DIAGNOSIS — R29818 Other symptoms and signs involving the nervous system: Secondary | ICD-10-CM | POA: Diagnosis not present

## 2020-11-24 DIAGNOSIS — R29898 Other symptoms and signs involving the musculoskeletal system: Secondary | ICD-10-CM | POA: Diagnosis not present

## 2020-11-24 DIAGNOSIS — R293 Abnormal posture: Secondary | ICD-10-CM | POA: Diagnosis not present

## 2020-11-24 NOTE — Patient Instructions (Signed)
Access Code: [email or web address] URL: [email or web address] Date: 11/24/2020 Prepared by: Reggy [employer]  Exercises Standing Shoulder Row with Anchored Resistance - 2 x daily - 7 x weekly - 1 sets - 10 reps Shoulder extension with resistance - Neutral - 1 x daily - 7 x weekly - 3 sets - 10 reps Standing Shoulder Single Arm PNF D2 Flexion with Resistance - 1 x daily - 7 x weekly - 2 sets - 10 reps Drawing Bow - 1 x daily - 7 x weekly - 2 sets - 10 reps Supine Thoracic Mobilization Towel Roll Vertical with Arm Stretch - 2 x daily - 7 x weekly - 1 sets - 1 reps - 2 minutes hold Supine Chin Tucks on Flat Ball - 2 x daily - 7 x weekly - 1 sets - 5 reps - 3 seconds hold Prone Single Shoulder Flexion - 2 x daily - 7 x weekly - 1 sets - 10 reps Seated Thoracic Lumbar Extension with Pectoralis Stretch - 2 x daily - 7 x weekly - 1 sets - 10 reps Seated Assisted Cervical Rotation with Towel - 1 x daily - 3 x weekly - 1 sets - 10 reps

## 2020-11-24 NOTE — Therapy (Signed)
[place], [place], [address] Phone: [phone]   Fax:  [phone]  Physical Therapy Treatment and Recertification  Patient Details  Name: Evan Robinson MRN: [id number] Date of Birth: 09-Aug-1945 Referring Provider (PT): Christianne Borrow, MD   Encounter Date: 11/24/2020   PT End of Session - 11/24/20 0920     Visit Number 11    Number of Visits 23    Date for PT Re-Evaluation 01/05/21    Authorization Type humana medicare    Authorization - Visit Number 11    Progress Note Due on Visit 20    PT Start Time 0845    PT Stop Time 0930    PT Time Calculation (min) 45 min    Activity Tolerance Patient tolerated treatment well    Behavior During Therapy [facility] for tasks assessed/performed             Past Medical History:  Diagnosis Date   Dyslipidemia    H/O prostate cancer 2003   s/p radical prostatectomy (uro Dr. Rush Farmer in [place] yearly)   Headache 2012   thorough w/u - unrevealing.  has seen 3 neurologists, MRI, LP nonacute.  ESR = 3   HTN (hypertension)    Hypogonadism male    Low testosterone    Migraines    OSA on CPAP    Renal cyst 12/2010   complex cyst upper pole L kidney with no malignancy by MRI   Vitamin D deficiency     Past Surgical History:  Procedure Laterality Date   CHOLECYSTECTOMY  2006   COLONOSCOPY WITH PROPOFOL N/A 02/01/2017   Procedure: COLONOSCOPY WITH PROPOFOL;  Surgeon: Toledo, Benay Pike, MD;  Location: [facility];  Service: Gastroenterology;  Laterality: N/A;   ESOPHAGOGASTRODUODENOSCOPY (EGD) WITH PROPOFOL N/A 02/01/2017   Procedure: ESOPHAGOGASTRODUODENOSCOPY (EGD) WITH PROPOFOL;  Surgeon: Toledo, Benay Pike, MD;  Location: [facility];  Service: Gastroenterology;  Laterality: N/A;   HERNIA REPAIR  08/2010   PROSTATECTOMY  2003    There were no vitals filed for this visit.   Subjective Assessment - 11/24/20 0848     Subjective Pt states he is  feeling "better". He states the burning sensation is less intense but not less often    Patient Stated Goals improve neck ROM, see if therapy can release pressure to reduce burning    Currently in Pain? No/denies                [facility] PT Assessment - 11/24/20 0001       Assessment   Medical Diagnosis Neuropathic pain    Referring Provider (PT) Christianne Borrow, MD    Onset Date/Surgical Date 10/12/20    Hand Dominance Right      AROM   Cervical Flexion 42    Cervical Extension 23    Cervical - Right Side Bend 25    Cervical - Left Side Bend 25    Cervical - Right Rotation 72    Cervical - Left Rotation 55                           [facility] Adult PT Treatment/Exercise - 11/24/20 0001       Neck Exercises: Machines for Strengthening   UBE (Upper Arm Bike) L4 x 4 min alt fwd/bkwd      Neck Exercises: Standing   Upper Extremity D2 Flexion;10 reps    Theraband Level (UE D2) Level 2 (Red)  Other Standing Exercises doorway stretch at 60 and 120 2 x 20 sec each    Other Standing Exercises rows, bow and arrow and shouler extension all red TB all x 20 bilat      Neck Exercises: Seated   Other Seated Exercise seated neck extension stretch 10 x 10 sec    Other Seated Exercise improved neck extension when sitting with hands behind head and looking up to the ceiling.      Neck Exercises: Supine   Neck Retraction 15 reps;3 secs      Traction   Type of Traction Cervical    Min (lbs) 13    Max (lbs) 20    Hold Time 60    Rest Time 20    Time 10                    PT Education - 11/24/20 0911     Education Details updated HEP    Person(s) Educated Patient    Methods Explanation;Demonstration;Handout    Comprehension Returned demonstration;Verbalized understanding                 PT Long Term Goals - 11/24/20 0857       PT LONG TERM GOAL #1   Title The patient will be indep with HEP for postural stretching, postural stabilization.     Time 6    Period Weeks    Status On-going    Target Date 01/05/21      PT LONG TERM GOAL #2   Title The patient will report 50% reduction in frequency of burning sensation in bilateral shoulders/neck.    Baseline it fluctuates-- "it has not been intense like it has at times"    Time 6    Period Weeks    Status On-going    Target Date 01/05/21      PT LONG TERM GOAL #4   Title The patient will improve bilateral rotation c-spine to 60 degrees.    Baseline Rt 72, Lt 55    Time 6    Period Weeks    Status On-going    Target Date 01/05/21      PT LONG TERM GOAL #5   Title The patient will improve neck extension to > or equal to 30 degrees without c/o tightness.    Baseline 22 degrees with tightness    Time 6    Period Weeks    Status New    Target Date 01/05/21                   Plan - 11/24/20 1751     Clinical Impression Statement Pt has made good improvement with ROM and a decrease in intensity of radicular symptoms. Pt continues with postural impairments and deficits in strength and ROM and will benefit from continued PT to address deficits and improve mobility.    PT Frequency 2x / week    PT Duration 6 weeks    PT Treatment/Interventions Taping;Patient/family education;ADLs/Self Care Home Management;Therapeutic exercise;Therapeutic activities;Neuromuscular re-education;Electrical Stimulation;Moist Heat;Traction;Dry needling;Manual techniques;Cryotherapy;Iontophoresis 39m/ml Dexamethasone;Aquatic Therapy;Passive range of motion    PT Next Visit Plan progress postural strength and ROM    PT Home Exercise Plan Access Code: [email or web address]   Consulted and Agree with Plan of Care Patient             Patient will benefit from skilled therapeutic intervention in order to improve the following deficits and impairments:     Visit Diagnosis: Abnormal  posture - Plan: PT plan of care cert/re-cert  Other symptoms and signs involving the musculoskeletal system - Plan: PT  plan of care cert/re-cert  Other symptoms and signs involving the nervous system - Plan: PT plan of care cert/re-cert     Problem List Patient Active Problem List   Diagnosis Date Noted   Chronic tension-type headache, not intractable 11/14/2019   Trigeminal neuralgia syndrome 03/05/2019   Vitamin B 12 deficiency 08/13/2018   Sleep disturbance 07/13/2018   Neuropathic pain 06/20/2018   Facial pain, atypical 04/05/2018   Vitamin D deficiency    OSA on CPAP    HTN (hypertension)    Headache    Low testosterone    H/O prostate cancer    Dyslipidemia   Isabelle Course, PT   Radley Teston 11/24/2020, 9:26 AM  [facility] [place] [address] [place], [place], [address] Phone: [phone]   Fax:  [phone]  Name: Evan Robinson MRN: [id number] Date of Birth: August 27, 1945

## 2020-11-26 ENCOUNTER — Other Ambulatory Visit: Payer: Self-pay

## 2020-11-26 ENCOUNTER — Ambulatory Visit (INDEPENDENT_AMBULATORY_CARE_PROVIDER_SITE_OTHER): Payer: Medicare PPO | Admitting: Rehabilitative and Restorative Service Providers"

## 2020-11-26 DIAGNOSIS — R29818 Other symptoms and signs involving the nervous system: Secondary | ICD-10-CM | POA: Diagnosis not present

## 2020-11-26 DIAGNOSIS — R29898 Other symptoms and signs involving the musculoskeletal system: Secondary | ICD-10-CM

## 2020-11-26 DIAGNOSIS — R293 Abnormal posture: Secondary | ICD-10-CM | POA: Diagnosis not present

## 2020-11-26 NOTE — Therapy (Signed)
[place], [place], [address] Phone: [phone]   Fax:  [phone]  Physical Therapy Treatment  Patient Details  Name: Evan Robinson MRN: [id number] Date of Birth: 12-21-1945 Referring Provider (PT): Christianne Borrow, MD   Encounter Date: 11/26/2020   PT End of Session - 11/26/20 0805     Visit Number 12    Number of Visits 23    Date for PT Re-Evaluation 01/05/21    Authorization Type humana medicare    Authorization - Visit Number 12    Progress Note Due on Visit 20    PT Start Time 0803    PT Stop Time 0845    PT Time Calculation (min) 42 min    Activity Tolerance Patient tolerated treatment well    Behavior During Therapy [facility] for tasks assessed/performed             Past Medical History:  Diagnosis Date   Dyslipidemia    H/O prostate cancer 2003   s/p radical prostatectomy (uro Dr. Rush Farmer in [place] yearly)   Headache 2012   thorough w/u - unrevealing.  has seen 3 neurologists, MRI, LP nonacute.  ESR = 3   HTN (hypertension)    Hypogonadism male    Low testosterone    Migraines    OSA on CPAP    Renal cyst 12/2010   complex cyst upper pole L kidney with no malignancy by MRI   Vitamin D deficiency     Past Surgical History:  Procedure Laterality Date   CHOLECYSTECTOMY  2006   COLONOSCOPY WITH PROPOFOL N/A 02/01/2017   Procedure: COLONOSCOPY WITH PROPOFOL;  Surgeon: Toledo, Benay Pike, MD;  Location: [facility] ENDOSCOPY;  Service: Gastroenterology;  Laterality: N/A;   ESOPHAGOGASTRODUODENOSCOPY (EGD) WITH PROPOFOL N/A 02/01/2017   Procedure: ESOPHAGOGASTRODUODENOSCOPY (EGD) WITH PROPOFOL;  Surgeon: Toledo, Benay Pike, MD;  Location: [facility] ENDOSCOPY;  Service: Gastroenterology;  Laterality: N/A;   HERNIA REPAIR  08/2010   PROSTATECTOMY  2003    There were no vitals filed for this visit.   Subjective Assessment - 11/26/20 0805     Subjective The patient reports the burning sensation  continues to be milder.    Pertinent History sleep apnea, used to have more frequent HA (saw neurologist in past), gets occasional burning in face "when symptoms spread up"    Patient Stated Goals improve neck ROM, see if therapy can release pressure to reduce burning    Currently in Pain? No/denies                [facility] PT Assessment - 11/26/20 3570       Assessment   Medical Diagnosis Neuropathic pain    Referring Provider (PT) Christianne Borrow, MD    Onset Date/Surgical Date 10/12/20    Hand Dominance Right      AROM   Cervical Flexion 40    Cervical Extension 22    Cervical - Right Side Bend 24    Cervical - Left Side Bend 24    Cervical - Right Rotation 70    Cervical - Left Rotation 55                           [facility] Adult PT Treatment/Exercise - 11/26/20 0809       Exercises   Exercises Neck      Neck Exercises: Machines for Strengthening   UBE (Upper Arm Bike) L4 x 4 min alt fwd/bkwd  Neck Exercises: Standing   Neck Retraction 10 reps    Neck Retraction Limitations into deflated ball for tactile feedback    Wall Push Ups 10 reps    Other Standing Exercises rows x 15 reps green band with cues for postural upright    Other Standing Exercises doorway stretch; bow and arrow x 10 reps R and L with green band, anti-rotation x 10 reps R and L engaging core and shoulder/neck stabilizers      Traction   Type of Traction Cervical    Min (lbs) 13    Max (lbs) 20    Hold Time 60    Rest Time 20    Time 10      Manual Therapy   Manual Therapy Joint mobilization;Soft tissue mobilization    Manual therapy comments to improve mobility    Joint Mobilization supine CPA cervical spine grade III, lateral glide mid and low c-spine grade III, upglides/down glides C-spine mid and Low grade II-III, cervicothoracic junction downglides grade II-III supine    Soft tissue mobilization STM scalenes and multifidi    Myofascial Release suboccipital    Passive ROM  end range isometric holds; PROM with seated and supine into sidebending; end range cervical flexion + rotation                         PT Long Term Goals - 11/24/20 0857       PT LONG TERM GOAL #1   Title The patient will be indep with HEP for postural stretching, postural stabilization.    Time 6    Period Weeks    Status On-going    Target Date 01/05/21      PT LONG TERM GOAL #2   Title The patient will report 50% reduction in frequency of burning sensation in bilateral shoulders/neck.    Baseline it fluctuates-- "it has not been intense like it has at times"    Time 6    Period Weeks    Status On-going    Target Date 01/05/21      PT LONG TERM GOAL #4   Title The patient will improve bilateral rotation c-spine to 60 degrees.    Baseline Rt 72, Lt 55    Time 6    Period Weeks    Status On-going    Target Date 01/05/21      PT LONG TERM GOAL #5   Title The patient will improve neck extension to > or equal to 30 degrees without c/o tightness.    Baseline 22 degrees with tightness    Time 6    Period Weeks    Status New    Target Date 01/05/21                   Plan - 11/26/20 8366     Clinical Impression Statement The patient is continuing to note improvement in neck "burning" sensations that have been present x years.  He is gaining ROM with PT, and we are progressing posterior girdle strengthening for postural support.  The patient also notes improvement with use of cervical traction.  PT discussed availability of home c-traction units for ongoing relief of burning sensation post d/c from PT.  Plan to initiate process to check on coverage.    PT Frequency 2x / week    PT Duration 6 weeks    PT Treatment/Interventions Taping;Patient/family education;ADLs/Self Care Home Management;Therapeutic exercise;Therapeutic activities;Neuromuscular re-education;Electrical Stimulation;Moist Heat;Traction;Dry needling;Manual techniques;Cryotherapy;Iontophoresis  41m/ml Dexamethasone;Aquatic Therapy;Passive range of motion    PT Next Visit Plan progress manual for end range gains in ROM, isometrics end range, c-traction, posterior postural strengthening/ neck stabilization    PT Home Exercise Plan Access Code: [email or web address]   Consulted and Agree with Plan of Care Patient             Patient will benefit from skilled therapeutic intervention in order to improve the following deficits and impairments:  Hypomobility, Impaired flexibility, Postural dysfunction, Decreased range of motion, Increased fascial restricitons  Visit Diagnosis: Abnormal posture  Other symptoms and signs involving the musculoskeletal system  Other symptoms and signs involving the nervous system     Problem List Patient Active Problem List   Diagnosis Date Noted   Chronic tension-type headache, not intractable 11/14/2019   Trigeminal neuralgia syndrome 03/05/2019   Vitamin B 12 deficiency 08/13/2018   Sleep disturbance 07/13/2018   Neuropathic pain 06/20/2018   Facial pain, atypical 04/05/2018   Vitamin D deficiency    OSA on CPAP    HTN (hypertension)    Headache    Low testosterone    H/O prostate cancer    Dyslipidemia     Audy Dauphine, PT 11/26/2020, 8:40 AM  [facility] [address] [address]Phone: [phone]  Fax:  [phone] Name: Leonell JMartiniqueMRN: [id number] of Birth: 9June 23, 1947

## 2020-11-27 ENCOUNTER — Encounter: Payer: Medicare PPO | Admitting: Physical Therapy

## 2020-11-27 ENCOUNTER — Encounter: Payer: Medicare PPO | Attending: Registered Nurse | Admitting: Registered Nurse

## 2020-11-27 ENCOUNTER — Encounter: Payer: Self-pay | Admitting: Registered Nurse

## 2020-11-27 VITALS — BP 134/75 | HR 54 | Temp 98.3°F | Ht 72.0 in | Wt 198.0 lb

## 2020-11-27 DIAGNOSIS — M25511 Pain in right shoulder: Secondary | ICD-10-CM | POA: Diagnosis present

## 2020-11-27 DIAGNOSIS — M542 Cervicalgia: Secondary | ICD-10-CM | POA: Insufficient documentation

## 2020-11-27 DIAGNOSIS — G8929 Other chronic pain: Secondary | ICD-10-CM | POA: Diagnosis present

## 2020-11-27 DIAGNOSIS — G44229 Chronic tension-type headache, not intractable: Secondary | ICD-10-CM | POA: Insufficient documentation

## 2020-11-27 DIAGNOSIS — M792 Neuralgia and neuritis, unspecified: Secondary | ICD-10-CM | POA: Diagnosis present

## 2020-11-27 DIAGNOSIS — G501 Atypical facial pain: Secondary | ICD-10-CM | POA: Insufficient documentation

## 2020-11-27 DIAGNOSIS — M25512 Pain in left shoulder: Secondary | ICD-10-CM | POA: Diagnosis present

## 2020-11-27 DIAGNOSIS — G479 Sleep disorder, unspecified: Secondary | ICD-10-CM | POA: Insufficient documentation

## 2020-11-27 MED ORDER — PREGABALIN 100 MG PO CAPS: 100.0000 mg | ORAL_CAPSULE | Freq: Three times a day (TID) | ORAL | 3 refills | Status: DC

## 2020-11-27 MED ORDER — PREGABALIN 50 MG PO CAPS: ORAL_CAPSULE | ORAL | 3 refills | Status: DC

## 2020-11-27 NOTE — Progress Notes (Signed)
Subjective:    Patient ID: Evan Robinson, male    DOB: May 31, 1945, 75 y.o.   MRN: [id number]  HPI: Evan Robinson is a 75 y.o. male who has been seeing Dr Posey Pronto, he will be seen by this provider today and Dr Ranell Patrick in a few months per Dr Posey Pronto request.   He returns for follow up appointment for chronic pain and medication refill. He reports he has facial pain and neck pain radiating into his bilateral shoulders He rates his pain 7.His current exercise regime is walking and attending physical therapy two days a week.   Apical Pulse Checked HR: 60   Pain Inventory Average Pain 8 Pain Right Now 7 My pain is burning  In the last 24 hours, has pain interfered with the following? General activity 7 Relation with others 7 Enjoyment of life 7 What TIME of day is your pain at its worst? evening and night Sleep (in general) Fair  Pain is worse with: sitting Pain improves with: medication Relief from Meds: 4  Family History  Problem Relation Age of Onset   Alcohol abuse Father    Cancer Father        prostate   CAD Father        possible MI   Alcohol abuse Paternal Grandfather    Stroke Mother    Diabetes Neg Hx    Social History   Socioeconomic History   Marital status: Married    Spouse name: Not on file   Number of children: Not on file   Years of education: Not on file   Highest education level: Not on file  Occupational History   Not on file  Tobacco Use   Smoking status: Never   Smokeless tobacco: Never  Vaping Use   Vaping Use: Never used  Substance and Sexual Activity   Alcohol use: No   Drug use: No   Sexual activity: Not on file  Other Topics Concern   Not on file  Social History Narrative   "Gene"   Lives with wife   Occupation: retired, was Pharmacist, hospital   Activity: walks daily, some gym   Diet:    Social Determinants of Health   Financial Resource Strain: Not on file  Food Insecurity: Not on file  Transportation Needs: Not on file  Physical  Activity: Not on file  Stress: Not on file  Social Connections: Not on file   Past Surgical History:  Procedure Laterality Date   CHOLECYSTECTOMY  2006   COLONOSCOPY WITH PROPOFOL N/A 02/01/2017   Procedure: COLONOSCOPY WITH PROPOFOL;  Surgeon: Toledo, Benay Pike, MD;  Location: [facility];  Service: Gastroenterology;  Laterality: N/A;   ESOPHAGOGASTRODUODENOSCOPY (EGD) WITH PROPOFOL N/A 02/01/2017   Procedure: ESOPHAGOGASTRODUODENOSCOPY (EGD) WITH PROPOFOL;  Surgeon: Toledo, Benay Pike, MD;  Location: [facility];  Service: Gastroenterology;  Laterality: N/A;   HERNIA REPAIR  08/2010   PROSTATECTOMY  2003   Past Surgical History:  Procedure Laterality Date   CHOLECYSTECTOMY  2006   COLONOSCOPY WITH PROPOFOL N/A 02/01/2017   Procedure: COLONOSCOPY WITH PROPOFOL;  Surgeon: Toledo, Benay Pike, MD;  Location: [facility] END[facility];  Service: Gastroenterology;  Laterality: N/A;   ESOPHAGOGASTRODUODENOSCOPY (EGD) WITH PROPOFOL N/A 02/01/2017   Procedure: ESOPHAGOGASTRODUODENOSCOPY (EGD) WITH PROPOFOL;  Surgeon: Toledo, Benay Pike, MD;  Location: [facility];  Service: Gastroenterology;  Laterality: N/A;   HERNIA REPAIR  08/2010   PROSTATECTOMY  2003   Past Medical History:  Diagnosis Date   Dyslipidemia  H/O prostate cancer 2003   s/p radical prostatectomy (uro Dr. Rush Farmer in [place] yearly)   Headache 2012   thorough w/u - unrevealing.  has seen 3 neurologists, MRI, LP nonacute.  ESR = 3   HTN (hypertension)    Hypogonadism male    Low testosterone    Migraines    OSA on CPAP    Renal cyst 12/2010   complex cyst upper pole L kidney with no malignancy by MRI   Vitamin D deficiency    BP 134/75 (BP Location: Right Arm)   Pulse (!) 54   Temp 98.3 F (36.8 C) (Oral)   Ht 6' (1.829 m)   Wt 198 lb (89.8 kg)   SpO2 97%   BMI 26.85 kg/m   Opioid Risk Score:   Fall Risk Score:  `1  Depression screen PHQ 2/9  Depression screen [facility] 2/9 08/25/2020 06/09/2020 05/11/2020 01/30/2020  12/10/2019 04/04/2019 10/18/2018  Decreased Interest 0 0 0 0 0 0 0  Down, Depressed, Hopeless 0 0 - 0 0 0 0  PHQ - 2 Score 0 0 0 0 0 0 0      Review of Systems  Constitutional: Negative.   HENT: Negative.    Eyes: Negative.   Respiratory: Negative.    Cardiovascular: Negative.   Gastrointestinal: Negative.   Endocrine: Negative.   Musculoskeletal:  Positive for neck pain.       Nerve pain , pain in shoulders ,   Skin: Negative.   Allergic/Immunologic: Negative.   Neurological: Negative.   Hematological: Negative.   Psychiatric/Behavioral: Negative.        Objective:   Physical Exam Vitals and nursing note reviewed.  Constitutional:      Appearance: Normal appearance.  Cardiovascular:     Rate and Rhythm: Normal rate and regular rhythm.     Pulses: Normal pulses.     Heart sounds: Normal heart sounds.  Pulmonary:     Effort: Pulmonary effort is normal.     Breath sounds: Normal breath sounds.  Musculoskeletal:     Cervical back: Normal range of motion and neck supple.     Comments: Normal Muscle Bulk and Muscle Testing Reveals:  Upper Extremities: Full ROM and Muscle Strength  5/5 Lower Extremities: Full ROM and Muscle Strength 5/5 Arises from chair with ease Narrow Based  Gait     Skin:    General: Skin is warm and dry.  Neurological:     Mental Status: He is alert and oriented to person, place, and time.  Psychiatric:        Mood and Affect: Mood normal.        Behavior: Behavior normal.          Assessment & Plan:  Per Dr Posey Pronto Note:  1. 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             Lidoderm patch/ointment, Zyrtec, Tomapax, Almond milk, Elavil, Ketamine infusion  (cound not tolerate), hypotherapy, compound medication, hypnosis without benefit             Unable to trial carbamazepine as patient is on Eliquis  Steroids made symptoms more severe             Side effects with Cymbalta, oxcarbazepine, Pamelor             Continue Physical Therapy two days a week. Continue to monitor.             Continue Lyrica 100 mg TID and Lyrica 50 mg daily as needed for Break through Pain. Continue HEP as Tolerated and Continue to Monitor.  2, Chronic Pain Syndrome:  Continue Lyrica and  Baclofen 5 mg QHS. Continue to Monitor. No reports of drowsiness. Continue to monitor.   F/U with Dr Octavio Manns in 3 months

## 2020-12-02 ENCOUNTER — Other Ambulatory Visit: Payer: Self-pay

## 2020-12-02 ENCOUNTER — Ambulatory Visit (INDEPENDENT_AMBULATORY_CARE_PROVIDER_SITE_OTHER): Payer: Medicare PPO | Admitting: Rehabilitative and Restorative Service Providers"

## 2020-12-02 ENCOUNTER — Encounter: Payer: Self-pay | Admitting: Rehabilitative and Restorative Service Providers"

## 2020-12-02 DIAGNOSIS — R29818 Other symptoms and signs involving the nervous system: Secondary | ICD-10-CM | POA: Diagnosis not present

## 2020-12-02 DIAGNOSIS — R293 Abnormal posture: Secondary | ICD-10-CM | POA: Diagnosis not present

## 2020-12-02 DIAGNOSIS — R29898 Other symptoms and signs involving the musculoskeletal system: Secondary | ICD-10-CM

## 2020-12-02 NOTE — Therapy (Signed)
[place], [place], [address] Phone: [phone]   Fax:  [phone]  Physical Therapy Treatment and Discharge Summary  Patient Details  Name: Evan Robinson MRN: [id number] Date of Birth: [birth year] Referring Provider (PT): Christianne Borrow, MD  PHYSICAL THERAPY DISCHARGE SUMMARY  Visits from Start of Care: 13  Current functional level related to goals / functional outcomes: See goals below.   Remaining deficits: Intermittent pain in bilateral shoulders/ described as burning sensation   Education / Equipment: HEP, postural awareness, post d/c activities   Patient agrees to discharge. Patient goals were met. Patient is being discharged due to meeting the stated rehab goals.  Encounter Date: 12/02/2020   PT End of Session - 12/02/20 0805     Visit Number 13    Number of Visits 23    Date for PT Re-Evaluation 01/05/21    Authorization Type humana medicare    Authorization - Visit Number 13    Progress Note Due on Visit 20    PT Start Time 0802    PT Stop Time 0277    PT Time Calculation (min) 42 min    Activity Tolerance Patient tolerated treatment well    Behavior During Therapy [facility] for tasks assessed/performed             Past Medical History:  Diagnosis Date   Dyslipidemia    H/O prostate cancer 2003   s/p radical prostatectomy (uro Dr. Rush Farmer in [place] yearly)   Headache 2012   thorough w/u - unrevealing.  has seen 3 neurologists, MRI, LP nonacute.  ESR = 3   HTN (hypertension)    Hypogonadism male    Low testosterone    Migraines    OSA on CPAP    Renal cyst 12/2010   complex cyst upper pole L kidney with no malignancy by MRI   Vitamin D deficiency     Past Surgical History:  Procedure Laterality Date   CHOLECYSTECTOMY  2006   COLONOSCOPY WITH PROPOFOL N/A 02/01/2017   Procedure: COLONOSCOPY WITH PROPOFOL;  Surgeon: Toledo, Benay Pike, MD;  Location: [facility];   Service: Gastroenterology;  Laterality: N/A;   ESOPHAGOGASTRODUODENOSCOPY (EGD) WITH PROPOFOL N/A 02/01/2017   Procedure: ESOPHAGOGASTRODUODENOSCOPY (EGD) WITH PROPOFOL;  Surgeon: Toledo, Benay Pike, MD;  Location: [facility];  Service: Gastroenterology;  Laterality: N/A;   HERNIA REPAIR  08/2010   PROSTATECTOMY  2003    There were no vitals filed for this visit.   Subjective Assessment - 12/02/20 0804     Subjective The patient notes the company for [employer]/ cervical traction reached out to him.  They are checking insurance for coverage.    Pertinent History sleep apnea, used to have more frequent HA (saw neurologist in past), gets occasional burning in face "when symptoms spread up"    Patient Stated Goals improve neck ROM, see if therapy can release pressure to reduce burning    Currently in Pain? No/denies                [facility] PT Assessment - 12/02/20 4128       Assessment   Medical Diagnosis Neuropathic pain    Referring Provider (PT) Christianne Borrow, MD    Onset Date/Surgical Date 10/12/20    Hand Dominance Right      AROM   Cervical Extension 24    Cervical - Right Rotation 70    Cervical - Left Rotation 60  [facility] Adult PT Treatment/Exercise - 12/02/20 0808       Exercises   Exercises Neck      Neck Exercises: Machines for Strengthening   UBE (Upper Arm Bike) L4 x 4 min alt fwd/bkwd      Neck Exercises: Standing   Upper Extremity D2 Flexion;10 reps    Theraband Level (UE D2) Level 3 (Green)    Other Standing Exercises rows x 15 reps green band with cues for postural upright    Other Standing Exercises door way stretch, bow and arrow x 10 reps green band      Neck Exercises: Seated   Other Seated Exercise seated neck extension with thoracic extension      Neck Exercises: Supine   Neck Retraction 5 reps      Neck Exercises: Prone   Axial Exension 15 reps    Neck Retraction 5 reps    Other Prone Exercise  quadriped shoulder flexion x 10 bilat    Other Prone Exercise quadriped trunk rotation      Neck Exercises: Stretches   Other Neck Stretches thoracic extension seated in the chair      Manual Therapy   Manual Therapy Joint mobilization;Soft tissue mobilization;Myofascial release;Manual Traction    Manual therapy comments to improve mobility    Joint Mobilization supine CPA grade II-III    Soft tissue mobilization STM scalanes, suboccipitals, cervical paraspinals    Manual Traction traction with lateral sidebending                         PT Long Term Goals - 12/02/20 0806       PT LONG TERM GOAL #1   Title The patient will be indep with HEP for postural stretching, postural stabilization.    Time 6    Period Weeks    Status Achieved      PT LONG TERM GOAL #2   Title The patient will report 50% reduction in frequency of burning sensation in bilateral shoulders/neck.    Baseline it fluctuates, unable to rate.  "Overall, it's better, but it still has spikes"    Time 6    Period Weeks    Status Partially Met      PT LONG TERM GOAL #3   Title The patient will improve c-spine AROM for bilateral sidebending to > or equal to 18 degrees.    Baseline 30 degrees bilatrally    Time 6    Period Weeks    Status Achieved      PT LONG TERM GOAL #4   Title The patient will improve bilateral rotation c-spine to 60 degrees.    Baseline Rt 70, Lt 60    Time 6    Period Weeks    Status Achieved      PT LONG TERM GOAL #5   Title The patient will improve neck extension to > or equal to 30 degrees without c/o tightness.    Baseline 22 degrees with tightness    Time 6    Period Weeks    Status Not Met                   Plan - 12/02/20 0850     Clinical Impression Statement The patient has partially met LTGs. He continues with variable burning senation depending on amount of time he is in sitting position, or after shaving.  Patient has comprehensive HEP and has  imrpoved cervical ROM noted by patient   to help with driving tasks.    PT Treatment/Interventions Taping;Patient/family education;ADLs/Self Care Home Management;Therapeutic exercise;Therapeutic activities;Neuromuscular re-education;Electrical Stimulation;Moist Heat;Traction;Dry needling;Manual techniques;Cryotherapy;Aquatic Therapy;Passive range of motion    PT Next Visit Plan discharge today    PT Home Exercise Plan Access Code: [email or web address]    Consulted and Agree with Plan of Care Patient             Patient will benefit from skilled therapeutic intervention in order to improve the following deficits and impairments:     Visit Diagnosis: Abnormal posture  Other symptoms and signs involving the musculoskeletal system  Other symptoms and signs involving the nervous system     Problem List Patient Active Problem List   Diagnosis Date Noted   Chronic tension-type headache, not intractable 11/14/2019   Trigeminal neuralgia syndrome 03/05/2019   Vitamin B 12 deficiency 08/13/2018   Sleep disturbance 07/13/2018   Neuropathic pain 06/20/2018   Facial pain, atypical 04/05/2018   Vitamin D deficiency    OSA on CPAP    HTN (hypertension)    Headache    Low testosterone    H/O prostate cancer    Dyslipidemia    Thank you for the referral of this patient. Christina Weaver, MPT  WEAVER,CHRISTINA 12/02/2020, 1:01 PM  [facility] [address] [place], [place], [address] Phone: [phone]   Fax:  [phone]  Name: Jadyn Lunn MRN: [id number] Date of Birth: 03/07/1946    

## 2021-02-12 ENCOUNTER — Other Ambulatory Visit: Payer: Self-pay

## 2021-02-12 ENCOUNTER — Encounter: Payer: Self-pay | Admitting: Physical Medicine and Rehabilitation

## 2021-02-12 ENCOUNTER — Encounter: Payer: Medicare PPO | Attending: Registered Nurse | Admitting: Physical Medicine and Rehabilitation

## 2021-02-12 VITALS — BP 135/75 | HR 57 | Temp 97.9°F | Ht 72.0 in | Wt 193.6 lb

## 2021-02-12 DIAGNOSIS — M792 Neuralgia and neuritis, unspecified: Secondary | ICD-10-CM | POA: Diagnosis not present

## 2021-02-12 DIAGNOSIS — R2689 Other abnormalities of gait and mobility: Secondary | ICD-10-CM | POA: Diagnosis present

## 2021-02-12 DIAGNOSIS — G501 Atypical facial pain: Secondary | ICD-10-CM | POA: Diagnosis present

## 2021-02-12 NOTE — Progress Notes (Addendum)
Subjective:    Patient ID: Evan Robinson, male    DOB: 1945/05/05, 75 y.o.   MRN: [id number]  HPI: Evan Robinson is a 75 y.o. male who has been seeing Dr Posey Pronto, he will be seen by this provider today and Dr Ranell Patrick in a few months per Dr Posey Pronto request.    He returns for follow up appointment for chronic pain and medication refill. He reports he has facial pain and neck pain radiating into his bilateral shoulders He rates his pain 7.His current exercise regime is walking and attending physical therapy two days a week.   His pain has been stable since last visit He has burning in left sided facial pain and shoulder It is worst with friction and exacerbated with lying down.  He has been on the Lyrica for years.  He has never tried CBD oil.     Pain Inventory Average Pain 8 Pain Right Now 7 My pain is burning  In the last 24 hours, has pain interfered with the following? General activity 7 Relation with others 8 Enjoyment of life 7 What TIME of day is your pain at its worst? evening and night Sleep (in general) Fair  Pain is worse with: sitting Pain improves with: medication Relief from Meds: 4  Family History  Problem Relation Age of Onset   Alcohol abuse Father    Cancer Father        prostate   CAD Father        possible MI   Alcohol abuse Paternal Grandfather    Stroke Mother    Diabetes Neg Hx    Social History   Socioeconomic History   Marital status: Married    Spouse name: Not on file   Number of children: Not on file   Years of education: Not on file   Highest education level: Not on file  Occupational History   Not on file  Tobacco Use   Smoking status: Never   Smokeless tobacco: Never  Vaping Use   Vaping Use: Never used  Substance and Sexual Activity   Alcohol use: No   Drug use: No   Sexual activity: Not on file  Other Topics Concern   Not on file  Social History Narrative   "Gene"   Lives with wife   Occupation: retired, was Pharmacist, hospital    Activity: walks daily, some gym   Diet:    Social Determinants of Health   Financial Resource Strain: Not on file  Food Insecurity: Not on file  Transportation Needs: Not on file  Physical Activity: Not on file  Stress: Not on file  Social Connections: Not on file   Past Surgical History:  Procedure Laterality Date   CHOLECYSTECTOMY  2006   COLONOSCOPY WITH PROPOFOL N/A 02/01/2017   Procedure: COLONOSCOPY WITH PROPOFOL;  Surgeon: Toledo, Benay Pike, MD;  Location: [facility];  Service: Gastroenterology;  Laterality: N/A;   ESOPHAGOGASTRODUODENOSCOPY (EGD) WITH PROPOFOL N/A 02/01/2017   Procedure: ESOPHAGOGASTRODUODENOSCOPY (EGD) WITH PROPOFOL;  Surgeon: Toledo, Benay Pike, MD;  Location: [facility];  Service: Gastroenterology;  Laterality: N/A;   HERNIA REPAIR  08/2010   PROSTATECTOMY  2003   Past Surgical History:  Procedure Laterality Date   CHOLECYSTECTOMY  2006   COLONOSCOPY WITH PROPOFOL N/A 02/01/2017   Procedure: COLONOSCOPY WITH PROPOFOL;  Surgeon: Toledo, Benay Pike, MD;  Location: [facility];  Service: Gastroenterology;  Laterality: N/A;   ESOPHAGOGASTRODUODENOSCOPY (EGD) WITH PROPOFOL N/A 02/01/2017   Procedure: ESOPHAGOGASTRODUODENOSCOPY (EGD)  WITH PROPOFOL;  Surgeon: Toledo, Benay Pike, MD;  Location: [facility];  Service: Gastroenterology;  Laterality: N/A;   HERNIA REPAIR  08/2010   PROSTATECTOMY  2003   Past Medical History:  Diagnosis Date   Dyslipidemia    H/O prostate cancer 2003   s/p radical prostatectomy (uro Dr. Rush Farmer in [place] yearly)   Headache 2012   thorough w/u - unrevealing.  has seen 3 neurologists, MRI, LP nonacute.  ESR = 3   HTN (hypertension)    Hypogonadism male    Low testosterone    Migraines    OSA on CPAP    Renal cyst 12/2010   complex cyst upper pole L kidney with no malignancy by MRI   Vitamin D deficiency    BP 135/75   Pulse (!) 57   Temp 97.9 F (36.6 C)   Ht 6' (1.829 m)   Wt 193 lb 9.6 oz (87.8 kg)   BMI  26.26 kg/m   Opioid Risk Score:   Fall Risk Score:  `1  Depression screen PHQ 2/9  Depression screen [facility] 2/9 02/12/2021 11/27/2020 08/25/2020 06/09/2020 05/11/2020 01/30/2020 12/10/2019  Decreased Interest 0 0 0 0 0 0 0  Down, Depressed, Hopeless 0 0 0 0 - 0 0  PHQ - 2 Score 0 0 0 0 0 0 0      Review of Systems  Constitutional: Negative.   HENT: Negative.    Eyes: Negative.   Respiratory: Negative.    Cardiovascular: Negative.   Gastrointestinal: Negative.   Endocrine: Negative.   Musculoskeletal:  Positive for neck pain.       Nerve pain , pain in shoulders ,   Skin: Negative.   Allergic/Immunologic: Negative.   Neurological: Negative.   Hematological: Negative.   Psychiatric/Behavioral: Negative.        Objective:   Physical Exam Vitals and nursing note reviewed.  Constitutional:      Appearance: Normal appearance.  Cardiovascular:     Rate and Rhythm: Normal rate and regular rhythm.     Pulses: Normal pulses.     Heart sounds: Normal heart sounds.  Pulmonary:     Effort: Pulmonary effort is normal.     Breath sounds: Normal breath sounds.  Musculoskeletal:     Cervical back: Normal range of motion and neck supple.     Comments: Normal Muscle Bulk and Muscle Testing Reveals:  Upper Extremities: Full ROM and Muscle Strength  5/5 Lower Extremities: Full ROM and Muscle Strength 5/5 Arises from chair with ease Narrow Based  Gait  Tight cervical myofascia    Skin:    General: Skin is warm and dry.  Neurological:     Mental Status: He is alert and oriented to person, place, and time.  Psychiatric:        Mood and Affect: Mood normal.        Behavior: Behavior normal.         Assessment & Plan:  Per Dr Posey Pronto Note:  1. 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 MRI brain relatively unremarkable             NCS/EMG showing right C8 radiculopathy and left ulnar sensory  mononeuropathy, however, this does not account for all of patient's presenting complaints.             Lidoderm patch/ointment, Zyrtec, Tomapax, Almond milk, Elavil,  Ketamine infusion (cound not tolerate), hypotherapy, compound medication, hypnosis without benefit             Unable to trial carbamazepine as patient is on Eliquis             Steroids made symptoms more severe             Side effects with Cymbalta, oxcarbazepine, Pamelor             CompletedPhysical Therapy two days a week. Continue HEP             ContinueLyrica 100 mg TID and Lyrica 50 mg daily as needed for Break through Pain. Continue HEP as Tolerated and Continue to Monitor.   Recommend topical CBD oil- discussed its benefits in reducing inflammation, pain, insomnia, and anxiety.  -Discussed that CBD oil differs from marijuana in that it does not contain THC- the substance that causes euphoria.  -Discussed that it is made from the hemp plant.  -It has been used for thousands of years -preliminary research suggests that if may be able to shrink cancerous tumors, stop plaque formation in Alzheimer's Disease, and slow the progress of brain disease from concussions.  -Additional benefits that have been demonstrated in studies include improved nausea, indigestion, and brain health, and reduced seizures.  -In a survey 92% of patients who tried medical cannabis felt it improved symptoms such as chronic pain, arthritis, migraines, and cancer.    -recommended certain CBD brand  -Discussed Qutenza as an option for neuropathic pain control. Discussed that this is a capsaicin patch, stronger than capsaicin cream. Discussed that it is currently approved for diabetic peripheral neuropathy and post-herpetic neuralgia, but that it has also shown benefit in treating other forms of neuropathy. Provided patient with link to site to learn more about the patch: [employer]. Discussed that the patch would be placed in office and benefits  usually last 3 months. Discussed that unintended exposure to capsaicin can cause severe irritation of eyes, mucous membranes, respiratory tract, and skin, but that Qutenza is a local treatment and does not have the systemic side effects of other nerve medications. Discussed that there may be pain, itching, erythema, and decreased sensory function associated with the application of Qutenza. Side effects usually subside within 1 week. A cold pack of analgesic medications can help with these side effects. Blood pressure can also be increased due to pain associated with administration of the patch.  2, Chronic Pain Syndrome:  Continue Lyrica and  Baclofen 5 mg QHS. Continue to Monitor. No reports of drowsiness. Continue to monitor.  3. Impaired balance: affected by Lyrica. Dicussed decreasing dose to 50/100/100  F/u w/ me in 4 months

## 2021-02-12 NOTE — Patient Instructions (Signed)
[facility]  [place] next to [employer]  Recommend topical CBD oil- discussed its benefits in reducing inflammation, pain, insomnia, and anxiety.  -Discussed that CBD oil differs from marijuana in that it does not contain THC- the substance that causes euphoria.  -Discussed that it is made from the hemp plant.  -It has been used for thousands of years -preliminary research suggests that if may be able to shrink cancerous tumors, stop plaque formation in Alzheimer's Disease, and slow the progress of brain disease from concussions.  -Additional benefits that have been demonstrated in studies include improved nausea, indigestion, and brain health, and reduced seizures.  -In a survey 92% of patients who tried medical cannabis felt it improved symptoms such as chronic pain, arthritis, migraines, and cancer.

## 2021-03-21 ENCOUNTER — Other Ambulatory Visit: Payer: Self-pay

## 2021-03-21 ENCOUNTER — Encounter (HOSPITAL_BASED_OUTPATIENT_CLINIC_OR_DEPARTMENT_OTHER): Payer: Self-pay

## 2021-03-21 ENCOUNTER — Emergency Department (HOSPITAL_BASED_OUTPATIENT_CLINIC_OR_DEPARTMENT_OTHER)
Admission: EM | Admit: 2021-03-21 | Discharge: 2021-03-21 | Disposition: A | Discharge status: Home / Self Care | Payer: Medicare PPO | Attending: Emergency Medicine | Admitting: Emergency Medicine

## 2021-03-21 ENCOUNTER — Emergency Department (HOSPITAL_BASED_OUTPATIENT_CLINIC_OR_DEPARTMENT_OTHER): Payer: Medicare PPO | Admitting: Radiology

## 2021-03-21 DIAGNOSIS — I10 Essential (primary) hypertension: Secondary | ICD-10-CM | POA: Insufficient documentation

## 2021-03-21 DIAGNOSIS — Z79899 Other long term (current) drug therapy: Secondary | ICD-10-CM | POA: Insufficient documentation

## 2021-03-21 DIAGNOSIS — Z8546 Personal history of malignant neoplasm of prostate: Secondary | ICD-10-CM | POA: Diagnosis not present

## 2021-03-21 DIAGNOSIS — I4891 Unspecified atrial fibrillation: Secondary | ICD-10-CM | POA: Diagnosis not present

## 2021-03-21 DIAGNOSIS — R5383 Other fatigue: Secondary | ICD-10-CM | POA: Diagnosis not present

## 2021-03-21 DIAGNOSIS — R002 Palpitations: Secondary | ICD-10-CM | POA: Diagnosis present

## 2021-03-21 DIAGNOSIS — Z7901 Long term (current) use of anticoagulants: Secondary | ICD-10-CM | POA: Insufficient documentation

## 2021-03-21 DIAGNOSIS — R42 Dizziness and giddiness: Secondary | ICD-10-CM | POA: Insufficient documentation

## 2021-03-21 DIAGNOSIS — R0600 Dyspnea, unspecified: Secondary | ICD-10-CM | POA: Diagnosis not present

## 2021-03-21 DIAGNOSIS — I480 Paroxysmal atrial fibrillation: Secondary | ICD-10-CM

## 2021-03-21 DIAGNOSIS — Z20822 Contact with and (suspected) exposure to covid-19: Secondary | ICD-10-CM | POA: Insufficient documentation

## 2021-03-21 LAB — CBC WITH DIFFERENTIAL/PLATELET
Abs Immature Granulocytes: 0.05 10*3/uL (ref 0.00–0.07)
Basophils Absolute: 0 10*3/uL (ref 0.0–0.1)
Basophils Relative: 0 %
Eosinophils Absolute: 0.1 10*3/uL (ref 0.0–0.5)
Eosinophils Relative: 2 %
HCT: 40 % (ref 39.0–52.0)
Hemoglobin: 14 g/dL (ref 13.0–17.0)
Immature Granulocytes: 1 %
Lymphocytes Relative: 17 %
Lymphs Abs: 0.8 10*3/uL (ref 0.7–4.0)
MCH: 30.8 pg (ref 26.0–34.0)
MCHC: 35 g/dL (ref 30.0–36.0)
MCV: 88.1 fL (ref 80.0–100.0)
Monocytes Absolute: 0.4 10*3/uL (ref 0.1–1.0)
Monocytes Relative: 8 %
Neutro Abs: 3.3 10*3/uL (ref 1.7–7.7)
Neutrophils Relative %: 72 %
Platelets: 154 10*3/uL (ref 150–400)
RBC: 4.54 MIL/uL (ref 4.22–5.81)
RDW: 12.8 % (ref 11.5–15.5)
WBC: 4.6 10*3/uL (ref 4.0–10.5)
nRBC: 0 % (ref 0.0–0.2)

## 2021-03-21 LAB — COMPREHENSIVE METABOLIC PANEL
ALT: 12 U/L (ref 0–44)
AST: 19 U/L (ref 15–41)
Albumin: 4.5 g/dL (ref 3.5–5.0)
Alkaline Phosphatase: 67 U/L (ref 38–126)
Anion gap: 9 (ref 5–15)
BUN: 17 mg/dL (ref 8–23)
CO2: 26 mmol/L (ref 22–32)
Calcium: 9.7 mg/dL (ref 8.9–10.3)
Chloride: 100 mmol/L (ref 98–111)
Creatinine, Ser: 0.89 mg/dL (ref 0.61–1.24)
GFR, Estimated: >60 mL/min (ref >60)
Glucose, Bld: 95 mg/dL (ref 70–99)
Potassium: 3.6 mmol/L (ref 3.5–5.1)
Sodium: 135 mmol/L (ref 135–145)
Total Bilirubin: 0.9 mg/dL (ref 0.3–1.2)
Total Protein: 6.8 g/dL (ref 6.5–8.1)

## 2021-03-21 LAB — BRAIN NATRIURETIC PEPTIDE: B Natriuretic Peptide: 102.8 pg/mL — ABNORMAL HIGH (ref 0.0–100.0)

## 2021-03-21 LAB — RESP PANEL BY RT-PCR (FLU A&B, COVID) ARPGX2
Influenza A by PCR: NEGATIVE
Influenza B by PCR: NEGATIVE
SARS Coronavirus 2 by RT PCR: NEGATIVE

## 2021-03-21 LAB — TROPONIN I (HIGH SENSITIVITY): Troponin I (High Sensitivity): 4 ng/L (ref <18)

## 2021-03-21 LAB — MAGNESIUM: Magnesium: 1.7 mg/dL (ref 1.7–2.4)

## 2021-03-21 NOTE — ED Provider Notes (Signed)
[facility] EMERGENCY DEPT Provider Note   CSN: [id number] Arrival date & time: 03/21/21  5102     History Chief Complaint  Patient presents with   Palpitations    Evan Robinson is a 75 y.o. male.  HPI     75 year old male with history of hypertension, dyslipidemia, chronic facial pain with flushing, burning and allodynia, persistent (now paroxysmal) atrial fibrillatoin CHADSVasc 3 on eliquis, diltiazem, flecainide (Dr. Domenica Fail [facility]) episode of sinus bradycardia, aortic root/ascending aorta dilation, OSA , history of prostate cancer, who presents with labile heart rates, palpitation and fatigue.  Had been doing ok on flecainide, eliquis Anytime doing anything this week pulse has been going up with just getting gup and wwalking around, feels like when he had atrial fibrillation last time Was told flecainide was going to keep him in rhythm More severe over last week leading to visit Spoke with Cardiologist about ablation but has not had issues with it so had not pursued it  For about one week having palpitations and HR increasing with minimal activity, reminds him of having atrial fibrillation previously, feels exactly the same.   Feels that, once HR up will have fatigue the rest of the day No shortness of breath, chest pain No cough, fever, sore throat, abdominal pain, leg swelling or pain, orthopnea  Had what he thought was a bug a week or so ago, had some diarrhea, negative covid test, in [place] last weekend.  Did have some spiking HR end of October but did not have fatigue.  Normally likes to walk but has not been able to because of fatigue. Some lightheadedness when going from sitting to standing but temporary.  Have not missed any eliquis doses, no change in medication    Past Medical History:  Diagnosis Date   Dyslipidemia    H/O prostate cancer 2003   s/p radical prostatectomy (uro Dr. Rush Farmer in [place] yearly)   Headache 2012    thorough w/u - unrevealing.  has seen 3 neurologists, MRI, LP nonacute.  ESR = 3   HTN (hypertension)    Hypogonadism male    Low testosterone    Migraines    OSA on CPAP    Renal cyst 12/2010   complex cyst upper pole L kidney with no malignancy by MRI   Vitamin D deficiency     Patient Active Problem List   Diagnosis Date Noted   Chronic tension-type headache, not intractable 11/14/2019   Trigeminal neuralgia syndrome 03/05/2019   Vitamin B 12 deficiency 08/13/2018   Sleep disturbance 07/13/2018   Neuropathic pain 06/20/2018   Facial pain, atypical 04/05/2018   Vitamin D deficiency    OSA on CPAP    HTN (hypertension)    Headache    Low testosterone    H/O prostate cancer    Dyslipidemia     Past Surgical History:  Procedure Laterality Date   CHOLECYSTECTOMY  2006   COLONOSCOPY WITH PROPOFOL N/A 02/01/2017   Procedure: COLONOSCOPY WITH PROPOFOL;  Surgeon: Toledo, Benay Pike, MD;  Location: [facility];  Service: Gastroenterology;  Laterality: N/A;   ESOPHAGOGASTRODUODENOSCOPY (EGD) WITH PROPOFOL N/A 02/01/2017   Procedure: ESOPHAGOGASTRODUODENOSCOPY (EGD) WITH PROPOFOL;  Surgeon: Toledo, Benay Pike, MD;  Location: [facility];  Service: Gastroenterology;  Laterality: N/A;   HERNIA REPAIR  08/2010   PROSTATECTOMY  2003       Family History  Problem Relation Age of Onset   Alcohol abuse Father    Cancer Father  prostate   CAD Father        possible MI   Alcohol abuse Paternal Grandfather    Stroke Mother    Diabetes Neg Hx     Social History   Tobacco Use   Smoking status: Never   Smokeless tobacco: Never  Vaping Use   Vaping Use: Never used  Substance Use Topics   Alcohol use: No   Drug use: No    Home Medications Prior to Admission medications   Medication Sig Start Date End Date Taking? Authorizing Provider  ALPRAZolam Duanne Moron) 0.5 MG tablet Take 0.5 mg by mouth 2 (two) times daily as needed. 09/22/20   [provider]  baclofen  (LIORESAL) 10 MG tablet Take 0.5 tablets (5 mg total) by mouth at bedtime. 08/25/20   Jamse Arn, MD  cholecalciferol (VITAMIN D) 1000 UNITS tablet Take 2,000 Units by mouth daily.    [provider]  diltiazem (CARDIZEM CD) 120 MG 24 hr capsule Take 120 mg by mouth daily. 08/13/19   [provider]  ELIQUIS 5 MG TABS tablet Take 5 mg by mouth 2 (two) times daily. 11/29/19   [provider]  flecainide (TAMBOCOR) 50 MG tablet Take 50 mg by mouth 2 (two) times daily. 12/05/19   [provider]  gemfibrozil (LOPID) 600 MG tablet Take 600 mg by mouth 2 (two) times daily before a meal.    [provider]  hydrochlorothiazide (HYDRODIURIL) 25 MG tablet Take 25 mg by mouth daily. 11/11/19   [provider]  HYDROcodone-acetaminophen (NORCO/VICODIN) 5-325 MG per tablet Take 1 tablet by mouth every 6 (six) hours as needed (headache).    [provider]  ibuprofen (ADVIL) 800 MG tablet Take 800 mg by mouth every 6 (six) hours as needed. 10/10/19   [provider]  losartan (COZAAR) 25 MG tablet Take 25 mg by mouth daily.    [provider]  Multiple Vitamin (MULTIVITAMIN) tablet Take 1 tablet by mouth daily.    [provider]  NONFORMULARY OR COMPOUNDED ITEM Ketamine 10%, Baclofen 2%, Cyclobenzaprine 2%, Ketoprofen 10%, Gabapentin 6%, Bupivacaine 1%, Amitryptiline 5% Disp 60 gm 05/20/20   Patel, Domenick Bookbinder, MD  omeprazole (PRILOSEC) 20 MG capsule Take 20 mg by mouth daily.    [provider]  omeprazole (PRILOSEC) 40 MG capsule Take 40 mg by mouth daily. 09/12/20   [provider]  pregabalin (LYRICA) 100 MG capsule Take 1 capsule (100 mg total) by mouth 3 (three) times daily. 11/27/20   Bayard Hugger, NP  pregabalin (LYRICA) 50 MG capsule TAKE 1 CAPSULE(50 MG) BY MOUTH DAILY 11/27/20   Bayard Hugger, NP  RESTASIS MULTIDOSE 0.05 % ophthalmic emulsion SMARTSIG:In Eye(s) 11/10/20   [provider]  vitamin B-12 (CYANOCOBALAMIN) 1000 MCG tablet Take 1 tablet (1,000 mcg total) by mouth daily. 06/20/18   Jamse Arn, MD  zolpidem (AMBIEN) 10 MG tablet Take 10 mg by mouth at bedtime. 11/15/20   [provider]    Allergies    Fenofibrate  Review of Systems   Review of Systems  Constitutional:  Positive for fatigue. Negative for fever.  HENT:  Negative for sore throat.   Eyes:  Negative for visual disturbance.  Respiratory:  Negative for cough and shortness of breath.   Cardiovascular:  Positive for palpitations. Negative for chest pain.  Gastrointestinal:  Negative for abdominal pain, nausea and vomiting. Diarrhea: over one week ago resolved. Genitourinary:  Negative for difficulty urinating.  Musculoskeletal:  Negative for back pain and neck stiffness.  Skin:  Negative for rash.  Neurological:  Positive for light-headedness. Negative for syncope and headaches.   Physical Exam Updated Vital Signs BP 132/73   Pulse (!) 49   Temp 98 F (36.7 C) (Oral)   Resp 12   SpO2 96%   Physical Exam Vitals and nursing note reviewed.  Constitutional:      General: He is not in acute distress.    Appearance: He is well-developed. He is not diaphoretic.  HENT:     Head: Normocephalic and atraumatic.  Eyes:     Conjunctiva/sclera: Conjunctivae normal.  Cardiovascular:     Rate and Rhythm: Regular rhythm. Bradycardia present.     Heart sounds: Normal heart sounds. No murmur heard.   No friction rub. No gallop.  Pulmonary:     Effort: Pulmonary effort is normal. No respiratory distress.     Breath sounds: Normal breath sounds. No wheezing or rales.  Abdominal:     General: There is no distension.     Palpations: Abdomen is soft.     Tenderness: There is no abdominal tenderness. There is no guarding.  Musculoskeletal:     Cervical back: Normal range of motion.  Skin:    General: Skin is warm and dry.  Neurological:     Mental Status: He is alert and  oriented to person, place, and time.    ED Results / Procedures / Treatments   Labs (all labs ordered are listed, but only abnormal results are displayed) Labs Reviewed  BRAIN NATRIURETIC PEPTIDE - Abnormal; Notable for the following components:      Result Value   B Natriuretic Peptide 102.8 (*)    All other components within normal limits  RESP PANEL BY RT-PCR (FLU A&B, COVID) ARPGX2  CBC WITH DIFFERENTIAL/PLATELET  COMPREHENSIVE METABOLIC PANEL  MAGNESIUM  TROPONIN I (HIGH SENSITIVITY)    EKG EKG Interpretation  Date/Time:  _41  year old male with history of hypertension, dyslipidemia, chronic facial pain with flushing, burning and allodynia, persistent (now paroxysmal) atrial fibrillatoin CHADSVasc 3 on  eliquis, diltiazem, flecainide (Dr. Domenica Fail [facility]) episode of sinus bradycardia, aortic root/ascending aorta dilation, OSA , history of prostate cancer,  who presents with labile heart rates, palpitation and fatigue.  Reports symptoms identical to when he was in atrial fibrillation previously.  He is not currently in atrial fibrillation, however given description of heart rate increases with minimal exertion, and history of prior atrial fibrillation feel it is likely he has been in and out of it at home.  Labs without clinically significant abnormalities, discussed magnesium low normal, reasonable to take oral supplement for a few days if tolerated. Troponin negative, no signs of ACS. BNP 100s, no sign of CHF or other abnormalities on CXR.  Doubt PE given adherence to eliquis, no hypoxia or tachycardia in ED. Suspect likely triggered by viral infection, possible dehydration last week.  Difficult to make medication adjustments given hx of bradycardia with bradycardia in ED, and currently in normal rhythm.  Recommend close Cardiology follow up. Given number and placed referral for afib clinic as they would like to establish care in [place] if possible but if it is faster happy to follow up with current primary Cardiologist in [place]. Patient discharged in stable condition with understanding of reasons to return.    Final Clinical Impression(s) / ED Diagnoses Final diagnoses:  Other fatigue  Palpitations  Paroxysmal atrial fibrillation [facility])    Rx / DC Orders ED Discharge Orders          Ordered    Ambulatory referral to Cardiology        03/21/21 1117             Gareth Morgan, MD 03/21/21 2313

## 2021-03-21 NOTE — ED Triage Notes (Signed)
He reports feeling an increase in his heart rate whenever performing any physical activity x 1-2 weeks. He reports having a history of a-fib with Cardioversion; and has been on flecanide and Eliquis x ~ 2 years now. He tells me his normal resting heart rate is in the 60's. He is ambulatory and in no distress.

## 2021-03-24 ENCOUNTER — Ambulatory Visit (HOSPITAL_COMMUNITY)
Admission: RE | Admit: 2021-03-24 | Discharge: 2021-03-24 | Disposition: A | Discharge status: Home / Self Care | Payer: Medicare PPO | Source: Ambulatory Visit | Attending: Nurse Practitioner | Admitting: Nurse Practitioner

## 2021-03-24 ENCOUNTER — Encounter (HOSPITAL_COMMUNITY): Payer: Self-pay | Admitting: Nurse Practitioner

## 2021-03-24 VITALS — BP 136/66 | HR 57 | Ht 72.0 in | Wt 193.8 lb

## 2021-03-24 DIAGNOSIS — I10 Essential (primary) hypertension: Secondary | ICD-10-CM | POA: Insufficient documentation

## 2021-03-24 DIAGNOSIS — Z7901 Long term (current) use of anticoagulants: Secondary | ICD-10-CM | POA: Insufficient documentation

## 2021-03-24 DIAGNOSIS — Z8546 Personal history of malignant neoplasm of prostate: Secondary | ICD-10-CM | POA: Diagnosis not present

## 2021-03-24 DIAGNOSIS — D6869 Other thrombophilia: Secondary | ICD-10-CM

## 2021-03-24 DIAGNOSIS — I480 Paroxysmal atrial fibrillation: Secondary | ICD-10-CM | POA: Diagnosis present

## 2021-03-24 DIAGNOSIS — E785 Hyperlipidemia, unspecified: Secondary | ICD-10-CM | POA: Insufficient documentation

## 2021-03-24 DIAGNOSIS — G4733 Obstructive sleep apnea (adult) (pediatric): Secondary | ICD-10-CM | POA: Diagnosis not present

## 2021-03-24 MED ORDER — DILTIAZEM HCL 30 MG PO TABS: ORAL_TABLET | ORAL | 1 refills | Status: DC

## 2021-03-24 NOTE — Progress Notes (Signed)
Primary Care Physician: Henrietta Dine, MD Referring Physician: [facility] ER f/u    Evan Robinson is a 75 y.o. male with a h/o hypertension, dyslipidemia, chronic facial pain with flushing, burning and allodynia, persistent (now paroxysmal) atrial fibrillatoin CHADSVasc 3 on eliquis, diltiazem, flecainide (Dr. Domenica Fail [facility]) episode of sinus bradycardia, aortic root/ascending aorta dilation, OSA , history of prostate cancer, who presents with labile heart rates, palpitation and fatigue. CHA2DS2VASc  score of 3 on eliquis.   He presented to [facility] ER with more persistent palpitations, elevation of HR over a few weeks.  He was in SR. He did have a viral infection, possible dehydration last week. He was d/c from  ER without ay changes. Referred here as he wanted to establish care in [place].   He is in SR today. He is wanting to pursue ablation. This is the first time he has had  breakthrough afib since he has been on flecainide x 2 years. He is very symptomatic in afib. His Fitbit did document some HR's up to 140 bpm, but not sustained.  He is on flecainide 50 mg bid but has first degree block so would prefer not to increase flecainide . He is also on cardizem daily but HR is in the 50's during the day and 40's at night with his Fitbit, so would prefer not to increase rate control.   He is active, uses CPAP for OSA, no tobacco or alcohol use, minimal caffeine use.   Today, he denies symptoms of palpitations, chest pain, shortness of breath, orthopnea, PND, lower extremity edema, dizziness, presyncope, syncope, or neurologic sequela. The patient is tolerating medications without difficulties and is otherwise without complaint today.   Past Medical History:  Diagnosis Date   Dyslipidemia    H/O prostate cancer 2003   s/p radical prostatectomy (uro Dr. Rush Farmer in [place] yearly)   Headache 2012   thorough w/u - unrevealing.  has seen 3 neurologists, MRI, LP nonacute.  ESR = 3   HTN  (hypertension)    Hypogonadism male    Low testosterone    Migraines    OSA on CPAP    Renal cyst 12/2010   complex cyst upper pole L kidney with no malignancy by MRI   Vitamin D deficiency    Past Surgical History:  Procedure Laterality Date   CHOLECYSTECTOMY  2006   COLONOSCOPY WITH PROPOFOL N/A 02/01/2017   Procedure: COLONOSCOPY WITH PROPOFOL;  Surgeon: Toledo, Benay Pike, MD;  Location: [facility] ENDOSCOPY;  Service: Gastroenterology;  Laterality: N/A;   ESOPHAGOGASTRODUODENOSCOPY (EGD) WITH PROPOFOL N/A 02/01/2017   Procedure: ESOPHAGOGASTRODUODENOSCOPY (EGD) WITH PROPOFOL;  Surgeon: Toledo, Benay Pike, MD;  Location: [facility] ENDOSCOPY;  Service: Gastroenterology;  Laterality: N/A;   HERNIA REPAIR  08/2010   PROSTATECTOMY  2003    Current Outpatient Medications  Medication Sig Dispense Refill   ALPRAZolam (XANAX) 0.5 MG tablet Take 0.5 mg by mouth 2 (two) times daily as needed.     baclofen (LIORESAL) 10 MG tablet Take 0.5 tablets (5 mg total) by mouth at bedtime. 30 each 2   cholecalciferol (VITAMIN D) 1000 UNITS tablet Take 2,000 Units by mouth daily.     diltiazem (CARDIZEM CD) 120 MG 24 hr capsule Take 120 mg by mouth daily.     ELIQUIS 5 MG TABS tablet Take 5 mg by mouth 2 (two) times daily.     flecainide (TAMBOCOR) 50 MG tablet Take 50 mg by mouth 2 (two) times daily.  gemfibrozil (LOPID) 600 MG tablet Take 600 mg by mouth daily before breakfast.     hydrochlorothiazide (HYDRODIURIL) 25 MG tablet Take 25 mg by mouth daily.     HYDROcodone-acetaminophen (NORCO/VICODIN) 5-325 MG per tablet Take 1 tablet by mouth every 6 (six) hours as needed (headache).     losartan (COZAAR) 25 MG tablet Take 25 mg by mouth daily.     Multiple Vitamin (MULTIVITAMIN) tablet Take 1 tablet by mouth daily.     omeprazole (PRILOSEC) 20 MG capsule Take 20 mg by mouth daily.     pregabalin (LYRICA) 100 MG capsule Take 1 capsule (100 mg total) by mouth 3 (three) times daily. 90 capsule 3   pregabalin  (LYRICA) 50 MG capsule TAKE 1 CAPSULE(50 MG) BY MOUTH DAILY 30 capsule 3   vitamin B-12 (CYANOCOBALAMIN) 1000 MCG tablet Take 1 tablet (1,000 mcg total) by mouth daily. 30 tablet 1   zolpidem (AMBIEN CR) 12.5 MG CR tablet TAKE 1 TABLET(12.5 MG) BY MOUTH EVERY NIGHT     No current facility-administered medications for this encounter.    Allergies  Allergen Reactions   Fenofibrate Other (See Comments)    myalgias Other reaction(s): Other (See Comments) Other reaction(s): Other (See Comments) myalgias myalgias    Social History   Socioeconomic History   Marital status: Married    Spouse name: Not on file   Number of children: Not on file   Years of education: Not on file   Highest education level: Not on file  Occupational History   Not on file  Tobacco Use   Smoking status: Never   Smokeless tobacco: Never  Vaping Use   Vaping Use: Never used  Substance and Sexual Activity   Alcohol use: No   Drug use: No   Sexual activity: Not on file  Other Topics Concern   Not on file  Social History Narrative   "Evan Robinson"   Lives with wife   Occupation: retired, was Pharmacist, hospital   Activity: walks daily, some gym   Diet:    Social Determinants of Health   Financial Resource Strain: Not on file  Food Insecurity: Not on file  Transportation Needs: Not on file  Physical Activity: Not on file  Stress: Not on file  Social Connections: Not on file  Intimate Partner Violence: Not on file    Family History  Problem Relation Age of Onset   Alcohol abuse Father    Cancer Father        prostate   CAD Father        possible MI   Alcohol abuse Paternal Grandfather    Stroke Mother    Diabetes Neg Hx     ROS- All systems are reviewed and negative except as per the HPI above  Physical Exam: Vitals:   03/24/21 0816  Weight: 87.9 kg  Height: 6' (1.829 m)   Wt Readings from Last 3 Encounters:  03/24/21 87.9 kg  02/12/21 87.8 kg  11/27/20 89.8 kg    Labs: Lab Results   Component Value Date   NA 135 03/21/2021   K 3.6 03/21/2021   CL 100 03/21/2021   CO2 26 03/21/2021   GLUCOSE 95 03/21/2021   BUN 17 03/21/2021   CREATININE 0.89 03/21/2021   CALCIUM 9.7 03/21/2021   MG 1.7 03/21/2021   No results found for: INR Lab Results  Component Value Date   CHOL 202 (A) 07/02/2012   HDL 37 07/02/2012   TRIG 321 07/02/2012  GEN- The patient is well appearing, alert and oriented x 3 today.   Head- normocephalic, atraumatic Eyes-  Sclera clear, conjunctiva pink Ears- hearing intact Oropharynx- clear Neck- supple, no JVP Lymph- no cervical lymphadenopathy Lungs- Clear to ausculation bilaterally, normal work of breathing Heart- Regular rate and rhythm, no murmurs, rubs or gallops, PMI not laterally displaced GI- soft, NT, ND, + BS Extremities- no clubbing, cyanosis, or edema MS- no significant deformity or atrophy Skin- no rash or lesion Psych- euthymic mood, full affect Neuro- strength and sensation are intact  EKG-sinus brady at 57 bpm, pr int 216 ms, qrs int 128 ms, qtc 439 ms     Assessment and Plan:  1. Paroxysmal afib Recent breakthrough palpitations with increased HR  Now for the last 2 days he has not noted any irregular heart beat  Noted around the time of a viral illness He will continue flecainide 50 mg bid and Cardizem 120 mg daily Do not want to increase these 2 meds for reasons outlined in H&P  I will RX 30 mg Cardizem to use as needed for breakthrough afib  I will update echo and refer to EP to be considered for an ablation   2. CHA2DS2VASc score of 3 Continue eliquis 5 mg bid   3. HTN Stable   Ep consult requested  Afib clinic as needed   Geroge Baseman. Anay Rathe, [facility] [address] [place], [place] [address] [phone]

## 2021-03-24 NOTE — Patient Instructions (Signed)
Diltiazem 30mg  take 1 tablet every 4 Hours As Needed For HR >100 and Top BP>100

## 2021-03-29 ENCOUNTER — Other Ambulatory Visit: Payer: Self-pay | Admitting: Physical Medicine & Rehabilitation

## 2021-03-29 ENCOUNTER — Other Ambulatory Visit: Payer: Self-pay | Admitting: Registered Nurse

## 2021-04-29 ENCOUNTER — Institutional Professional Consult (permissible substitution): Payer: Medicare PPO | Admitting: Cardiology

## 2021-05-06 ENCOUNTER — Ambulatory Visit (HOSPITAL_COMMUNITY)
Admission: RE | Admit: 2021-05-06 | Discharge: 2021-05-06 | Disposition: A | Discharge status: Home / Self Care | Payer: Medicare PPO | Source: Ambulatory Visit | Attending: Nurse Practitioner | Admitting: Nurse Practitioner

## 2021-05-06 ENCOUNTER — Other Ambulatory Visit: Payer: Self-pay

## 2021-05-06 DIAGNOSIS — G4730 Sleep apnea, unspecified: Secondary | ICD-10-CM | POA: Insufficient documentation

## 2021-05-06 DIAGNOSIS — I10 Essential (primary) hypertension: Secondary | ICD-10-CM | POA: Insufficient documentation

## 2021-05-06 DIAGNOSIS — I480 Paroxysmal atrial fibrillation: Secondary | ICD-10-CM | POA: Diagnosis present

## 2021-05-06 DIAGNOSIS — I340 Nonrheumatic mitral (valve) insufficiency: Secondary | ICD-10-CM | POA: Diagnosis not present

## 2021-05-06 DIAGNOSIS — E785 Hyperlipidemia, unspecified: Secondary | ICD-10-CM | POA: Diagnosis not present

## 2021-05-06 LAB — ECHOCARDIOGRAM COMPLETE
Area-P 1/2: 1.93 cm2
Calc EF: 45.8 %
S' Lateral: 3.7 cm
Single Plane A2C EF: 46.4 %
Single Plane A4C EF: 43.8 %

## 2021-05-06 NOTE — Progress Notes (Signed)
°  Echocardiogram 2D Echocardiogram has been performed.  Leta Jungling M 05/06/2021, 9:42 AM

## 2021-05-11 ENCOUNTER — Other Ambulatory Visit: Payer: Self-pay

## 2021-05-11 ENCOUNTER — Encounter: Payer: Self-pay | Admitting: *Deleted

## 2021-05-11 ENCOUNTER — Ambulatory Visit (HOSPITAL_BASED_OUTPATIENT_CLINIC_OR_DEPARTMENT_OTHER): Payer: Medicare PPO | Admitting: Cardiology

## 2021-05-11 ENCOUNTER — Ambulatory Visit: Payer: Medicare PPO | Admitting: Cardiology

## 2021-05-11 ENCOUNTER — Encounter: Payer: Self-pay | Admitting: Cardiology

## 2021-05-11 VITALS — BP 128/74 | HR 60 | Ht 72.0 in | Wt 194.2 lb

## 2021-05-11 DIAGNOSIS — I10 Essential (primary) hypertension: Secondary | ICD-10-CM

## 2021-05-11 DIAGNOSIS — I4891 Unspecified atrial fibrillation: Secondary | ICD-10-CM

## 2021-05-11 DIAGNOSIS — I480 Paroxysmal atrial fibrillation: Secondary | ICD-10-CM | POA: Diagnosis not present

## 2021-05-11 DIAGNOSIS — Z9989 Dependence on other enabling machines and devices: Secondary | ICD-10-CM

## 2021-05-11 DIAGNOSIS — G4733 Obstructive sleep apnea (adult) (pediatric): Secondary | ICD-10-CM

## 2021-05-11 MED ORDER — METOPROLOL TARTRATE 25 MG PO TABS: 25.0000 mg | ORAL_TABLET | Freq: Once | ORAL | 0 refills | Status: DC | PRN

## 2021-05-11 NOTE — Patient Instructions (Addendum)
Medication Instructions:  Your physician recommends that you continue on your current medications as directed. Please refer to the Current Medication list given to you today. *If you need a refill on your cardiac medications before your next appointment, please call your pharmacy*  Lab Work: None. If you have labs (blood work) drawn today and your tests are completely normal, you will receive your results only by: MyChart Message (if you have MyChart) OR A paper copy in the mail If you have any lab test that is abnormal or we need to change your treatment, we will call you to review the results.  Testing/Procedures: Your physician has requested that you have cardiac CT. Cardiac computed tomography (CT) is a painless test that uses an x-ray machine to take clear, detailed pictures of your heart. For further information please visit [email or web address] Please follow instruction sheet as given.   Your physician has recommended that you have an ablation. Catheter ablation is a medical procedure used to treat some cardiac arrhythmias (irregular heartbeats). During catheter ablation, a long, thin, flexible tube is put into a blood vessel in your groin (upper thigh), or neck. This tube is called an ablation catheter. It is then guided to your heart through the blood vessel. Radio frequency waves destroy small areas of heart tissue where abnormal heartbeats may cause an arrhythmia to start. Please see the instruction sheet given to you today.   Follow-Up: At [facility], you and your health needs are our priority.  As part of our continuing mission to provide you with exceptional heart care, we have created designated Provider Care Teams.  These Care Teams include your primary Cardiologist (physician) and Advanced Practice Providers (APPs -  Physician Assistants and Nurse Practitioners) who all work together to provide you with the care you need, when you need it.  Your physician wants you to  follow-up in: see ablation instructions.   We recommend signing up for the patient portal called "MyChart".  Sign up information is provided on this After Visit Summary.  MyChart is used to connect with patients for Virtual Visits (Telemedicine).  Patients are able to view lab/test results, encounter notes, upcoming appointments, etc.  Non-urgent messages can be sent to your provider as well.   To learn more about what you can do with MyChart, go to [email or web address].    Any Other Special Instructions Will Be Listed Below (If Applicable).  Cardiac Ablation Cardiac ablation is a procedure to destroy (ablate) some heart tissue that is sending bad signals. These bad signals cause problems in heart rhythm. The heart has many areas that make these signals. If there are problems in these areas, they can make the heart beat in a way that is not normal. Destroying some tissues can help make the heart rhythm normal. Tell your doctor about: Any allergies you have. All medicines you are taking. These include vitamins, herbs, eye drops, creams, and over-the-counter medicines. Any problems you or family members have had with medicines that make you fall asleep (anesthetics). Any blood disorders you have. Any surgeries you have had. Any medical conditions you have, such as kidney failure. Whether you are pregnant or may be pregnant. What are the risks? This is a safe procedure. But problems may occur, including: Infection. Bruising and bleeding. Bleeding into the chest. Stroke or blood clots. Damage to nearby areas of your body. Allergies to medicines or dyes. The need for a pacemaker if the normal system is damaged. Failure of the procedure to treat the problem.  What happens before the procedure? Medicines Ask your doctor about: Changing or stopping your normal medicines. This is important. Taking aspirin and ibuprofen. Do not take these medicines unless your doctor tells you to take  them. Taking other medicines, vitamins, herbs, and supplements. General instructions Follow instructions from your doctor about what you cannot eat or drink. Plan to have someone take you home from the hospital or clinic. If you will be going home right after the procedure, plan to have someone with you for 24 hours. Ask your doctor what steps will be taken to prevent infection. What happens during the procedure?  An IV tube will be put into one of your veins. You will be given a medicine to help you relax. The skin on your neck or groin will be numbed. A cut (incision) will be made in your neck or groin. A needle will be put through your cut and into a large vein. A tube (catheter) will be put into the needle. The tube will be moved to your heart. Dye may be put through the tube. This helps your doctor see your heart. Small devices (electrodes) on the tube will send out signals. A type of energy will be used to destroy some heart tissue. The tube will be taken out. Pressure will be held on your cut. This helps stop bleeding. A bandage will be put over your cut. The exact procedure may vary among doctors and hospitals. What happens after the procedure? You will be watched until you leave the hospital or clinic. This includes checking your heart rate, breathing rate, oxygen, and blood pressure. Your cut will be watched for bleeding. You will need to lie still for a few hours. Do not drive for 24 hours or as long as your doctor tells you. Summary Cardiac ablation is a procedure to destroy some heart tissue. This is done to treat heart rhythm problems. Tell your doctor about any medical conditions you may have. Tell him or her about all medicines you are taking to treat them. This is a safe procedure. But problems may occur. These include infection, bruising, bleeding, and damage to nearby areas of your body. Follow what your doctor tells you about food and drink. You may also be told to  change or stop some of your medicines. After the procedure, do not drive for 24 hours or as long as your doctor tells you. This information is not intended to replace advice given to you by your health care provider. Make sure you discuss any questions you have with your health care provider. Document Revised: 03/21/2019 Document Reviewed: 03/21/2019 Elsevier Patient Education  2022 [employer].

## 2021-05-11 NOTE — Progress Notes (Signed)
Electrophysiology Office Note:    Date:  05/11/2021   ID:  Evan Robinson, DOB Dec 28, 1945, MRN [id number]  PCP:  Henrietta Dine, MD  [facility] Cardiologist:  None  [facility] Electrophysiologist:  Vickie Epley, MD   Referring MD: Sherran Needs, NP   Chief Complaint: AF  History of Present Illness:    Evan Robinson is a 76 y.o. male who presents for an evaluation of AF at the request of Evan Devoid, NP. Their medical history includes pAF on eliquis, ascending aortic dilation, OSA, prostate cancer hx. He previously was prescribed flecainide for his AF. He met with Butch Penny 03/24/2021 and expressed a desire to pursue AF ablation. This was after a visit to the ER with palpitations that were worse over the preceding several weeks.  Today he is with his wife in clinic. He was previously evaluated at [facility] in [place] for the same. Catheter ablation was discussed.      Past Medical History:  Diagnosis Date   Dyslipidemia    H/O prostate cancer 2003   s/p radical prostatectomy (uro Dr. Rush Farmer in [place] yearly)   Headache 2012   thorough w/u - unrevealing.  has seen 3 neurologists, MRI, LP nonacute.  ESR = 3   HTN (hypertension)    Hypogonadism male    Low testosterone    Migraines    OSA on CPAP    Renal cyst 12/2010   complex cyst upper pole L kidney with no malignancy by MRI   Vitamin D deficiency     Past Surgical History:  Procedure Laterality Date   CHOLECYSTECTOMY  2006   COLONOSCOPY WITH PROPOFOL N/A 02/01/2017   Procedure: COLONOSCOPY WITH PROPOFOL;  Surgeon: Toledo, Benay Pike, MD;  Location: [facility] ENDOSCOPY;  Service: Gastroenterology;  Laterality: N/A;   ESOPHAGOGASTRODUODENOSCOPY (EGD) WITH PROPOFOL N/A 02/01/2017   Procedure: ESOPHAGOGASTRODUODENOSCOPY (EGD) WITH PROPOFOL;  Surgeon: Toledo, Benay Pike, MD;  Location: [facility] ENDOSCOPY;  Service: Gastroenterology;  Laterality: N/A;   HERNIA REPAIR  08/2010   PROSTATECTOMY  2003    Current  Medications: Current Meds  Medication Sig   ALPRAZolam (XANAX) 0.5 MG tablet Take 0.5 mg by mouth 2 (two) times daily as needed.   cholecalciferol (VITAMIN D) 1000 UNITS tablet Take 2,000 Units by mouth daily.   diltiazem (CARDIZEM CD) 120 MG 24 hr capsule Take 120 mg by mouth daily.   diltiazem (CARDIZEM) 30 MG tablet Take 1 Tablet Every 4 Hours As Needed For HR >100 and Top BP>100   ELIQUIS 5 MG TABS tablet Take 5 mg by mouth 2 (two) times daily.   flecainide (TAMBOCOR) 50 MG tablet Take 50 mg by mouth 2 (two) times daily.   gemfibrozil (LOPID) 600 MG tablet Take 600 mg by mouth daily before breakfast.   hydrochlorothiazide (HYDRODIURIL) 25 MG tablet Take 25 mg by mouth daily.   HYDROcodone-acetaminophen (NORCO/VICODIN) 5-325 MG per tablet Take 1 tablet by mouth every 6 (six) hours as needed (headache).   losartan (COZAAR) 25 MG tablet Take 25 mg by mouth daily.   Multiple Vitamin (MULTIVITAMIN) tablet Take 1 tablet by mouth daily.   omeprazole (PRILOSEC) 20 MG capsule Take 20 mg by mouth daily.   pregabalin (LYRICA) 100 MG capsule TAKE 1 CAPSULE(100 MG) BY MOUTH THREE TIMES DAILY   pregabalin (LYRICA) 50 MG capsule TAKE 1 CAPSULE(50 MG) BY MOUTH DAILY as needed for breakthrough pain   vitamin B-12 (CYANOCOBALAMIN) 1000 MCG tablet Take 1 tablet (1,000 mcg total) by mouth daily.  zolpidem (AMBIEN CR) 12.5 MG CR tablet TAKE 1 TABLET(12.5 MG) BY MOUTH EVERY NIGHT     Allergies:   Fenofibrate   Social History   Socioeconomic History   Marital status: Married    Spouse name: Not on file   Number of children: Not on file   Years of education: Not on file   Highest education level: Not on file  Occupational History   Not on file  Tobacco Use   Smoking status: Never   Smokeless tobacco: Never  Vaping Use   Vaping Use: Never used  Substance and Sexual Activity   Alcohol use: No   Drug use: No   Sexual activity: Not on file  Other Topics Concern   Not on file  Social History  Narrative   "Gene"   Lives with wife   Occupation: retired, was Pharmacist, hospital   Activity: walks daily, some gym   Diet:    Social Determinants of Radio broadcast assistant Strain: Not on file  Food Insecurity: Not on file  Transportation Needs: Not on file  Physical Activity: Not on file  Stress: Not on file  Social Connections: Not on file     Family History: The patient's family history includes Alcohol abuse in his father and paternal grandfather; CAD in his father; Cancer in his father; Stroke in his mother. There is no history of Diabetes.  ROS:   Please see the history of present illness.    All other systems reviewed and are negative.  EKGs/Labs/Other Studies Reviewed:    The following studies were reviewed today:  05/06/2021 echo - EF50/mildly reduced, RV normal, mild-moderate MR (personally reviewed)  EKG:  The ekg ordered today demonstrates sinus rhythm, low amplitude limb lead QRS   Recent Labs: 03/21/2021: ALT 12; B Natriuretic Peptide 102.8; BUN 17; Creatinine, Ser 0.89; Hemoglobin 14.0; Magnesium 1.7; Platelets 154; Potassium 3.6; Sodium 135  Recent Lipid Panel    Component Value Date/Time   CHOL 202 (A) 07/02/2012 0000   TRIG 321 07/02/2012 0000   HDL 37 07/02/2012 0000   LDLDIRECT 101 07/02/2012 0000    Physical Exam:    VS:  BP 128/74    Pulse 60    Ht 6' (1.829 m)    Wt 194 lb 3.2 oz (88.1 kg)    SpO2 98%    BMI 26.34 kg/m     Wt Readings from Last 3 Encounters:  05/11/21 194 lb 3.2 oz (88.1 kg)  03/24/21 193 lb 12.8 oz (87.9 kg)  02/12/21 193 lb 9.6 oz (87.8 kg)     GEN:  Well nourished, well developed in no acute distress HEENT: Normal NECK: No JVD; No carotid bruits LYMPHATICS: No lymphadenopathy CARDIAC: RRR, no murmurs, rubs, gallops RESPIRATORY:  Clear to auscultation without rales, wheezing or rhonchi  ABDOMEN: Soft, non-tender, non-distended MUSCULOSKELETAL:  No edema; No deformity  SKIN: Warm and dry NEUROLOGIC:  Alert and  oriented x 3 PSYCHIATRIC:  Normal affect       ASSESSMENT:    1. Paroxysmal atrial fibrillation (HCC)   2. OSA on CPAP   3. Primary hypertension    PLAN:    In order of problems listed above:  #Paroxysmal AF Symptomatic. Continued episodes despite treatment with flecainide and diltiazem. On eliquis for stroke prophylaxis.  I discussed treatment options for his AF including antiarrhythmic drug therapy and catheter ablation. We discussed risks, recovery from ablation and compared success rates between catheter ablation and antiarrhythmic drug therapy.  He would like to proceed with catheter ablation.   Risk, benefits, and alternatives to EP study and radiofrequency ablation for afib were also discussed in detail today. These risks include but are not limited to stroke, bleeding, vascular damage, tamponade, perforation, damage to the esophagus, lungs, and other structures, pulmonary vein stenosis, worsening renal function, and death. The patient understands these risk and wishes to proceed.  We will therefore proceed with catheter ablation at the next available time.  Carto, ICE, anesthesia are requested for the procedure.  Will also obtain CT PV protocol prior to the procedure to exclude LAA thrombus and further evaluate atrial anatomy.   #OSA on CPAP Continue CPAP use.  #HTN Controlled today. Continue current medical therapy.    Total time spent with patient today 65 minutes. This includes reviewing records, evaluating the patient and coordinating care.  Medication Adjustments/Labs and Tests Ordered: Current medicines are reviewed at length with the patient today.  Concerns regarding medicines are outlined above.  No orders of the defined types were placed in this encounter.  No orders of the defined types were placed in this encounter.    Signed, Hilton Cork. Quentin Ore, MD, [facility], [facility] 05/11/2021 9:29 AM    Electrophysiology [facility]

## 2021-05-19 ENCOUNTER — Encounter (HOSPITAL_COMMUNITY): Payer: Self-pay | Admitting: *Deleted

## 2021-06-01 ENCOUNTER — Other Ambulatory Visit: Payer: Self-pay

## 2021-06-01 ENCOUNTER — Encounter: Payer: Self-pay | Admitting: Physical Medicine and Rehabilitation

## 2021-06-01 ENCOUNTER — Encounter: Payer: Medicare PPO | Attending: Physical Medicine and Rehabilitation | Admitting: Physical Medicine and Rehabilitation

## 2021-06-01 VITALS — BP 128/72 | HR 58 | Temp 98.1°F | Ht 72.0 in | Wt 200.0 lb

## 2021-06-01 DIAGNOSIS — G894 Chronic pain syndrome: Secondary | ICD-10-CM | POA: Diagnosis not present

## 2021-06-01 DIAGNOSIS — R519 Headache, unspecified: Secondary | ICD-10-CM | POA: Diagnosis not present

## 2021-06-01 DIAGNOSIS — M792 Neuralgia and neuritis, unspecified: Secondary | ICD-10-CM | POA: Insufficient documentation

## 2021-06-01 DIAGNOSIS — G501 Atypical facial pain: Secondary | ICD-10-CM | POA: Insufficient documentation

## 2021-06-01 NOTE — Progress Notes (Signed)
Subjective:    Patient ID: Evan Robinson, male    DOB: [birth year], 76 y.o.   MRN: [id number]  HPI: Evan Robinson is a 76 y.o. male who presents for follow-up of facial pain and shoulder pain.   He returns for follow up appointment for chronic pain and medication refill. He reports he has facial pain and neck pain radiating into his bilateral shoulders He rates his pain 7.His current exercise regime is walking and attending physical therapy two days a week.   His pain has been stable since last visit He has burning in left sided facial pain and shoulder It is worst with friction and exacerbated with lying down.  He has been on the Lyrica for years.  He has never tried CBD oil.  Pain worsens with shaving He tried aloe vera without great benefit He is excited to try Qutenza today He is not sure if he has tried carbamazepine Capsaicin made his skin burn and raw in the past.     Pain Inventory Average Pain 8 Pain Right Now 7 My pain is burning  In the last 24 hours, has pain interfered with the following? General activity 7 Relation with others 8 Enjoyment of life 7 What TIME of day is your pain at its worst? evening and night Sleep (in general) Fair  Pain is worse with: sitting Pain improves with: medication Relief from Meds: 4  Family History  Problem Relation Age of Onset   Alcohol abuse Father    Cancer Father        prostate   CAD Father        possible MI   Alcohol abuse Paternal Grandfather    Stroke Mother    Diabetes Neg Hx    Social History   Socioeconomic History   Marital status: Married    Spouse name: Not on file   Number of children: Not on file   Years of education: Not on file   Highest education level: Not on file  Occupational History   Not on file  Tobacco Use   Smoking status: Never   Smokeless tobacco: Never  Vaping Use   Vaping Use: Never used  Substance and Sexual Activity   Alcohol use: No   Drug use: No   Sexual activity: Not on  file  Other Topics Concern   Not on file  Social History Narrative   "Evan Robinson"   Lives with wife   Occupation: retired, was Pharmacist, hospital   Activity: walks daily, some gym   Diet:    Social Determinants of Health   Financial Resource Strain: Not on file  Food Insecurity: Not on file  Transportation Needs: Not on file  Physical Activity: Not on file  Stress: Not on file  Social Connections: Not on file   Past Surgical History:  Procedure Laterality Date   CHOLECYSTECTOMY  2006   COLONOSCOPY WITH PROPOFOL N/A 02/01/2017   Procedure: COLONOSCOPY WITH PROPOFOL;  Surgeon: Robinson, Evan Pike, MD;  Location: [facility];  Service: Gastroenterology;  Laterality: N/A;   ESOPHAGOGASTRODUODENOSCOPY (EGD) WITH PROPOFOL N/A 02/01/2017   Procedure: ESOPHAGOGASTRODUODENOSCOPY (EGD) WITH PROPOFOL;  Surgeon: Robinson, Evan Pike, MD;  Location: [facility];  Service: Gastroenterology;  Laterality: N/A;   HERNIA REPAIR  08/2010   PROSTATECTOMY  2003   Past Surgical History:  Procedure Laterality Date   CHOLECYSTECTOMY  2006   COLONOSCOPY WITH PROPOFOL N/A 02/01/2017   Procedure: COLONOSCOPY WITH PROPOFOL;  Surgeon: Evan Robinson, Evan Pike, MD;  Location: [facility];  Service: Gastroenterology;  Laterality: N/A;   ESOPHAGOGASTRODUODENOSCOPY (EGD) WITH PROPOFOL N/A 02/01/2017   Procedure: ESOPHAGOGASTRODUODENOSCOPY (EGD) WITH PROPOFOL;  Surgeon: Robinson, Evan Pike, MD;  Location: [facility];  Service: Gastroenterology;  Laterality: N/A;   HERNIA REPAIR  08/2010   PROSTATECTOMY  2003   Past Medical History:  Diagnosis Date   Dyslipidemia    H/O prostate cancer 2003   s/p radical prostatectomy (uro Dr. Rush Robinson in [place] yearly)   Headache 2012   thorough w/u - unrevealing.  has seen 3 neurologists, MRI, LP nonacute.  ESR = 3   HTN (hypertension)    Hypogonadism male    Low testosterone    Migraines    OSA on CPAP    Renal cyst 12/2010   complex cyst upper pole L kidney with no malignancy by  MRI   Vitamin D deficiency    BP 128/72    Pulse (!) 58    Temp 98.1 F (36.7 C)    Ht 6' (1.829 m)    Wt 200 lb (90.7 kg)    SpO2 96%    BMI 27.12 kg/m   Opioid Risk Score:   Fall Risk Score:  `1  Depression screen PHQ 2/9  Depression screen [facility] 2/9 06/01/2021 02/12/2021 11/27/2020 08/25/2020 06/09/2020 05/11/2020 01/30/2020  Decreased Interest 0 0 0 0 0 0 0  Down, Depressed, Hopeless 0 0 0 0 0 - 0  PHQ - 2 Score 0 0 0 0 0 0 0      Review of Systems  Constitutional: Negative.   HENT: Negative.    Eyes: Negative.   Respiratory: Negative.    Cardiovascular: Negative.   Gastrointestinal: Negative.   Endocrine: Negative.   Musculoskeletal:  Positive for neck pain.       Nerve pain , pain in shoulders ,   Skin: Negative.   Allergic/Immunologic: Negative.   Neurological: Negative.   Hematological: Negative.   Psychiatric/Behavioral: Negative.        Objective:   Physical Exam Vitals and nursing note reviewed.  Constitutional:      Appearance: Normal appearance.  Cardiovascular:     Rate and Rhythm: Normal rate and regular rhythm.     Pulses: Normal pulses.     Heart sounds: Normal heart sounds.  Pulmonary:     Effort: Pulmonary effort is normal.     Breath sounds: Normal breath sounds.  Musculoskeletal:     Cervical back: Normal range of motion and neck supple.     Comments: Normal Muscle Bulk and Muscle Testing Reveals:  Upper Extremities: Full ROM and Muscle Strength  5/5 Lower Extremities: Full ROM and Muscle Strength 5/5 Arises from chair with ease Narrow Based  Gait  Tight cervical myofascia    Skin:    General: Skin is warm and dry. No open lesions over back or shoulders.  Neurological:     Mental Status: He is alert and oriented to person, place, and time.  Psychiatric:        Mood and Affect: Mood normal.        Behavior: Behavior normal.         Assessment & Plan:   1. Chronic facial pain with flushing and burning and allodynia             MRI from  10/2017 reviewed, revealing cervical cord compression. 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             Lidoderm patch/ointment, Zyrtec, Tomapax, Almond milk, Elavil, Ketamine infusion (cound not tolerate), hypotherapy, compound medication, hypnosis without benefit             Unable to trial carbamazepine as patient is on Eliquis             Steroids made symptoms more severe             Side effects with Cymbalta, oxcarbazepine, Pamelor             CompletedPhysical Therapy two days a week. Continue HEP             ContinueLyrica 100 mg TID and Lyrica 50 mg daily as needed for Break through Pain. Continue HEP as Tolerated and Continue to Monitor.   Recommend topical CBD oil- discussed its benefits in reducing inflammation, pain, insomnia, and anxiety.  -Discussed that CBD oil differs from marijuana in that it does not contain THC- the substance that causes euphoria.  -Discussed that it is made from the hemp plant.  -It has been used for thousands of years -preliminary research suggests that if may be able to shrink cancerous tumors, stop plaque formation in Alzheimer's Disease, and slow the progress of brain disease from concussions.  -Additional benefits that have been demonstrated in studies include improved nausea, indigestion, and brain health, and reduced seizures.  -In a survey 92% of patients who tried medical cannabis felt it improved symptoms such as chronic pain, arthritis, migraines, and cancer.    -recommended certain CBD brand  -discussed procedures that could potentially help, but could also make the pain worse.   -Provided with a pain relief journal and discussed that it contains foods and lifestyle tips to naturally help to improve pain. Discussed that these lifestyle strategies are also very good for health unlike some  medications which can have negative side effects. Discussed that the act of keeping a journal can be therapeutic and helpful to realize patterns what helps to trigger and alleviate pain.    -continue B complex supplement.   -Discussed Qutenza as an option for neuropathic pain control. Discussed that this is a capsaicin patch, stronger than capsaicin cream. Discussed that it is currently approved for diabetic peripheral neuropathy and post-herpetic neuralgia, but that it has also shown benefit in treating other forms of neuropathy. Provided patient with link to site to learn more about the patch: [employer]. Discussed that the patch would be placed in office and benefits usually last 3 months. Discussed that unintended exposure to capsaicin can cause severe irritation of eyes, mucous membranes, respiratory tract, and skin, but that Qutenza is a local treatment and does not have the systemic side effects of other nerve medications. Discussed that there may be pain, itching, erythema, and decreased sensory function associated with the application of Qutenza. Side effects usually subside within 1 week. A cold pack of analgesic medications can help with these side effects. Blood pressure can also be increased due to pain associated with administration of the patch.  2, Chronic Pain Syndrome:  Continue Lyrica and  Baclofen 5 mg QHS. Continue to Monitor. No reports of drowsiness. Continue to monitor.  3. Impaired balance: affected by Lyrica. Dicussed decreasing dose to 50/100/100 4. Hypersensitive skin: discussed that he may feel increased pain from Qutenza due to this.   .  F/u w/ me in 4 months

## 2021-06-02 ENCOUNTER — Other Ambulatory Visit: Payer: Self-pay | Admitting: Physical Medicine and Rehabilitation

## 2021-06-03 NOTE — Telephone Encounter (Signed)
Rx called to pharmacy. Message left on voicemail.

## 2021-06-07 ENCOUNTER — Other Ambulatory Visit: Payer: Self-pay | Admitting: Registered Nurse

## 2021-07-09 ENCOUNTER — Other Ambulatory Visit: Payer: Self-pay

## 2021-07-09 ENCOUNTER — Other Ambulatory Visit: Payer: Medicare PPO | Admitting: *Deleted

## 2021-07-09 DIAGNOSIS — I10 Essential (primary) hypertension: Secondary | ICD-10-CM

## 2021-07-09 DIAGNOSIS — I4891 Unspecified atrial fibrillation: Secondary | ICD-10-CM

## 2021-07-09 DIAGNOSIS — G4733 Obstructive sleep apnea (adult) (pediatric): Secondary | ICD-10-CM

## 2021-07-09 DIAGNOSIS — I480 Paroxysmal atrial fibrillation: Secondary | ICD-10-CM

## 2021-07-09 LAB — CBC WITH DIFFERENTIAL/PLATELET
Basophils Absolute: 0 10*3/uL (ref 0.0–0.2)
Basos: 1 %
EOS (ABSOLUTE): 0.2 10*3/uL (ref 0.0–0.4)
Eos: 4 %
Hematocrit: 42.3 % (ref 37.5–51.0)
Hemoglobin: 14.7 g/dL (ref 13.0–17.7)
Immature Grans (Abs): 0 10*3/uL (ref 0.0–0.1)
Immature Granulocytes: 0 %
Lymphocytes Absolute: 1.2 10*3/uL (ref 0.7–3.1)
Lymphs: 25 %
MCH: 30.4 pg (ref 26.6–33.0)
MCHC: 34.8 g/dL (ref 31.5–35.7)
MCV: 88 fL (ref 79–97)
Monocytes Absolute: 0.5 10*3/uL (ref 0.1–0.9)
Monocytes: 11 %
Neutrophils Absolute: 2.8 10*3/uL (ref 1.4–7.0)
Neutrophils: 59 %
Platelets: 177 10*3/uL (ref 150–450)
RBC: 4.83 x10E6/uL (ref 4.14–5.80)
RDW: 12.3 % (ref 11.6–15.4)
WBC: 4.8 10*3/uL (ref 3.4–10.8)

## 2021-07-09 LAB — BASIC METABOLIC PANEL
BUN/Creatinine Ratio: 15 (ref 10–24)
BUN: 13 mg/dL (ref 8–27)
CO2: 28 mmol/L (ref 20–29)
Calcium: 9.7 mg/dL (ref 8.6–10.2)
Chloride: 93 mmol/L — ABNORMAL LOW (ref 96–106)
Creatinine, Ser: 0.88 mg/dL (ref 0.76–1.27)
Glucose: 93 mg/dL (ref 70–99)
Potassium: 3.7 mmol/L (ref 3.5–5.2)
Sodium: 132 mmol/L — ABNORMAL LOW (ref 134–144)
eGFR: 89 mL/min/{1.73_m2} (ref >59)

## 2021-07-21 ENCOUNTER — Telehealth (HOSPITAL_COMMUNITY): Payer: Self-pay | Admitting: Emergency Medicine

## 2021-07-21 NOTE — Telephone Encounter (Signed)
Attempted to call patient regarding upcoming cardiac CT appointment. °Left message on voicemail with name and callback number °Karlee Staff RN Navigator Cardiac Imaging ° [facility] °[phone] Office °[phone] Cell ° °

## 2021-07-22 ENCOUNTER — Telehealth (HOSPITAL_COMMUNITY): Payer: Self-pay | Admitting: Emergency Medicine

## 2021-07-22 NOTE — Telephone Encounter (Signed)
Reaching out to patient to offer assistance regarding upcoming cardiac imaging study; pt verbalizes understanding of appt date/time, parking situation and where to check in, pre-test NPO status and medications ordered, and verified current allergies; name and call back number provided for further questions should they arise ?Evan Bond RN Navigator Cardiac Imaging ?[facility] ?[phone] office ?[phone] cell ? ?Denies iv issues ?HR 56 on phone (has PO metoprolol just in case) ?Arrival 730 ? ?

## 2021-07-23 ENCOUNTER — Ambulatory Visit (HOSPITAL_COMMUNITY)
Admission: RE | Admit: 2021-07-23 | Discharge: 2021-07-23 | Disposition: A | Discharge status: Home / Self Care | Payer: Medicare PPO | Source: Ambulatory Visit | Attending: Cardiology | Admitting: Cardiology

## 2021-07-23 ENCOUNTER — Other Ambulatory Visit: Payer: Self-pay

## 2021-07-23 DIAGNOSIS — I4891 Unspecified atrial fibrillation: Secondary | ICD-10-CM

## 2021-07-23 MED ORDER — IOHEXOL 350 MG/ML SOLN
100.0000 mL | Freq: Once | INTRAVENOUS | Status: AC | PRN
  Administered 2021-07-23: 100 mL via INTRAVENOUS

## 2021-07-29 ENCOUNTER — Encounter (HOSPITAL_COMMUNITY): Payer: Self-pay | Admitting: Cardiology

## 2021-07-29 NOTE — Pre-Procedure Instructions (Signed)
Instructed patient on the following items: Arrival time 0530 Nothing to eat or drink after midnight No meds AM of procedure Responsible person to drive you home and stay with you for 24 hrs  Have you missed any doses of anti-coagulant Eliquis- hasn't missed any doses    

## 2021-07-29 NOTE — Anesthesia Preprocedure Evaluation (Addendum)
Anesthesia Evaluation  ?Patient identified by MRN, date of birth, ID band ?Patient awake ? ? ? ?Reviewed: ?Allergy & Precautions, NPO status , Patient's Chart, lab work & pertinent test results, reviewed documented beta blocker date and time  ? ?Airway ?Mallampati: II ? ?TM Distance: >3 FB ?Neck ROM: Full ? ? ? Dental ? ?(+) Dental Advisory Given, Caps, Missing,  ?  ?Pulmonary ?sleep apnea and Continuous Positive Airway Pressure Ventilation ,  ?  ?Pulmonary exam normal ?breath sounds clear to auscultation ? ? ? ? ? ? Cardiovascular ?hypertension, Pt. on medications ?Normal cardiovascular exam+ dysrhythmias Atrial Fibrillation  ?Rhythm:Regular Rate:Normal ? ?Elevated coronary Ca+ score ?  ?Neuro/Psych ? Headaches, Anxiety Hx/o trigeminal neuralgia ? Neuromuscular disease   ? GI/Hepatic ?negative GI ROS, Neg liver ROS,   ?Endo/Other  ?hyperlipidemia ? Renal/GU ?Renal diseaseHx/o renal cyst  ? ?Hx/o prostate Ca S/P radical prostatectomy 2003 ? ?  ?Musculoskeletal ?negative musculoskeletal ROS ?(+)  ? Abdominal ?  ?Peds ? Hematology ? ?(+) Blood dyscrasia, , Eliquis therapy- last dose 3/30   ?Anesthesia Other Findings ? ? Reproductive/Obstetrics ? ?  ? ? ? ? ? ? ? ? ? ? ? ? ? ?  ?  ? ? ? ? ?Anesthesia Physical ?Anesthesia Plan ? ?ASA: 2 ? ?Anesthesia Plan: General  ? ?Post-op Pain Management:   ? ?Induction: Intravenous ? ?PONV Risk Score and Plan: 2 and Treatment may vary due to age or medical condition, Dexamethasone and Ondansetron ? ?Airway Management Planned: Oral ETT ? ?Additional Equipment:  ? ?Intra-op Plan:  ? ?Post-operative Plan: Extubation in OR ? ?Informed Consent: I have reviewed the patients History and Physical, chart, labs and discussed the procedure including the risks, benefits and alternatives for the proposed anesthesia with the patient or authorized representative who has indicated his/her understanding and acceptance.  ? ? ? ?Dental advisory given ? ?Plan  Discussed with: CRNA and Anesthesiologist ? ?Anesthesia Plan Comments:   ? ? ? ? ? ?Anesthesia Quick Evaluation ? ?

## 2021-07-30 ENCOUNTER — Encounter (HOSPITAL_COMMUNITY)
Admission: RE | Disposition: A | Discharge status: Home / Self Care | Payer: Self-pay | Source: Ambulatory Visit | Attending: Cardiology

## 2021-07-30 ENCOUNTER — Ambulatory Visit (HOSPITAL_COMMUNITY)
Admission: RE | Admit: 2021-07-30 | Discharge: 2021-07-30 | Disposition: A | Discharge status: Home / Self Care | Payer: Medicare PPO | Source: Ambulatory Visit | Attending: Cardiology | Admitting: Cardiology

## 2021-07-30 ENCOUNTER — Ambulatory Visit (HOSPITAL_COMMUNITY): Payer: Medicare PPO | Admitting: Certified Registered Nurse Anesthetist

## 2021-07-30 ENCOUNTER — Other Ambulatory Visit: Payer: Self-pay

## 2021-07-30 ENCOUNTER — Ambulatory Visit (HOSPITAL_BASED_OUTPATIENT_CLINIC_OR_DEPARTMENT_OTHER): Payer: Medicare PPO | Admitting: Certified Registered Nurse Anesthetist

## 2021-07-30 DIAGNOSIS — I10 Essential (primary) hypertension: Secondary | ICD-10-CM | POA: Diagnosis not present

## 2021-07-30 DIAGNOSIS — E785 Hyperlipidemia, unspecified: Secondary | ICD-10-CM | POA: Diagnosis not present

## 2021-07-30 DIAGNOSIS — Z7901 Long term (current) use of anticoagulants: Secondary | ICD-10-CM | POA: Insufficient documentation

## 2021-07-30 DIAGNOSIS — I4891 Unspecified atrial fibrillation: Secondary | ICD-10-CM

## 2021-07-30 DIAGNOSIS — G4733 Obstructive sleep apnea (adult) (pediatric): Secondary | ICD-10-CM

## 2021-07-30 DIAGNOSIS — I480 Paroxysmal atrial fibrillation: Secondary | ICD-10-CM | POA: Insufficient documentation

## 2021-07-30 DIAGNOSIS — Z79899 Other long term (current) drug therapy: Secondary | ICD-10-CM | POA: Diagnosis not present

## 2021-07-30 HISTORY — PX: ATRIAL FIBRILLATION ABLATION: EP1191

## 2021-07-30 LAB — POCT ACTIVATED CLOTTING TIME
Activated Clotting Time: 311 seconds
Activated Clotting Time: 359 seconds

## 2021-07-30 SURGERY — ATRIAL FIBRILLATION ABLATION
Anesthesia: General

## 2021-07-30 MED ORDER — HEPARIN SODIUM (PORCINE) 1000 UNIT/ML IJ SOLN
INTRAMUSCULAR | Status: DC | PRN
  Administered 2021-07-30: 3000 [IU] via INTRAVENOUS
  Administered 2021-07-30: 13000 [IU] via INTRAVENOUS

## 2021-07-30 MED ORDER — ONDANSETRON HCL 4 MG/2ML IJ SOLN: 4.0000 mg | Freq: Four times a day (QID) | INTRAMUSCULAR | Status: DC | PRN

## 2021-07-30 MED ORDER — PROPOFOL 10 MG/ML IV BOLUS
INTRAVENOUS | Status: DC | PRN
  Administered 2021-07-30: 120 mg via INTRAVENOUS

## 2021-07-30 MED ORDER — ONDANSETRON HCL 4 MG/2ML IJ SOLN
INTRAMUSCULAR | Status: DC | PRN
  Administered 2021-07-30: 4 mg via INTRAVENOUS

## 2021-07-30 MED ORDER — FENTANYL CITRATE (PF) 100 MCG/2ML IJ SOLN
INTRAMUSCULAR | Status: DC | PRN
Start: 2021-07-30 — End: 2021-07-30
  Administered 2021-07-30 (×2): 50 ug via INTRAVENOUS

## 2021-07-30 MED ORDER — HEPARIN SODIUM (PORCINE) 1000 UNIT/ML IJ SOLN
INTRAMUSCULAR | Status: AC
  Filled 2021-07-30: qty 10

## 2021-07-30 MED ORDER — EPHEDRINE SULFATE-NACL 50-0.9 MG/10ML-% IV SOSY
PREFILLED_SYRINGE | INTRAVENOUS | Status: DC | PRN
  Administered 2021-07-30: 10 mg via INTRAVENOUS

## 2021-07-30 MED ORDER — SODIUM CHLORIDE 0.9% FLUSH: 3.0000 mL | Freq: Two times a day (BID) | INTRAVENOUS | Status: DC

## 2021-07-30 MED ORDER — COLCHICINE 0.6 MG PO TABS
0.6000 mg | ORAL_TABLET | Freq: Two times a day (BID) | ORAL | Status: DC
  Administered 2021-07-30: 0.6 mg via ORAL
  Filled 2021-07-30: qty 1

## 2021-07-30 MED ORDER — PANTOPRAZOLE SODIUM 40 MG PO TBEC: 40.0000 mg | DELAYED_RELEASE_TABLET | Freq: Every day | ORAL | 0 refills | Status: DC

## 2021-07-30 MED ORDER — FLECAINIDE ACETATE 50 MG PO TABS
50.0000 mg | ORAL_TABLET | Freq: Two times a day (BID) | ORAL | Status: DC
  Administered 2021-07-30: 50 mg via ORAL
  Filled 2021-07-30: qty 1

## 2021-07-30 MED ORDER — DEXAMETHASONE SODIUM PHOSPHATE 10 MG/ML IJ SOLN
INTRAMUSCULAR | Status: DC | PRN
Start: 2021-07-30 — End: 2021-07-30
  Administered 2021-07-30: 10 mg via INTRAVENOUS

## 2021-07-30 MED ORDER — ROCURONIUM BROMIDE 10 MG/ML (PF) SYRINGE
PREFILLED_SYRINGE | INTRAVENOUS | Status: DC | PRN
  Administered 2021-07-30: 50 mg via INTRAVENOUS

## 2021-07-30 MED ORDER — SODIUM CHLORIDE 0.9 % IV SOLN: 250.0000 mL | INTRAVENOUS | Status: DC | PRN

## 2021-07-30 MED ORDER — LIDOCAINE 2% (20 MG/ML) 5 ML SYRINGE
INTRAMUSCULAR | Status: DC | PRN
Start: 2021-07-30 — End: 2021-07-30
  Administered 2021-07-30: 80 mg via INTRAVENOUS

## 2021-07-30 MED ORDER — ACETAMINOPHEN 325 MG PO TABS
650.0000 mg | ORAL_TABLET | ORAL | Status: DC | PRN
  Filled 2021-07-30: qty 2

## 2021-07-30 MED ORDER — SODIUM CHLORIDE 0.9 % IV SOLN: INTRAVENOUS | Status: DC

## 2021-07-30 MED ORDER — PROTAMINE SULFATE 10 MG/ML IV SOLN
INTRAVENOUS | Status: DC | PRN
  Administered 2021-07-30: 35 mg via INTRAVENOUS

## 2021-07-30 MED ORDER — SUGAMMADEX SODIUM 200 MG/2ML IV SOLN
INTRAVENOUS | Status: DC | PRN
Start: 2021-07-30 — End: 2021-07-30
  Administered 2021-07-30: 200 mg via INTRAVENOUS

## 2021-07-30 MED ORDER — COLCHICINE 0.6 MG PO TABS: 0.6000 mg | ORAL_TABLET | Freq: Two times a day (BID) | ORAL | 0 refills | Status: DC

## 2021-07-30 MED ORDER — HEPARIN (PORCINE) IN NACL 1000-0.9 UT/500ML-% IV SOLN
INTRAVENOUS | Status: DC | PRN
  Administered 2021-07-30 (×4): 500 mL

## 2021-07-30 MED ORDER — APIXABAN 5 MG PO TABS
5.0000 mg | ORAL_TABLET | Freq: Two times a day (BID) | ORAL | Status: DC
  Administered 2021-07-30: 5 mg via ORAL
  Filled 2021-07-30: qty 1

## 2021-07-30 MED ORDER — ISOPROTERENOL HCL 0.2 MG/ML IJ SOLN
INTRAMUSCULAR | Status: AC
  Filled 2021-07-30: qty 5

## 2021-07-30 MED ORDER — HEPARIN SODIUM (PORCINE) 1000 UNIT/ML IJ SOLN
INTRAMUSCULAR | Status: DC | PRN
  Administered 2021-07-30: 1000 [IU] via INTRAVENOUS

## 2021-07-30 MED ORDER — ISOPROTERENOL HCL 0.2 MG/ML IJ SOLN
INTRAVENOUS | Status: DC | PRN
  Administered 2021-07-30: 4 ug/min via INTRAVENOUS

## 2021-07-30 MED ORDER — DILTIAZEM HCL ER COATED BEADS 120 MG PO CP24
120.0000 mg | ORAL_CAPSULE | Freq: Every day | ORAL | Status: DC
  Administered 2021-07-30: 120 mg via ORAL
  Filled 2021-07-30: qty 1

## 2021-07-30 MED ORDER — PANTOPRAZOLE SODIUM 40 MG PO TBEC
40.0000 mg | DELAYED_RELEASE_TABLET | Freq: Every day | ORAL | Status: DC
  Administered 2021-07-30: 40 mg via ORAL
  Filled 2021-07-30: qty 1

## 2021-07-30 MED ORDER — SODIUM CHLORIDE 0.9% FLUSH: 3.0000 mL | INTRAVENOUS | Status: DC | PRN

## 2021-07-30 MED ORDER — PHENYLEPHRINE HCL-NACL 20-0.9 MG/250ML-% IV SOLN
INTRAVENOUS | Status: DC | PRN
  Administered 2021-07-30: 40 ug/min via INTRAVENOUS

## 2021-07-30 SURGICAL SUPPLY — 19 items
BLANKET WARM UNDERBOD FULL ACC (MISCELLANEOUS) ×2 IMPLANT
CATH 8FR REPROCESSED SOUNDSTAR (CATHETERS) ×2 IMPLANT
CATH 8FR SOUNDSTAR REPROCESSED (CATHETERS) IMPLANT
CATH OCTARAY 2.0 F 3-3-3-3-3 (CATHETERS) ×1 IMPLANT
CATH S CIRCA THERM PROBE 10F (CATHETERS) ×1 IMPLANT
CATH SMTCH THERMOCOOL SF DF (CATHETERS) ×1 IMPLANT
CATH WEB BI DIR CSDF CRV REPRO (CATHETERS) ×1 IMPLANT
CLOSURE PERCLOSE PROSTYLE (VASCULAR PRODUCTS) ×3 IMPLANT
COVER SWIFTLINK CONNECTOR (BAG) ×2 IMPLANT
PACK EP LATEX FREE (CUSTOM PROCEDURE TRAY) ×2
PACK EP LF (CUSTOM PROCEDURE TRAY) ×1 IMPLANT
PAD DEFIB RADIO PHYSIO CONN (PAD) ×2 IMPLANT
PATCH CARTO3 (PAD) ×1 IMPLANT
SHEATH BAYLIS TRANSSEPTAL 98CM (NEEDLE) ×1 IMPLANT
SHEATH CARTO VIZIGO SM CVD (SHEATH) ×1 IMPLANT
SHEATH PINNACLE 8F 10CM (SHEATH) ×2 IMPLANT
SHEATH PINNACLE 9F 10CM (SHEATH) ×1 IMPLANT
SHEATH PROBE COVER 6X72 (BAG) ×1 IMPLANT
TUBING SMART ABLATE COOLFLOW (TUBING) ×1 IMPLANT

## 2021-07-30 NOTE — Transfer of Care (Signed)
Immediate Anesthesia Transfer of Care Note ? ?Patient: Evan Robinson ? ?Procedure(s) Performed: ATRIAL FIBRILLATION ABLATION ? ?Patient Location: Cath Lab ? ?Anesthesia Type:General ? ?Level of Consciousness: awake, alert  and oriented ? ?Airway & Oxygen Therapy: Patient Spontanous Breathing and Patient connected to nasal cannula oxygen ? ?Post-op Assessment: Report given to RN and Post -op Vital signs reviewed and stable ? ?Post vital signs: Reviewed and stable ? ?Last Vitals:  ?Vitals Value Taken Time  ?BP 135/78   ?Temp    ?Pulse 69 07/30/21 0957  ?Resp 12 07/30/21 0957  ?SpO2 98 % 07/30/21 0957  ?Vitals shown include unvalidated device data. ? ?Last Pain:  ?Vitals:  ? 07/30/21 0544  ?TempSrc: Oral  ?   ? ?  ? ?Complications: No notable events documented. ?

## 2021-07-30 NOTE — H&P (Signed)
?Electrophysiology Office Note:   ?  ?Date:  07/30/2021  ?  ?ID:  Evan Robinson, DOB 1946-01-20, MRN [id number] ?  ?PCP:  Evan Dine, MD          ?[facility] Cardiologist:  None  ?[facility] Electrophysiologist:  Vickie Epley, MD  ?  ?Referring MD: Sherran Needs, NP  ?  ?Chief Complaint: AF ?  ?History of Present Illness:   ?  ?Evan Robinson is a 76 y.o. male who presents for an evaluation of AF at the request of Evan Devoid, NP. Their medical history includes pAF on eliquis, ascending aortic dilation, OSA, prostate cancer hx. He previously was prescribed flecainide for his AF. He met with Butch Penny 03/24/2021 and expressed a desire to pursue AF ablation. This was after a visit to the ER with palpitations that were worse over the preceding several weeks.  ?Today he is with his wife in clinic. He was previously evaluated at [facility] in [place] for the same. Catheter ablation was discussed.  ?  ? Presents for PVI. No problems with blood thinners.  ?  ?Objective  ?  ?    ?Past Medical History:  ?Diagnosis Date  ? Dyslipidemia    ? H/O prostate cancer 2003  ?  s/p radical prostatectomy (uro Dr. Rush Farmer in [place] yearly)  ? Headache 2012  ?  thorough w/u - unrevealing.  has seen 3 neurologists, MRI, LP nonacute.  ESR = 3  ? HTN (hypertension)    ? Hypogonadism male    ? Low testosterone    ? Migraines    ? OSA on CPAP    ? Renal cyst 12/2010  ?  complex cyst upper pole L kidney with no malignancy by MRI  ? Vitamin D deficiency    ?  ?  ?     ?Past Surgical History:  ?Procedure Laterality Date  ? CHOLECYSTECTOMY   2006  ? COLONOSCOPY WITH PROPOFOL N/A 02/01/2017  ?  Procedure: COLONOSCOPY WITH PROPOFOL;  Surgeon: Toledo, Benay Pike, MD;  Location: [facility];  Service: Gastroenterology;  Laterality: N/A;  ? ESOPHAGOGASTRODUODENOSCOPY (EGD) WITH PROPOFOL N/A 02/01/2017  ?  Procedure: ESOPHAGOGASTRODUODENOSCOPY (EGD) WITH PROPOFOL;  Surgeon: Toledo, Benay Pike, MD;  Location: [facility];   Service: Gastroenterology;  Laterality: N/A;  ? HERNIA REPAIR   08/2010  ? PROSTATECTOMY   2003  ?  ?  ?Current Medications: ?Active Medications  ?    ?Current Meds  ?Medication Sig  ? ALPRAZolam (XANAX) 0.5 MG tablet Take 0.5 mg by mouth 2 (two) times daily as needed.  ? cholecalciferol (VITAMIN D) 1000 UNITS tablet Take 2,000 Units by mouth daily.  ? diltiazem (CARDIZEM CD) 120 MG 24 hr capsule Take 120 mg by mouth daily.  ? diltiazem (CARDIZEM) 30 MG tablet Take 1 Tablet Every 4 Hours As Needed For HR >100 and Top BP>100  ? ELIQUIS 5 MG TABS tablet Take 5 mg by mouth 2 (two) times daily.  ? flecainide (TAMBOCOR) 50 MG tablet Take 50 mg by mouth 2 (two) times daily.  ? gemfibrozil (LOPID) 600 MG tablet Take 600 mg by mouth daily before breakfast.  ? hydrochlorothiazide (HYDRODIURIL) 25 MG tablet Take 25 mg by mouth daily.  ? HYDROcodone-acetaminophen (NORCO/VICODIN) 5-325 MG per tablet Take 1 tablet by mouth every 6 (six) hours as needed (headache).  ? losartan (COZAAR) 25 MG tablet Take 25 mg by mouth daily.  ? Multiple Vitamin (MULTIVITAMIN) tablet Take 1 tablet by mouth daily.  ?  omeprazole (PRILOSEC) 20 MG capsule Take 20 mg by mouth daily.  ? pregabalin (LYRICA) 100 MG capsule TAKE 1 CAPSULE(100 MG) BY MOUTH THREE TIMES DAILY  ? pregabalin (LYRICA) 50 MG capsule TAKE 1 CAPSULE(50 MG) BY MOUTH DAILY as needed for breakthrough pain  ? vitamin B-12 (CYANOCOBALAMIN) 1000 MCG tablet Take 1 tablet (1,000 mcg total) by mouth daily.  ? zolpidem (AMBIEN CR) 12.5 MG CR tablet TAKE 1 TABLET(12.5 MG) BY MOUTH EVERY NIGHT  ?  ?  ?  ?Allergies:   Fenofibrate  ?  ?Social History  ?  ?     ?Socioeconomic History  ? Marital status: Married  ?    Spouse name: Not on file  ? Number of children: Not on file  ? Years of education: Not on file  ? Highest education level: Not on file  ?Occupational History  ? Not on file  ?Tobacco Use  ? Smoking status: Never  ? Smokeless tobacco: Never  ?Vaping Use  ? Vaping Use: Never used   ?Substance and Sexual Activity  ? Alcohol use: No  ? Drug use: No  ? Sexual activity: Not on file  ?Other Topics Concern  ? Not on file  ?Social History Narrative  ?  "Gene"  ?  Lives with wife  ?  Occupation: retired, was Engineer, maintenance (IT)  ?  Edu:BS  ?  Activity: walks daily, some gym  ?  Diet:   ?  ?Social Determinants of Health  ?  ?Financial Resource Strain: Not on file  ?Food Insecurity: Not on file  ?Transportation Needs: Not on file  ?Physical Activity: Not on file  ?Stress: Not on file  ?Social Connections: Not on file  ?  ?  ?Family History: ?The patient's family history includes Alcohol abuse in his father and paternal grandfather; CAD in his father; Cancer in his father; Stroke in his mother. There is no history of Diabetes. ?  ?ROS:   ?Please see the history of present illness.    ?All other systems reviewed and are negative. ?  ?EKGs/Labs/Other Studies Reviewed:   ?  ?The following studies were reviewed today: ?  ?May 28, 2021 echo - EF50/mildly reduced, RV normal, mild-moderate MR (personally reviewed) ?  ?EKG:  The ekg ordered today demonstrates sinus rhythm, low amplitude limb lead QRS ?  ?  ?Recent Labs: ?03/21/2021: ALT 12; B Natriuretic Peptide 102.8; BUN 17; Creatinine, Ser 0.89; Hemoglobin 14.0; Magnesium 1.7; Platelets 154; Potassium 3.6; Sodium 135  ?Recent Lipid Panel ?Labs (Brief)  ?     ?   ?Component Value Date/Time  ?  CHOL 202 (A) 07/02/2012 0000  ?  TRIG 321 07/02/2012 0000  ?  HDL 37 07/02/2012 0000  ?  LDLDIRECT 101 07/02/2012 0000  ?  ?  ?  ?Physical Exam:   ?  ?VS:  BP 174/86   Pulse 66   Ht 6' (1.829 m)   Wt 194 lb 3.2 oz (88.1 kg)   SpO2 98%   BMI 26.34 kg/m?    ?  ?   ?Wt Readings from Last 3 Encounters:  ?05/11/21 194 lb 3.2 oz (88.1 kg)  ?03/24/21 193 lb 12.8 oz (87.9 kg)  ?02/12/21 193 lb 9.6 oz (87.8 kg)  ?  ?  ?GEN:  Well nourished, well developed in no acute distress ?HEENT: Normal ?NECK: No JVD; No carotid bruits ?LYMPHATICS: No lymphadenopathy ?CARDIAC: RRR, no murmurs, rubs,  gallops ?RESPIRATORY:  Clear to auscultation without rales, wheezing or rhonchi  ?ABDOMEN: Soft, non-tender, non-distended ?  MUSCULOSKELETAL:  No edema; No deformity  ?SKIN: Warm and dry ?NEUROLOGIC:  Alert and oriented x 3 ?PSYCHIATRIC:  Normal affect  ?  ?  ?  ?  ?Assessment ?  ?  ?ASSESSMENT:   ?  ?1. Paroxysmal atrial fibrillation (HCC)   ?2. OSA on CPAP   ?3. Primary hypertension   ?  ?PLAN:   ?  ?In order of problems listed above: ?  ?#Paroxysmal AF ?Symptomatic. Continued episodes despite treatment with flecainide and diltiazem. On eliquis for stroke prophylaxis. ?  ?I discussed treatment options for his AF including antiarrhythmic drug therapy and catheter ablation. We discussed risks, recovery from ablation and compared success rates between catheter ablation and antiarrhythmic drug therapy. He would like to proceed with catheter ablation. ?  ?  ?Risk, benefits, and alternatives to EP study and radiofrequency ablation for afib were also discussed in detail today. These risks include but are not limited to stroke, bleeding, vascular damage, tamponade, perforation, damage to the esophagus, lungs, and other structures, pulmonary vein stenosis, worsening renal function, and death. The patient understands these risk and wishes to proceed.  We will therefore proceed with catheter ablation at the next available time.  Carto, ICE, anesthesia are requested for the procedure.  Will also obtain CT PV protocol prior to the procedure to exclude LAA thrombus and further evaluate atrial anatomy. ?  ?   ?  ?Signed, ?Lysbeth Galas T. Quentin Ore, MD, [facility] [place] ?07/30/2021 ?Electrophysiology ?[place] ?  ?

## 2021-07-30 NOTE — Anesthesia Postprocedure Evaluation (Signed)
Anesthesia Post Note ? ?Patient: Evan Robinson ? ?Procedure(s) Performed: ATRIAL FIBRILLATION ABLATION ? ?  ? ?Patient location during evaluation: PACU ?Anesthesia Type: General ?Level of consciousness: awake and alert and oriented ?Pain management: pain level controlled ?Vital Signs Assessment: post-procedure vital signs reviewed and stable ?Respiratory status: spontaneous breathing, nonlabored ventilation and respiratory function stable ?Cardiovascular status: blood pressure returned to baseline and stable ?Postop Assessment: no apparent nausea or vomiting ?Anesthetic complications: no ? ? ?No notable events documented. ? ?Last Vitals:  ?Vitals:  ? 07/30/21 1030 07/30/21 1031  ?BP: 123/61   ?Pulse: 64 63  ?Resp: 16 15  ?Temp:  36.4 ?C  ?SpO2: 97% 96%  ?  ?Last Pain:  ?Vitals:  ? 07/30/21 1031  ?TempSrc: Temporal  ?PainSc:   ? ? ?  ?  ?  ?  ?  ?  ? ?Montford Barg A. ? ? ? ? ?

## 2021-07-30 NOTE — Anesthesia Procedure Notes (Signed)
Procedure Name: Intubation ?Date/Time: 07/30/2021 7:47 AM ?Performed by: Pearson Grippe, CRNA ?Pre-anesthesia Checklist: Patient identified, Emergency Drugs available, Suction available and Patient being monitored ?Patient Re-evaluated:Patient Re-evaluated prior to induction ?Oxygen Delivery Method: Circle system utilized ?Preoxygenation: Pre-oxygenation with 100% oxygen ?Induction Type: IV induction ?Ventilation: Mask ventilation without difficulty and Oral airway inserted - appropriate to patient size ?Laryngoscope Size: Hyacinth Meeker and 2 ?Grade View: Grade III ?Tube type: Oral ?Tube size: 7.5 mm ?Number of attempts: 1 ?Airway Equipment and Method: Stylet and Oral airway ?Placement Confirmation: ETT inserted through vocal cords under direct vision, positive ETCO2 and breath sounds checked- equal and bilateral ?Secured at: 23 cm ?Tube secured with: Tape ?Dental Injury: Teeth and Oropharynx as per pre-operative assessment  ? ? ? ? ?

## 2021-07-30 NOTE — Progress Notes (Signed)
Pt ambulated without difficulty or bleeding.    Dr. Lalla Brothers saw pt post procedure. Pt Discharged home with wife who will drive and stay with pt x 24 hrs  ?

## 2021-07-30 NOTE — Discharge Instructions (Signed)

## 2021-08-02 ENCOUNTER — Encounter (HOSPITAL_COMMUNITY): Payer: Self-pay | Admitting: Cardiology

## 2021-08-26 ENCOUNTER — Encounter: Payer: Medicare PPO | Attending: Physical Medicine and Rehabilitation | Admitting: Physical Medicine and Rehabilitation

## 2021-08-26 VITALS — BP 135/79 | HR 65 | Ht 72.0 in | Wt 198.0 lb

## 2021-08-26 DIAGNOSIS — M792 Neuralgia and neuritis, unspecified: Secondary | ICD-10-CM | POA: Diagnosis present

## 2021-08-26 DIAGNOSIS — R2689 Other abnormalities of gait and mobility: Secondary | ICD-10-CM | POA: Diagnosis present

## 2021-08-26 MED ORDER — PREGABALIN 100 MG PO CAPS: 100.0000 mg | ORAL_CAPSULE | Freq: Two times a day (BID) | ORAL | 3 refills | Status: DC

## 2021-08-26 MED ORDER — PREGABALIN 100 MG PO CAPS: 100.0000 mg | ORAL_CAPSULE | Freq: Three times a day (TID) | ORAL | 3 refills | Status: DC

## 2021-08-26 NOTE — Progress Notes (Signed)
? ?Subjective:  ? ? Patient ID: Evan Robinson, male    DOB: August 10, 1945, 76 y.o.   MRN: [id number] ? ?HPI: Evan Robinson is a 76 y.o. male who presents for follow-up of facial pain and shoulder pain.  ? ?He returns for follow-up appointment for chronic pain and medication refill. He reports he has facial pain and neck pain radiating into his bilateral shoulders He rates his pain 7.His current exercise regime is walking and attending physical therapy two days a week.  ? ?His pain has been stable since last visit ?He has burning in left sided facial pain and shoulder ?It is worst with friction and exacerbated with lying down.  ?He has been on the Lyrica for years. He is currently 170m BID and he is able to tolerate this dose well. He had dizziness with higher doses ?He has not had cognitive side effects ?He did well with the memory recall.  ?He has never tried CBD oil.  ?Pain worsens with shaving ?He tried aloe vera without great benefit ?He is excited to try Qutenza today- it gave him about 1 month of relief last visit.  ?He is not sure if he has tried carbamazepine ?Capsaicin made his skin burn and raw in the past.  ?-He is interested in trying medical marijuana if it gets approved.  ? ?  ?Pain Inventory ?Average Pain 8 ?Pain Right Now 7 ?My pain is burning ? ?In the last 24 hours, has pain interfered with the following? ?General activity 7 ?Relation with others 8 ?Enjoyment of life 7 ?What TIME of day is your pain at its worst? evening and night ?Sleep (in general) Fair ? ?Pain is worse with: sitting ?Pain improves with: medication ?Relief from Meds: 4 ? ?Family History  ?Problem Relation Age of Onset  ? Alcohol abuse Father   ? Cancer Father   ?     prostate  ? CAD Father   ?     possible MI  ? Alcohol abuse Paternal Grandfather   ? Stroke Mother   ? Diabetes Neg Hx   ? ?Social History  ? ?Socioeconomic History  ? Marital status: Married  ?  Spouse name: Not on file  ? Number of children: Not on file  ? Years of  education: Not on file  ? Highest education level: Not on file  ?Occupational History  ? Not on file  ?Tobacco Use  ? Smoking status: Never  ? Smokeless tobacco: Never  ?Vaping Use  ? Vaping Use: Never used  ?Substance and Sexual Activity  ? Alcohol use: No  ? Drug use: No  ? Sexual activity: Not on file  ?Other Topics Concern  ? Not on file  ?Social History Narrative  ? "Gene"  ? Lives with wife  ? Occupation: retired, was CEngineer, maintenance ([employer]) ? Edu:BS  ? Activity: walks daily, some gym  ? Diet:   ? ?Social Determinants of Health  ? ?Financial Resource Strain: Not on file  ?Food Insecurity: Not on file  ?Transportation Needs: Not on file  ?Physical Activity: Not on file  ?Stress: Not on file  ?Social Connections: Not on file  ? ?Past Surgical History:  ?Procedure Laterality Date  ? ATRIAL FIBRILLATION ABLATION N/A 07/30/2021  ? Procedure: ATRIAL FIBRILLATION ABLATION;  Surgeon: LVickie Epley MD;  [facility];  Service: Cardiovascular;  Laterality: N/A;  ? CHOLECYSTECTOMY  2006  ? COLONOSCOPY WITH PROPOFOL N/A 02/01/2017  ? Procedure: COLONOSCOPY WITH PROPOFOL;  Surgeon: TMount Union TBirmingham  MD;  Location: [facility];  Service: Gastroenterology;  Laterality: N/A;  ? ESOPHAGOGASTRODUODENOSCOPY (EGD) WITH PROPOFOL N/A 02/01/2017  ? Procedure: ESOPHAGOGASTRODUODENOSCOPY (EGD) WITH PROPOFOL;  Surgeon: Toledo, Benay Pike, MD;  Location: [facility];  Service: Gastroenterology;  Laterality: N/A;  ? HERNIA REPAIR  08/2010  ? PROSTATECTOMY  2003  ? ?Past Surgical History:  ?Procedure Laterality Date  ? ATRIAL FIBRILLATION ABLATION N/A 07/30/2021  ? Procedure: ATRIAL FIBRILLATION ABLATION;  Surgeon: Vickie Epley, MD;  Location: [facility];  Service: Cardiovascular;  Laterality: N/A;  ? CHOLECYSTECTOMY  2006  ? COLONOSCOPY WITH PROPOFOL N/A 02/01/2017  ? Procedure: COLONOSCOPY WITH PROPOFOL;  Surgeon: Toledo, Benay Pike, MD;  Location: [facility];  Service: Gastroenterology;  Laterality: N/A;  ?  ESOPHAGOGASTRODUODENOSCOPY (EGD) WITH PROPOFOL N/A 02/01/2017  ? Procedure: ESOPHAGOGASTRODUODENOSCOPY (EGD) WITH PROPOFOL;  Surgeon: Toledo, Benay Pike, MD;  Location: [facility];  Service: Gastroenterology;  Laterality: N/A;  ? HERNIA REPAIR  08/2010  ? PROSTATECTOMY  2003  ? ?Past Medical History:  ?Diagnosis Date  ? Dyslipidemia   ? H/O prostate cancer 2003  ? s/p radical prostatectomy (uro Dr. Rush Farmer in [place] yearly)  ? Headache 2012  ? thorough w/u - unrevealing.  has seen 3 neurologists, MRI, LP nonacute.  ESR = 3  ? HTN (hypertension)   ? Hypogonadism male   ? Low testosterone   ? Migraines   ? OSA on CPAP   ? Renal cyst 12/2010  ? complex cyst upper pole L kidney with no malignancy by MRI  ? Vitamin D deficiency   ? ?There were no vitals taken for this visit. ? ?Opioid Risk Score:   ?Fall Risk Score:  `1 ? ?Depression screen PHQ 2/9 ? ? ?  06/01/2021  ?  9:08 AM 02/12/2021  ? 11:14 AM 11/27/2020  ?  9:57 AM 08/25/2020  ?  9:14 AM 06/09/2020  ?  8:58 AM 05/11/2020  ?  9:04 AM 01/30/2020  ?  9:16 AM  ?Depression screen PHQ 2/9  ?Decreased Interest 0 0 0 0 0 0 0  ?Down, Depressed, Hopeless 0 0 0 0 0  0  ?PHQ - 2 Score 0 0 0 0 0 0 0  ?  ? ? ?Review of Systems  ?Constitutional: Negative.   ?HENT: Negative.    ?Eyes: Negative.   ?Respiratory: Negative.    ?Cardiovascular: Negative.   ?Gastrointestinal: Negative.   ?Endocrine: Negative.   ?Musculoskeletal:  Positive for neck pain.  ?     Nerve pain , pain in shoulders ,   ?Skin: Negative.   ?Allergic/Immunologic: Negative.   ?Neurological: Negative.   ?Hematological: Negative.   ?Psychiatric/Behavioral: Negative.    ? ?   ?Objective:  ? Physical Exam ?Vitals and nursing note reviewed. BMI 26.85 ?Constitutional:   ?   Appearance: Normal appearance.  ?Cardiovascular:  ?   Rate and Rhythm: Normal rate and regular rhythm.  ?   Pulses: Normal pulses.  ?   Heart sounds: Normal heart sounds.  ?Pulmonary:  ?   Effort: Pulmonary effort is normal.  ?   Breath sounds: Normal  breath sounds.  ?Musculoskeletal:  ?   Cervical back: Normal range of motion and neck supple.  ?   Comments: Normal Muscle Bulk and Muscle Testing Reveals:  ?Upper Extremities: Full ROM and Muscle Strength  5/5 ?Lower Extremities: Full ROM and Muscle Strength 5/5 ?Arises from chair with ease ?Narrow Based  Gait  ?Tight cervical myofascia ?Forward posture ?Skin: ?  General: Skin is warm and dry. No open lesions over back or shoulders.  ?Neurological:  ?   Mental Status: He is alert and oriented to person, place, and time.  ?Psychiatric:     ?   Mood and Affect: Mood normal.     ?   Behavior: Behavior normal.  ? ? ? ? ?   ?Assessment & Plan:  ? ?1. 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            MRI from 10/2017 reviewed, revealing cervical cord compression. MRI brain relatively unremarkable ?            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 ?            Lidoderm patch/ointment, Zyrtec, Tomapax, Almond milk, Elavil, Ketamine infusion (cound not tolerate), hypotherapy, compound medication, hypnosis without benefit ?            Unable to trial carbamazepine as patient is on Eliquis ?            Steroids made symptoms more severe ?            Side effects with Cymbalta, oxcarbazepine, Pamelor ?            CompletedPhysical Therapy two days a week. Continue HEP ?            ContinueLyrica 100 mg TID and Lyrica 50 mg daily as needed for Break through Pain. Continue HEP as Tolerated and Continue to Monitor.  ? Recommend topical CBD oil- discussed its benefits in reducing inflammation, pain, insomnia, and anxiety.  ?-Discussed that CBD oil differs from marijuana in that it does not contain THC- the substance that causes euphoria.  ?-Discussed that it is made from the hemp plant.  ?-It has been used for thousands of years ?-preliminary research suggests that if may be able to shrink  cancerous tumors, stop plaque formation in Alzheimer's Disease, and slow the progress of brain disease from concussions.  ?-Additional benefits that have been demonstrated in studies include improved nausea, indigesti

## 2021-08-31 ENCOUNTER — Encounter (HOSPITAL_COMMUNITY): Payer: Self-pay | Admitting: Nurse Practitioner

## 2021-08-31 ENCOUNTER — Ambulatory Visit (HOSPITAL_COMMUNITY)
Admission: RE | Admit: 2021-08-31 | Discharge: 2021-08-31 | Disposition: A | Discharge status: Home / Self Care | Payer: Medicare PPO | Source: Ambulatory Visit | Attending: Nurse Practitioner | Admitting: Nurse Practitioner

## 2021-08-31 VITALS — BP 138/72 | HR 57 | Ht 72.0 in | Wt 200.0 lb

## 2021-08-31 DIAGNOSIS — G501 Atypical facial pain: Secondary | ICD-10-CM | POA: Diagnosis not present

## 2021-08-31 DIAGNOSIS — D6869 Other thrombophilia: Secondary | ICD-10-CM | POA: Diagnosis not present

## 2021-08-31 DIAGNOSIS — Z8546 Personal history of malignant neoplasm of prostate: Secondary | ICD-10-CM | POA: Diagnosis not present

## 2021-08-31 DIAGNOSIS — I10 Essential (primary) hypertension: Secondary | ICD-10-CM | POA: Diagnosis not present

## 2021-08-31 DIAGNOSIS — I480 Paroxysmal atrial fibrillation: Secondary | ICD-10-CM | POA: Diagnosis not present

## 2021-08-31 DIAGNOSIS — G8929 Other chronic pain: Secondary | ICD-10-CM | POA: Insufficient documentation

## 2021-08-31 DIAGNOSIS — E785 Hyperlipidemia, unspecified: Secondary | ICD-10-CM | POA: Diagnosis not present

## 2021-08-31 DIAGNOSIS — G4733 Obstructive sleep apnea (adult) (pediatric): Secondary | ICD-10-CM | POA: Insufficient documentation

## 2021-08-31 DIAGNOSIS — Z7901 Long term (current) use of anticoagulants: Secondary | ICD-10-CM | POA: Insufficient documentation

## 2021-08-31 NOTE — Progress Notes (Signed)
? ?Primary Care Physician: Henrietta Dine, MD ?Referring Physician: [facility] ER f/u  ? ? ?Evan Robinson is a 76 y.o. male with a h/o hypertension, dyslipidemia, chronic facial pain with flushing, burning and allodynia, persistent (now paroxysmal) atrial fibrillatoin CHADSVasc 3 on eliquis, diltiazem, flecainide (Dr. Domenica Fail [facility]) episode of sinus bradycardia, aortic root/ascending aorta dilation, OSA , history of prostate cancer, who presents with labile heart rates, palpitation and fatigue. CHA2DS2VASc  score of 3 on eliquis.  ? ?He presented to [facility] ER with more persistent palpitations, elevation of HR over a few weeks.  He was in SR. He did have a viral infection, possible dehydration last week. He was d/c from  ER without ay changes. Referred here as he wanted to establish care in [place].  ? ?He is in SR today. He is wanting to pursue ablation. This is the first time he has had  breakthrough afib since he has been on flecainide x 2 years. He is very symptomatic in afib. His Fitbit did document some HR's up to 140 bpm, but not sustained.  He is on flecainide 50 mg bid but has first degree block so would prefer not to increase flecainide. He is also on cardizem daily but HR is in the 50's during the day and 40's at night with his Fitbit, so would prefer not to increase rate control.  ? ?He is active, uses CPAP for OSA, no tobacco or alcohol use, minimal caffeine use.  ? ?F/u in afib clinic 5/2 after afib ablation 07/12/21. He is maintaining SR, continues on flecainide 50 mg bid.   No swallowing or groin issues. Continues on eliquis 5 mg bid, without interruption. He is back to baseline with his activities.  ? ?Today, he denies symptoms of palpitations, chest pain, shortness of breath, orthopnea, PND, lower extremity edema, dizziness, presyncope, syncope, or neurologic sequela. The patient is tolerating medications without difficulties and is otherwise without complaint today.  ? ?Past Medical History:   ?Diagnosis Date  ? Dyslipidemia   ? H/O prostate cancer 2003  ? s/p radical prostatectomy (uro Dr. Rush Farmer in [place] yearly)  ? Headache 2012  ? thorough w/u - unrevealing.  has seen 3 neurologists, MRI, LP nonacute.  ESR = 3  ? HTN (hypertension)   ? Hypogonadism male   ? Low testosterone   ? Migraines   ? OSA on CPAP   ? Renal cyst 12/2010  ? complex cyst upper pole L kidney with no malignancy by MRI  ? Vitamin D deficiency   ? ?Past Surgical History:  ?Procedure Laterality Date  ? ATRIAL FIBRILLATION ABLATION N/A 07/30/2021  ? Procedure: ATRIAL FIBRILLATION ABLATION;  Surgeon: Vickie Epley, MD;  Location: [place] [facility];  Service: Cardiovascular;  Laterality: N/A;  ? CHOLECYSTECTOMY  2006  ? COLONOSCOPY WITH PROPOFOL N/A 02/01/2017  ? Procedure: COLONOSCOPY WITH PROPOFOL;  Surgeon: Toledo, Benay Pike, MD;  Location: [facility];  Service: Gastroenterology;  Laterality: N/A;  ? ESOPHAGOGASTRODUODENOSCOPY (EGD) WITH PROPOFOL N/A 02/01/2017  ? Procedure: ESOPHAGOGASTRODUODENOSCOPY (EGD) WITH PROPOFOL;  Surgeon: Toledo, Benay Pike, MD;  Location: [facility];  Service: Gastroenterology;  Laterality: N/A;  ? HERNIA REPAIR  08/2010  ? PROSTATECTOMY  2003  ? ? ?Current Outpatient Medications  ?Medication Sig Dispense Refill  ? ALPRAZolam (XANAX) 0.5 MG tablet Take 0.5 mg by mouth 2 (two) times daily as needed for anxiety.    ? b complex vitamins capsule Take 1 capsule by mouth daily.    ?  Carboxymethylcellul-Glycerin (CLEAR EYES FOR DRY EYES OP) Place 1 drop into both eyes daily as needed (Dry eyes).    ? Cholecalciferol (VITAMIN D) 50 MCG (2000 UT) CAPS Take 2,000 Units by mouth daily.    ? diltiazem (CARDIZEM CD) 120 MG 24 hr capsule Take 120 mg by mouth daily.    ? diltiazem (CARDIZEM) 30 MG tablet Take 1 Tablet Every 4 Hours As Needed For HR >100 and Top BP>100 45 tablet 1  ? ELIQUIS 5 MG TABS tablet Take 5 mg by mouth 2 (two) times daily.    ? flecainide (TAMBOCOR) 50 MG tablet Take 50 mg by mouth 2  (two) times daily.    ? gemfibrozil (LOPID) 600 MG tablet Take 600 mg by mouth daily before breakfast.    ? hydrochlorothiazide (HYDRODIURIL) 25 MG tablet Take 25 mg by mouth daily.    ? HYDROcodone-acetaminophen (NORCO/VICODIN) 5-325 MG per tablet Take 1 tablet by mouth every 6 (six) hours as needed (headache).    ? losartan (COZAAR) 25 MG tablet Take 25 mg by mouth daily.    ? pantoprazole (PROTONIX) 40 MG tablet Take 1 tablet (40 mg total) by mouth daily. 45 tablet 0  ? pregabalin (LYRICA) 100 MG capsule Take 1 capsule (100 mg total) by mouth 3 (three) times daily. 90 capsule 3  ? vitamin C (ASCORBIC ACID) 500 MG tablet Take 500 mg by mouth daily.    ? zolpidem (AMBIEN) 10 MG tablet Take 10 mg by mouth at bedtime.    ? ?No current facility-administered medications for this encounter.  ? ? ?Allergies  ?Allergen Reactions  ? Fenofibrate Other (See Comments)  ?  myalgias  ? ? ?Social History  ? ?Socioeconomic History  ? Marital status: Married  ?  Spouse name: Not on file  ? Number of children: Not on file  ? Years of education: Not on file  ? Highest education level: Not on file  ?Occupational History  ? Not on file  ?Tobacco Use  ? Smoking status: Never  ? Smokeless tobacco: Never  ?Vaping Use  ? Vaping Use: Never used  ?Substance and Sexual Activity  ? Alcohol use: No  ? Drug use: No  ? Sexual activity: Not on file  ?Other Topics Concern  ? Not on file  ?Social History Narrative  ? "Evan Robinson"  ? Lives with wife  ? Occupation: retired, was Engineer, maintenance (IT)  ? Edu:BS  ? Activity: walks daily, some gym  ? Diet:   ? ?Social Determinants of Health  ? ?Financial Resource Strain: Not on file  ?Food Insecurity: Not on file  ?Transportation Needs: Not on file  ?Physical Activity: Not on file  ?Stress: Not on file  ?Social Connections: Not on file  ?Intimate Partner Violence: Not on file  ? ? ?Family History  ?Problem Relation Age of Onset  ? Alcohol abuse Father   ? Cancer Father   ?     prostate  ? CAD Father   ?     possible MI  ?  Alcohol abuse Paternal Grandfather   ? Stroke Mother   ? Diabetes Neg Hx   ? ? ?ROS- All systems are reviewed and negative except as per the HPI above ? ?Physical Exam: ?Vitals:  ? 08/31/21 1131  ?BP: 138/72  ?Pulse: (!) 57  ?Weight: 90.7 kg  ?Height: 6' (1.829 m)  ? ?Wt Readings from Last 3 Encounters:  ?08/31/21 90.7 kg  ?08/26/21 89.8 kg  ?07/30/21 86.2 kg  ? ? ?Labs: ?  Lab Results  ?Component Value Date  ? NA 132 (L) 07/09/2021  ? K 3.7 07/09/2021  ? CL 93 (L) 07/09/2021  ? CO2 28 07/09/2021  ? GLUCOSE 93 07/09/2021  ? BUN 13 07/09/2021  ? CREATININE 0.88 07/09/2021  ? CALCIUM 9.7 07/09/2021  ? MG 1.7 03/21/2021  ? ?No results found for: INR ?Lab Results  ?Component Value Date  ? CHOL 202 (A) 07/02/2012  ? HDL 37 07/02/2012  ? TRIG 321 07/02/2012  ? ? ? ?GEN- The patient is well appearing, alert and oriented x 3 today.   ?Head- normocephalic, atraumatic ?Eyes-  Sclera clear, conjunctiva pink ?Ears- hearing intact ?Oropharynx- clear ?Neck- supple, no JVP ?Lymph- no cervical lymphadenopathy ?Lungs- Clear to ausculation bilaterally, normal work of breathing ?Heart- Regular rate and rhythm, no murmurs, rubs or gallops, PMI not laterally displaced ?GI- soft, NT, ND, + BS ?Extremities- no clubbing, cyanosis, or edema ?MS- no significant deformity or atrophy ?Skin- no rash or lesion ?Psych- euthymic mood, full affect ?Neuro- strength and sensation are intact ? ?EKG-sinus brady at 57 bpm, pr int 198 ms, qrs int 124 ms, qtc 436 ms  ? ? ? ?Assessment and Plan:  ?1. Paroxysmal afib ?Successful ablation 3/31 ?Staying in SR  ?He will continue flecainide 50 mg bid and Cardizem 120 mg daily ?He has  RX 30 mg Cardizem to use as needed if breakthrough afib  ? ?2. CHA2DS2VASc score of 3 ?Continue eliquis 5 mg bid  ? ?3. HTN ?Stable  ? ?F/u with Dr. Quentin Ore 7/5 ? ?Geroge Baseman Kayleen Memos, ANP-C ?Afib Clinic ?[facility] ?[address] ?[place], [place] [address] ?[phone]   ? ?

## 2021-10-04 ENCOUNTER — Emergency Department (HOSPITAL_COMMUNITY): Payer: Medicare PPO

## 2021-10-04 ENCOUNTER — Emergency Department (HOSPITAL_BASED_OUTPATIENT_CLINIC_OR_DEPARTMENT_OTHER): Payer: Medicare PPO

## 2021-10-04 ENCOUNTER — Other Ambulatory Visit: Payer: Self-pay

## 2021-10-04 ENCOUNTER — Encounter (HOSPITAL_BASED_OUTPATIENT_CLINIC_OR_DEPARTMENT_OTHER): Payer: Self-pay

## 2021-10-04 ENCOUNTER — Emergency Department (HOSPITAL_BASED_OUTPATIENT_CLINIC_OR_DEPARTMENT_OTHER)
Admission: EM | Admit: 2021-10-04 | Discharge: 2021-10-04 | Disposition: A | Discharge status: Home / Self Care | Payer: Medicare PPO | Attending: Emergency Medicine | Admitting: Emergency Medicine

## 2021-10-04 DIAGNOSIS — R42 Dizziness and giddiness: Secondary | ICD-10-CM | POA: Insufficient documentation

## 2021-10-04 DIAGNOSIS — R519 Headache, unspecified: Secondary | ICD-10-CM | POA: Diagnosis not present

## 2021-10-04 DIAGNOSIS — Z79899 Other long term (current) drug therapy: Secondary | ICD-10-CM | POA: Diagnosis not present

## 2021-10-04 DIAGNOSIS — I10 Essential (primary) hypertension: Secondary | ICD-10-CM | POA: Insufficient documentation

## 2021-10-04 DIAGNOSIS — Z7901 Long term (current) use of anticoagulants: Secondary | ICD-10-CM | POA: Insufficient documentation

## 2021-10-04 HISTORY — DX: Dizziness and giddiness: R42

## 2021-10-04 LAB — COMPREHENSIVE METABOLIC PANEL
ALT: 10 U/L (ref 0–44)
AST: 17 U/L (ref 15–41)
Albumin: 4.4 g/dL (ref 3.5–5.0)
Alkaline Phosphatase: 55 U/L (ref 38–126)
Anion gap: 12 (ref 5–15)
BUN: 12 mg/dL (ref 8–23)
CO2: 26 mmol/L (ref 22–32)
Calcium: 10.2 mg/dL (ref 8.9–10.3)
Chloride: 96 mmol/L — ABNORMAL LOW (ref 98–111)
Creatinine, Ser: 1 mg/dL (ref 0.61–1.24)
GFR, Estimated: >60 mL/min (ref >60)
Glucose, Bld: 130 mg/dL — ABNORMAL HIGH (ref 70–99)
Potassium: 3.7 mmol/L (ref 3.5–5.1)
Sodium: 134 mmol/L — ABNORMAL LOW (ref 135–145)
Total Bilirubin: 0.7 mg/dL (ref 0.3–1.2)
Total Protein: 7.3 g/dL (ref 6.5–8.1)

## 2021-10-04 LAB — DIFFERENTIAL
Abs Immature Granulocytes: 0.03 10*3/uL (ref 0.00–0.07)
Basophils Absolute: 0 10*3/uL (ref 0.0–0.1)
Basophils Relative: 0 %
Eosinophils Absolute: 0.1 10*3/uL (ref 0.0–0.5)
Eosinophils Relative: 2 %
Immature Granulocytes: 1 %
Lymphocytes Relative: 20 %
Lymphs Abs: 1 10*3/uL (ref 0.7–4.0)
Monocytes Absolute: 0.3 10*3/uL (ref 0.1–1.0)
Monocytes Relative: 7 %
Neutro Abs: 3.2 10*3/uL (ref 1.7–7.7)
Neutrophils Relative %: 70 %

## 2021-10-04 LAB — CBC
HCT: 43.7 % (ref 39.0–52.0)
Hemoglobin: 15.3 g/dL (ref 13.0–17.0)
MCH: 31.1 pg (ref 26.0–34.0)
MCHC: 35 g/dL (ref 30.0–36.0)
MCV: 88.8 fL (ref 80.0–100.0)
Platelets: 180 10*3/uL (ref 150–400)
RBC: 4.92 MIL/uL (ref 4.22–5.81)
RDW: 13.1 % (ref 11.5–15.5)
WBC: 4.7 10*3/uL (ref 4.0–10.5)
nRBC: 0 % (ref 0.0–0.2)

## 2021-10-04 LAB — PROTIME-INR
INR: 1.4 — ABNORMAL HIGH (ref 0.8–1.2)
Prothrombin Time: 16.7 seconds — ABNORMAL HIGH (ref 11.4–15.2)

## 2021-10-04 LAB — APTT: aPTT: 42 seconds — ABNORMAL HIGH (ref 24–36)

## 2021-10-04 MED ORDER — KETOROLAC TROMETHAMINE 15 MG/ML IJ SOLN
INTRAMUSCULAR | Status: AC
  Filled 2021-10-04: qty 1

## 2021-10-04 MED ORDER — LORAZEPAM 2 MG/ML IJ SOLN
0.5000 mg | Freq: Once | INTRAMUSCULAR | Status: AC
  Administered 2021-10-04: 0.5 mg via INTRAVENOUS

## 2021-10-04 MED ORDER — MECLIZINE HCL 25 MG PO TABS
50.0000 mg | ORAL_TABLET | Freq: Once | ORAL | Status: AC
  Administered 2021-10-04: 50 mg via ORAL
  Filled 2021-10-04: qty 2

## 2021-10-04 MED ORDER — DEXAMETHASONE SODIUM PHOSPHATE 10 MG/ML IJ SOLN
10.0000 mg | Freq: Once | INTRAMUSCULAR | Status: AC
  Administered 2021-10-04: 10 mg via INTRAVENOUS
  Filled 2021-10-04: qty 1

## 2021-10-04 MED ORDER — MECLIZINE HCL 25 MG PO TABS: 25.0000 mg | ORAL_TABLET | Freq: Three times a day (TID) | ORAL | 0 refills | Status: DC | PRN

## 2021-10-04 MED ORDER — MECLIZINE HCL 25 MG PO TABS
50.0000 mg | ORAL_TABLET | Freq: Once | ORAL | Status: AC
Start: 2021-10-04 — End: 2021-10-04
  Administered 2021-10-04: 50 mg via ORAL

## 2021-10-04 MED ORDER — METOCLOPRAMIDE HCL 5 MG/ML IJ SOLN
10.0000 mg | Freq: Once | INTRAMUSCULAR | Status: AC
  Administered 2021-10-04: 10 mg via INTRAVENOUS
  Filled 2021-10-04: qty 2

## 2021-10-04 MED ORDER — LORAZEPAM 2 MG/ML IJ SOLN
INTRAMUSCULAR | Status: AC
  Filled 2021-10-04: qty 1

## 2021-10-04 MED ORDER — SODIUM CHLORIDE 0.9 % IV BOLUS
1000.0000 mL | Freq: Once | INTRAVENOUS | Status: AC
  Administered 2021-10-04: 1000 mL via INTRAVENOUS

## 2021-10-04 MED ORDER — SODIUM CHLORIDE 0.9 % IV BOLUS
500.0000 mL | Freq: Once | INTRAVENOUS | Status: AC
Start: 2021-10-04 — End: 2021-10-04
  Administered 2021-10-04: 500 mL via INTRAVENOUS

## 2021-10-04 MED ORDER — KETOROLAC TROMETHAMINE 30 MG/ML IJ SOLN
15.0000 mg | Freq: Once | INTRAMUSCULAR | Status: AC
  Administered 2021-10-04: 15 mg via INTRAVENOUS

## 2021-10-04 MED ORDER — MECLIZINE HCL 25 MG PO TABS
ORAL_TABLET | ORAL | Status: AC
  Administered 2021-10-04: 50 mg via ORAL
  Filled 2021-10-04: qty 2

## 2021-10-04 NOTE — ED Provider Notes (Signed)
[facility] EMERGENCY DEPT Provider Note   CSN: [id number] Arrival date & time: 10/04/21  0830     History  Chief Complaint  Patient presents with   Dizziness   Headache    Evan Robinson is a 76 y.o. male.  HPI  76 year old male with past medical history of HTN, HLD, migraines, vertigo presents to the emergency department with concern for frontal headache and dizziness.  Patient states about 4 to 5 days ago he developed a frontal headache, this was associated with vertigo and room spinning sensation.  He also felt very "out of it", confused, having difficulty walking.  He is used home medications without significant improvement.  This all started after he took a nap, he woke up feeling unwell.  He states that his balance is slightly improved this morning but overall is not getting better so came in for evaluation.  No fever, neck pain/stiffness, nausea/vomiting/diarrhea.  He at times has transient blurred vision bilaterally but denies any vision loss, facial droop, speech change, unilateral weakness/numbness.  Home Medications Prior to Admission medications   Medication Sig Start Date End Date Taking? Authorizing Provider  ALPRAZolam Prudy Feeler) 0.5 MG tablet Take 0.5 mg by mouth 2 (two) times daily as needed for anxiety. 09/22/20   [provider]  b complex vitamins capsule Take 1 capsule by mouth daily.    [provider]  Carboxymethylcellul-Glycerin (CLEAR EYES FOR DRY EYES OP) Place 1 drop into both eyes daily as needed (Dry eyes).    [provider]  Cholecalciferol (VITAMIN D) 50 MCG (2000 UT) CAPS Take 2,000 Units by mouth daily.    [provider]  diltiazem (CARDIZEM CD) 120 MG 24 hr capsule Take 120 mg by mouth daily. 08/13/19   [provider]  diltiazem (CARDIZEM) 30 MG tablet Take 1 Tablet Every 4 Hours As Needed For HR >100 and Top BP>100 03/24/21   Newman Nip, NP  ELIQUIS 5 MG TABS tablet Take 5 mg by mouth 2  (two) times daily. 11/29/19   [provider]  flecainide (TAMBOCOR) 50 MG tablet Take 50 mg by mouth 2 (two) times daily. 12/05/19   [provider]  gemfibrozil (LOPID) 600 MG tablet Take 600 mg by mouth daily before breakfast.    [provider]  hydrochlorothiazide (HYDRODIURIL) 25 MG tablet Take 25 mg by mouth daily. 11/11/19   [provider]  HYDROcodone-acetaminophen (NORCO/VICODIN) 5-325 MG per tablet Take 1 tablet by mouth every 6 (six) hours as needed (headache).    [provider]  losartan (COZAAR) 25 MG tablet Take 25 mg by mouth daily.    [provider]  pantoprazole (PROTONIX) 40 MG tablet Take 1 tablet (40 mg total) by mouth daily. 07/30/21 09/13/21  Graciella Freer, PA-C  pregabalin (LYRICA) 100 MG capsule Take 1 capsule (100 mg total) by mouth 3 (three) times daily. 08/26/21   Raulkar, Drema Pry, MD  vitamin C (ASCORBIC ACID) 500 MG tablet Take 500 mg by mouth daily.    [provider]  zolpidem (AMBIEN) 10 MG tablet Take 10 mg by mouth at bedtime. 07/06/21   [provider]      Allergies    Fenofibrate    Review of Systems   Review of Systems  Constitutional:  Negative for fever.  Eyes:  Positive for visual disturbance.  Respiratory:  Negative for shortness of breath.   Cardiovascular:  Negative for chest pain.  Gastrointestinal:  Negative for abdominal pain, diarrhea and  vomiting.  Musculoskeletal:  Negative for neck pain.  Skin:  Negative for rash.  Neurological:  Positive for dizziness, light-headedness and headaches. Negative for tremors, syncope, facial asymmetry, speech difficulty, weakness and numbness.   Physical Exam Updated Vital Signs BP 130/79   Pulse (!) 58   Resp 12   Ht 6' (1.829 m)   Wt 87.1 kg   SpO2 97%   BMI 26.04 kg/m  Physical Exam Vitals and nursing note reviewed.  Constitutional:      General: He is not in acute distress.    Appearance: Normal appearance.  HENT:      Head: Normocephalic.     Mouth/Throat:     Mouth: Mucous membranes are moist.  Eyes:     General: No visual field deficit.    Extraocular Movements: Extraocular movements intact.     Pupils: Pupils are equal, round, and reactive to light.  Cardiovascular:     Rate and Rhythm: Normal rate.  Pulmonary:     Effort: Pulmonary effort is normal. No respiratory distress.  Abdominal:     Palpations: Abdomen is soft.     Tenderness: There is no abdominal tenderness.  Musculoskeletal:     Cervical back: No rigidity.  Skin:    General: Skin is warm.  Neurological:     Mental Status: He is alert and oriented to person, place, and time. Mental status is at baseline.     Cranial Nerves: No cranial nerve deficit, dysarthria or facial asymmetry.     Sensory: No sensory deficit.     Motor: No weakness.  Psychiatric:        Mood and Affect: Mood is anxious.    ED Results / Procedures / Treatments   Labs (all labs ordered are listed, but only abnormal results are displayed) Labs Reviewed  PROTIME-INR - Abnormal; Notable for the following components:      Result Value   Prothrombin Time 16.7 (*)    INR 1.4 (*)    All other components within normal limits  APTT - Abnormal; Notable for the following components:   aPTT 42 (*)    All other components within normal limits  COMPREHENSIVE METABOLIC PANEL - Abnormal; Notable for the following components:   Sodium 134 (*)    Chloride 96 (*)    Glucose, Bld 130 (*)    All other components within normal limits  CBC  DIFFERENTIAL  CBG MONITORING, ED    EKG EKG Interpretation  Date/Time:  Monday October 04 2021 08:40:12 EDT Ventricular Rate:  72 PR Interval:  199 QRS Duration: 134 QT Interval:  422 QTC Calculation: 462 R Axis:   -80 Text Interpretation: Sinus rhythm Nonspecific IVCD with LAD Confirmed by Coralee PesaHorton, Mariyah Upshaw [phone]) on 10/04/2021 8:51:28 AM  Radiology CT HEAD WO CONTRAST  Result Date: 10/04/2021 CLINICAL DATA:  Headache, new  or worsening (Age >= 50y) EXAM: CT HEAD WITHOUT CONTRAST TECHNIQUE: Contiguous axial images were obtained from the base of the skull through the vertex without intravenous contrast. RADIATION DOSE REDUCTION: This exam was performed according to the departmental dose-optimization program which includes automated exposure control, adjustment of the mA and/or kV according to patient size and/or use of iterative reconstruction technique. COMPARISON:  MRI head 11/10/2017. FINDINGS: Brain: No evidence of acute infarction, hemorrhage, hydrocephalus, extra-axial collection or mass lesion/mass effect. Vascular: No hyperdense vessel identified. Skull: No acute fracture. Sinuses/Orbits: Mild paranasal sinus mucosal thickening. No acute orbital findings. Other: No mastoid effusions. IMPRESSION: No evidence of acute intracranial abnormality.  Electronically Signed   By: Feliberto Harts M.D.   On: 10/04/2021 09:17    Procedures Procedures    Medications Ordered in ED Medications  meclizine (ANTIVERT) tablet 50 mg (50 mg Oral Given 10/04/21 1021)  ketorolac (TORADOL) 30 MG/ML injection 15 mg (15 mg Intravenous Given 10/04/21 1020)  sodium chloride 0.9 % bolus 500 mL (0 mLs Intravenous Stopped 10/04/21 1123)  LORazepam (ATIVAN) injection 0.5 mg (0.5 mg Intravenous Given 10/04/21 1019)    ED Course/ Medical Decision Making/ A&P                           Medical Decision Making Amount and/or Complexity of Data Reviewed Labs: ordered. Radiology: ordered.  Risk Prescription drug management.   76 year old male presents emergency department with headache and dizziness.  States that this feels different from his vertigo.  Tried home therapy without improvement.  Still complaining of a frontal headache, no neck pain.  Feels improved however still lightheaded, sluggish and confused.  Symptoms persistent for the last 5 days.  Vital stable on arrival.  He is overall fatigued but otherwise neurologically intact, does seem  unsteady in regards to gait but not falling to one side specifically.  Face is symmetric, vision intact, no nystagmus.  No focal neurodeficit.  Blood work is reassuring and baseline.  CT of the head shows no acute findings.  Multiple different therapeutic measures taken here without significant improvement.  Plan for transfer to ER at [facility] for MRI for further neurology evaluation/CVA rule out.  Dispo will be pending this imaging results/symptom control.  Dr. Gwenette Greet is excepting.  Patient stable at time of transfer.        Final Clinical Impression(s) / ED Diagnoses Final diagnoses:  None    Rx / DC Orders ED Discharge Orders     None         Rozelle Logan, DO 10/04/21 1359

## 2021-10-04 NOTE — ED Provider Notes (Signed)
4:46 PM Care assumed from Dr. Vanita Panda.  At time of transfer care, patient is waiting for MRI to rule out acute stroke as the cause of the dizziness and headache.  Plan of care per previous team is to discharge home if MRI is reassuring.  6:14 PM MRI returned without evidence of stroke.  Per previous plan, will discharge home.  Patient requested medicine for dizziness and will give prescription for meclizine as well.  Patient will follow-up with PCP and understood return precautions.  Patient discharged in good condition.  Clinical Impression: 1. Dizziness     Disposition: Discharge  Condition: Good  I have discussed the results, Dx and Tx plan with the pt(& family if present). He/she/they expressed understanding and agree(s) with the plan. Discharge instructions discussed at great length. Strict return precautions discussed and pt &/or family have verbalized understanding of the instructions. No further questions at time of discharge.    New Prescriptions   MECLIZINE (ANTIVERT) 25 MG TABLET    Take 1 tablet (25 mg total) by mouth 3 (three) times daily as needed for dizziness.    Follow Up: Henrietta Dine, MD [place] [id number] [phone]     [place] [address] [id number] mc [place]         Deyna Carbon, Gwenyth Allegra, MD 10/04/21 1816

## 2021-10-04 NOTE — ED Provider Notes (Signed)
3:36 PM Patient seen on arrival and again now after his wife is joining him.  Patient transferred from our affiliated facility due to concern for headache, dizziness, atypical.  He is awake and alert, speech is clear face is symmetric he has no extremity weakness.  MRI ordered, meds ordered, disposition pending MRI, repeat evaluation which will likely be conducted by oncoming care team.   Gerhard Munch, MD 10/04/21 1536

## 2021-10-04 NOTE — ED Notes (Signed)
Pt arrives from [facility] for MRI for a headache since Wednesday.

## 2021-10-04 NOTE — ED Triage Notes (Signed)
"  5 days ago, you fell asleep while reading, woke up dizzy and confused for the rest of the day, I laid down and felt like I was hallucinating, since then I have had a constant headache, balance is a little bit better, came in this morning because it does not seem to be getting better" per pt  Denies chills, fever, n/v/d.

## 2021-10-04 NOTE — ED Notes (Signed)
Handoff report given to [facility].  Attempt to call [place] charge Unavailable.  Will call back again

## 2021-10-04 NOTE — ED Triage Notes (Signed)
"  He has a history of vertigo so I thought it may be a flare up of that, tried benadryl and dramamine, but that didn't help at all" per spouse

## 2021-10-04 NOTE — Discharge Instructions (Signed)
Your history, exam, work-up today did not show evidence of acute stroke.  Please use the meclizine up with dizziness as reported helped you earlier today.  Please rest and stay hydrated.  Please be careful not to fall.  If any symptoms change or worsen acutely, please return to emergency department.  Please follow-up with your primary doctor.

## 2021-10-12 ENCOUNTER — Encounter: Payer: Self-pay | Admitting: Physical Medicine and Rehabilitation

## 2021-10-13 ENCOUNTER — Other Ambulatory Visit: Payer: Self-pay | Admitting: Physical Medicine and Rehabilitation

## 2021-10-13 MED ORDER — NURTEC 75 MG PO TBDP: 1.0000 | ORAL_TABLET | ORAL | 3 refills | Status: DC

## 2021-10-14 ENCOUNTER — Telehealth: Payer: Self-pay

## 2021-10-14 NOTE — Telephone Encounter (Signed)
PA submitted for Nurtec

## 2021-10-15 ENCOUNTER — Other Ambulatory Visit: Payer: Self-pay | Admitting: Physical Medicine and Rehabilitation

## 2021-10-15 MED ORDER — SUMATRIPTAN SUCCINATE 50 MG PO TABS: 50.0000 mg | ORAL_TABLET | Freq: Once | ORAL | 2 refills | Status: DC | PRN

## 2021-10-15 NOTE — Telephone Encounter (Signed)
Nurtec denied. Criteria tried or cannot use two of the following: naratriptan, rizatriptan, sumatriptan. Patient states insurance said they will pay for Imitrex.  He is willing to try that.

## 2021-10-18 NOTE — Telephone Encounter (Signed)
Appealed, redetermination approved 1/123-05/01/22

## 2021-11-02 NOTE — Progress Notes (Unsigned)
Electrophysiology Office Follow up Visit Note:    Date:  11/03/2021   ID:  Evan Robinson, DOB 09-Aug-1945, MRN [id number]  PCP:  Henrietta Dine, MD  [facility] Cardiologist:  None  [facility] Electrophysiologist:  Vickie Epley, MD    Interval History:    Evan Robinson is a 76 y.o. male who presents for a follow up visit after his 07/30/2021 AF ablation. During the procedure the veins were isolated. He saw Butch Penny in the AF clinic 08/31/2021 for follow up. At that appointment he was maintaining normal rhythm on flecainide $RemoveBefor'50mg'[id number]$  PO BID.   He has done well after the ablation without any sustained episodes of arrhythmia.  He did have an episode about 6 weeks ago where he felt an elevated resting heart rate that was not the same as his prior A-fib episodes.     Past Medical History:  Diagnosis Date   Dyslipidemia    H/O prostate cancer 05/02/2001   s/p radical prostatectomy (uro Dr. Rush Farmer in [place] yearly)   Headache 05/02/2010   thorough w/u - unrevealing.  has seen 3 neurologists, MRI, LP nonacute.  ESR = 3   HTN (hypertension)    Hypogonadism male    Low testosterone    Migraines    OSA on CPAP    Renal cyst 12/01/2010   complex cyst upper pole L kidney with no malignancy by MRI   Vertigo    Vitamin D deficiency     Past Surgical History:  Procedure Laterality Date   ATRIAL FIBRILLATION ABLATION N/A 07/30/2021   Procedure: ATRIAL FIBRILLATION ABLATION;  Surgeon: Vickie Epley, MD;  Location: [facility];  Service: Cardiovascular;  Laterality: N/A;   CHOLECYSTECTOMY  2006   COLONOSCOPY WITH PROPOFOL N/A 02/01/2017   Procedure: COLONOSCOPY WITH PROPOFOL;  Surgeon: Toledo, Benay Pike, MD;  Location: [facility];  Service: Gastroenterology;  Laterality: N/A;   ESOPHAGOGASTRODUODENOSCOPY (EGD) WITH PROPOFOL N/A 02/01/2017   Procedure: ESOPHAGOGASTRODUODENOSCOPY (EGD) WITH PROPOFOL;  Surgeon: Toledo, Benay Pike, MD;  Location: [facility];  Service:  Gastroenterology;  Laterality: N/A;   HERNIA REPAIR  08/2010   PROSTATECTOMY  2003    Current Medications: Current Meds  Medication Sig   ALPRAZolam (XANAX) 0.5 MG tablet Take 0.5 mg by mouth 2 (two) times daily as needed for anxiety.   b complex vitamins capsule Take 1 capsule by mouth daily.   Carboxymethylcellul-Glycerin (CLEAR EYES FOR DRY EYES OP) Place 1 drop into both eyes daily as needed (Dry eyes).   Cholecalciferol (VITAMIN D) 50 MCG (2000 UT) CAPS Take 2,000 Units by mouth daily.   diltiazem (CARDIZEM CD) 120 MG 24 hr capsule Take 120 mg by mouth daily.   diltiazem (CARDIZEM) 30 MG tablet Take 1 Tablet Every 4 Hours As Needed For HR >100 and Top BP>100   ELIQUIS 5 MG TABS tablet Take 5 mg by mouth 2 (two) times daily.   flecainide (TAMBOCOR) 50 MG tablet Take 50 mg by mouth 2 (two) times daily.   gemfibrozil (LOPID) 600 MG tablet Take 600 mg by mouth daily before breakfast.   hydrochlorothiazide (HYDRODIURIL) 25 MG tablet Take 25 mg by mouth daily.   HYDROcodone-acetaminophen (NORCO/VICODIN) 5-325 MG per tablet Take 1 tablet by mouth every 6 (six) hours as needed (headache).   losartan (COZAAR) 25 MG tablet Take 25 mg by mouth daily.   meclizine (ANTIVERT) 25 MG tablet Take 1 tablet (25 mg total) by mouth 3 (three) times daily as needed for dizziness.  pregabalin (LYRICA) 100 MG capsule Take 1 capsule (100 mg total) by mouth 3 (three) times daily.   Rimegepant Sulfate (NURTEC) 75 MG TBDP Take 1 tablet by mouth every other day.   SUMAtriptan (IMITREX) 50 MG tablet Take 1 tablet (50 mg total) by mouth once as needed for migraine. May repeat in 2 hours if headache persists or recurs.   vitamin C (ASCORBIC ACID) 500 MG tablet Take 500 mg by mouth daily.   zolpidem (AMBIEN) 10 MG tablet Take 10 mg by mouth at bedtime.     Allergies:   Fenofibrate   Social History   Socioeconomic History   Marital status: Married    Spouse name: Not on file   Number of children: Not on file    Years of education: Not on file   Highest education level: Not on file  Occupational History   Not on file  Tobacco Use   Smoking status: Never   Smokeless tobacco: Never  Vaping Use   Vaping Use: Never used  Substance and Sexual Activity   Alcohol use: No   Drug use: No   Sexual activity: Not on file  Other Topics Concern   Not on file  Social History Narrative   "Gene"   Lives with wife   Occupation: retired, was Pharmacist, hospital   Activity: walks daily, some gym   Diet:    Social Determinants of Radio broadcast assistant Strain: Not on file  Food Insecurity: Not on file  Transportation Needs: Not on file  Physical Activity: Not on file  Stress: Not on file  Social Connections: Not on file     Family History: The patient's family history includes Alcohol abuse in his father and paternal grandfather; CAD in his father; Cancer in his father; Stroke in his mother. There is no history of Diabetes.  ROS:   Please see the history of present illness.    All other systems reviewed and are negative.  EKGs/Labs/Other Studies Reviewed:    The following studies were reviewed today:   EKG:  The ekg ordered today demonstrates sinus rhythm with a ventricular rate of 58 bpm.  Recent Labs: 03/21/2021: B Natriuretic Peptide 102.8; Magnesium 1.7 10/04/2021: ALT 10; BUN 12; Creatinine, Ser 1.00; Hemoglobin 15.3; Platelets 180; Potassium 3.7; Sodium 134  Recent Lipid Panel    Component Value Date/Time   CHOL 202 (A) 07/02/2012 0000   TRIG 321 07/02/2012 0000   HDL 37 07/02/2012 0000   LDLDIRECT 101 07/02/2012 0000    Physical Exam:    VS:  BP 126/86   Pulse (!) 58   Wt 148 lb (67.1 kg)   SpO2 98%   BMI 20.07 kg/m     Wt Readings from Last 3 Encounters:  11/03/21 148 lb (67.1 kg)  10/04/21 192 lb (87.1 kg)  08/31/21 200 lb (90.7 kg)     GEN:  Well nourished, well developed in no acute distress HEENT: Normal NECK: No JVD; No carotid bruits LYMPHATICS: No  lymphadenopathy CARDIAC: RRR, no murmurs, rubs, gallops RESPIRATORY:  Clear to auscultation without rales, wheezing or rhonchi  ABDOMEN: Soft, non-tender, non-distended MUSCULOSKELETAL:  No edema; No deformity  SKIN: Warm and dry NEUROLOGIC:  Alert and oriented x 3 PSYCHIATRIC:  Normal affect        ASSESSMENT:    1. Paroxysmal atrial fibrillation (HCC)   2. OSA on CPAP   3. Primary hypertension   4. Encounter for long-term (current) use of high-risk medication  PLAN:    In order of problems listed above:   #Paroxysmal atrial fibrillation Maintaining normal rhythm after his ablation.  Continue Eliquis for stroke prophylaxis in the short-term.  Okay to stop flecainide given the good results after his ablation.  If he maintains rhythm for another 6 months, I think it is reasonable for him to stop the Eliquis as long as there is a atrial fibrillation surveillance strategy in place.  For now he is using an Apple Watch which I think would be a reasonable strategy.  We discussed how there is no clear guidance for this after an ablation.  His CHA2DS2-VASc is 2 for age and hypertension.  If he does decide to stop his blood thinner in 6 months, would favor him starting a baby aspirin daily.  #Obstructive sleep apnea Continue CPAP.  #Hypertension Controlled Continue current medication regimen  Follow-up 6 months with an APP      Medication Adjustments/Labs and Tests Ordered: Current medicines are reviewed at length with the patient today.  Concerns regarding medicines are outlined above.  No orders of the defined types were placed in this encounter.  No orders of the defined types were placed in this encounter.    Signed, Lars Mage, MD, [facility], [facility] 11/03/2021 1:48 PM    Electrophysiology [facility]

## 2021-11-03 ENCOUNTER — Encounter: Payer: Self-pay | Admitting: Cardiology

## 2021-11-03 ENCOUNTER — Ambulatory Visit: Payer: Medicare PPO | Admitting: Cardiology

## 2021-11-03 VITALS — BP 126/86 | HR 58 | Wt 148.0 lb

## 2021-11-03 DIAGNOSIS — I480 Paroxysmal atrial fibrillation: Secondary | ICD-10-CM | POA: Diagnosis not present

## 2021-11-03 DIAGNOSIS — I10 Essential (primary) hypertension: Secondary | ICD-10-CM | POA: Diagnosis not present

## 2021-11-03 DIAGNOSIS — Z79899 Other long term (current) drug therapy: Secondary | ICD-10-CM | POA: Diagnosis not present

## 2021-11-03 DIAGNOSIS — G4733 Obstructive sleep apnea (adult) (pediatric): Secondary | ICD-10-CM | POA: Diagnosis not present

## 2021-11-03 DIAGNOSIS — Z9989 Dependence on other enabling machines and devices: Secondary | ICD-10-CM

## 2021-11-03 NOTE — Patient Instructions (Addendum)
Medication Instructions:  Stop Flecainide  Your physician recommends that you continue on your current medications as directed. Please refer to the Current Medication list given to you today. *If you need a refill on your cardiac medications before your next appointment, please call your pharmacy*  Lab Work: None. If you have labs (blood work) drawn today and your tests are completely normal, you will receive your results only by: MyChart Message (if you have MyChart) OR A paper copy in the mail If you have any lab test that is abnormal or we need to change your treatment, we will call you to review the results.  Testing/Procedures: None.  Follow-Up: At [facility], you and your health needs are our priority.  As part of our continuing mission to provide you with exceptional heart care, we have created designated Provider Care Teams.  These Care Teams include your primary Cardiologist (physician) and Advanced Practice Providers (APPs -  Physician Assistants and Nurse Practitioners) who all work together to provide you with the care you need, when you need it.  Your physician wants you to follow-up in: 6 months with one of the following Advanced Practice Providers on your designated Care Team:    Francis Dowse, [place] Casimiro Needle "Mardelle Matte" [place], [place]   We recommend signing up for the patient portal called "MyChart".  Sign up information is provided on this After Visit Summary.  MyChart is used to connect with patients for Virtual Visits (Telemedicine).  Patients are able to view lab/test results, encounter notes, upcoming appointments, etc.  Non-urgent messages can be sent to your provider as well.   To learn more about what you can do with MyChart, go to [email or web address].    Any Other Special Instructions Will Be Listed Below (If Applicable).

## 2021-11-15 ENCOUNTER — Encounter: Payer: Medicare PPO | Attending: Physical Medicine and Rehabilitation | Admitting: Physical Medicine and Rehabilitation

## 2021-11-15 ENCOUNTER — Encounter: Payer: Self-pay | Admitting: Physical Medicine and Rehabilitation

## 2021-11-15 VITALS — BP 146/77 | HR 63 | Temp 97.9°F | Ht 72.0 in | Wt 196.0 lb

## 2021-11-15 DIAGNOSIS — M792 Neuralgia and neuritis, unspecified: Secondary | ICD-10-CM | POA: Insufficient documentation

## 2021-11-15 DIAGNOSIS — G479 Sleep disorder, unspecified: Secondary | ICD-10-CM | POA: Diagnosis not present

## 2021-11-15 NOTE — Progress Notes (Deleted)

## 2021-11-16 NOTE — Progress Notes (Signed)
Subjective:    Patient ID: Evan Robinson, male    DOB: 1945-08-01, 76 y.o.   MRN: [id number]  HPI: Evan Robinson is a 76 y.o. male who presents for follow-up of facial pain, shoulder pain, and capelike distribution of upper back and neck pain.   He returns for follow-up appointment for chronic pain and medication refill. He reports he has facial pain and neck pain radiating into his bilateral shoulders He rates his pain 7.His current exercise regime is walking and attending physical therapy two days a week.   His pain has improved since last visit- he has noted improvements with Qutenza -he does feel burning and tingling during the first couple of days after application but this dissapates shortly afterward He current has no sunburns -he has plans to travel this summer with his wife -he maintains on his prior medications and does not need any further refills today He has burning in left sided facial pain and shoulder It is worst with friction and exacerbated with lying down.  He has been on the Lyrica for years. He is currently 185m BID and he is able to tolerate this dose well. He had dizziness with higher doses He has not had cognitive side effects He did well with the memory recall.  He has never tried CBD oil.  Pain worsens with shaving He tried aloe vera without great benefit He is excited to try Qutenza today- it gave him about 1 month of relief last visit.  He is not sure if he has tried carbamazepine Capsaicin made his skin burn and raw in the past.  -He is interested in trying medical marijuana if it gets approved.     Pain Inventory Average Pain 8 Pain Right Now 7 My pain is burning  In the last 24 hours, has pain interfered with the following? General activity 7 Relation with others 8 Enjoyment of life 7 What TIME of day is your pain at its worst? evening and night Sleep (in general) Fair  Pain is worse with: sitting Pain improves with: medication Relief from  Meds: 4  Family History  Problem Relation Age of Onset   Alcohol abuse Father    Cancer Father        prostate   CAD Father        possible MI   Alcohol abuse Paternal Grandfather    Stroke Mother    Diabetes Neg Hx    Social History   Socioeconomic History   Marital status: Married    Spouse name: Not on file   Number of children: Not on file   Years of education: Not on file   Highest education level: Not on file  Occupational History   Not on file  Tobacco Use   Smoking status: Never   Smokeless tobacco: Never  Vaping Use   Vaping Use: Never used  Substance and Sexual Activity   Alcohol use: No   Drug use: No   Sexual activity: Not on file  Other Topics Concern   Not on file  Social History Narrative   "Gene"   Lives with wife   Occupation: retired, was CPharmacist, hospital  Activity: walks daily, some gym   Diet:    Social Determinants of HRadio broadcast assistantStrain: Not on file  Food Insecurity: Not on file  Transportation Needs: Not on file  Physical Activity: Not on file  Stress: Not on file  Social Connections: Not on file  Past Surgical History:  Procedure Laterality Date   ATRIAL FIBRILLATION ABLATION N/A 07/30/2021   Procedure: ATRIAL FIBRILLATION ABLATION;  Surgeon: Vickie Epley, MD;  Location: [facility];  Service: Cardiovascular;  Laterality: N/A;   CHOLECYSTECTOMY  2006   COLONOSCOPY WITH PROPOFOL N/A 02/01/2017   Procedure: COLONOSCOPY WITH PROPOFOL;  Surgeon: Toledo, Benay Pike, MD;  Location: [facility];  Service: Gastroenterology;  Laterality: N/A;   ESOPHAGOGASTRODUODENOSCOPY (EGD) WITH PROPOFOL N/A 02/01/2017   Procedure: ESOPHAGOGASTRODUODENOSCOPY (EGD) WITH PROPOFOL;  Surgeon: Toledo, Benay Pike, MD;  Location: [facility];  Service: Gastroenterology;  Laterality: N/A;   HERNIA REPAIR  08/2010   PROSTATECTOMY  2003   Past Surgical History:  Procedure Laterality Date   ATRIAL FIBRILLATION ABLATION N/A 07/30/2021    Procedure: ATRIAL FIBRILLATION ABLATION;  Surgeon: Vickie Epley, MD;  Location: [facility];  Service: Cardiovascular;  Laterality: N/A;   CHOLECYSTECTOMY  2006   COLONOSCOPY WITH PROPOFOL N/A 02/01/2017   Procedure: COLONOSCOPY WITH PROPOFOL;  Surgeon: Toledo, Benay Pike, MD;  Location: [facility];  Service: Gastroenterology;  Laterality: N/A;   ESOPHAGOGASTRODUODENOSCOPY (EGD) WITH PROPOFOL N/A 02/01/2017   Procedure: ESOPHAGOGASTRODUODENOSCOPY (EGD) WITH PROPOFOL;  Surgeon: Toledo, Benay Pike, MD;  Location: [facility];  Service: Gastroenterology;  Laterality: N/A;   HERNIA REPAIR  08/2010   PROSTATECTOMY  2003   Past Medical History:  Diagnosis Date   Dyslipidemia    H/O prostate cancer 05/02/2001   s/p radical prostatectomy (uro Dr. Rush Farmer in [place] yearly)   Headache 05/02/2010   thorough w/u - unrevealing.  has seen 3 neurologists, MRI, LP nonacute.  ESR = 3   HTN (hypertension)    Hypogonadism male    Low testosterone    Migraines    OSA on CPAP    Renal cyst 12/01/2010   complex cyst upper pole L kidney with no malignancy by MRI   Vertigo    Vitamin D deficiency    BP (!) 146/77   Pulse 63   Temp 97.9 F (36.6 C)   Ht 6' (1.829 m)   Wt 196 lb (88.9 kg) Comment: per pt today weight is correct  SpO2 97%   BMI 26.58 kg/m   Opioid Risk Score:   Fall Risk Score:  `1  Depression screen PHQ 2/9     11/15/2021    1:51 PM 08/26/2021    9:30 AM 06/01/2021    9:08 AM 02/12/2021   11:14 AM 11/27/2020    9:57 AM 08/25/2020    9:14 AM 06/09/2020    8:58 AM  Depression screen PHQ 2/9  Decreased Interest 0 0 0 0 0 0 0  Down, Depressed, Hopeless 0 0 0 0 0 0 0  PHQ - 2 Score 0 0 0 0 0 0 0      Review of Systems  Constitutional: Negative.   HENT: Negative.    Eyes: Negative.   Respiratory: Negative.    Cardiovascular: Negative.   Gastrointestinal: Negative.   Endocrine: Negative.   Musculoskeletal:  Positive for neck pain.       Nerve pain , pain in  shoulders ,   Skin: Negative.   Allergic/Immunologic: Negative.   Neurological: Negative.   Hematological: Negative.   Psychiatric/Behavioral: Negative.         Objective:   Physical Exam Vitals and nursing note reviewed. BMI 26.58, BP 146/77, weight 196 lbs Constitutional:      Appearance: Normal appearance.  Cardiovascular:     Rate and  Rhythm: Normal rate and regular rhythm.     Pulses: Normal pulses.     Heart sounds: Normal heart sounds.  Pulmonary:     Effort: Pulmonary effort is normal.     Breath sounds: Normal breath sounds.  Musculoskeletal:     Cervical back: Normal range of motion and neck supple.     Comments: Normal Muscle Bulk and Muscle Testing Reveals:  Upper Extremities: Full ROM and Muscle Strength  5/5 Lower Extremities: Full ROM and Muscle Strength 5/5 Arises from chair with ease Narrow Based  Gait  Tight cervical myofascia Forward protracted posture Skin:    General: Skin is warm and dry. No open lesions over back or shoulders. No sunburn Neurological:     Mental Status: He is alert and oriented to person, place, and time.  Psychiatric:        Mood and Affect: Mood normal.        Behavior: Behavior normal.         Assessment & Plan:   1. 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             Lidoderm patch/ointment, Zyrtec, Tomapax, Almond milk, Elavil, Ketamine infusion (cound not tolerate), hypotherapy, compound medication, hypnosis without benefit             Unable to trial carbamazepine as patient is on Eliquis             Steroids made symptoms more severe             Side effects with Cymbalta, oxcarbazepine, Pamelor             CompletedPhysical  Therapy two days a week. Continue HEP             Continue Lyrica 100 mg TID and Lyrica 50 mg daily as needed for Break through Pain. Continue HEP as Tolerated and Continue to Monitor.   Recommend topical CBD oil- discussed its benefits in reducing inflammation, pain, insomnia, and anxiety.  -Discussed that CBD oil differs from marijuana in that it does not contain THC- the substance that causes euphoria.  -Discussed that it is made from the hemp plant.  -It has been used for thousands of years -preliminary research suggests that if may be able to shrink cancerous tumors, stop plaque formation in Alzheimer's Disease, and slow the progress of brain disease from concussions.  -Additional benefits that have been demonstrated in studies include improved nausea, indigestion, and brain health, and reduced seizures.  -In a survey 92% of patients who tried medical cannabis felt it improved symptoms such as chronic pain, arthritis, migraines, and cancer.    -recommended certain CBD brand  -discussed procedures that could potentially help, but could also make the pain worse.   -Provided with a pain relief journal and discussed that it contains foods and lifestyle tips to naturally help to improve pain. Discussed that these lifestyle strategies are also very good for health unlike some medications which can have negative side effects. Discussed that the act of keeping a journal can be therapeutic and helpful to realize patterns what helps to trigger and alleviate pain.    -  continue B complex supplement.   -Discussed Qutenza as an option for neuropathic pain control. Discussed that this is a capsaicin patch, stronger than capsaicin cream. Discussed that it is currently approved for diabetic peripheral neuropathy and post-herpetic neuralgia, but that it has also shown benefit in treating other forms of neuropathy. Provided patient with link to site to learn more about the patch: [employer]. Discussed  that the patch would be placed in office and benefits usually last 3 months. Discussed that unintended exposure to capsaicin can cause severe irritation of eyes, mucous membranes, respiratory tract, and skin, but that Qutenza is a local treatment and does not have the systemic side effects of other nerve medications. Discussed that there may be pain, itching, erythema, and decreased sensory function associated with the application of Qutenza. Side effects usually subside within 1 week. A cold pack of analgesic medications can help with these side effects. Blood pressure can also be increased due to pain associated with administration of the patch.   2 patches of Qutenza was applied to the area of pain. Ice packs were applied during the procedure to ensure patient comfort. Blood pressure was monitored every 15 minutes. The patient tolerated the procedure well. Post-procedure instructions were given and follow-up has been scheduled.    2, Chronic Pain Syndrome:  Continue Lyrica, d/c Baclofen 5 mg QHS. Continue to Monitor. No reports of drowsiness. Continue to monitor.  -Discussed benefits of exercise in reducing pain. -Discussed following foods that may reduce pain: 1) Ginger (especially studied for arthritis)- reduce leukotriene production to decrease inflammation 2) Blueberries- high in phytonutrients that decrease inflammation 3) Salmon- marine omega-3s reduce joint swelling and pain 4) Pumpkin seeds- reduce inflammation 5) dark chocolate- reduces inflammation 6) turmeric- reduces inflammation 7) tart cherries - reduce pain and stiffness 8) extra virgin olive oil - its compound olecanthal helps to block prostaglandins  9) chili peppers- can be eaten or applied topically via capsaicin 10) mint- helpful for headache, muscle aches, joint pain, and itching 11) garlic- reduces inflammation  Link to further information on diet for chronic pain:  http://www.randall.com/  3. Impaired balance: affected by Lyrica. Dicussed maintaining Lyrica dose at 192m TID 4. Hypersensitive skin: discussed that he may feel increased pain from Qutenza due to this.  5. Insomnia: -Try to go outside near sunrise -Get exercise during the day.  -Turn off all devices an hour before bedtime.  -Teas that can benefit: chamomile, valerian root, Brahmi (Bacopa) -Can consider over the counter melatonin, magnesium, and/or L-theanine. Melatonin is an anti-oxidant with multiple health benefits. Magnesium is involved in greater than 300 enzymatic reactions in the body and most of [place] deficient as our soil is often depleted. There are 7 different types of magnesium- Bioptemizer's is a supplement with all 7 types, and each has unique benefits. Magnesium can also help with constipation and anxiety.  -Pistachios naturally increase the production of melatonin -[employer]zy [employer] bamboo bed sheets are free from toxic chemicals.  -Tart cherry juice or a tart cherry supplement can improve sleep and soreness post-workout   .  F/u w/ me in 3 months for repeat injections

## 2021-11-29 NOTE — Telephone Encounter (Signed)
error 

## 2022-01-06 ENCOUNTER — Encounter: Payer: Self-pay | Admitting: Physical Medicine and Rehabilitation

## 2022-01-07 MED ORDER — PREGABALIN 100 MG PO CAPS: 100.0000 mg | ORAL_CAPSULE | Freq: Three times a day (TID) | ORAL | 3 refills | Status: DC

## 2022-02-03 ENCOUNTER — Ambulatory Visit: Payer: Medicare PPO | Admitting: Physical Medicine and Rehabilitation

## 2022-02-07 ENCOUNTER — Encounter: Payer: Medicare PPO | Attending: Physical Medicine and Rehabilitation | Admitting: Physical Medicine and Rehabilitation

## 2022-02-07 ENCOUNTER — Encounter: Payer: Self-pay | Admitting: Physical Medicine and Rehabilitation

## 2022-02-07 VITALS — BP 134/81 | HR 72 | Ht 72.0 in | Wt 197.0 lb

## 2022-02-07 DIAGNOSIS — M792 Neuralgia and neuritis, unspecified: Secondary | ICD-10-CM | POA: Diagnosis present

## 2022-02-07 DIAGNOSIS — M25511 Pain in right shoulder: Secondary | ICD-10-CM | POA: Insufficient documentation

## 2022-02-07 DIAGNOSIS — M25512 Pain in left shoulder: Secondary | ICD-10-CM | POA: Diagnosis present

## 2022-02-07 DIAGNOSIS — G8929 Other chronic pain: Secondary | ICD-10-CM | POA: Diagnosis present

## 2022-02-07 NOTE — Progress Notes (Addendum)
Subjective:    Patient ID: Evan Robinson, male    DOB: January 23, 1946, 76 y.o.   MRN: [id number]   HPI: Evan Robinson is a 76 y.o. male who presents for f/u of facial pain, shoulder pain, and capelike distribution of upper back and neck pain.   He returns for follow-up appointment for chronic pain and medication refill. He reports he has facial pain and neck pain radiating into his bilateral shoulders He rates his pain 7.His current exercise regime is walking and attending physical therapy two days a week.   His pain has improved since last visit- he has noted improvements with Qutenza -he does feel burning and tingling during the first couple of days after application but this dissapates shortly afterward -he would like to repeat Qutenza today -he would like to try ice packs with application -he does not need refill of lyrica today -he had a good trip with his wife to [place] recently -he plans to travel with her more, moving and staying busy helps his pain He current has no sunburns -he has plans to travel this summer with his wife -he maintains on his prior medications and does not need any further refills today He has burning in left sided facial pain and shoulder It is worst with friction and exacerbated with lying down.  He has been on the Lyrica for years. He is currently 161m BID and he is able to tolerate this dose well. He had dizziness with higher doses He has not had cognitive side effects He did well with the memory recall.  He has never tried CBD oil.  Pain worsens with shaving He tried aloe vera without great benefit He is excited to try Qutenza today- it gave him about 1 month of relief last visit.  He is not sure if he has tried carbamazepine Capsaicin made his skin burn and raw in the past.  -He is interested in trying medical marijuana if it gets approved.     Pain Inventory Average Pain 8 Pain Right Now 7 My pain is burning  In the last 24 hours, has pain  interfered with the following? General activity 7 Relation with others 8 Enjoyment of life 7 What TIME of day is your pain at its worst? evening and night Sleep (in general) Fair  Pain is worse with: sitting Pain improves with: medication Relief from Meds: 4  Family History  Problem Relation Age of Onset   Alcohol abuse Father    Cancer Father        prostate   CAD Father        possible MI   Alcohol abuse Paternal Grandfather    Stroke Mother    Diabetes Neg Hx    Social History   Socioeconomic History   Marital status: Married    Spouse name: Not on file   Number of children: Not on file   Years of education: Not on file   Highest education level: Not on file  Occupational History   Not on file  Tobacco Use   Smoking status: Never   Smokeless tobacco: Never  Vaping Use   Vaping Use: Never used  Substance and Sexual Activity   Alcohol use: No   Drug use: No   Sexual activity: Not on file  Other Topics Concern   Not on file  Social History Narrative   "Evan Robinson"   Lives with wife   Occupation: retired, was CPharmacist, hospital  Activity: walks daily, some  gym   Diet:    Social Determinants of Health   Financial Resource Strain: Not on file  Food Insecurity: Not on file  Transportation Needs: Not on file  Physical Activity: Not on file  Stress: Not on file  Social Connections: Not on file   Past Surgical History:  Procedure Laterality Date   ATRIAL FIBRILLATION ABLATION N/A 07/30/2021   Procedure: [place];  Surgeon: Vickie Epley, MD;  Location: [facility];  Service: Cardiovascular;  Laterality: N/A;   CHOLECYSTECTOMY  2006   COLONOSCOPY WITH PROPOFOL N/A 02/01/2017   Procedure: COLONOSCOPY WITH PROPOFOL;  Surgeon: Toledo, Benay Pike, MD;  Location: [facility];  Service: Gastroenterology;  Laterality: N/A;   ESOPHAGOGASTRODUODENOSCOPY (EGD) WITH PROPOFOL N/A 02/01/2017   Procedure: ESOPHAGOGASTRODUODENOSCOPY (EGD) WITH  PROPOFOL;  Surgeon: Toledo, Benay Pike, MD;  Location: [facility];  Service: Gastroenterology;  Laterality: N/A;   HERNIA REPAIR  08/2010   PROSTATECTOMY  2003   Past Surgical History:  Procedure Laterality Date   ATRIAL FIBRILLATION ABLATION N/A 07/30/2021   Procedure: ATRIAL FIBRILLATION ABLATION;  Surgeon: Vickie Epley, MD;  Location: [facility];  Service: Cardiovascular;  Laterality: N/A;   CHOLECYSTECTOMY  2006   COLONOSCOPY WITH PROPOFOL N/A 02/01/2017   Procedure: COLONOSCOPY WITH PROPOFOL;  Surgeon: Toledo, Benay Pike, MD;  Location: [facility];  Service: Gastroenterology;  Laterality: N/A;   ESOPHAGOGASTRODUODENOSCOPY (EGD) WITH PROPOFOL N/A 02/01/2017   Procedure: ESOPHAGOGASTRODUODENOSCOPY (EGD) WITH PROPOFOL;  Surgeon: Toledo, Benay Pike, MD;  Location: [facility];  Service: Gastroenterology;  Laterality: N/A;   HERNIA REPAIR  08/2010   PROSTATECTOMY  2003   Past Medical History:  Diagnosis Date   Dyslipidemia    H/O prostate cancer 05/02/2001   s/p radical prostatectomy (uro Dr. Rush Farmer in [place] yearly)   Headache 05/02/2010   thorough w/u - unrevealing.  has seen 3 neurologists, MRI, LP nonacute.  ESR = 3   HTN (hypertension)    Hypogonadism male    Low testosterone    Migraines    OSA on CPAP    Renal cyst 12/01/2010   complex cyst upper pole L kidney with no malignancy by MRI   Vertigo    Vitamin D deficiency    BP 134/81   Pulse 72   Ht 6' (1.829 m)   Wt 197 lb (89.4 kg)   BMI 26.72 kg/m   Opioid Risk Score:   Fall Risk Score:  `1  Depression screen PHQ 2/9     02/07/2022   10:16 AM 11/15/2021    1:51 PM 08/26/2021    9:30 AM 06/01/2021    9:08 AM 02/12/2021   11:14 AM 11/27/2020    9:57 AM 08/25/2020    9:14 AM  Depression screen PHQ 2/9  Decreased Interest 0 0 0 0 0 0 0  Down, Depressed, Hopeless 0 0 0 0 0 0 0  PHQ - 2 Score 0 0 0 0 0 0 0      Review of Systems  Constitutional: Negative.   HENT: Negative.    Eyes:  Negative.   Respiratory: Negative.    Cardiovascular: Negative.   Gastrointestinal: Negative.   Endocrine: Negative.   Musculoskeletal:  Positive for neck pain.       Nerve pain , pain in shoulders ,   Skin: Negative.   Allergic/Immunologic: Negative.   Neurological: Negative.   Hematological: Negative.   Psychiatric/Behavioral: Negative.         Objective:  Physical Exam Vitals and nursing note reviewed. BMI 26.58, BP 134/81, weight 197 lbs Constitutional:      Appearance: Normal appearance.  Cardiovascular:     Rate and Rhythm: Normal rate and regular rhythm.     Pulses: Normal pulses.     Heart sounds: Normal heart sounds.  Pulmonary:     Effort: Pulmonary effort is normal.     Breath sounds: Normal breath sounds.  Musculoskeletal:     Cervical back: Normal range of motion and neck supple.     Comments: Normal Muscle Bulk and Muscle Testing Reveals:  Upper Extremities: Full ROM and Muscle Strength  5/5 Lower Extremities: Full ROM and Muscle Strength 5/5 Arises from chair with ease Narrow Based  Gait  Tight cervical myofascia Forward protracted posture Skin:    General: Skin is warm and dry. No open lesions over back or shoulders. No sunburn Neurological:     Mental Status: He is alert and oriented to person, place, and time.  Psychiatric:        Mood and Affect: Mood normal.        Behavior: Behavior normal.         Assessment & Plan:   1. 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             Lidoderm patch/ointment, Zyrtec, Tomapax, Almond milk, Elavil, Ketamine infusion (cound not tolerate), hypotherapy, compound medication, hypnosis without benefit             Unable to  trial carbamazepine as patient is on Eliquis             Steroids made symptoms more severe             Side effects with Cymbalta, oxcarbazepine, Pamelor             CompletedPhysical Therapy two days a week. Continue HEP             Continue Lyrica 100 mg TID and Lyrica 50 mg daily as needed for Break through Pain. Continue HEP as Tolerated and Continue to Monitor.   Recommend topical CBD oil- discussed its benefits in reducing inflammation, pain, insomnia, and anxiety.  -Discussed that CBD oil differs from marijuana in that it does not contain THC- the substance that causes euphoria.  -Discussed that it is made from the hemp plant.  -It has been used for thousands of years -preliminary research suggests that if may be able to shrink cancerous tumors, stop plaque formation in Alzheimer's Disease, and slow the progress of brain disease from concussions.  -Additional benefits that have been demonstrated in studies include improved nausea, indigestion, and brain health, and reduced seizures.  -In a survey 92% of patients who tried medical cannabis felt it improved symptoms such as chronic pain, arthritis, migraines, and cancer.    -recommended certain CBD brand  -discussed procedures that could potentially help, but could also make the pain worse.   -Provided with a pain relief journal and discussed that it contains foods and lifestyle tips to naturally help to improve pain. Discussed that these lifestyle strategies are also very good for health unlike some medications which  can have negative side effects. Discussed that the act of keeping a journal can be therapeutic and helpful to realize patterns what helps to trigger and alleviate pain.    -continue B complex supplement.   -Discussed Qutenza as an option for neuropathic pain control. Discussed that this is a capsaicin patch, stronger than capsaicin cream. Discussed that it is currently approved for diabetic peripheral neuropathy and post-herpetic  neuralgia, but that it has also shown benefit in treating other forms of neuropathy. Provided patient with link to site to learn more about the patch: [employer].fr. Discussed that the patch would be placed in office and benefits usually last 3 months. Discussed that unintended exposure to capsaicin can cause severe irritation of eyes, mucous membranes, respiratory tract, and skin, but that Qutenza is a local treatment and does not have the systemic side effects of other nerve medications. Discussed that there may be pain, itching, erythema, and decreased sensory function associated with the application of Qutenza. Side effects usually subside within 1 week. A cold pack of analgesic medications can help with these side effects. Blood pressure can also be increased due to pain associated with administration of the patch.   2 patches of Qutenza was applied to the area of pain. Ice packs were applied during the procedure to ensure patient comfort. Blood pressure was monitored every 15 minutes. The patient tolerated the procedure well. Post-procedure instructions were given and follow-up has been scheduled.    2, Chronic Pain Syndrome:  Continue Lyrica, d/c Baclofen 5 mg QHS. Continue to Monitor. No reports of drowsiness. Continue to monitor.  -Discussed benefits of exercise in reducing pain. -prescribed Zynex cervical traction device and heating blanket -Discussed following foods that may reduce pain: 1) Ginger (especially studied for arthritis)- reduce leukotriene production to decrease inflammation 2) Blueberries- high in phytonutrients that decrease inflammation 3) Salmon- marine omega-3s reduce joint swelling and pain 4) Pumpkin seeds- reduce inflammation 5) dark chocolate- reduces inflammation 6) turmeric- reduces inflammation 7) tart cherries - reduce pain and stiffness 8) extra virgin olive oil - its compound olecanthal helps to block prostaglandins  9) chili peppers- can be eaten or  applied topically via capsaicin 10) mint- helpful for headache, muscle aches, joint pain, and itching 11) garlic- reduces inflammation  Link to further information on diet for chronic pain: http://www.randall.com/  3. Impaired balance: affected by Lyrica. Dicussed maintaining Lyrica dose at 150m TID 4. Hypersensitive skin: discussed that he may feel increased pain from Qutenza due to this.  5. Insomnia: -Try to go outside near sunrise -Get exercise during the day.  -Turn off all devices an hour before bedtime.  -Teas that can benefit: chamomile, valerian root, Brahmi (Bacopa) -Can consider over the counter melatonin, magnesium, and/or L-theanine. Melatonin is an anti-oxidant with multiple health benefits. Magnesium is involved in greater than 300 enzymatic reactions in the body and most of [place] deficient as our soil is often depleted. There are 7 different types of magnesium- Bioptemizer's is a supplement with all 7 types, and each has unique benefits. Magnesium can also help with constipation and anxiety.  -Pistachios naturally increase the production of melatonin -Cozy Earth bamboo bed sheets are free from toxic chemicals.  -Tart cherry juice or a tart cherry supplement can improve sleep and soreness post-workout   6. Overweight BMI 26.72, weight 197 lbs -discussed that capsaicin has been associated with weight loss  F/u w/ me in 3 months for repeat injections

## 2022-02-27 ENCOUNTER — Emergency Department (HOSPITAL_BASED_OUTPATIENT_CLINIC_OR_DEPARTMENT_OTHER): Payer: Medicare PPO

## 2022-02-27 ENCOUNTER — Encounter (HOSPITAL_BASED_OUTPATIENT_CLINIC_OR_DEPARTMENT_OTHER): Payer: Self-pay | Admitting: Emergency Medicine

## 2022-02-27 ENCOUNTER — Other Ambulatory Visit: Payer: Self-pay

## 2022-02-27 ENCOUNTER — Emergency Department (HOSPITAL_BASED_OUTPATIENT_CLINIC_OR_DEPARTMENT_OTHER)
Admission: EM | Admit: 2022-02-27 | Discharge: 2022-02-27 | Disposition: A | Discharge status: Home / Self Care | Payer: Medicare PPO | Attending: Emergency Medicine | Admitting: Emergency Medicine

## 2022-02-27 DIAGNOSIS — M7981 Nontraumatic hematoma of soft tissue: Secondary | ICD-10-CM | POA: Insufficient documentation

## 2022-02-27 DIAGNOSIS — T148XXA Other injury of unspecified body region, initial encounter: Secondary | ICD-10-CM

## 2022-02-27 DIAGNOSIS — Z7901 Long term (current) use of anticoagulants: Secondary | ICD-10-CM | POA: Insufficient documentation

## 2022-02-27 DIAGNOSIS — M7989 Other specified soft tissue disorders: Secondary | ICD-10-CM | POA: Diagnosis not present

## 2022-02-27 NOTE — ED Triage Notes (Addendum)
Pt arrives pov, steady gait c/o LT arm pain, endorses concern for DVT. Bruising noted, denies injury. Pt endorses thinners

## 2022-02-27 NOTE — ED Provider Notes (Signed)
[facility] EMERGENCY DEPARTMENT Provider Note   CSN: [id number] Arrival date & time: 02/27/22  1413     History  Chief Complaint  Patient presents with   Arm Pain    Evan Robinson is a 76 y.o. male.  Patient here with bruise to the left arm.  He is on blood thinner.  Not sure of any obvious injury.  But he noticed bruising in the left forearm.  He is concerned for blood clot.  No weakness or numbness or a lot of pain.  He has been icing it with improvement.  He has a history of A-fib on Eliquis.  Denies any numbness, weakness, tingling.  The history is provided by the patient.       Home Medications Prior to Admission medications   Medication Sig Start Date End Date Taking? Authorizing Provider  ALPRAZolam Prudy Feeler) 0.5 MG tablet Take 0.5 mg by mouth 2 (two) times daily as needed for anxiety. 09/22/20   [provider]  b complex vitamins capsule Take 1 capsule by mouth daily.    [provider]  Carboxymethylcellul-Glycerin (CLEAR EYES FOR DRY EYES OP) Place 1 drop into both eyes daily as needed (Dry eyes).    [provider]  Cholecalciferol (VITAMIN D) 50 MCG (2000 UT) CAPS Take 2,000 Units by mouth daily.    [provider]  diltiazem (CARDIZEM CD) 120 MG 24 hr capsule Take 120 mg by mouth daily. 08/13/19   [provider]  diltiazem (CARDIZEM) 30 MG tablet Take 1 Tablet Every 4 Hours As Needed For HR >100 and Top BP>100 03/24/21   Newman Nip, NP  ELIQUIS 5 MG TABS tablet Take 5 mg by mouth 2 (two) times daily. 11/29/19   [provider]  gemfibrozil (LOPID) 600 MG tablet Take 600 mg by mouth daily before breakfast.    [provider]  hydrochlorothiazide (HYDRODIURIL) 25 MG tablet Take 25 mg by mouth daily. 11/11/19   [provider]  HYDROcodone-acetaminophen (NORCO/VICODIN) 5-325 MG per tablet Take 1 tablet by mouth every 6 (six) hours as needed (headache).    [provider]   losartan (COZAAR) 25 MG tablet Take 25 mg by mouth daily.    [provider]  meclizine (ANTIVERT) 25 MG tablet Take 1 tablet (25 mg total) by mouth 3 (three) times daily as needed for dizziness. 10/04/21   Tegeler, Canary Brim, MD  pregabalin (LYRICA) 100 MG capsule Take 1 capsule (100 mg total) by mouth 3 (three) times daily. 01/07/22   Raulkar, Drema Pry, MD  Rimegepant Sulfate (NURTEC) 75 MG TBDP Take 1 tablet by mouth every other day. 10/13/21   Raulkar, Drema Pry, MD  SUMAtriptan (IMITREX) 50 MG tablet Take 1 tablet (50 mg total) by mouth once as needed for migraine. May repeat in 2 hours if headache persists or recurs. 10/15/21 10/16/22  Horton Chin, MD  vitamin C (ASCORBIC ACID) 500 MG tablet Take 500 mg by mouth daily.    [provider]  zolpidem (AMBIEN) 10 MG tablet Take 1 tablet by mouth at bedtime. 12/27/21   [provider]      Allergies    Fenofibrate    Review of Systems   Review of Systems  Physical Exam Updated Vital Signs BP (!) 138/93 (BP Location: Right Arm)   Pulse (!) 59   Temp 97.7 F (36.5 C) (Oral)   Resp 16   Ht 6' (1.829 m)   Wt 88.5 kg  SpO2 100%   BMI 26.45 kg/m  Physical Exam Vitals and nursing note reviewed.  Constitutional:      General: He is not in acute distress.    Appearance: He is well-developed.  HENT:     Head: Normocephalic and atraumatic.  Eyes:     Conjunctiva/sclera: Conjunctivae normal.  Cardiovascular:     Rate and Rhythm: Normal rate and regular rhythm.     Pulses: Normal pulses.     Heart sounds: No murmur heard. Pulmonary:     Effort: Pulmonary effort is normal. No respiratory distress.     Breath sounds: Normal breath sounds.  Abdominal:     Palpations: Abdomen is soft.     Tenderness: There is no abdominal tenderness.  Musculoskeletal:        General: No swelling or tenderness.     Cervical back: Neck supple.     Comments: Bruising to the left forearm  Skin:    General: Skin is warm  and dry.     Capillary Refill: Capillary refill takes less than 2 seconds.  Neurological:     General: No focal deficit present.     Mental Status: He is alert.     Sensory: No sensory deficit.     Motor: No weakness.  Psychiatric:        Mood and Affect: Mood normal.     ED Results / Procedures / Treatments   Labs (all labs ordered are listed, but only abnormal results are displayed) Labs Reviewed - No data to display  EKG None  Radiology US Venous Img Upper Left (DVT Study)  Result Date: 02/27/2022 CLINICAL DATA:  Bruising and swelling. EXAM: LEFT UPPER EXTREMITY VENOUS DOPPLER ULTRASOUND TECHNIQUE: Gray-scale sonography with graded compression, as well as color Doppler and duplex ultrasound were performed to evaluate the upper extremity deep venous system from the level of the subclavian vein and including the jugular, axillary, basilic, radial, ulnar and upper cephalic vein. Spectral Doppler was utilized to evaluate flow at rest and with distal augmentation maneuvers. COMPARISON:  None Available. FINDINGS: Contralateral Subclavian Vein: Respiratory phasicity is normal and symmetric with the symptomatic side. No evidence of thrombus. Normal compressibility. Internal Jugular Vein: No evidence of thrombus. Normal compressibility, respiratory phasicity and response to augmentation. Subclavian Vein: No evidence of thrombus. Normal compressibility, respiratory phasicity and response to augmentation. Axillary Vein: No evidence of thrombus. Normal compressibility, respiratory phasicity and response to augmentation. Cephalic Vein: No evidence of thrombus. Normal compressibility, respiratory phasicity and response to augmentation. Basilic Vein: No evidence of thrombus. Normal compressibility, respiratory phasicity and response to augmentation. Brachial Veins: No evidence of thrombus. Normal compressibility, respiratory phasicity and response to augmentation. Radial Veins: No evidence of thrombus.  Normal compressibility, respiratory phasicity and response to augmentation. Ulnar Veins: No evidence of thrombus. Normal compressibility, respiratory phasicity and response to augmentation. Venous Reflux:  None visualized. Other Findings: A mass with mixed echogenicity appears to be intramuscular in the region of the patient's symptoms measuring 13 x 11 x 11 mm. IMPRESSION: 1. No evidence of DVT within the left upper extremity. 2. A mass with mixed echogenicity appears to be intramuscular in the region of the patient's symptoms measuring 13 x 11 x 11 mm. This could represent an intramuscular hematoma given history, but other etiologies are not excluded. Electronically Signed   By: Dorise Bullion III M.D.   On: 02/27/2022 15:42    Procedures Procedures    Medications Ordered in ED Medications - No data to display  ED Course/ Medical Decision Making/ A&P                           Medical Decision Making  Evan Robinson is here with bruising to the left forearm.  He is on Eliquis for A-fib.  Normal vitals.  No fever.  Neurovascular neuromuscular intact.  He is not very tender in this area.  Ultrasound was performed that showed intramuscular hematoma.  No DVT.  I have no concern for bony injury as this is over the soft tissue of the forearm.  Suspect he most of hit his arm and started to have a small hematoma as he is on blood thinner.  He is neurovascular neuromuscular intact.  He has been using ice.  Recommend continued conservative treatment.  Discharged in good condition.  Understands return precautions.  This chart was dictated using voice recognition software.  Despite best efforts to proofread,  errors can occur which can change the documentation meaning.         Final Clinical Impression(s) / ED Diagnoses Final diagnoses:  Hematoma    Rx / DC Orders ED Discharge Orders     None         Virgina Norfolk, DO 02/27/22 1607

## 2022-03-30 NOTE — Progress Notes (Unsigned)
Cardiology Office Note Date:  03/30/2022  Patient ID:  Evan Robinson, DOB 08/28/1945, MRN [id number] PCP:  Henrietta Dine, MD  Electrophysiologist: Dr. Quentin Ore  ***refresh   Chief Complaint: *** 6 mo   History of Present Illness: Evan Robinson is a 76 y.o. male with history of HTN, HLD, chronic facial pain with flushing, burning and allodynia, OSA, AFib, sinus bradycardia.  Recently relocated to [place], from [place], [place] visit locally in Jan this year referred to Afib clinic for increased palpitations. Ultimately referred to Dr. Quentin Ore and underwent PVI ablation 07/30/21.  He last saw Dr. Quentin Ore 11/03/21, doing well post PVI with no sustained palpitations/AFib.  Flecainide was stopped. Discussed stopping Eliquis in 71moif no symptoms of AFib as long as he had a reliable method of rhythm monitoring, would then start ASA.  *** symptoms, burden *** eliquis, bleeding, dose, labs *** watch?  Rhythm monitoring?   AFib/AAD hx Diagnosed 2020 Flecainide started Sept 2020 >> stopped July 2023 post ablation PVI ablation 07/30/21  Past Medical History:  Diagnosis Date   Dyslipidemia    H/O prostate cancer 05/02/2001   s/p radical prostate[place]   Headache 05/02/2010   thorough w/u - unrevealing.  has seen 3 neurologists, MRI, LP nonacute.  ESR = 3   HTN (hypertension)    Hypogonadism male    Low testosterone    Migraines    OSA on CPAP    Renal cyst 12/01/2010   complex cyst upper pole L kidney with no malignancy by MRI   Vertigo    Vitamin D deficiency     Past Surgical History:  Procedure Laterality Date   ATRIAL FIBRILLATION ABLATION N/A 07/30/2021   Procedure: ATRIAL FIBRILLATION ABLATION;  Surgeon: LVickie Epley MD;  Location[facility];  Service: Cardiovascular;  Laterality: N/A;   CHOLECYSTECTOMY  2006   COLONOSCOPY WITH PROPOFOL N/A 02/01/2017   Procedure: COLONOSCOPY WITH PROPOFOL;  Surgeon: Toledo, TBenay Pike  MD;  Location: [facility];  Service: Gastroenterology;  Laterality: N/A;   ESOPHAGOGASTRODUODENOSCOPY (EGD) WITH PROPOFOL N/A 02/01/2017   Procedure: ESOPHAGOGASTRODUODENOSCOPY (EGD) WITH PROPOFOL;  Surgeon: Toledo, TBenay Pike MD;  Location: [facility];  Service: Gastroenterology;  Laterality: N/A;   HERNIA REPAIR  08/2010   PROSTATECTOMY  2003    Current Outpatient Medications  Medication Sig Dispense Refill   ALPRAZolam (XANAX) 0.5 MG tablet Take 0.5 mg by mouth 2 (two) times daily as needed for anxiety.     b complex vitamins capsule Take 1 capsule by mouth daily.     Carboxymethylcellul-Glycerin (CLEAR EYES FOR DRY EYES OP) Place 1 drop into both eyes daily as needed (Dry eyes).     Cholecalciferol (VITAMIN D) 50 MCG (2000 UT) CAPS Take 2,000 Units by mouth daily.     diltiazem (CARDIZEM CD) 120 MG 24 hr capsule Take 120 mg by mouth daily.     diltiazem (CARDIZEM) 30 MG tablet Take 1 Tablet Every 4 Hours As Needed For HR >100 and Top BP>100 45 tablet 1   ELIQUIS 5 MG TABS tablet Take 5 mg by mouth 2 (two) times daily.     gemfibrozil (LOPID) 600 MG tablet Take 600 mg by mouth daily before breakfast.     hydrochlorothiazide (HYDRODIURIL) 25 MG tablet Take 25 mg by mouth daily.     HYDROcodone-acetaminophen (NORCO/VICODIN) 5-325 MG per tablet Take 1 tablet by mouth every 6 (six) hours as needed (headache).  losartan (COZAAR) 25 MG tablet Take 25 mg by mouth daily.     meclizine (ANTIVERT) 25 MG tablet Take 1 tablet (25 mg total) by mouth 3 (three) times daily as needed for dizziness. 30 tablet 0   pregabalin (LYRICA) 100 MG capsule Take 1 capsule (100 mg total) by mouth 3 (three) times daily. 90 capsule 3   Rimegepant Sulfate (NURTEC) 75 MG TBDP Take 1 tablet by mouth every other day. 8 tablet 3   SUMAtriptan (IMITREX) 50 MG tablet Take 1 tablet (50 mg total) by mouth once as needed for migraine. May repeat in 2 hours if headache persists or recurs. 30 tablet 2   vitamin C  (ASCORBIC ACID) 500 MG tablet Take 500 mg by mouth daily.     zolpidem (AMBIEN) 10 MG tablet Take 1 tablet by mouth at bedtime.     No current facility-administered medications for this visit.    Allergies:   Fenofibrate   Social History:  The patient  reports that he has never smoked. He has never used smokeless tobacco. He reports that he does not drink alcohol and does not use drugs.   Family History:  The patient's family history includes Alcohol abuse in his father and paternal grandfather; CAD in his father; Cancer in his father; Stroke in his mother.  ROS:  Please see the history of present illness.    All other systems are reviewed and otherwise negative.   PHYSICAL EXAM:  VS:  There were no vitals taken for this visit. BMI: There is no height or weight on file to calculate BMI. Well nourished, well developed, in no acute distress HEENT: normocephalic, atraumatic Neck: no JVD, carotid bruits or masses Cardiac:  *** RRR; no significant murmurs, no rubs, or gallops Lungs:  *** CTA b/l, no wheezing, rhonchi or rales Abd: soft, nontender MS: no deformity or *** atrophy Ext: *** no edema Skin: warm and dry, no rash Neuro:  No gross deficits appreciated Psych: euthymic mood, full affect   EKG:  Done today and reviewed by myself shows  ***   07/30/21: EPS/ablation CONCLUSIONS: 1. Successful PVI 2. Intracardiac echo reveals trivial pericardial effusion and normal LV function. Normal LA architecture. 3. No early apparent complications. 4. Colchicine 0.24m PO BID x 5 days 5. Protonix 416mPO daily x 45 days   07/23/21: cardiac CT IMPRESSION: 1. There is normal pulmonary vein drainage into the left atrium with ostial measurements above. 2. There is no thrombus in the left atrial appendage. 3. The esophagus runs in the left atrial midline and is not in proximity to any of the pulmonary vein ostia. 4. No PFO/ASD. 5. Normal coronary origin. Right dominance. 6. CAC score of  526 which is 8123ercentile for age-, race-, and sex-matched controls.     05/06/21: TTE  1. Left ventricular ejection fraction, by estimation, is 50 to 55%. Left  ventricular ejection fraction by 3D volume is 48 %. The left ventricle has  mildly decreased function. The left ventricle has no regional wall motion  abnormalities. The left  ventricular internal cavity size was mildly dilated. Left ventricular  diastolic parameters are consistent with Grade I diastolic dysfunction  (impaired relaxation).   2. Right ventricular systolic function is normal. The right ventricular  size is normal. There is normal pulmonary artery systolic pressure.   3. The mitral valve is normal in structure. Mild to moderate mitral valve  regurgitation. No evidence of mitral stenosis. There is moderate  holosystolic prolapse of  both leaflets of the mitral valve.   4. The aortic valve is normal in structure. There is mild calcification  of the aortic valve. Aortic valve regurgitation is not visualized. Aortic  valve sclerosis/calcification is present, without any evidence of aortic  stenosis.   5. The inferior vena cava is normal in size with greater than 50%  respiratory variability, suggesting right atrial pressure of 3 mmHg.   Recent Labs: 10/04/2021: ALT 10; BUN 12; Creatinine, Ser 1.00; Hemoglobin 15.3; Platelets 180; Potassium 3.7; Sodium 134  No results found for requested labs within last 365 days.   CrCl cannot be calculated (Patient's most recent lab result is older than the maximum 21 days allowed.).   Wt Readings from Last 3 Encounters:  02/27/22 195 lb (88.5 kg)  02/07/22 197 lb (89.4 kg)  11/15/21 196 lb (88.9 kg)     Other studies reviewed: Additional studies/records reviewed today include: summarized above  ASSESSMENT AND PLAN:  Paroxysmal AFib CHA2DS2Vasc is 3, on Eliquis, *** appropriately dosed *** burden by symptoms ***  HTN ***   Disposition: F/u with ***  Current  medicines are reviewed at length with the patient today.  The patient did not have any concerns regarding medicines.  Venetia Night, PA-C 03/30/2022 5:54 AM     [address] [place] [id number] [phone] (office)  [phone] (fax)

## 2022-03-31 ENCOUNTER — Encounter: Payer: Self-pay | Admitting: Physician Assistant

## 2022-03-31 ENCOUNTER — Ambulatory Visit: Payer: Medicare PPO | Attending: Physician Assistant | Admitting: Physician Assistant

## 2022-03-31 VITALS — BP 138/76 | HR 64 | Ht 72.0 in | Wt 198.0 lb

## 2022-03-31 DIAGNOSIS — Z79899 Other long term (current) drug therapy: Secondary | ICD-10-CM | POA: Diagnosis not present

## 2022-03-31 DIAGNOSIS — I480 Paroxysmal atrial fibrillation: Secondary | ICD-10-CM

## 2022-03-31 DIAGNOSIS — I10 Essential (primary) hypertension: Secondary | ICD-10-CM | POA: Diagnosis not present

## 2022-03-31 DIAGNOSIS — Z5181 Encounter for therapeutic drug level monitoring: Secondary | ICD-10-CM | POA: Diagnosis not present

## 2022-03-31 MED ORDER — FLECAINIDE ACETATE 50 MG PO TABS: 50.0000 mg | ORAL_TABLET | Freq: Two times a day (BID) | ORAL | 3 refills | Status: DC

## 2022-03-31 NOTE — Patient Instructions (Signed)
Medication Instructions:   START TAKING:  FLECAINIDE 50 MG TWICE A DAY    *If you need a refill on your cardiac medications before your next appointment, please call your pharmacy*   Lab Work: NONE ORDERED  TODAY    If you have labs (blood work) drawn today and your tests are completely normal, you will receive your results only by: MyChart Message (if you have MyChart) OR A paper copy in the mail If you have any lab test that is abnormal or we need to change your treatment, we will call you to review the results.   Testing/Procedures: NONE ORDERED  TODAY     Follow-Up: At [facility] you and your health needs are our priority.  As part of our continuing mission to provide you with exceptional heart care, we have created designated Provider Care Teams.  These Care Teams include your primary Cardiologist (physician) and Advanced Practice Providers (APPs -  Physician Assistants and Nurse Practitioners) who all work together to provide you with the care you need, when you need it.  We recommend signing up for the patient portal called "MyChart".  Sign up information is provided on this After Visit Summary.  MyChart is used to connect with patients for Virtual Visits (Telemedicine).  Patients are able to view lab/test results, encounter notes, upcoming appointments, etc.  Non-urgent messages can be sent to your provider as well.   To learn more about what you can do with MyChart, go to [email or web address].    Your next appointment:   3 month(s)  The format for your next appointment:   In Person  Provider:   Steffanie Dunn, MD    Other Instructions   Important Information About Sugar

## 2022-05-13 ENCOUNTER — Encounter: Payer: Medicare PPO | Attending: Physical Medicine and Rehabilitation | Admitting: Physical Medicine and Rehabilitation

## 2022-05-13 VITALS — BP 117/78 | HR 64 | Ht 72.0 in | Wt 194.0 lb

## 2022-05-13 DIAGNOSIS — M792 Neuralgia and neuritis, unspecified: Secondary | ICD-10-CM | POA: Insufficient documentation

## 2022-05-13 DIAGNOSIS — E663 Overweight: Secondary | ICD-10-CM | POA: Insufficient documentation

## 2022-05-13 DIAGNOSIS — R2689 Other abnormalities of gait and mobility: Secondary | ICD-10-CM | POA: Diagnosis not present

## 2022-05-13 MED ORDER — CAPSAICIN-CLEANSING GEL 8 % EX KIT
2.0000 | PACK | Freq: Once | CUTANEOUS | Status: AC
  Administered 2022-05-13: 2 via TOPICAL

## 2022-05-13 MED ORDER — PREGABALIN 100 MG PO CAPS: 100.0000 mg | ORAL_CAPSULE | Freq: Three times a day (TID) | ORAL | 3 refills | Status: DC

## 2022-05-13 NOTE — Progress Notes (Signed)
Subjective:    Patient ID: Evan Robinson, male    DOB: 12-06-1945, 77 y.o.   MRN: [id number]   HPI: Evan Robinson is a 77 y.o. male who presents for f/u of facial pain, shoulder pain, and capelike distribution of upper back and neck pain.   He returns for follow-up appointment for chronic pain and medication refill. He reports he has facial pain and neck pain radiating into his bilateral shoulders He rates his pain 7.His current exercise regime is walking and attending physical therapy two days a week.   His pain has improved since last visit- he has noted improvements with Qutenza -he does feel burning and tingling during the first couple of days after application but this dissapates shortly afterward -he would like to repeat Qutenza today -he would like to try ice packs with application -he does not need refill of lyrica today -he had a good trip with his wife to [place] recently -he plans to travel with her more, moving and staying busy helps his pain He current has no sunburns -he has plans to travel this summer with his wife -he maintains on his prior medications and does not need any further refills today He has burning in left sided facial pain and shoulder It is worst with friction and exacerbated with lying down.  He has been on the Lyrica for years. He is currently 100mg  BID and he is able to tolerate this dose well. He had dizziness with higher doses He has not had cognitive side effects He did well with the memory recall.  He has never tried CBD oil.  Pain worsens with shaving He tried aloe vera without great benefit He is excited to try Qutenza today- it gave him about 1 month of relief last visit.  He is not sure if he has tried carbamazepine Capsaicin made his skin burn and raw in the past.  -He is interested in trying medical marijuana if it gets approved.  -He asks for a refill of Lyrica -He is ready for Qutenza today -Had a good Christmas and New Year's Family   -he does have some dizziness -had too many side effects from gabapentin    Pain Inventory Average Pain 8 Pain Right Now 7 My pain is burning  In the last 24 hours, has pain interfered with the following? General activity 7 Relation with others 8 Enjoyment of life 7 What TIME of day is your pain at its worst? evening and night Sleep (in general) Fair  Pain is worse with: sitting Pain improves with: medication Relief from Meds: 4  Family History  Problem Relation Age of Onset   Alcohol abuse Father    Cancer Father        prostate   CAD Father        possible MI   Alcohol abuse Paternal Grandfather    Stroke Mother    Diabetes Neg Hx    Social History   Socioeconomic History   Marital status: Married    Spouse name: Not on file   Number of children: Not on file   Years of education: Not on file   Highest education level: Not on file  Occupational History   Not on file  Tobacco Use   Smoking status: Never   Smokeless tobacco: Never  Vaping Use   Vaping Use: Never used  Substance and Sexual Activity   Alcohol use: No   Drug use: No   Sexual activity: Not on file  Other  Topics Concern   Not on file  Social History Narrative   "Evan Robinson"   Lives with wife   Occupation: retired, was Pharmacist, hospital   Activity: walks daily, some gym   Diet:    Social Determinants of Health   Financial Resource Strain: Not on file  Food Insecurity: Not on file  Transportation Needs: Not on file  Physical Activity: Not on file  Stress: Not on file  Social Connections: Not on file   Past Surgical History:  Procedure Laterality Date   [place]ium N/A 07/30/2021   Procedure: [place];  Surgeon: Vickie Epley, MD;  Location: [facility];  Service: Cardiovascular;  Laterality: N/A;   CHOLECYSTECTOMY  2006   COLONOSCOPY WITH PROPOFOL N/A 02/01/2017   Procedure: COLONOSCOPY WITH PROPOFOL;  Surgeon: Toledo, Benay Pike, MD;  Location:  [facility];  Service: Gastroenterology;  Laterality: N/A;   ESOPHAGOGASTRODUODENOSCOPY (EGD) WITH PROPOFOL N/A 02/01/2017   Procedure: ESOPHAGOGASTRODUODENOSCOPY (EGD) WITH PROPOFOL;  Surgeon: Toledo, Benay Pike, MD;  Location: [facility];  Service: Gastroenterology;  Laterality: N/A;   HERNIA REPAIR  08/2010   PROSTATECTOMY  2003   Past Surgical History:  Procedure Laterality Date   ATRIAL FIBRILLATION ABLATION N/A 07/30/2021   Procedure: ATRIAL FIBRILLATION ABLATION;  Surgeon: Vickie Epley, MD;  Location: [facility];  Service: Cardiovascular;  Laterality: N/A;   CHOLECYSTECTOMY  2006   COLONOSCOPY WITH PROPOFOL N/A 02/01/2017   Procedure: COLONOSCOPY WITH PROPOFOL;  Surgeon: Toledo, Benay Pike, MD;  Location: [facility];  Service: Gastroenterology;  Laterality: N/A;   ESOPHAGOGASTRODUODENOSCOPY (EGD) WITH PROPOFOL N/A 02/01/2017   Procedure: ESOPHAGOGASTRODUODENOSCOPY (EGD) WITH PROPOFOL;  Surgeon: Toledo, Benay Pike, MD;  Location: [facility];  Service: Gastroenterology;  Laterality: N/A;   HERNIA REPAIR  08/2010   PROSTATECTOMY  2003   Past Medical History:  Diagnosis Date   Dyslipidemia    H/O prostate cancer 05/02/2001   s/p radical prostatectomy (uro Dr. Rush Farmer in [place] yearly)   Headache 05/02/2010   thorough w/u - unrevealing.  has seen 3 neurologists, MRI, LP nonacute.  ESR = 3   HTN (hypertension)    Hypogonadism male    Low testosterone    Migraines    OSA on CPAP    Renal cyst 12/01/2010   complex cyst upper pole L kidney with no malignancy by MRI   Vertigo    Vitamin D deficiency    There were no vitals taken for this visit.  Opioid Risk Score:   Fall Risk Score:  `1  Depression screen PHQ 2/9     02/07/2022   10:16 AM 11/15/2021    1:51 PM 08/26/2021    9:30 AM 06/01/2021    9:08 AM 02/12/2021   11:14 AM 11/27/2020    9:57 AM 08/25/2020    9:14 AM  Depression screen PHQ 2/9  Decreased Interest 0 0 0 0 0 0 0  Down, Depressed,  Hopeless 0 0 0 0 0 0 0  PHQ - 2 Score 0 0 0 0 0 0 0      Review of Systems  Constitutional: Negative.   HENT: Negative.    Eyes: Negative.   Respiratory: Negative.    Cardiovascular: Negative.   Gastrointestinal: Negative.   Endocrine: Negative.   Musculoskeletal:  Positive for neck pain.       Nerve pain , pain in shoulders ,   Skin: Negative.   Allergic/Immunologic: Negative.   Neurological: Negative.  Hematological: Negative.   Psychiatric/Behavioral: Negative.         Objective:   Physical Exam Vitals and nursing note reviewed. BMI 26.63, BP 117/78, weight 194 lbs Constitutional:      Appearance: Normal appearance.  Cardiovascular:     Rate and Rhythm: Normal rate and regular rhythm.     Pulses: Normal pulses.     Heart sounds: Normal heart sounds.  Pulmonary:     Effort: Pulmonary effort is normal.     Breath sounds: Normal breath sounds.  Musculoskeletal:     Cervical back: Normal range of motion and neck supple.     Comments: Normal Muscle Bulk and Muscle Testing Reveals:  Upper Extremities: Full ROM and Muscle Strength  5/5 Lower Extremities: Full ROM and Muscle Strength 5/5 Arises from chair with ease Narrow Based  Gait  Tight cervical myofascia Forward protracted posture Skin:    General: Skin is warm and dry. No open lesions over back or shoulders. No sunburn, erythema after treatment Neurological:     Mental Status: He is alert and oriented to person, place, and time.  Psychiatric:        Mood and Affect: Mood normal.        Behavior: Behavior normal.         Assessment & Plan:   1. 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             Lidoderm  patch/ointment, Zyrtec, Tomapax, Almond milk, Elavil, Ketamine infusion (cound not tolerate), hypotherapy, compound medication, hypnosis without benefit             Unable to trial carbamazepine as patient is on Eliquis             Steroids made symptoms more severe             Side effects with Cymbalta, oxcarbazepine, Pamelor             CompletedPhysical Therapy two days a week. Continue HEP            Refilled Lyrica 100 mg TID and Lyrica 50 mg daily as needed for Break through Pain. Continue HEP as Tolerated and Continue to Monitor.   Turmeric to reduce inflammation--can be used in cooking or taken as a supplement.  Benefits of turmeric:  -Highly anti-inflammatory  -Increases antioxidants  -Improves memory, attention, brain disease  -Lowers risk of heart disease  -May help prevent cancer  -Decreases pain  -Alleviates depression  -Delays aging and decreases risk of chronic disease  -Consume with black pepper to increase absorption    Turmeric Milk Recipe:  1 cup milk  1 tsp turmeric  1 tsp cinnamon  1 tsp grated ginger (optional)  Black pepper (boosts the anti-inflammatory properties of turmeric).  1 tsp honey   Recommend topical CBD oil- discussed its benefits in reducing inflammation, pain, insomnia, and anxiety.  -Discussed that CBD oil differs from marijuana in that it does not contain THC- the substance that causes euphoria.  -Discussed that it is made from the hemp plant.  -It has been used for thousands of years -preliminary research suggests that if may be  able to shrink cancerous tumors, stop plaque formation in Alzheimer's Disease, and slow the progress of brain disease from concussions.  -Additional benefits that have been demonstrated in studies include improved nausea, indigestion, and brain health, and reduced seizures.  -In a survey 92% of patients who tried medical cannabis felt it improved symptoms such as chronic pain, arthritis, migraines, and  cancer.    -recommended certain CBD brand  -discussed procedures that could potentially help, but could also make the pain worse.   -Provided with a pain relief journal and discussed that it contains foods and lifestyle tips to naturally help to improve pain. Discussed that these lifestyle strategies are also very good for health unlike some medications which can have negative side effects. Discussed that the act of keeping a journal can be therapeutic and helpful to realize patterns what helps to trigger and alleviate pain.    -continue B complex supplement.   -Discussed Qutenza as an option for neuropathic pain control. Discussed that this is a capsaicin patch, stronger than capsaicin cream. Discussed that it is currently approved for diabetic peripheral neuropathy and post-herpetic neuralgia, but that it has also shown benefit in treating other forms of neuropathy. Provided patient with link to site to learn more about the patch: https://www.clark.biz/. Discussed that the patch would be placed in office and benefits usually last 3 months. Discussed that unintended exposure to capsaicin can cause severe irritation of eyes, mucous membranes, respiratory tract, and skin, but that Qutenza is a local treatment and does not have the systemic side effects of other nerve medications. Discussed that there may be pain, itching, erythema, and decreased sensory function associated with the application of Qutenza. Side effects usually subside within 1 week. A cold pack of analgesic medications can help with these side effects. Blood pressure can also be increased due to pain associated with administration of the patch.   2 patches of Qutenza was applied to the area of pain. Ice packs were applied during the procedure to ensure patient comfort. Blood pressure was monitored every 15 minutes. The patient tolerated the procedure well. Post-procedure instructions were given and follow-up has been scheduled.      2,  Chronic Pain Syndrome:  Continue Lyrica, d/c Baclofen 5 mg QHS. Continue to Monitor. No reports of drowsiness. Continue to monitor.  -Discussed benefits of exercise in reducing pain. -prescribed Zynex cervical traction device and heating blanket -Discussed following foods that may reduce pain: 1) Ginger (especially studied for arthritis)- reduce leukotriene production to decrease inflammation 2) Blueberries- high in phytonutrients that decrease inflammation 3) Salmon- marine omega-3s reduce joint swelling and pain 4) Pumpkin seeds- reduce inflammation 5) dark chocolate- reduces inflammation 6) turmeric- reduces inflammation 7) tart cherries - reduce pain and stiffness 8) extra virgin olive oil - its compound olecanthal helps to block prostaglandins  9) chili peppers- can be eaten or applied topically via capsaicin 10) mint- helpful for headache, muscle aches, joint pain, and itching 11) garlic- reduces inflammation  Link to further information on diet for chronic pain: http://www.bray.com/  3. Impaired balance: affected by Lyrica. Dicussed maintaining Lyrica dose at 100mg  TID 4. Hypersensitive skin: discussed that he may feel increased pain from Qutenza due to this.   5. Insomnia: Refilled Lyrica -Try to go outside near sunrise -Get exercise during the day.  -Turn off all devices an hour before bedtime.  -Teas that can benefit: chamomile, valerian root, Brahmi (Bacopa) -Can consider over the counter melatonin, magnesium, and/or L-theanine. Melatonin is an anti-oxidant with  multiple health benefits. Magnesium is involved in greater than 300 enzymatic reactions in the body and most of [place] are deficient as our soil is often depleted. There are 7 different types of magnesium- Bioptemizer's is a supplement with all 7 types, and each has unique benefits. Magnesium can also help with constipation and anxiety.  -Pistachios  naturally increase the production of melatonin -Cozy Earth bamboo bed sheets are free from toxic chemicals.  -Tart cherry juice or a tart cherry supplement can improve sleep and soreness post-workout   6. Overweight BMI 26.31, weight 194 lbs -discussed that capsaicin has been associated with weight loss -commended on weight loss!  F/u w/ me in 3 months for repeat injections

## 2022-05-16 ENCOUNTER — Other Ambulatory Visit: Payer: Self-pay | Admitting: Cardiology

## 2022-05-17 NOTE — Telephone Encounter (Signed)
Prescription refill request for Eliquis received. Indication:afib Last office visit:11/23 Scr:0.8 Age: 77 Weight:88 kg  Prescription refilled

## 2022-05-18 ENCOUNTER — Other Ambulatory Visit: Payer: Self-pay | Admitting: Cardiology

## 2022-06-09 ENCOUNTER — Encounter (HOSPITAL_COMMUNITY): Payer: Self-pay | Admitting: *Deleted

## 2022-06-27 NOTE — Progress Notes (Unsigned)
  Electrophysiology Office Follow up Visit Note:    Date:  06/27/2022   ID:  Evan Robinson, DOB Jun 22, 1945, MRN [id number]  PCP:  Henrietta Dine, MD  [facility] Cardiologist:  None  [facility] Electrophysiologist:  Vickie Epley, MD    Interval History:    Evan Robinson is a 77 y.o. male who presents for a follow up visit.   Last seen 30th 2023 by [facility].  He had a prior PVI July 30, 2021.  Flecainide was stopped in July 2023.  He had an episode of atrial fibrillation in mid November.  He restarted his flecainide.  He was symptomatic while out of rhythm.  He presents to discuss possible repeat ablation.  He did have a hospital visit in October 2023 for an arm hematoma.     Past medical, surgical, social and family history were reviewed.  ROS:   Please see the history of present illness.    All other systems reviewed and are negative.  EKGs/Labs/Other Studies Reviewed:    The following studies were reviewed today:    EKG:  The ekg ordered today demonstrates ***   Physical Exam:    VS:  There were no vitals taken for this visit.    Wt Readings from Last 3 Encounters:  05/13/22 194 lb (88 kg)  03/31/22 198 lb (89.8 kg)  02/27/22 195 lb (88.5 kg)     GEN: *** Well nourished, well developed in no acute distress CARDIAC: ***RRR, no murmurs, rubs, gallops RESPIRATORY:  Clear to auscultation without rales, wheezing or rhonchi       ASSESSMENT:    1. Paroxysmal atrial fibrillation (HCC)   2. Primary hypertension    PLAN:    In order of problems listed above:   #Paroxysmal atrial fibrillation #High risk med-flecainide He had a PVI July 30, 2021 Continue flecainide and Eliquis at the current doses.  EKG stable for continued flecainide use  Discussed treatment options today for their AF including antiarrhythmic drug therapy and ablation. Discussed risks, recovery and likelihood of success. Discussed potential need for repeat ablation procedures and  antiarrhythmic drugs after an initial ablation. They wish to proceed with scheduling.  Risk, benefits, and alternatives to EP study and radiofrequency ablation for afib were also discussed in detail today. These risks include but are not limited to stroke, bleeding, vascular damage, tamponade, perforation, damage to the esophagus, lungs, and other structures, pulmonary vein stenosis, worsening renal function, and death. The patient understands these risk and wishes to proceed.  We will therefore proceed with catheter ablation at the next available time.  Carto, ICE, anesthesia are requested for the procedure.  Will also obtain CT PV protocol prior to the procedure to exclude LAA thrombus and further evaluate atrial anatomy.   ***?  Watchman given history of hematoma     Signed, Lars Mage, MD, [facility], [facility] 06/27/2022 9:23 PM    Electrophysiology [facility]

## 2022-06-28 ENCOUNTER — Ambulatory Visit: Payer: Medicare PPO | Attending: Cardiology | Admitting: Cardiology

## 2022-06-28 ENCOUNTER — Encounter: Payer: Self-pay | Admitting: Cardiology

## 2022-06-28 VITALS — BP 128/74 | HR 65 | Ht 72.0 in | Wt 193.8 lb

## 2022-06-28 DIAGNOSIS — I10 Essential (primary) hypertension: Secondary | ICD-10-CM

## 2022-06-28 DIAGNOSIS — I480 Paroxysmal atrial fibrillation: Secondary | ICD-10-CM | POA: Diagnosis not present

## 2022-06-28 DIAGNOSIS — I4819 Other persistent atrial fibrillation: Secondary | ICD-10-CM

## 2022-06-28 HISTORY — DX: Other persistent atrial fibrillation: I48.19

## 2022-06-28 NOTE — Progress Notes (Signed)
Electrophysiology Office Follow up Visit Note:    Date:  06/28/2022   ID:  Evan Robinson, DOB 06-Sep-1945, MRN [id number]  PCP:  Henrietta Dine, MD  [facility] Cardiologist:  None  [facility] Electrophysiologist:  Vickie Epley, MD    Interval History:    Evan Robinson is a 77 y.o. male who presents for a follow up visit.   Last seen 30th 2023 by [facility].  He had a prior PVI July 30, 2021.  Flecainide was stopped in July 2023.  He had an episode of atrial fibrillation in mid November.  He restarted his flecainide.  He was symptomatic while out of rhythm.  He presents to discuss possible repeat ablation.  He did have a hospital visit in October 2023 for an arm hematoma.  Today, he states he had a flare-up of Afib in 03/2022, lasting for about a week. He self-resumed flecainide as he had some pills left, and was advised to continue this until we were able to discuss today.  Whenever he is in atrial fibrillation, he generally feels completely lethargic with a high heart rate, such as in the 150s while brushing his teeth.  Prior to his ablation 06/2021 he was experiencing episodes about 3 times a year,  lasting 3-5 days on average.  He remains compliant with Eliquis and diltiazem as well. He is tolerating his medications.  He denies any chest pain, shortness of breath, or peripheral edema. No lightheadedness, headaches, syncope, orthopnea, or PND.      Past medical, surgical, social and family history were reviewed.  ROS:   Please see the history of present illness.    All other systems reviewed and are negative.  EKGs/Labs/Other Studies Reviewed:    The following studies were reviewed today:    EKG:  The ekg ordered today demonstrates sinus rhythm.  PR interval 192 ms.  QRS duration 126 ms.   Physical Exam:    VS:  BP 128/74   Pulse 65   Ht 6' (1.829 m)   Wt 193 lb 12.8 oz (87.9 kg)   SpO2 99%   BMI 26.28 kg/m     Wt Readings from Last 3 Encounters:   06/28/22 193 lb 12.8 oz (87.9 kg)  05/13/22 194 lb (88 kg)  03/31/22 198 lb (89.8 kg)     GEN: Well nourished, well developed in no acute distress CARDIAC: RRR, no murmurs, rubs, gallops RESPIRATORY:  Clear to auscultation without rales, wheezing or rhonchi       ASSESSMENT:    1. Paroxysmal atrial fibrillation (HCC)   2. Primary hypertension    PLAN:    In order of problems listed above:   #Paroxysmal atrial fibrillation #High risk med-flecainide He had a PVI July 30, 2021 Continue flecainide and Eliquis at the current doses.  EKG stable for continued flecainide use  Discussed treatment options today for their AF including antiarrhythmic drug therapy and ablation. Discussed risks, recovery and likelihood of success. Discussed potential need for repeat ablation procedures and antiarrhythmic drugs after an initial ablation. They wish to proceed with scheduling.  Risk, benefits, and alternatives to EP study and radiofrequency ablation for afib were also discussed in detail today. These risks include but are not limited to stroke, bleeding, vascular damage, tamponade, perforation, damage to the esophagus, lungs, and other structures, pulmonary vein stenosis, worsening renal function, and death. The patient understands these risk and wishes to proceed.  We will therefore proceed with catheter ablation at the next available time.  Carto, ICE, anesthesia are requested for the procedure.  Will also obtain CT PV protocol prior to the procedure to exclude LAA thrombus and further evaluate atrial anatomy.  Hold flecainide and diltiazem 5 days prior to the ablation procedure.  Josetta Huddle Stumpf,acting as a scribe for Vickie Epley, MD.,have documented all relevant documentation on the behalf of Vickie Epley, MD,as directed by  Vickie Epley, MD while in the presence of Vickie Epley, MD.  I, Vickie Epley, MD, have reviewed all documentation for this visit. The  documentation on 06/28/22 for the exam, diagnosis, procedures, and orders are all accurate and complete.   Signed, Lars Mage, MD, [facility], [facility] 06/28/2022 9:02 AM    Electrophysiology [facility]

## 2022-06-28 NOTE — Patient Instructions (Addendum)
Medication Instructions:  Your physician recommends that you continue on your current medications as directed. Please refer to the Current Medication list given to you today.  *If you need a refill on your cardiac medications before your next appointment, please call your pharmacy*   Lab Work: BMET and CBC on June 10th between 7:30am and 5:00pm. You DO NOT need to be fasting.   Testing/Procedures: Your physician has requested that you have cardiac CT. Cardiac computed tomography (CT) is a painless test that uses an x-ray machine to take clear, detailed pictures of your heart. Please follow instruction sheet as given. We will call you to schedule CT scan. This will be done about a week prior to your ablation.   Your physician has recommended that you have an ablation. Catheter ablation is a medical procedure used to treat some cardiac arrhythmias (irregular heartbeats). During catheter ablation, a long, thin, flexible tube is put into a blood vessel in your groin (upper thigh), or neck. This tube is called an ablation catheter. It is then guided to your heart through the blood vessel. Radio frequency waves destroy small areas of heart tissue where abnormal heartbeats may cause an arrhythmia to start. Please see the instruction sheet given to you today. You are scheduled for Atrial Fibrillation Ablation on Tuesday, June 25 with Dr. Lars Mage at 10:30am. Please arrive at the Main Entrance A at [facility]: [place], [place] [address] at 8:30 AM    Follow-Up: At [facility], you and your health needs are our priority.  As part of our continuing mission to provide you with exceptional heart care, we have created designated Provider Care Teams.  These Care Teams include your primary Cardiologist (physician) and Advanced Practice Providers (APPs -  Physician Assistants and Nurse Practitioners) who all work together to provide you with the care you need, when you need  it.  Your next appointment:   We will call you to arrange your follow up appointments.

## 2022-08-15 ENCOUNTER — Encounter: Payer: Medicare PPO | Attending: Physical Medicine and Rehabilitation | Admitting: Physical Medicine and Rehabilitation

## 2022-08-15 ENCOUNTER — Encounter: Payer: Self-pay | Admitting: Physical Medicine and Rehabilitation

## 2022-08-15 VITALS — BP 139/79 | HR 57 | Ht 72.0 in | Wt 195.0 lb

## 2022-08-15 DIAGNOSIS — M792 Neuralgia and neuritis, unspecified: Secondary | ICD-10-CM

## 2022-08-15 MED ORDER — CAPSAICIN-CLEANSING GEL 8 % EX KIT
2.0000 | PACK | Freq: Once | CUTANEOUS | Status: AC
Start: 2022-08-15 — End: 2022-08-15
  Administered 2022-08-15: 2 via TOPICAL

## 2022-08-15 MED ORDER — PREGABALIN 75 MG PO CAPS: 75.0000 mg | ORAL_CAPSULE | Freq: Three times a day (TID) | ORAL | 3 refills | Status: DC

## 2022-08-15 NOTE — Progress Notes (Signed)
Subjective:    Patient ID: Evan Robinson, male    DOB: 1946-01-23, 77 y.o.   MRN: [id number]   HPI: Evan Robinson is a 77 y.o. male who presents for f/u of facial pain, shoulder pain, and capelike distribution of upper back and neck pain.   He returns for follow-up appointment for chronic pain and medication refill.  -He reports he has facial pain and neck pain radiating into his bilateral shoulders  -his pain is much better controlled since we have started applying the Qutenza patches -he has felt that these and the turmeric have been most helpful for his pain -he feels ready to wean down on his Lyrica to   His pain has improved since last visit- he has noted improvements with Qutenza -he does feel burning and tingling during the first couple of days after application but this dissapates shortly afterward -he would like to repeat Qutenza today -he would like to try ice packs with application -he does not need refill of lyrica today -he had a good trip with his wife to [place] recently -he plans to travel with her more, moving and staying busy helps his pain He current has no sunburns -he has plans to travel this summer with his wife -he maintains on his prior medications and does not need any further refills today He has burning in left sided facial pain and shoulder It is worst with friction and exacerbated with lying down.  He has been on the Lyrica for years. He is currently  BID and he is able to tolerate this dose well. He had dizziness with higher doses He has not had cognitive side effects He did well with the memory recall.  He has never tried CBD oil.  Pain worsens with shaving He tried aloe vera without great benefit He is excited to try Qutenza today- it gave him about 1 month of relief last visit.  He is not sure if he has tried carbamazepine Capsaicin made his skin burn and raw in the past.  -He is interested in trying medical marijuana if it gets approved.   -He asks for a refill of Lyrica -He is ready for Qutenza today -Had a good Christmas and New Year's Family  -he does have some dizziness -had too many side effects from gabapentin    Pain Inventory Average Pain 8 Pain Right Now 7 My pain is burning  In the last 24 hours, has pain interfered with the following? General activity 7 Relation with others 8 Enjoyment of life 7 What TIME of day is your pain at its worst? evening and night Sleep (in general) Fair  Pain is worse with: sitting Pain improves with: medication Relief from Meds: 4  Family History  Problem Relation Age of Onset   Alcohol abuse Father    Cancer Father        prostate   CAD Father        possible MI   Alcohol abuse Paternal Grandfather    Stroke Mother    Diabetes Neg Hx    Social History   Socioeconomic History   Marital status: Married    Spouse name: Not on file   Number of children: Not on file   Years of education: Not on file   Highest education level: Not on file  Occupational History   Not on file  Tobacco Use   Smoking status: Never   Smokeless tobacco: Never  Vaping Use   Vaping Use: Never used  Substance and Sexual Activity   Alcohol use: No   Drug use: No   Sexual activity: Not on file  Other Topics Concern   Not on file  Social History Narrative   "Gene"   Lives with wife   Occupation: retired, was Dietitian   Activity: walks daily, some gym   Diet:    Social Determinants of Health   Financial Resource Strain: Not on file  Food Insecurity: Not on file  Transportation Needs: Not on file  Physical Activity: Not on file  Stress: Not on file  Social Connections: Not on file   Past Surgical History:  Procedure Laterality Date   ATRIAL FIBRILLATION ABLATION N/A 07/30/2021   Procedure: ATRIAL FIBRILLATION ABLATION;  Surgeon: Lanier Prude, MD;  [facility]: [facility];  Service: Cardiovascular;  Laterality: N/A;   CHOLECYSTECTOMY  2006   COLONOSCOPY WITH  PROPOFOL N/A 02/01/2017   Procedure: COLONOSCOPY WITH PROPOFOL;  Surgeon: Toledo, Boykin Nearing, MD;  Location: [facility] ENDOSCOPY;  Service: Gastroenterology;  Laterality: N/A;   ESOPHAGOGASTRODUODENOSCOPY (EGD) WITH PROPOFOL N/A 02/01/2017   Procedure: ESOPHAGOGASTRODUODENOSCOPY (EGD) WITH PROPOFOL;  Surgeon: Toledo, Boykin Nearing, MD;  Location: [facility] ENDOSCOPY;  Service: Gastroenterology;  Laterality: N/A;   HERNIA REPAIR  08/2010   PROSTATECTOMY  2003   Past Surgical History:  Procedure Laterality Date   ATRIAL FIBRILLATION ABLATION N/A 07/30/2021   Procedure: ATRIAL FIBRILLATION ABLATION;  Surgeon: Lanier Prude, MD;  Location: [facility]VAS[facility];  Service: Cardiovascular;  Laterality: N/A;   CHOLECYSTECTOMY  2006   COLONOSCOPY WITH PROPOFOL N/A 02/01/2017   Procedure: COLONOSCOPY WITH PROPOFOL;  Surgeon: Toledo, Boykin Nearing, MD;  Location: [facility] ENDOSCOPY;  Service: Gastroenterology;  Laterality: N/A;   ESOPHAGOGASTRODUODENOSCOPY (EGD) WITH PROPOFOL N/A 02/01/2017   Procedure: ESOPHAGOGASTRODUODENOSCOPY (EGD) WITH PROPOFOL;  Surgeon: Toledo, Boykin Nearing, MD;  Location: [facility] ENDOSCOPY;  Service: Gastroenterology;  Laterality: N/A;   HERNIA REPAIR  08/2010   PROSTATECTOMY  2003   Past Medical History:  Diagnosis Date   Dyslipidemia    H/O prostate cancer 05/02/2001   s/p radical prostatectomy (uro Dr. Rayburn Ma in [place] yearly)   Headache 05/02/2010   thorough w/u - unrevealing.  has seen 3 neurologists, MRI, LP nonacute.  ESR = 3   HTN (hypertension)    Hypogonadism male    Low testosterone    Migraines    OSA on CPAP    Persistent atrial fibrillation 06/28/2022   Renal cyst 12/01/2010   complex cyst upper pole L kidney with no malignancy by MRI   Vertigo    Vitamin D deficiency    BP 139/79   Pulse (!) 57   Ht 6' (1.829 m)   Wt 195 lb (88.5 kg)   SpO2 98%   BMI 26.45 kg/m   Opioid Risk Score:   Fall Risk Score:  `1  Depression screen [facility] 2/9     08/15/2022    9:52 AM 02/07/2022    10:16 AM 11/15/2021    1:51 PM 08/26/2021    9:30 AM 06/01/2021    9:08 AM 02/12/2021   11:14 AM 11/27/2020    9:57 AM  Depression screen PHQ 2/9  Decreased Interest 0 0 0 0 0 0 0  Down, Depressed, Hopeless 0 0 0 0 0 0 0  PHQ - 2 Score 0 0 0 0 0 0 0      Review of Systems  Constitutional: Negative.   HENT: Negative.    Eyes: Negative.  Respiratory: Negative.    Cardiovascular: Negative.   Gastrointestinal: Negative.   Endocrine: Negative.   Musculoskeletal:  Positive for neck pain.       Nerve pain , pain in shoulders ,   Skin: Negative.   Allergic/Immunologic: Negative.   Neurological: Negative.   Hematological: Negative.   Psychiatric/Behavioral: Negative.         Objective:   Physical Exam Vitals and nursing note reviewed. BMI 26.63, BP 117/78, weight 194 lbs Constitutional:      Appearance: Normal appearance.  Cardiovascular:     Rate and Rhythm: Normal rate and regular rhythm.     Pulses: Normal pulses.     Heart sounds: Normal heart sounds.  Pulmonary:     Effort: Pulmonary effort is normal.     Breath sounds: Normal breath sounds.  Musculoskeletal:     Cervical back: Normal range of motion and neck supple.     Comments: Normal Muscle Bulk and Muscle Testing Reveals:  Upper Extremities: Full ROM and Muscle Strength  5/5 Lower Extremities: Full ROM and Muscle Strength 5/5 Arises from chair with ease Narrow Based  Gait  Tight cervical myofascia Forward protracted posture Skin:    General: Skin is warm and dry. No open lesions over back or shoulders. No sunburn, erythema after treatment Neurological:     Mental Status: He is alert and oriented to person, place, and time.  Psychiatric:        Mood and Affect: Mood normal.        Behavior: Behavior normal.         Assessment & Plan:   1. 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             Lidoderm patch/ointment, Zyrtec, Tomapax, Almond milk, Elavil, Ketamine infusion (cound not tolerate), hypotherapy, compound medication, hypnosis without benefit             Unable to trial carbamazepine as patient is on Eliquis             Steroids made symptoms more severe             Side effects with Cymbalta, oxcarbazepine, Pamelor             CompletedPhysical Therapy two days a week. Continue HEP            Refilled Lyrica 100 mg TID and Lyrica 50 mg daily as needed for Break through Pain. Continue HEP as Tolerated and Continue to Monitor.   Turmeric to reduce inflammation--can be used in cooking or taken as a supplement.  Benefits of turmeric:  -Highly anti-inflammatory  -Increases antioxidants  -Improves memory, attention, brain disease  -Lowers risk of heart disease  -May help prevent cancer  -Decreases pain  -Alleviates depression  -Delays aging and decreases risk of chronic disease  -Consume with black pepper to increase absorption    Turmeric Milk Recipe:  1 cup milk  1 tsp turmeric  1 tsp cinnamon  1 tsp grated ginger (optional)  Black pepper (boosts the anti-inflammatory properties of turmeric).  1 tsp honey   Recommend topical CBD oil- discussed its benefits in reducing inflammation,  pain, insomnia, and anxiety.  -Discussed that CBD oil differs from marijuana in that it does not contain THC- the substance that causes euphoria.  -Discussed that it is made from the hemp plant.  -It has been used for thousands of years Prescribed Zynex Nexwave, cervical traction device, and heating/cooling blanket  -preliminary research suggests that if may be able to shrink cancerous tumors, stop plaque formation in Alzheimer's Disease, and slow the progress of brain disease from  concussions.  -Additional benefits that have been demonstrated in studies include improved nausea, indigestion, and brain health, and reduced seizures.  -In a survey 92% of patients who tried medical cannabis felt it improved symptoms such as chronic pain, arthritis, migraines, and cancer.    -recommended certain CBD brand  -discussed procedures that could potentially help, but could also make the pain worse.   -Provided with a pain relief journal and discussed that it contains foods and lifestyle tips to naturally help to improve pain. Discussed that these lifestyle strategies are also very good for health unlike some medications which can have negative side effects. Discussed that the act of keeping a journal can be therapeutic and helpful to realize patterns what helps to trigger and alleviate pain.    -continue B complex supplement.   -Discussed Qutenza as an option for neuropathic pain control. Discussed that this is a capsaicin patch, stronger than capsaicin cream. Discussed that it is currently approved for diabetic peripheral neuropathy and post-herpetic neuralgia, but that it has also shown benefit in treating other forms of neuropathy. Provided patient with link to site to learn more about the patch: https://www.clark.biz/. Discussed that the patch would be placed in office and benefits usually last 3 months. Discussed that unintended exposure to capsaicin can cause severe irritation of eyes, mucous membranes, respiratory tract, and skin, but that Qutenza is a local treatment and does not have the systemic side effects of other nerve medications. Discussed that there may be pain, itching, erythema, and decreased sensory function associated with the application of Qutenza. Side effects usually subside within 1 week. A cold pack of analgesic medications can help with these side effects. Blood pressure can also be increased due to pain associated with administration of the patch.   2 patches of  Qutenza was applied to the area of pain. Ice packs were applied during the procedure to ensure patient comfort. Blood pressure was monitored every 15 minutes. The patient tolerated the procedure well. Post-procedure instructions were given and follow-up has been scheduled.       2, Chronic Pain Syndrome:  Continue Lyrica, d/c Baclofen 5 mg QHS. Continue to Monitor. No reports of drowsiness. Continue to monitor.  -Discussed benefits of exercise in reducing pain. -prescribed Zynex cervical traction device and heating blanket -Discussed following foods that may reduce pain: 1) Ginger (especially studied for arthritis)- reduce leukotriene production to decrease inflammation 2) Blueberries- high in phytonutrients that decrease inflammation 3) Salmon- marine omega-3s reduce joint swelling and pain 4) Pumpkin seeds- reduce inflammation 5) dark chocolate- reduces inflammation 6) turmeric- reduces inflammation 7) tart cherries - reduce pain and stiffness 8) extra virgin olive oil - its compound olecanthal helps to block prostaglandins  9) chili peppers- can be eaten or applied topically via capsaicin 10) mint- helpful for headache, muscle aches, joint pain, and itching 11) garlic- reduces inflammation  Link to further information on diet for chronic pain: http://www.bray.com/  3. Impaired balance: affected by Lyrica. Dicussed maintaining Lyrica dose at 100mg  TID 4. Hypersensitive skin: discussed  that he may feel increased pain from Qutenza due to this.   5. Insomnia: Refilled Lyrica -Try to go outside near sunrise -Get exercise during the day.  -Turn off all devices an hour before bedtime.  -Teas that can benefit: chamomile, valerian root, Brahmi (Bacopa) -Can consider over the counter melatonin, magnesium, and/or L-theanine. Melatonin is an anti-oxidant with multiple health benefits. Magnesium is involved in greater than 300  enzymatic reactions in the body and most of [place] are deficient as our soil is often depleted. There are 7 different types of magnesium- Bioptemizer's is a supplement with all 7 types, and each has unique benefits. Magnesium can also help with constipation and anxiety.  -Pistachios naturally increase the production of melatonin -Cozy Earth bamboo bed sheets are free from toxic chemicals.  -Tart cherry juice or a tart cherry supplement can improve sleep and soreness post-workout   6. Overweight BMI 26.31, weight 194 lbs -discussed that capsaicin has been associated with weight loss -commended on weight loss!  F/u w/ me in 3 months for repeat injections   >40 minutes spent in discussion of risks and benefits of Qutenza and obtaining informed consent, discussion of q90 day follow-up and expectation of improvement in pain with each repeat application, discussed excellent improvement and near resolution of pain with Qutenza treatments and daily turmeric supplement, discussed tens unit and cervical traction device and trialed these with Qutenza rep

## 2022-08-29 ENCOUNTER — Other Ambulatory Visit: Payer: Self-pay | Admitting: Otolaryngology

## 2022-09-07 NOTE — Progress Notes (Signed)
Chart reviewed with Dr. Arby Barrette, patient is pending ablation due to history of symptomatic afib. Spke with Albin Felling, at Dr. Erskine Speed office, patient will need to have ablation first.

## 2022-09-14 ENCOUNTER — Encounter (HOSPITAL_BASED_OUTPATIENT_CLINIC_OR_DEPARTMENT_OTHER): Payer: Self-pay

## 2022-09-14 ENCOUNTER — Ambulatory Visit (HOSPITAL_BASED_OUTPATIENT_CLINIC_OR_DEPARTMENT_OTHER): Admit: 2022-09-14 | Payer: Medicare PPO | Admitting: Otolaryngology

## 2022-09-14 SURGERY — EXCISION, NEOPLASM, MOUTH
Anesthesia: General | Laterality: Right

## 2022-09-16 ENCOUNTER — Encounter: Payer: Self-pay | Admitting: *Deleted

## 2022-10-10 ENCOUNTER — Ambulatory Visit: Payer: Medicare PPO | Attending: Cardiology

## 2022-10-10 DIAGNOSIS — I480 Paroxysmal atrial fibrillation: Secondary | ICD-10-CM

## 2022-10-10 DIAGNOSIS — I10 Essential (primary) hypertension: Secondary | ICD-10-CM

## 2022-10-11 LAB — CBC WITH DIFFERENTIAL/PLATELET
Basophils Absolute: 0.1 10*3/uL (ref 0.0–0.2)
Basos: 1 %
EOS (ABSOLUTE): 0.2 10*3/uL (ref 0.0–0.4)
Eos: 4 %
Hematocrit: 43.7 % (ref 37.5–51.0)
Hemoglobin: 14.8 g/dL (ref 13.0–17.7)
Immature Grans (Abs): 0 10*3/uL (ref 0.0–0.1)
Immature Granulocytes: 0 %
Lymphocytes Absolute: 1.3 10*3/uL (ref 0.7–3.1)
Lymphs: 26 %
MCH: 30.4 pg (ref 26.6–33.0)
MCHC: 33.9 g/dL (ref 31.5–35.7)
MCV: 90 fL (ref 79–97)
Monocytes Absolute: 0.5 10*3/uL (ref 0.1–0.9)
Monocytes: 10 %
Neutrophils Absolute: 3.1 10*3/uL (ref 1.4–7.0)
Neutrophils: 59 %
Platelets: 171 10*3/uL (ref 150–450)
RBC: 4.87 x10E6/uL (ref 4.14–5.80)
RDW: 12.7 % (ref 11.6–15.4)
WBC: 5.2 10*3/uL (ref 3.4–10.8)

## 2022-10-11 LAB — BASIC METABOLIC PANEL
BUN/Creatinine Ratio: 11 (ref 10–24)
BUN: 11 mg/dL (ref 8–27)
CO2: 26 mmol/L (ref 20–29)
Calcium: 9.3 mg/dL (ref 8.6–10.2)
Chloride: 91 mmol/L — ABNORMAL LOW (ref 96–106)
Creatinine, Ser: 0.98 mg/dL (ref 0.76–1.27)
Glucose: 102 mg/dL — ABNORMAL HIGH (ref 70–99)
Potassium: 3.9 mmol/L (ref 3.5–5.2)
Sodium: 128 mmol/L — ABNORMAL LOW (ref 134–144)
eGFR: 79 mL/min/{1.73_m2} (ref >59)

## 2022-10-14 ENCOUNTER — Telehealth: Payer: Self-pay | Admitting: Physical Medicine and Rehabilitation

## 2022-10-14 NOTE — Telephone Encounter (Signed)
They wanted to know if he takes the 100 mg or the 75 mg or if he takes both

## 2022-10-14 NOTE — Telephone Encounter (Signed)
Pharmacy notified.

## 2022-10-17 ENCOUNTER — Telehealth (HOSPITAL_COMMUNITY): Payer: Self-pay | Admitting: Emergency Medicine

## 2022-10-17 ENCOUNTER — Encounter (HOSPITAL_COMMUNITY): Payer: Self-pay

## 2022-10-17 NOTE — Telephone Encounter (Signed)
Attempted to call patient regarding upcoming cardiac CT appointment. °Left message on voicemail with name and callback number °Adra Shepler RN Navigator Cardiac Imaging °[facility] °[phone] Office °[phone] Cell ° °

## 2022-10-17 NOTE — Telephone Encounter (Signed)
Reaching out to patient to offer assistance regarding upcoming cardiac imaging study; pt verbalizes understanding of appt date/time, parking situation and where to check in, pre-test NPO status and medications ordered, and verified current allergies; name and call back number provided for further questions should they arise Ajanay Farve RN Navigator Cardiac Imaging [facility] [phone] office [phone] cell 

## 2022-10-18 ENCOUNTER — Ambulatory Visit (HOSPITAL_COMMUNITY)
Admission: RE | Admit: 2022-10-18 | Discharge: 2022-10-18 | Disposition: A | Discharge status: Home / Self Care | Payer: Medicare PPO | Source: Ambulatory Visit | Attending: Cardiology | Admitting: Cardiology

## 2022-10-18 DIAGNOSIS — I480 Paroxysmal atrial fibrillation: Secondary | ICD-10-CM

## 2022-10-18 DIAGNOSIS — I10 Essential (primary) hypertension: Secondary | ICD-10-CM | POA: Insufficient documentation

## 2022-10-18 MED ORDER — IOHEXOL 350 MG/ML SOLN
95.0000 mL | Freq: Once | INTRAVENOUS | Status: AC | PRN
  Administered 2022-10-18: 95 mL via INTRAVENOUS

## 2022-10-24 NOTE — Pre-Procedure Instructions (Signed)
Instructed patient on the following items: Arrival time 0830 Nothing to eat or drink after midnight No meds AM of procedure Responsible person to drive you home and stay with you for 24 hrs  Have you missed any doses of anti-coagulant Eliquis- takes twice a day, hasn't missed any doses.  Don't take dose in the morning.   

## 2022-10-25 ENCOUNTER — Other Ambulatory Visit: Payer: Self-pay

## 2022-10-25 ENCOUNTER — Encounter (HOSPITAL_COMMUNITY)
Admission: RE | Disposition: A | Discharge status: Home / Self Care | Payer: Self-pay | Source: Home / Self Care | Attending: Cardiology

## 2022-10-25 ENCOUNTER — Ambulatory Visit (HOSPITAL_COMMUNITY)
Admission: RE | Admit: 2022-10-25 | Discharge: 2022-10-25 | Disposition: A | Discharge status: Home / Self Care | Payer: Medicare PPO | Attending: Cardiology | Admitting: Cardiology

## 2022-10-25 ENCOUNTER — Ambulatory Visit (HOSPITAL_COMMUNITY): Payer: Medicare PPO | Admitting: Certified Registered"

## 2022-10-25 ENCOUNTER — Ambulatory Visit (HOSPITAL_BASED_OUTPATIENT_CLINIC_OR_DEPARTMENT_OTHER): Payer: Medicare PPO | Admitting: Certified Registered"

## 2022-10-25 DIAGNOSIS — I483 Typical atrial flutter: Secondary | ICD-10-CM | POA: Diagnosis not present

## 2022-10-25 DIAGNOSIS — I10 Essential (primary) hypertension: Secondary | ICD-10-CM

## 2022-10-25 DIAGNOSIS — Z79899 Other long term (current) drug therapy: Secondary | ICD-10-CM | POA: Insufficient documentation

## 2022-10-25 DIAGNOSIS — I484 Atypical atrial flutter: Secondary | ICD-10-CM | POA: Insufficient documentation

## 2022-10-25 DIAGNOSIS — I4891 Unspecified atrial fibrillation: Secondary | ICD-10-CM

## 2022-10-25 DIAGNOSIS — G4730 Sleep apnea, unspecified: Secondary | ICD-10-CM

## 2022-10-25 DIAGNOSIS — I480 Paroxysmal atrial fibrillation: Secondary | ICD-10-CM | POA: Diagnosis not present

## 2022-10-25 HISTORY — PX: ATRIAL FIBRILLATION ABLATION: EP1191

## 2022-10-25 SURGERY — ATRIAL FIBRILLATION ABLATION
Anesthesia: General

## 2022-10-25 MED ORDER — SODIUM CHLORIDE 0.9% FLUSH: 3.0000 mL | Freq: Two times a day (BID) | INTRAVENOUS | Status: DC

## 2022-10-25 MED ORDER — SUGAMMADEX SODIUM 200 MG/2ML IV SOLN
INTRAVENOUS | Status: DC | PRN
  Administered 2022-10-25: 150 mg via INTRAVENOUS

## 2022-10-25 MED ORDER — FENTANYL CITRATE (PF) 100 MCG/2ML IJ SOLN
INTRAMUSCULAR | Status: DC | PRN
  Administered 2022-10-25: 100 ug via INTRAVENOUS

## 2022-10-25 MED ORDER — ONDANSETRON HCL 4 MG/2ML IJ SOLN
INTRAMUSCULAR | Status: DC | PRN
  Administered 2022-10-25: 4 mg via INTRAVENOUS

## 2022-10-25 MED ORDER — ROCURONIUM BROMIDE 10 MG/ML (PF) SYRINGE
PREFILLED_SYRINGE | INTRAVENOUS | Status: DC | PRN
  Administered 2022-10-25: 50 mg via INTRAVENOUS

## 2022-10-25 MED ORDER — HEPARIN SODIUM (PORCINE) 1000 UNIT/ML IJ SOLN
INTRAMUSCULAR | Status: DC | PRN
  Administered 2022-10-25: 1000 [IU] via INTRAVENOUS

## 2022-10-25 MED ORDER — SODIUM CHLORIDE 0.9 % IV SOLN: INTRAVENOUS | Status: DC

## 2022-10-25 MED ORDER — APIXABAN 5 MG PO TABS
5.0000 mg | ORAL_TABLET | Freq: Two times a day (BID) | ORAL | Status: DC
  Administered 2022-10-25: 5 mg via ORAL
  Filled 2022-10-25 (×2): qty 1

## 2022-10-25 MED ORDER — SODIUM CHLORIDE 0.9 % IV SOLN: 250.0000 mL | INTRAVENOUS | Status: DC | PRN

## 2022-10-25 MED ORDER — COLCHICINE 0.6 MG PO TABS: 0.6000 mg | ORAL_TABLET | Freq: Two times a day (BID) | ORAL | 0 refills | Status: DC

## 2022-10-25 MED ORDER — HEPARIN (PORCINE) IN NACL 1000-0.9 UT/500ML-% IV SOLN
INTRAVENOUS | Status: DC | PRN
  Administered 2022-10-25 (×3): 500 mL

## 2022-10-25 MED ORDER — HEPARIN SODIUM (PORCINE) 1000 UNIT/ML IJ SOLN
INTRAMUSCULAR | Status: DC | PRN
  Administered 2022-10-25: 13000 [IU] via INTRAVENOUS

## 2022-10-25 MED ORDER — ONDANSETRON HCL 4 MG/2ML IJ SOLN: 4.0000 mg | Freq: Four times a day (QID) | INTRAMUSCULAR | Status: DC | PRN

## 2022-10-25 MED ORDER — HEPARIN SODIUM (PORCINE) 1000 UNIT/ML IJ SOLN
INTRAMUSCULAR | Status: AC
  Filled 2022-10-25: qty 10

## 2022-10-25 MED ORDER — ACETAMINOPHEN 325 MG PO TABS: 650.0000 mg | ORAL_TABLET | ORAL | Status: DC | PRN

## 2022-10-25 MED ORDER — COLCHICINE 0.6 MG PO TABS
0.6000 mg | ORAL_TABLET | Freq: Two times a day (BID) | ORAL | Status: DC
  Administered 2022-10-25: 0.6 mg via ORAL
  Filled 2022-10-25 (×2): qty 1

## 2022-10-25 MED ORDER — PHENYLEPHRINE HCL-NACL 20-0.9 MG/250ML-% IV SOLN
INTRAVENOUS | Status: DC | PRN
  Administered 2022-10-25: 40 ug/min via INTRAVENOUS

## 2022-10-25 MED ORDER — DEXAMETHASONE SODIUM PHOSPHATE 10 MG/ML IJ SOLN
INTRAMUSCULAR | Status: DC | PRN
  Administered 2022-10-25: 5 mg via INTRAVENOUS

## 2022-10-25 MED ORDER — LIDOCAINE 2% (20 MG/ML) 5 ML SYRINGE
INTRAMUSCULAR | Status: DC | PRN
  Administered 2022-10-25: 60 mg via INTRAVENOUS

## 2022-10-25 MED ORDER — PROPOFOL 10 MG/ML IV BOLUS
INTRAVENOUS | Status: DC | PRN
  Administered 2022-10-25: 150 ug via INTRAVENOUS

## 2022-10-25 MED ORDER — PHENYLEPHRINE 80 MCG/ML (10ML) SYRINGE FOR IV PUSH (FOR BLOOD PRESSURE SUPPORT)
PREFILLED_SYRINGE | INTRAVENOUS | Status: DC | PRN
  Administered 2022-10-25 (×2): 80 ug via INTRAVENOUS

## 2022-10-25 MED ORDER — PANTOPRAZOLE SODIUM 40 MG PO TBEC
40.0000 mg | DELAYED_RELEASE_TABLET | Freq: Every day | ORAL | Status: DC
  Administered 2022-10-25: 40 mg via ORAL
  Filled 2022-10-25 (×2): qty 1

## 2022-10-25 MED ORDER — PANTOPRAZOLE SODIUM 40 MG PO TBEC: 40.0000 mg | DELAYED_RELEASE_TABLET | Freq: Every day | ORAL | 0 refills | Status: DC

## 2022-10-25 MED ORDER — PROTAMINE SULFATE 10 MG/ML IV SOLN
INTRAVENOUS | Status: DC | PRN
  Administered 2022-10-25: 35 mg via INTRAVENOUS

## 2022-10-25 MED ORDER — SODIUM CHLORIDE 0.9% FLUSH: 3.0000 mL | INTRAVENOUS | Status: DC | PRN

## 2022-10-25 SURGICAL SUPPLY — 18 items
BAG SNAP BAND KOVER 36X36 (MISCELLANEOUS) IMPLANT
CATH ABLAT QDOT MICRO BI TC DF (CATHETERS) IMPLANT
CATH OCTARAY 2.0 F 3-3-3-3-3 (CATHETERS) IMPLANT
CATH S-M CIRCA TEMP PROBE (CATHETERS) IMPLANT
CATH SOUNDSTAR ECO 8FR (CATHETERS) IMPLANT
CATH WEBSTER BI DIR CS D-F CRV (CATHETERS) IMPLANT
CLOSURE PERCLOSE PROSTYLE (VASCULAR PRODUCTS) IMPLANT
COVER SWIFTLINK CONNECTOR (BAG) ×1 IMPLANT
PACK EP LATEX FREE (CUSTOM PROCEDURE TRAY) ×1
PACK EP LF (CUSTOM PROCEDURE TRAY) ×1 IMPLANT
PAD DEFIB RADIO PHYSIO CONN (PAD) ×1 IMPLANT
PATCH CARTO3 (PAD) IMPLANT
SHEATH BAYLIS TRANSSEPTAL 98CM (NEEDLE) IMPLANT
SHEATH CARTO VIZIGO SM CVD (SHEATH) IMPLANT
SHEATH PINNACLE 8F 10CM (SHEATH) IMPLANT
SHEATH PINNACLE 9F 10CM (SHEATH) IMPLANT
SHEATH PROBE COVER 6X72 (BAG) IMPLANT
TUBING SMART ABLATE COOLFLOW (TUBING) IMPLANT

## 2022-10-25 NOTE — Transfer of Care (Signed)
Immediate Anesthesia Transfer of Care Note  Patient: Evan Robinson  Procedure(s) Performed: ATRIAL FIBRILLATION ABLATION  Patient Location: Cath Lab  Anesthesia Type:General  Level of Consciousness: awake, alert , oriented, and patient cooperative  Airway & Oxygen Therapy: Patient Spontanous Breathing and Patient connected to nasal cannula oxygen  Post-op Assessment: Report given to RN  Post vital signs: Reviewed and stable  Last Vitals:  Vitals Value Taken Time  BP 128/75   Temp    Pulse 60   Resp 14   SpO2 99     Last Pain:  Vitals:   10/25/22 0915  TempSrc: Temporal  PainSc:          Complications: No notable events documented.

## 2022-10-25 NOTE — Anesthesia Preprocedure Evaluation (Signed)
Anesthesia Evaluation  Patient identified by MRN, date of birth, ID band Patient awake    Reviewed: Allergy & Precautions, H&P , NPO status , Patient's Chart, lab work & pertinent test results  Airway Mallampati: II   Neck ROM: full    Dental   Pulmonary sleep apnea    breath sounds clear to auscultation       Cardiovascular hypertension, + dysrhythmias Atrial Fibrillation  Rhythm:irregular Rate:Normal     Neuro/Psych  Headaches  Neuromuscular disease    GI/Hepatic   Endo/Other    Renal/GU      Musculoskeletal   Abdominal   Peds  Hematology   Anesthesia Other Findings   Reproductive/Obstetrics                             Anesthesia Physical Anesthesia Plan  ASA: 3  Anesthesia Plan: General   Post-op Pain Management:    Induction: Intravenous  PONV Risk Score and Plan: 2 and Ondansetron, Dexamethasone, Midazolam and Treatment may vary due to age or medical condition  Airway Management Planned: Oral ETT  Additional Equipment:   Intra-op Plan:   Post-operative Plan: Extubation in OR  Informed Consent: I have reviewed the patients History and Physical, chart, labs and discussed the procedure including the risks, benefits and alternatives for the proposed anesthesia with the patient or authorized representative who has indicated his/her understanding and acceptance.     Dental advisory given  Plan Discussed with: CRNA, Anesthesiologist and Surgeon  Anesthesia Plan Comments:        Anesthesia Quick Evaluation

## 2022-10-25 NOTE — Anesthesia Procedure Notes (Signed)
Procedure Name: Intubation Date/Time: 10/25/2022 10:39 AM  Performed by: De Nurse, CRNAPre-anesthesia Checklist: Patient identified, Emergency Drugs available, Suction available and Patient being monitored Patient Re-evaluated:Patient Re-evaluated prior to induction Oxygen Delivery Method: Circle System Utilized Preoxygenation: Pre-oxygenation with 100% oxygen Induction Type: IV induction Ventilation: Two handed mask ventilation required and Mask ventilation without difficulty Laryngoscope Size: Mac and 4 Grade View: Grade II Tube type: Oral Tube size: 7.5 mm Number of attempts: 1 Airway Equipment and Method: Stylet and Oral airway Placement Confirmation: ETT inserted through vocal cords under direct vision, positive ETCO2 and breath sounds checked- equal and bilateral Secured at: 23 cm Tube secured with: Tape Dental Injury: Teeth and Oropharynx as per pre-operative assessment  Comments: Performed by Justice Britain

## 2022-10-25 NOTE — Discharge Instructions (Signed)

## 2022-10-25 NOTE — H&P (Signed)
Electrophysiology Office Follow up Visit Note:     Date:  10/25/2022    ID:  Evan Robinson, DOB [birth year], MRN [id number]   PCP:  Genice Rouge, MD   [facility] Cardiologist:  None  [facility] Electrophysiologist:  Lanier Prude, MD      Interval History:     Evan Robinson is a 77 y.o. male who presents for a follow up visit.    Last seen 30th 2023 by [facility].  He had a prior PVI July 30, 2021.  Flecainide was stopped in July 2023.  He had an episode of atrial fibrillation in mid November.  He restarted his flecainide.  He was symptomatic while out of rhythm.  He presents to discuss possible repeat ablation.   He did have a hospital visit in October 2023 for an arm hematoma.   Today, he states he had a flare-up of Afib in 03/2022, lasting for about a week. He self-resumed flecainide as he had some pills left, and was advised to continue this until we were able to discuss today.   Whenever he is in atrial fibrillation, he generally feels completely lethargic with a high heart rate, such as in the 150s while brushing his teeth.   Prior to his ablation 06/2021 he was experiencing episodes about 3 times a year,  lasting 3-5 days on average.   He remains compliant with Eliquis and diltiazem as well. He is tolerating his medications.   He denies any chest pain, shortness of breath, or peripheral edema. No lightheadedness, headaches, syncope, orthopnea, or PND.   Today, he presents for redo PVI.     Objective  Past medical, surgical, social and family history were reviewed.   ROS:   Please see the history of present illness.    All other systems reviewed and are negative.   EKGs/Labs/Other Studies Reviewed:     The following studies were reviewed today:       EKG:  The ekg ordered today demonstrates sinus rhythm.  PR interval 192 ms.  QRS duration 126 ms.     Physical Exam:     VS:  BP 163/87   Pulse 63   Ht 6' (1.829 m)   Wt 193 lb 12.8 oz (87.9 kg)   SpO2  99%   BMI 26.28 kg/m         Wt Readings from Last 3 Encounters:  06/28/22 193 lb 12.8 oz (87.9 kg)  05/13/22 194 lb (88 kg)  03/31/22 198 lb (89.8 kg)      GEN: Well nourished, well developed in no acute distress CARDIAC: RRR, no murmurs, rubs, gallops RESPIRATORY:  Clear to auscultation without rales, wheezing or rhonchi          Assessment ASSESSMENT:     1. Paroxysmal atrial fibrillation (HCC)   2. Primary hypertension     PLAN:     In order of problems listed above:     #Paroxysmal atrial fibrillation #High risk med-flecainide He had a PVI July 30, 2021 Continue flecainide and Eliquis at the current doses.  EKG stable for continued flecainide use   Discussed treatment options today for their AF including antiarrhythmic drug therapy and ablation. Discussed risks, recovery and likelihood of success. Discussed potential need for repeat ablation procedures and antiarrhythmic drugs after an initial ablation. They wish to proceed with scheduling.   Risk, benefits, and alternatives to EP study and radiofrequency ablation for afib were also discussed in detail today. These risks include but  are not limited to stroke, bleeding, vascular damage, tamponade, perforation, damage to the esophagus, lungs, and other structures, pulmonary vein stenosis, worsening renal function, and death. The patient understands these risk and wishes to proceed.  We will therefore proceed with catheter ablation at the next available time.  Carto, ICE, anesthesia are requested for the procedure.  Will also obtain CT PV protocol prior to the procedure to exclude LAA thrombus and further evaluate atrial anatomy.   Hold flecainide and diltiazem 5 days prior to the ablation procedure.    Presents for redo PVI today.    Signed, Steffanie Dunn, MD, [facility], [facility] 10/25/2022 Electrophysiology [facility]

## 2022-10-26 ENCOUNTER — Encounter (HOSPITAL_COMMUNITY): Payer: Self-pay | Admitting: Cardiology

## 2022-10-26 LAB — POCT ACTIVATED CLOTTING TIME
Activated Clotting Time: 421 seconds
Activated Clotting Time: 354 seconds

## 2022-10-26 NOTE — Anesthesia Postprocedure Evaluation (Signed)
Anesthesia Post Note  Patient: Evan Robinson  Procedure(s) Performed: ATRIAL FIBRILLATION ABLATION     Patient location during evaluation: PACU Anesthesia Type: General Level of consciousness: awake and alert Pain management: pain level controlled Vital Signs Assessment: post-procedure vital signs reviewed and stable Respiratory status: spontaneous breathing, nonlabored ventilation, respiratory function stable and patient connected to nasal cannula oxygen Cardiovascular status: blood pressure returned to baseline and stable Postop Assessment: no apparent nausea or vomiting Anesthetic complications: no   No notable events documented.  Last Vitals:  Vitals:   10/25/22 1500 10/25/22 1600  BP: 115/76 93/62  Pulse: 72 (!) 56  Resp: 16 13  Temp:    SpO2: 97% 98%    Last Pain:  Vitals:   10/25/22 1645  TempSrc:   PainSc: 0-No pain                 Towanda Hornstein S

## 2022-11-05 ENCOUNTER — Other Ambulatory Visit: Payer: Self-pay | Admitting: Cardiology

## 2022-11-05 DIAGNOSIS — I4819 Other persistent atrial fibrillation: Secondary | ICD-10-CM

## 2022-11-07 ENCOUNTER — Other Ambulatory Visit: Payer: Self-pay | Admitting: Cardiology

## 2022-11-07 NOTE — Telephone Encounter (Signed)
Pt pharmacy requesting refill on medication not prescribed by Dr Lalla Brothers. Please address. Thank you

## 2022-11-07 NOTE — Telephone Encounter (Signed)
Eliquis 5mg  refill request received. Patient is 77 years old, weight-87.1kg, Crea-0.98 on 10/10/22, Diagnosis-Afib, and last seen by Dr. Lalla Brothers on 06/28/22. Dose is appropriate based on dosing criteria. Will send in refill to requested pharmacy.

## 2022-11-10 ENCOUNTER — Telehealth: Payer: Self-pay | Admitting: Cardiology

## 2022-11-10 NOTE — Telephone Encounter (Signed)
Spoke with the patient who reports that he still has bruising near his incision site. He states that the bruising has not gotten better or worse has just remained the same. He denies any redness or swelling. He does state that when he does certain movements that he will have a sharp pain that feels like "a stitch being pulled out". It does not hurt when he is sitting, standing, or walking. Only hurts with certain movements such as putting on his shoes. He has been applying both heat and ice to the site. Advised to continue to monitor and if symptoms do not improve we can bring him in to AFIB clinic sooner.

## 2022-11-10 NOTE — Telephone Encounter (Signed)
Patient calling to see how long the bruising and tenderness is suppose to last after his ablation surgery. He does get sharp pain every now and again. Please advise

## 2022-11-14 ENCOUNTER — Ambulatory Visit: Payer: Medicare PPO | Admitting: Physical Medicine and Rehabilitation

## 2022-11-15 ENCOUNTER — Encounter: Payer: Medicare PPO | Attending: Physical Medicine and Rehabilitation | Admitting: Physical Medicine and Rehabilitation

## 2022-11-15 VITALS — BP 143/78 | HR 62 | Ht 72.0 in | Wt 191.0 lb

## 2022-11-15 DIAGNOSIS — M792 Neuralgia and neuritis, unspecified: Secondary | ICD-10-CM | POA: Diagnosis present

## 2022-11-15 DIAGNOSIS — R2689 Other abnormalities of gait and mobility: Secondary | ICD-10-CM | POA: Diagnosis present

## 2022-11-15 DIAGNOSIS — G4700 Insomnia, unspecified: Secondary | ICD-10-CM

## 2022-11-15 DIAGNOSIS — G501 Atypical facial pain: Secondary | ICD-10-CM

## 2022-11-15 DIAGNOSIS — Z6825 Body mass index (BMI) 25.0-25.9, adult: Secondary | ICD-10-CM

## 2022-11-15 DIAGNOSIS — G894 Chronic pain syndrome: Secondary | ICD-10-CM | POA: Diagnosis not present

## 2022-11-15 DIAGNOSIS — E663 Overweight: Secondary | ICD-10-CM

## 2022-11-15 MED ORDER — TOPIRAMATE 25 MG PO TABS
25.0000 mg | ORAL_TABLET | Freq: Every evening | ORAL | 3 refills | Status: DC
Start: 2022-11-15 — End: 2022-12-15

## 2022-11-15 MED ORDER — CAPSAICIN-CLEANSING GEL 8 % EX KIT
2.0000 | PACK | Freq: Once | CUTANEOUS | Status: AC
Start: 2022-11-15 — End: 2022-11-15
  Administered 2022-11-15: 2 via TOPICAL

## 2022-11-15 NOTE — Addendum Note (Signed)
Addended by: Silas Sacramento T on: 11/15/2022 01:07 PM   Modules accepted: Orders

## 2022-11-15 NOTE — Progress Notes (Signed)
Subjective:    Patient ID: Evan Robinson, male    DOB: Aug 08, 1945, 77 y.o.   MRN: [id number]   HPI:  1) Neuropathy:  Evan Robinson is a 77 y.o. male who presents for f/u of facial pain, shoulder pain, and capelike distribution of upper back and neck pain.   -ready to try Qutenza again today He returns for follow-up appointment for chronic pain and medication refill.  -He reports he has facial pain and neck pain radiating into his bilateral shoulders  -his pain is much better controlled since we have started applying the Qutenza patches -he has felt that these and the turmeric have been most helpful for his pain -he feels ready to wean down on his Lyrica to 75mg   His pain has improved since last visit- he has noted improvements with Qutenza -he does feel burning and tingling during the first couple of days after application but this dissapates shortly afterward -he would like to repeat Qutenza today -he would like to try ice packs with application -he does not need refill of lyrica today -he had a good trip with his wife to [place] recently -he plans to travel with her more, moving and staying busy helps his pain He current has no sunburns -he has plans to travel this summer with his wife -he maintains on his prior medications and does not need any further refills today He has burning in left sided facial pain and shoulder It is worst with friction and exacerbated with lying down.  He has been on the Lyrica for years. He is currently 100mg  BID and he is able to tolerate this dose well. He had dizziness with higher doses He has not had cognitive side effects He did well with the memory recall.  He has never tried CBD oil.  Pain worsens with shaving He tried aloe vera without great benefit He is excited to try Qutenza today- it gave him about 1 month of relief last visit.  He is not sure if he has tried carbamazepine Capsaicin made his skin burn and raw in the past.  -He is  interested in trying medical marijuana if it gets approved.  -He asks for a refill of Lyrica -He is ready for Qutenza today -Had a good Christmas and New Year's Family  -he does have some dizziness -had too many side effects from gabapentin  2) Impaired balance:  -would like to try topamax instead of Lyrica as decreasing the Lyrica improved his balance    Pain Inventory Average Pain 8 Pain Right Now 7 My pain is burning  In the last 24 hours, has pain interfered with the following? General activity 7 Relation with others 8 Enjoyment of life 7 What TIME of day is your pain at its worst? evening and night Sleep (in general) Fair  Pain is worse with: sitting Pain improves with: medication Relief from Meds: 4  Family History  Problem Relation Age of Onset   Alcohol abuse Father    Cancer Father        prostate   CAD Father        possible MI   Alcohol abuse Paternal Grandfather    Stroke Mother    Diabetes Neg Hx    Social History   Socioeconomic History   Marital status: Married    Spouse name: Not on file   Number of children: Not on file   Years of education: Not on file   Highest education level: Not on file  Occupational History   Not on file  Tobacco Use   Smoking status: Never   Smokeless tobacco: Never  Vaping Use   Vaping status: Never Used  Substance and Sexual Activity   Alcohol use: No   Drug use: No   Sexual activity: Not on file  Other Topics Concern   Not on file  Social History Narrative   "Gene"   Lives with wife   Occupation: retired, was Dietitian   Activity: walks daily, some gym   Diet:    Social Determinants of Corporate investment banker Strain: Not on file  Food Insecurity: No Food Insecurity (02/17/2022)   [facility]   Hunger Vital Sign    Worried About Running Out of Food in the Last Year: Never true    Ran Out of Food in the Last Year: Never true  Transportation Needs: No  Transportation Needs (02/17/2022)   Received from [facility], [facility]   PRAPARE - Transportation    Lack of Transportation (Medical): No    Lack of Transportation (Non-Medical): No  Physical Activity: Not on file  Stress: Not on file  Social Connections: Not on file   Past Surgical History:  Procedure Laterality Date   ATRIAL FIBRILLATION ABLATION N/A 07/30/2021   Procedure: ATRIAL FIBRILLATION ABLATION;  Surgeon: Lanier Prude, MD;  Location: [facility];  Service: Cardiovascular;  Laterality: N/A;   ATRIAL FIBRILLATION ABLATION N/A 10/25/2022   Procedure: ATRIAL FIBRILLATION ABLATION;  Surgeon: Lanier Prude, MD;  Location: [facility];  Service: Cardiovascular;  Laterality: N/A;   CHOLECYSTECTOMY  2006   COLONOSCOPY WITH PROPOFOL N/A 02/01/2017   Procedure: COLONOSCOPY WITH PROPOFOL;  Surgeon: Toledo, Boykin Nearing, MD;  Location: [facility];  Service: Gastroenterology;  Laterality: N/A;   ESOPHAGOGASTRODUODENOSCOPY (EGD) WITH PROPOFOL N/A 02/01/2017   Procedure: ESOPHAGOGASTRODUODENOSCOPY (EGD) WITH PROPOFOL;  Surgeon: Toledo, Boykin Nearing, MD;  Location: [facility];  Service: Gastroenterology;  Laterality: N/A;   HERNIA REPAIR  08/2010   PROSTATECTOMY  2003   Past Surgical History:  Procedure Laterality Date   ATRIAL FIBRILLATION ABLATION N/A 07/30/2021   Procedure: ATRIAL FIBRILLATION ABLATION;  Surgeon: Lanier Prude, MD;  [facility]: [facility];  Service: Cardiovascular;  Laterality: N/A;   ATRIAL FIBRILLATION ABLATION N/A 10/25/2022   Procedure: ATRIAL FIBRILLATION ABLATION;  Surgeon: Lanier Prude, MD;  Location: [facility];  Service: Cardiovascular;  Laterality: N/A;   CHOLECYSTECTOMY  2006   COLONOSCOPY WITH PROPOFOL N/A 02/01/2017   Procedure: COLONOSCOPY WITH PROPOFOL;  Surgeon: Toledo, Boykin Nearing, MD;  Location: [facility];  Service: Gastroenterology;  Laterality: N/A;   ESOPHAGOGASTRODUODENOSCOPY (EGD) WITH PROPOFOL  N/A 02/01/2017   Procedure: ESOPHAGOGASTRODUODENOSCOPY (EGD) WITH PROPOFOL;  Surgeon: Toledo, Boykin Nearing, MD;  Location: [facility];  Service: Gastroenterology;  Laterality: N/A;   HERNIA REPAIR  08/2010   PROSTATECTOMY  2003   Past Medical History:  Diagnosis Date   Dyslipidemia    H/O prostate cancer 05/02/2001   s/p radical prostatectomy (uro Dr. Rayburn Ma in [place] yearly)   Headache 05/02/2010   thorough w/u - unrevealing.  has seen 3 neurologists, MRI, LP nonacute.  ESR = 3   HTN (hypertension)    Hypogonadism male    Low testosterone    Migraines    OSA on CPAP    Persistent atrial fibrillation (HCC) 06/28/2022   Renal cyst 12/01/2010   complex cyst upper pole L kidney with  no malignancy by MRI   Vertigo    Vitamin D deficiency    There were no vitals taken for this visit.  Opioid Risk Score:   Fall Risk Score:  `1  Depression screen [facility] 2/9     08/15/2022    9:52 AM 02/07/2022   10:16 AM 11/15/2021    1:51 PM 08/26/2021    9:30 AM 06/01/2021    9:08 AM 02/12/2021   11:14 AM 11/27/2020    9:57 AM  Depression screen PHQ 2/9  Decreased Interest 0 0 0 0 0 0 0  Down, Depressed, Hopeless 0 0 0 0 0 0 0  PHQ - 2 Score 0 0 0 0 0 0 0      Review of Systems  Constitutional: Negative.   HENT: Negative.    Eyes: Negative.   Respiratory: Negative.    Cardiovascular: Negative.   Gastrointestinal: Negative.   Endocrine: Negative.   Musculoskeletal:  Positive for neck pain.       Nerve pain , pain in shoulders ,   Skin: Negative.   Allergic/Immunologic: Negative.   Neurological: Negative.   Hematological: Negative.   Psychiatric/Behavioral: Negative.         Objective:   Physical Exam Vitals and nursing note reviewed. BMI 26.63, BP 117/78, weight 194 lbs Constitutional:      Appearance: Normal appearance.  Cardiovascular:     Rate and Rhythm: Normal rate and regular rhythm.     Pulses: Normal pulses.     Heart sounds: Normal heart sounds.  Pulmonary:      Effort: Pulmonary effort is normal.     Breath sounds: Normal breath sounds.  Musculoskeletal:     Cervical back: Normal range of motion and neck supple.     Comments: Normal Muscle Bulk and Muscle Testing Reveals:  Upper Extremities: Full ROM and Muscle Strength  5/5 Lower Extremities: Full ROM and Muscle Strength 5/5 Arises from chair with ease Narrow Based  Gait  Tight cervical myofascia Forward protracted posture Skin:    General: Skin is warm and dry. No open lesions over back or shoulders. No sunburn, erythema after treatment Neurological:     Mental Status: He is alert and oriented to person, place, and time.  Psychiatric:        Mood and Affect: Mood normal.        Behavior: Behavior normal.         Assessment & Plan:   1. 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             Lidoderm patch/ointment, Zyrtec, Tomapax, Almond milk, Elavil, Ketamine infusion (cound not tolerate), hypotherapy, compound medication, hypnosis without benefit             Unable to trial carbamazepine as patient is on Eliquis             Steroids made symptoms more severe             Side effects with Cymbalta, oxcarbazepine, Pamelor             CompletedPhysical Therapy two days a week. Continue HEP  Decrease Lyrica to 100/100/75, add topamax 25mg  in the evening Pain. Continue HEP as Tolerated and Continue to Monitor.   Turmeric to reduce inflammation--can be used in cooking or taken as a supplement.  Benefits of turmeric:  -Highly anti-inflammatory  -Increases antioxidants  -Improves memory, attention, brain disease  -Lowers risk of heart disease  -May help prevent cancer  -Decreases  pain  -Alleviates depression  -Delays aging and decreases risk of chronic disease  -Consume with black pepper to increase absorption    Turmeric Milk Recipe:  1 cup milk  1 tsp turmeric  1 tsp cinnamon  1 tsp grated ginger (optional)  Black pepper (boosts the anti-inflammatory properties of turmeric).  1 tsp honey   Recommend topical CBD oil- discussed its benefits in reducing inflammation, pain, insomnia, and anxiety.  -Discussed that CBD oil differs from marijuana in that it does not contain THC- the substance that causes euphoria.  -Discussed that it is made from the hemp plant.  -It has been used for thousands of years Prescribed Zynex Nexwave, cervical traction device, and heating/cooling blanket  -preliminary research suggests that if may be able to shrink cancerous tumors, stop plaque formation in Alzheimer's Disease, and slow the progress of brain disease from concussions.  -Additional benefits that have been demonstrated in studies include improved nausea, indigestion, and brain health, and reduced seizures.  -In a survey 92% of patients who tried medical cannabis felt it improved symptoms such as chronic pain, arthritis, migraines, and cancer.    -recommended certain CBD brand  -discussed procedures that could potentially help, but could also make the pain worse.   -Provided with a pain relief journal and discussed that it contains foods and lifestyle tips to naturally help to improve pain. Discussed that these lifestyle strategies are also very good for health unlike some medications which can have negative side effects. Discussed that the act of keeping a journal can be therapeutic and helpful to realize patterns what helps to trigger and alleviate pain.    -continue B complex supplement.   -Discussed Qutenza as an option for neuropathic pain control. Discussed that this is a capsaicin patch, stronger than capsaicin cream. Discussed that it is currently approved for  diabetic peripheral neuropathy and post-herpetic neuralgia, but that it has also shown benefit in treating other forms of neuropathy. Provided patient with link to site to learn more about the patch: https://www.clark.biz/. Discussed that the patch would be placed in office and benefits usually last 3 months. Discussed that unintended exposure to capsaicin can cause severe irritation of eyes, mucous membranes, respiratory tract, and skin, but that Qutenza is a local treatment and does not have the systemic side effects of other nerve medications. Discussed that there may be pain, itching, erythema, and decreased sensory function associated with the application of Qutenza. Side effects usually subside within 1 week. A cold pack of analgesic medications can help with these side effects. Blood pressure can also be increased due to pain associated with administration of the patch.   2 patches of Qutenza was applied to the area of pain. Ice packs were applied during the procedure to ensure patient comfort. Blood pressure was monitored every 15 minutes. The patient tolerated the procedure well. Post-procedure instructions were given and follow-up has been scheduled.       2, Chronic Pain Syndrome:  Continue Lyrica, d/c Baclofen 5 mg QHS. Continue to Monitor. No reports of drowsiness. Continue to monitor.  -Discussed benefits of exercise in reducing pain. -prescribed Zynex  cervical traction device and heating blanket -Discussed following foods that may reduce pain: 1) Ginger (especially studied for arthritis)- reduce leukotriene production to decrease inflammation 2) Blueberries- high in phytonutrients that decrease inflammation 3) Salmon- marine omega-3s reduce joint swelling and pain 4) Pumpkin seeds- reduce inflammation 5) dark chocolate- reduces inflammation 6) turmeric- reduces inflammation 7) tart cherries - reduce pain and stiffness 8) extra virgin olive oil - its compound olecanthal helps to block  prostaglandins  9) chili peppers- can be eaten or applied topically via capsaicin 10) mint- helpful for headache, muscle aches, joint pain, and itching 11) garlic- reduces inflammation  Link to further information on diet for chronic pain: http://www.bray.com/   3. Impaired balance: affected by Lyrica. Decrease Lyrica to 100/100/75, add topamax 25mg  HS  4. Hypersensitive skin: discussed that he may feel increased pain from Qutenza due to this.   5. Insomnia: Refilled Lyrica -Try to go outside near sunrise -Get exercise during the day.  -Turn off all devices an hour before bedtime.  -Teas that can benefit: chamomile, valerian root, Brahmi (Bacopa) -Can consider over the counter melatonin, magnesium, and/or L-theanine. Melatonin is an anti-oxidant with multiple health benefits. Magnesium is involved in greater than 300 enzymatic reactions in the body and most of [place] are deficient as our soil is often depleted. There are 7 different types of magnesium- Bioptemizer's is a supplement with all 7 types, and each has unique benefits. Magnesium can also help with constipation and anxiety.  -Pistachios naturally increase the production of melatonin -Cozy Earth bamboo bed sheets are free from toxic chemicals.  -Tart cherry juice or a tart cherry supplement can improve sleep and soreness post-workout   6. Overweight BMI 25.90, weight 191 lbs -discussed that capsaicin has been associated with weight loss -commended on weight loss! -prescribed topamax 25mg  HS  40 minutes spent in discussion of risks and benefits of Qutenza and obtaining informed consent, discussion of q90 day follow-up and expectation of improvement in pain with each repeat application, prescribed topamax 25mg  HS, discussed that facial pain only occurs when shaving, discussed that he still likes to be clean shaven, discussed that he is trying to lose weight and he and  his wife are careful about their diet, decreased decreasing lyrica since it negatively affects his balance, discussed that he and his wife try to stay active and travel

## 2022-11-22 ENCOUNTER — Encounter (HOSPITAL_COMMUNITY): Payer: Self-pay | Admitting: Physician Assistant

## 2022-11-22 ENCOUNTER — Ambulatory Visit (HOSPITAL_COMMUNITY)
Admission: RE | Admit: 2022-11-22 | Discharge: 2022-11-22 | Disposition: A | Discharge status: Home / Self Care | Payer: Medicare PPO | Source: Ambulatory Visit | Attending: Physician Assistant | Admitting: Physician Assistant

## 2022-11-22 VITALS — BP 134/78 | HR 68 | Ht 72.0 in | Wt 189.4 lb

## 2022-11-22 DIAGNOSIS — I10 Essential (primary) hypertension: Secondary | ICD-10-CM | POA: Insufficient documentation

## 2022-11-22 DIAGNOSIS — G4733 Obstructive sleep apnea (adult) (pediatric): Secondary | ICD-10-CM | POA: Insufficient documentation

## 2022-11-22 DIAGNOSIS — I4892 Unspecified atrial flutter: Secondary | ICD-10-CM | POA: Diagnosis not present

## 2022-11-22 DIAGNOSIS — Z79899 Other long term (current) drug therapy: Secondary | ICD-10-CM | POA: Insufficient documentation

## 2022-11-22 DIAGNOSIS — Z7901 Long term (current) use of anticoagulants: Secondary | ICD-10-CM | POA: Insufficient documentation

## 2022-11-22 DIAGNOSIS — I4819 Other persistent atrial fibrillation: Secondary | ICD-10-CM | POA: Insufficient documentation

## 2022-11-22 DIAGNOSIS — I2510 Atherosclerotic heart disease of native coronary artery without angina pectoris: Secondary | ICD-10-CM | POA: Insufficient documentation

## 2022-11-22 DIAGNOSIS — Z5181 Encounter for therapeutic drug level monitoring: Secondary | ICD-10-CM

## 2022-11-22 DIAGNOSIS — I480 Paroxysmal atrial fibrillation: Secondary | ICD-10-CM

## 2022-11-22 DIAGNOSIS — D6869 Other thrombophilia: Secondary | ICD-10-CM | POA: Diagnosis not present

## 2022-11-22 NOTE — Progress Notes (Signed)
Primary Care Physician: Genice Rouge, MD Primary Cardiologist: None Electrophysiologist: Lanier Prude, MD  Referring Physician: [facility]   Evan Robinson is a 77 y.o. male with a history of HTN, OSA, atrial flutter, atrial fibrillation who presents for follow up in the [facility]. The patient was initially maintained on flecainide and underwent afib ablation 07/31/22. He had recurrence of his afib and his flecainide was resumed and he underwent repeat afib ablation and CTI ablation on 10/25/22. Patient is on Eliquis for a CHADS2VASC score of 4.  On follow up today, patient reports that he has done well since his ablaiton. He has not had any symptoms of afib or episodes noted on his smart watch. He denies chest pain or swallowing pain. He did have some left groin tenderness but this in improving.   Today, he denies symptoms of palpitations, chest pain, shortness of breath, orthopnea, PND, lower extremity edema, dizziness, presyncope, syncope, snoring, daytime somnolence, bleeding, or neurologic sequela. The patient is tolerating medications without difficulties and is otherwise without complaint today.    Atrial Fibrillation Risk Factors:  he does have symptoms or diagnosis of sleep apnea. he is compliant with CPAP therapy. he does not have a history of rheumatic fever.   Atrial Fibrillation Management history:  Previous antiarrhythmic drugs: flecainide  Previous cardioversions: none Previous ablations: 07/30/21 Anticoagulation history: Eliquis  ROS- All systems are reviewed and negative except as per the HPI above.   Physical Exam: BP 134/78   Pulse 68   Ht 6' (1.829 m)   Wt 85.9 kg   BMI 25.69 kg/m   GEN: Well nourished, well developed in no acute distress NECK: No JVD; No carotid bruits CARDIAC: Regular rate and rhythm, no murmurs, rubs, gallops RESPIRATORY:  Clear to auscultation without rales, wheezing or rhonchi  ABDOMEN:  Soft, non-tender, non-distended EXTREMITIES:  No edema; No deformity   Wt Readings from Last 3 Encounters:  11/22/22 85.9 kg  11/15/22 86.6 kg  10/25/22 87.1 kg     EKG today demonstrates  SR, LBBB Vent. rate 68 BPM PR interval 184 ms QRS duration 126 ms QT/QTcB 432/459 ms  Echo 05/06/21 demonstrated  1. Left ventricular ejection fraction, by estimation, is 50 to 55%. Left  ventricular ejection fraction by 3D volume is 48 %. The left ventricle has  mildly decreased function. The left ventricle has no regional wall motion  abnormalities. The left ventricular internal cavity size was mildly dilated. Left ventricular diastolic parameters are consistent with Grade I diastolic dysfunction (impaired relaxation).   2. Right ventricular systolic function is normal. The right ventricular  size is normal. There is normal pulmonary artery systolic pressure.   3. The mitral valve is normal in structure. Mild to moderate mitral valve  regurgitation. No evidence of mitral stenosis. 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   4. The aortic valve is normal in structure. There is mild calcification  of the aortic valve. Aortic valve regurgitation is not visualized. Aortic  valve sclerosis/calcification is present, without any evidence of aortic  stenosis.   5. The inferior vena cava is normal in size with greater than 50%  respiratory variability, suggesting right atrial pressure of 3 mmHg.    CHA2DS2-VASc Score = 4  The patient's score is based upon: CHF History: 0 HTN History: 1 Diabetes History: 0 Stroke History: 0 Vascular Disease History: 1 Age Score: 2 Gender Score: 0  ASSESSMENT AND PLAN: Paroxysmal Atrial Fibrillation (ICD10:  I48.0) The patient's CHA2DS2-VASc score is 4, indicating a 4.8% annual risk of stroke.   S/p afib ablation 07/30/21 and afib and flutter ablation 10/25/22 Patient appears to be maintaining SR Continue diltiazem 120  mg daily with 30 mg PRN q 4 hours for heart racing Continue Eliquis 5 mg BID with no missed doses for 3 months post ablation. Continue flecainide 50 mg BID for now. Hopefully will be able to discontinue this post ablation. May need alternate AAD in the future given his h/o CAD.  Secondary Hypercoagulable State (ICD10:  D68.69) The patient is at significant risk for stroke/thromboembolism based upon his CHA2DS2-VASc Score of 4.  Continue Apixaban (Eliquis).   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    Jorja Loa PA-C [facility] [address] [place], [place] [address] [phone]

## 2022-12-05 ENCOUNTER — Other Ambulatory Visit: Payer: Self-pay | Admitting: Physical Medicine and Rehabilitation

## 2022-12-07 ENCOUNTER — Inpatient Hospital Stay (HOSPITAL_COMMUNITY): Admission: RE | Admit: 2022-12-07 | Payer: Medicare PPO | Source: Ambulatory Visit

## 2022-12-07 ENCOUNTER — Ambulatory Visit (HOSPITAL_COMMUNITY)
Admission: RE | Admit: 2022-12-07 | Discharge: 2022-12-07 | Disposition: A | Discharge status: Home / Self Care | Payer: Medicare PPO | Source: Ambulatory Visit | Attending: Physician Assistant | Admitting: Physician Assistant

## 2022-12-07 ENCOUNTER — Encounter (HOSPITAL_COMMUNITY): Payer: Self-pay | Admitting: Physician Assistant

## 2022-12-07 VITALS — BP 122/78 | HR 66 | Ht 72.0 in | Wt 190.6 lb

## 2022-12-07 DIAGNOSIS — I10 Essential (primary) hypertension: Secondary | ICD-10-CM | POA: Diagnosis present

## 2022-12-07 DIAGNOSIS — I341 Nonrheumatic mitral (valve) prolapse: Secondary | ICD-10-CM | POA: Insufficient documentation

## 2022-12-07 DIAGNOSIS — D6869 Other thrombophilia: Secondary | ICD-10-CM

## 2022-12-07 DIAGNOSIS — G4733 Obstructive sleep apnea (adult) (pediatric): Secondary | ICD-10-CM | POA: Insufficient documentation

## 2022-12-07 DIAGNOSIS — R55 Syncope and collapse: Secondary | ICD-10-CM | POA: Diagnosis not present

## 2022-12-07 DIAGNOSIS — I2510 Atherosclerotic heart disease of native coronary artery without angina pectoris: Secondary | ICD-10-CM | POA: Diagnosis not present

## 2022-12-07 DIAGNOSIS — I340 Nonrheumatic mitral (valve) insufficiency: Secondary | ICD-10-CM | POA: Diagnosis not present

## 2022-12-07 DIAGNOSIS — I480 Paroxysmal atrial fibrillation: Secondary | ICD-10-CM

## 2022-12-07 DIAGNOSIS — R9431 Abnormal electrocardiogram [ECG] [EKG]: Secondary | ICD-10-CM | POA: Diagnosis not present

## 2022-12-07 NOTE — Progress Notes (Signed)
Primary Care Physician: Genice Rouge, MD Primary Cardiologist: None Electrophysiologist: Lanier Prude, MD  Referring Physician: [facility]   Evan Robinson is a 77 y.o. male with a history of HTN, OSA, atrial flutter, atrial fibrillation who presents for follow up in the [facility]. The patient was initially maintained on flecainide and underwent afib ablation 07/31/22. He had recurrence of his afib and his flecainide was resumed and he underwent repeat afib ablation and CTI ablation on 10/25/22. Patient is on Eliquis for a CHADS2VASC score of 4.  On follow up today, patient reports that on 12/01/22 he was standing at the refrigerator in his kitchen getting a drink when he had a syncopal episode, witnessed by his wife. Fortunately, he did not sustain any head injuries. He had no symptoms leading up to the event. He regained consciousness within a couple seconds. His wife checked his BP and it was 80s/50s. He has never had any syncopal events previously. This is not a typical symptom of his afib. He is in SR today.   Today, he denies symptoms of palpitations, chest pain, shortness of breath, orthopnea, PND, lower extremity edema, snoring, daytime somnolence, bleeding, or neurologic sequela. The patient is tolerating medications without difficulties and is otherwise without complaint today.    Atrial Fibrillation Risk Factors:  he does have symptoms or diagnosis of sleep apnea. he is compliant with CPAP therapy. he does not have a history of rheumatic fever.   Atrial Fibrillation Management history:  Previous antiarrhythmic drugs: flecainide  Previous cardioversions: none Previous ablations: 07/30/21 Anticoagulation history: Eliquis  ROS- All systems are reviewed and negative except as per the HPI above.   Physical Exam: BP 122/78   Pulse 66   Ht 6' (1.829 m)   Wt 86.5 kg   BMI 25.85 kg/m   GEN: Well nourished, well developed in no acute  distress NECK: No JVD; No carotid bruits CARDIAC: Regular rate and rhythm, no murmurs, rubs, gallops RESPIRATORY:  Clear to auscultation without rales, wheezing or rhonchi  ABDOMEN: Soft, non-tender, non-distended EXTREMITIES:  No edema; No deformity    Wt Readings from Last 3 Encounters:  12/07/22 86.5 kg  11/22/22 85.9 kg  11/15/22 86.6 kg     EKG today demonstrates  SR, LAFB Vent. rate 66 BPM PR interval 192 ms QRS duration 116 ms QT/QTcB 422/442 ms  Echo 05/06/21 demonstrated  1. Left ventricular ejection fraction, by estimation, is 50 to 55%. Left  ventricular ejection fraction by 3D volume is 48 %. The left ventricle has  mildly decreased function. The left ventricle has no regional wall motion  abnormalities. The left ventricular internal cavity size was mildly dilated. Left ventricular diastolic parameters are consistent with Grade I diastolic dysfunction (impaired relaxation).   2. Right ventricular systolic function is normal. The right ventricular  size is normal. There is normal pulmonary artery systolic pressure.   3. The mitral valve is normal in structure. Mild to moderate mitral valve  regurgitation. No evidence of mitral stenosis. 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   4. The aortic valve is normal in structure. There is mild calcification  of the aortic valve. Aortic valve regurgitation is not visualized. Aortic  valve sclerosis/calcification is present, without any evidence of aortic  stenosis.   5. The inferior vena cava is normal in size with greater than 50%  respiratory variability, suggesting right atrial pressure of 3 mmHg.  CHA2DS2-VASc Score = 4  The patient's score is based upon: CHF History: 0 HTN History: 1 Diabetes History: 0 Stroke History: 0 Vascular Disease History: 1 Age Score: 2 Gender Score: 0       ASSESSMENT AND PLAN: Paroxysmal Atrial Fibrillation (ICD10:  I48.0) The patient's  CHA2DS2-VASc score is 4, indicating a 4.8% annual risk of stroke.   S/p afib ablation 07/30/21 and afib and flutter ablation 10/25/22 Continue diltiazem 120 mg daily with 30 mg PRN q 4 hours for heart racing Continue Eliquis 5 mg BID with no missed doses for 3 months post ablation. Continue flecainide 50 mg BID for now. Hopefully will be able to discontinue this post ablation. May need alternate AAD in the future given his h/o CAD.  Secondary Hypercoagulable State (ICD10:  D68.69) The patient is at significant risk for stroke/thromboembolism based upon his CHA2DS2-VASc Score of 4.  Continue Apixaban (Eliquis).   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 This has not been a typical symptom of his afib previously. Denies head trauma. Witnessed by wife.  Will check 2 week Zio monitor to screen for arrhythmogenic cause.  Check echocardiogram Check carotid ultrasound Also encouraged him to f/u with his PCP Recommendation for 6 month driving restriction reviewed with patient today.     Follow up with Dr Lalla Brothers as scheduled.    Jorja Loa PA-C [facility] [address] [place], [place] [address] [phone]

## 2022-12-12 ENCOUNTER — Encounter: Payer: Self-pay | Admitting: Cardiology

## 2022-12-15 ENCOUNTER — Observation Stay (HOSPITAL_COMMUNITY)
Admission: EM | Admit: 2022-12-15 | Discharge: 2022-12-17 | Disposition: A | Discharge status: Home / Self Care | Payer: Medicare PPO | Attending: Family Medicine | Admitting: Family Medicine

## 2022-12-15 ENCOUNTER — Emergency Department (HOSPITAL_COMMUNITY): Payer: Medicare PPO

## 2022-12-15 ENCOUNTER — Encounter (HOSPITAL_COMMUNITY): Payer: Self-pay

## 2022-12-15 ENCOUNTER — Other Ambulatory Visit: Payer: Self-pay

## 2022-12-15 DIAGNOSIS — I2510 Atherosclerotic heart disease of native coronary artery without angina pectoris: Secondary | ICD-10-CM | POA: Insufficient documentation

## 2022-12-15 DIAGNOSIS — Z7901 Long term (current) use of anticoagulants: Secondary | ICD-10-CM | POA: Diagnosis not present

## 2022-12-15 DIAGNOSIS — R2681 Unsteadiness on feet: Secondary | ICD-10-CM | POA: Insufficient documentation

## 2022-12-15 DIAGNOSIS — R0602 Shortness of breath: Secondary | ICD-10-CM | POA: Insufficient documentation

## 2022-12-15 DIAGNOSIS — Z9889 Other specified postprocedural states: Secondary | ICD-10-CM | POA: Diagnosis not present

## 2022-12-15 DIAGNOSIS — I10 Essential (primary) hypertension: Secondary | ICD-10-CM | POA: Diagnosis not present

## 2022-12-15 DIAGNOSIS — E878 Other disorders of electrolyte and fluid balance, not elsewhere classified: Secondary | ICD-10-CM | POA: Diagnosis not present

## 2022-12-15 DIAGNOSIS — R55 Syncope and collapse: Secondary | ICD-10-CM | POA: Diagnosis present

## 2022-12-15 DIAGNOSIS — I4819 Other persistent atrial fibrillation: Secondary | ICD-10-CM | POA: Diagnosis not present

## 2022-12-15 DIAGNOSIS — G44229 Chronic tension-type headache, not intractable: Secondary | ICD-10-CM | POA: Insufficient documentation

## 2022-12-15 DIAGNOSIS — Z853 Personal history of malignant neoplasm of breast: Secondary | ICD-10-CM | POA: Insufficient documentation

## 2022-12-15 LAB — URINALYSIS, W/ REFLEX TO CULTURE (INFECTION SUSPECTED)
Bacteria, UA: NONE SEEN
Bilirubin Urine: NEGATIVE
Glucose, UA: NEGATIVE mg/dL
Hgb urine dipstick: NEGATIVE
Ketones, ur: NEGATIVE mg/dL
Leukocytes,Ua: NEGATIVE
Nitrite: NEGATIVE
Protein, ur: NEGATIVE mg/dL
Specific Gravity, Urine: 1.014 (ref 1.005–1.030)
pH: 7 (ref 5.0–8.0)

## 2022-12-15 LAB — CBC WITH DIFFERENTIAL/PLATELET
Abs Immature Granulocytes: 0.05 10*3/uL (ref 0.00–0.07)
Basophils Absolute: 0 10*3/uL (ref 0.0–0.1)
Basophils Relative: 0 %
Eosinophils Absolute: 0.1 10*3/uL (ref 0.0–0.5)
Eosinophils Relative: 1 %
HCT: 41.1 % (ref 39.0–52.0)
Hemoglobin: 14.4 g/dL (ref 13.0–17.0)
Immature Granulocytes: 0 %
Lymphocytes Relative: 7 %
Lymphs Abs: 0.8 10*3/uL (ref 0.7–4.0)
MCH: 30.6 pg (ref 26.0–34.0)
MCHC: 35 g/dL (ref 30.0–36.0)
MCV: 87.4 fL (ref 80.0–100.0)
Monocytes Absolute: 0.8 10*3/uL (ref 0.1–1.0)
Monocytes Relative: 7 %
Neutro Abs: 9.9 10*3/uL — ABNORMAL HIGH (ref 1.7–7.7)
Neutrophils Relative %: 85 %
Platelets: 191 10*3/uL (ref 150–400)
RBC: 4.7 MIL/uL (ref 4.22–5.81)
RDW: 12.7 % (ref 11.5–15.5)
WBC: 11.7 10*3/uL — ABNORMAL HIGH (ref 4.0–10.5)
nRBC: 0 % (ref 0.0–0.2)

## 2022-12-15 LAB — COMPREHENSIVE METABOLIC PANEL
ALT: 15 U/L (ref 0–44)
AST: 23 U/L (ref 15–41)
Albumin: 4.3 g/dL (ref 3.5–5.0)
Alkaline Phosphatase: 75 U/L (ref 38–126)
Anion gap: 11 (ref 5–15)
BUN: 15 mg/dL (ref 8–23)
CO2: 24 mmol/L (ref 22–32)
Calcium: 8.9 mg/dL (ref 8.9–10.3)
Chloride: 93 mmol/L — ABNORMAL LOW (ref 98–111)
Creatinine, Ser: 1.38 mg/dL — ABNORMAL HIGH (ref 0.61–1.24)
GFR, Estimated: 53 mL/min — ABNORMAL LOW (ref >60)
Glucose, Bld: 141 mg/dL — ABNORMAL HIGH (ref 70–99)
Potassium: 3.6 mmol/L (ref 3.5–5.1)
Sodium: 128 mmol/L — ABNORMAL LOW (ref 135–145)
Total Bilirubin: 0.9 mg/dL (ref 0.3–1.2)
Total Protein: 6.7 g/dL (ref 6.5–8.1)

## 2022-12-15 LAB — I-STAT CHEM 8, ED
BUN: 16 mg/dL (ref 8–23)
Calcium, Ion: 1.11 mmol/L — ABNORMAL LOW (ref 1.15–1.40)
Chloride: 93 mmol/L — ABNORMAL LOW (ref 98–111)
Creatinine, Ser: 1.4 mg/dL — ABNORMAL HIGH (ref 0.61–1.24)
Glucose, Bld: 140 mg/dL — ABNORMAL HIGH (ref 70–99)
HCT: 43 % (ref 39.0–52.0)
Hemoglobin: 14.6 g/dL (ref 13.0–17.0)
Potassium: 3.6 mmol/L (ref 3.5–5.1)
Sodium: 130 mmol/L — ABNORMAL LOW (ref 135–145)
TCO2: 24 mmol/L (ref 22–32)

## 2022-12-15 LAB — TROPONIN I (HIGH SENSITIVITY)
Troponin I (High Sensitivity): 4 ng/L (ref <18)
Troponin I (High Sensitivity): 4 ng/L (ref <18)

## 2022-12-15 LAB — VITAMIN B12: Vitamin B-12: 662 pg/mL (ref 180–914)

## 2022-12-15 LAB — TSH: TSH: 0.84 u[IU]/mL (ref 0.350–4.500)

## 2022-12-15 LAB — MAGNESIUM: Magnesium: 1.7 mg/dL (ref 1.7–2.4)

## 2022-12-15 LAB — BRAIN NATRIURETIC PEPTIDE: B Natriuretic Peptide: 57.3 pg/mL (ref 0.0–100.0)

## 2022-12-15 MED ORDER — SODIUM CHLORIDE 0.9% FLUSH
3.0000 mL | Freq: Two times a day (BID) | INTRAVENOUS | Status: DC
  Administered 2022-12-15 – 2022-12-17 (×4): 3 mL via INTRAVENOUS

## 2022-12-15 MED ORDER — BACLOFEN 10 MG PO TABS: 5.0000 mg | ORAL_TABLET | ORAL | Status: DC | PRN

## 2022-12-15 MED ORDER — PREGABALIN 100 MG PO CAPS
100.0000 mg | ORAL_CAPSULE | Freq: Three times a day (TID) | ORAL | Status: DC
  Administered 2022-12-15 – 2022-12-17 (×5): 100 mg via ORAL
  Filled 2022-12-15 (×5): qty 1

## 2022-12-15 MED ORDER — ZOLPIDEM TARTRATE 5 MG PO TABS
10.0000 mg | ORAL_TABLET | Freq: Every day | ORAL | Status: DC
  Administered 2022-12-15 – 2022-12-16 (×2): 10 mg via ORAL
  Filled 2022-12-15 (×2): qty 2

## 2022-12-15 MED ORDER — SUMATRIPTAN SUCCINATE 50 MG PO TABS
50.0000 mg | ORAL_TABLET | Freq: Once | ORAL | Status: DC | PRN
  Filled 2022-12-15: qty 1

## 2022-12-15 MED ORDER — IOHEXOL 350 MG/ML SOLN
75.0000 mL | Freq: Once | INTRAVENOUS | Status: AC | PRN
  Administered 2022-12-15: 75 mL via INTRAVENOUS

## 2022-12-15 MED ORDER — APIXABAN 5 MG PO TABS
5.0000 mg | ORAL_TABLET | Freq: Two times a day (BID) | ORAL | Status: DC
  Administered 2022-12-15 – 2022-12-17 (×4): 5 mg via ORAL
  Filled 2022-12-15 (×4): qty 1

## 2022-12-15 MED ORDER — DILTIAZEM HCL 30 MG PO TABS: 30.0000 mg | ORAL_TABLET | ORAL | Status: DC | PRN

## 2022-12-15 MED ORDER — ACETAMINOPHEN 325 MG PO TABS
650.0000 mg | ORAL_TABLET | Freq: Four times a day (QID) | ORAL | Status: DC | PRN
  Administered 2022-12-15: 650 mg via ORAL
  Filled 2022-12-15: qty 2

## 2022-12-15 MED ORDER — DILTIAZEM HCL ER COATED BEADS 120 MG PO CP24
120.0000 mg | ORAL_CAPSULE | Freq: Every day | ORAL | Status: DC
  Administered 2022-12-16 – 2022-12-17 (×2): 120 mg via ORAL
  Filled 2022-12-15 (×2): qty 1

## 2022-12-15 MED ORDER — LACTATED RINGERS IV BOLUS
1000.0000 mL | Freq: Once | INTRAVENOUS | Status: AC
  Administered 2022-12-15: 1000 mL via INTRAVENOUS

## 2022-12-15 MED ORDER — LOSARTAN POTASSIUM 50 MG PO TABS
100.0000 mg | ORAL_TABLET | Freq: Every day | ORAL | Status: DC
  Administered 2022-12-16 – 2022-12-17 (×2): 100 mg via ORAL
  Filled 2022-12-15 (×2): qty 2

## 2022-12-15 MED ORDER — HYDROCHLOROTHIAZIDE 25 MG PO TABS
25.0000 mg | ORAL_TABLET | Freq: Every day | ORAL | Status: DC
  Administered 2022-12-16: 25 mg via ORAL
  Filled 2022-12-15: qty 1

## 2022-12-15 MED ORDER — FLECAINIDE ACETATE 50 MG PO TABS
50.0000 mg | ORAL_TABLET | Freq: Two times a day (BID) | ORAL | Status: DC
  Administered 2022-12-15 – 2022-12-17 (×4): 50 mg via ORAL
  Filled 2022-12-15 (×5): qty 1

## 2022-12-15 NOTE — Hospital Course (Addendum)
Evan Robinson is a 77 y.o.male with a history of A-fib on Eliquis, status post ablation, hypertension, vertigo, migraines, CAD who was admitted to the family medicine Teaching Service at [facility] for syncope. His hospital course is detailed below:   Syncope Upon presentation, patient believes that he passed out today while he was shopping.  His wife found him in the car and he was not responsive.  The patient also reported having a massive headache, different from his normal migraines.  He reported nausea and sweating before the syncopal episode.  The patient reports a similar instance 2 weeks prior where cardiology evaluated him and he was placed on a ZIO monitor at that time.  Admission labs were rather unremarkable, with a troponin of 4.  EKG did show A-fib and no ischemic changes.  Chest x-ray showed no acute findings with a stable elevated right hemidiaphragm.  CT head and neck was also negative for acute findings however with mild arthrosclerotic changes of the bilateral carotid bifurcations without significant stenosis. Cardiology was consulted during inpatient and recommended attempting to get information from recent zio patch to rule in/out arrhythmia as a cause of syncope. They also recommended holding his home hydrochlorothiazide to prevent hypotension  Echo showed an EF 45-50%, moderate mitral valve prolapse and no evidence of aortic stenosis. .    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

## 2022-12-15 NOTE — ED Triage Notes (Addendum)
Pt came to ED, pt was found unresponsive in car by wife and confused. Wife states its not his first time being confused like this. Hx of vertigo. VSS. Axox4. Pt on elliquis for afib

## 2022-12-15 NOTE — H&P (Addendum)
Hospital Admission History and Physical Service Pager: [phone]  Patient name: Evan Robinson Medical record number: [id number] Date of Birth: [birth year] Age: 77 y.o. Gender: male  Primary Care Provider: Genice Rouge, MD Consultants: none Code Status: DNR, confirmed with patient  Preferred Emergency Contact:  Contact Information     Name Relation Home Work Mobile   Macomber,Melody Spouse   [phone]      Other Contacts   None on File      Chief Complaint: Syncope   Assessment and Plan: Evan Robinson is a 77 y.o. male with PMH of A-fib on Eliquis, status post ablation, hypertension, vertigo, migraines, CAD, presenting with syncope . Differential for presentation of this includes cardiac syncope 2/2 ischemia, arrhythmia, carotid stenosis.  Less likely patient having seizures as did not have any lesions on CTA that would cause mass effect, no fever or infectious/meningeal signs, unlikely primary epileptic disorder.  Could be orthostatic or vasovagal however unlikely given that patient was already standing episodes have happened in different situations.  Patient is on Ambien and Lyrica however has been on these medications chronically unlikely to acutely cause syncope.  [facility]     * (Principal) Syncope     Most likely cardiac.  - Admit to FPTS, Med Tele, Attending Dr. Deirdre Priest - Consult cardiology in the AM - Continue A-fib medications as below - Follow-up carotid ultrasound - Follow-up echo - Cardiac monitoring -TSH and B12 - Will continue Lyrica and Ambien given chronic medications and want to  avoid withdrawal     Chronic and Stable Problems Persistent atrial fibrillation: Continue diltiazem 120 mg, flecainide 50 mg, apixaban 5 mg twice daily Hypertension: Continue losartan 100 mg and hydrochlorothiazide 25 mg C8 radiculopathy: Continue Lyrica 100 mg 3 times daily, baclofen as needed OSA: Continue CPAP at night and Ambien 10 mg   FEN/GI:  Regular VTE Prophylaxis: Continue home eliquis  Disposition: admit to FMTS  History of Present Illness:  Evan Robinson is a 78 y.o. male presenting with syncope.   Patient said that he thinks he passed out today. He says that he was shopping and went to sit in the car and does not remember what happened. His wife found him in the car and he was not responsive. He says when he woke up he had a massive headache. He said he felt like "his blood was leaving his body". He had a headache last night but otherwise no symptoms. He did not take any medications for the headache. This felt different from his normal migraine. He said it was the worst headache he had ever had. He said he felt a little run down. Says he did feel nauseous clammy and sweaty right before the event. He also describes vision going out. Denies feeling more light headed with standing and sitting. Denies tinnitis. Not similar to past vertigo. No spinning.   He says he walked 3 miles yesterday and was ok.   Says two weeks ago he passed out. Was seen by cardiology, no head injuries or prodromal symptoms. Regained consciousness within a few seconds.  Blood pressure was 80s/50s.  Two week Zio monitor placed at that time.  States this was the first time he passed out.  He says he always feels a little unsteady because he takes lyrica for his c8 radiculopathy but has not felt more than normal.   No illness recently. No travel or sick contacts.  Patient was recently seen by cardiologist  after for syncopal episode on 8/1 witnessed by his wife.  He has not had any head injuries.  Always regained consciousness within few seconds.  Cardiologist had suspicion that syncopal episode was cardiac related and not usual of his A-fib semiology.  Zio monitor was placed at that time and patient was scheduled for echo and carotid ultrasound but had not been able to go before this event and admission.  In the ED, patient was hemodynamically stable.  No  abnormalities on labs. He was tired but alert and oriented. He was given a bolus and had a CTA to evaluate for LVO. It was negative. CXR was negative as well.   Review Of Systems: Per HPI with the following additions: as above  Pertinent Past Medical History: Afib and Aflutter HTN OSA CAD C8 radiculopathy Prostate cancer s/p radical prostatectomy  Migraines Vertigo  Remainder reviewed in history tab.   Pertinent Past Surgical History: CTI ablation 07/31/22 and 10/25/22 Radical prostatectomy 2003 Remainder reviewed in history tab.   Pertinent Social History: Tobacco use: Never Alcohol use: no Other Substance use: no Lives with wife  Pertinent Family History: Father heart attack at 35, other family members passed away with heart attacks   Remainder reviewed in history tab.   Important Outpatient Medications: Flecainide 50 mg twice daily Diltiazem 120 mg daily, 30 mg as needed Q4 for heart racing Eliquis 5 mg twice daily x 3 months post ablation (10/25/2022) Baclofen 5 mg daily (as needed) Topiramate 25 mg daily (not taking does not work for him) Protonix 40 mg daily (only short term during ablation) Lyrica 100 mg 3 times daily HCTZ 25 mg daily Losartan 25 mg daily Ambien 10 mg daily (to sleep with cpap)  Remainder reviewed in medication history.   Objective: BP 139/76   Pulse (!) 55   Temp 97.6 F (36.4 C) (Temporal)   Resp 15   Ht 6' (1.829 m)   Wt 86.2 kg   SpO2 100%   BMI 25.77 kg/m  Exam: General: elder, no acute distress Eyes: Pupils equal round and reactive to light, extraocular movements intact, no scleral icterus HEENT: Mucous membranes moist Neck: No JVD, supple Cardiovascular: Irregularly irregular rhythm, regular rate, no murmurs rubs or gallops Respiratory: Auscultation bilaterally no wheezes rales or rhonchi Gastrointestinal: Bowel sounds present, soft nontender nondistended MSK: Living all 4 extremities spontaneously Derm: Warm, dry Neuro:  CN II through XII tact, no focal neurological deficits, no sensation deficits, upper extremity and lower extremity strength 5 out of 5 Psych: Pleasant mood and appropriate affect  Labs:  CBC BMET  Recent Labs  Lab 12/15/22 1422 12/15/22 1458  WBC 11.7*  --   HGB 14.4 14.6  HCT 41.1 43.0  PLT 191  --    Recent Labs  Lab 12/15/22 1422 12/15/22 1458  NA 128* 130*  K 3.6 3.6  CL 93* 93*  CO2 24  --   BUN 15 16  CREATININE 1.38* 1.40*  GLUCOSE 141* 140*  CALCIUM 8.9  --     Pertinent additional labs troponin 4.   EKG: Atrial fibrillation, normal rate, QTc 402 no ischemic signs   Imaging Studies Performed:  CXR: no acute findings, stable elevated R hemidiaphragm CTA head/neck: no acute findings, mild atherosclerotic changes of BL carotid bifurcations without significant stenosis   Upper Level Attestation I have seen and examined the patient with the resident Dr.Hindel. I agree with the history, physical, and assessment above with any necessary edits.    Lockie Mola,  MD 12/15/2022, 7:24 PM PGY-2, [facility]  FPTS Intern pager: [phone], text pages welcome Secure chat group [facility] Teaching Service

## 2022-12-15 NOTE — ED Provider Notes (Signed)
[facility] AT [facility] Provider Note   CSN: [id number] Arrival date & time: 12/15/22  1358     History  Chief Complaint  Patient presents with   Altered Mental Status    Domonik [place] is a 77 y.o. male.  HPI   Patient is a 77 year old male with past medical history of HTN, OSA, atrial flutter, atrial fibrillation, presented today after an episode of lightheadedness and altered mental status.  Family the bedside states that he had an episode of lightheadedness approximately 1 to 2 weeks ago.  He then had a syncopal episode in which he struck his head on the counter when he fell.  They note that they were seen by cardiology within that similar timeframe.  He did not have any head imaging performed but he was placed on a Zio patch.  Today, he was shopping and began feeling lightheaded.  He came to sit down in the car. He does not remember what  happened next. Family states that he was confused and they were unsure as to whether or not he had an additional syncopal episode.  He denies any headache at this time.  He denies any chest pain or shortness of breath.  He does admit to persistent lightheadedness.  He denies any lower extremity swelling or tenderness. He is on eliquis for his afib, which he last took this morning.   Home Medications Prior to Admission medications   Medication Sig Start Date End Date Taking? Authorizing Provider  ALPRAZolam Prudy Feeler) 0.5 MG tablet Take 0.5 mg by mouth 2 (two) times daily as needed for anxiety. 09/22/20   [provider]  b complex vitamins capsule Take 1 capsule by mouth in the morning.    [provider]  baclofen (LIORESAL) 10 MG tablet TAKE 1/2 TABLET BY MOUTH EVERY NIGHT AT BEDTIME Patient taking differently: as needed. 12/06/22   Raulkar, Drema Pry, MD  Carboxymethylcellul-Glycerin (CLEAR EYES FOR DRY EYES OP) Place 1 drop into both eyes 3 (three) times daily as needed (dry/irritated eyes.).     [provider]  Cholecalciferol (VITAMIN D) 50 MCG (2000 UT) CAPS Take 2,000 Units by mouth in the morning.    [provider]  colchicine 0.6 MG tablet Take 1 tablet (0.6 mg total) by mouth 2 (two) times daily for 5 days. 10/25/22 10/30/22  Sheilah Pigeon, PA-C  diltiazem (CARDIZEM CD) 120 MG 24 hr capsule TAKE 1 CAPSULE BY MOUTH DAILY 11/07/22   Lanier Prude, MD  diltiazem (CARDIZEM) 30 MG tablet Take 1 Tablet Every 4 Hours As Needed For HR >100 and Top BP>100 03/24/21   Newman Nip, NP  ELIQUIS 5 MG TABS tablet TAKE 1 TABLET(5 MG) BY MOUTH TWICE DAILY 11/07/22   Lanier Prude, MD  flecainide (TAMBOCOR) 50 MG tablet Take 1 tablet (50 mg total) by mouth 2 (two) times daily. 03/31/22   Sheilah Pigeon, PA-C  gemfibrozil (LOPID) 600 MG tablet Take 600 mg by mouth daily before breakfast.    [provider]  hydrochlorothiazide (HYDRODIURIL) 25 MG tablet TAKE 1 TABLET BY MOUTH DAILY 05/18/22   Lanier Prude, MD  HYDROcodone-acetaminophen (NORCO/VICODIN) 5-325 MG per tablet Take 1 tablet by mouth every 6 (six) hours as needed (headache).    [provider]  losartan (COZAAR) 25 MG tablet TAKE 1 TABLET(25 MG) BY MOUTH EVERY DAY 05/16/22   Lanier Prude, MD  meclizine (ANTIVERT) 25 MG tablet Take 1 tablet (25 mg  total) by mouth 3 (three) times daily as needed for dizziness. 10/04/21   Tegeler, Canary Brim, MD  pantoprazole (PROTONIX) 40 MG tablet Take 1 tablet (40 mg total) by mouth daily. Patient not taking: Reported on 12/07/2022 10/25/22 12/09/22  Sheilah Pigeon, PA-C  pregabalin (LYRICA) 100 MG capsule Take 100 mg by mouth 3 (three) times daily. 10/14/22   [provider]  Rimegepant Sulfate (NURTEC) 75 MG TBDP Take 1 tablet by mouth every other day. Patient taking differently: Take 75 mg by mouth every other day as needed (migraines). 10/13/21   Raulkar, Drema Pry, MD  SUMAtriptan (IMITREX) 50 MG tablet Take 1 tablet (50 mg total) by  mouth once as needed for migraine. May repeat in 2 hours if headache persists or recurs. 10/15/21 10/20/24  Horton Chin, MD  topiramate (TOPAMAX) 25 MG tablet Take 1 tablet (25 mg total) by mouth at bedtime. Patient not taking: Reported on 12/07/2022 11/15/22   Horton Chin, MD  vitamin C (ASCORBIC ACID) 500 MG tablet Take 500 mg by mouth in the morning.    [provider]  zolpidem (AMBIEN) 10 MG tablet Take 10 mg by mouth at bedtime. 12/27/21   [provider]      Allergies    Fenofibrate    Review of Systems   Review of Systems Negative except for as noted above in the HPI  Physical Exam Updated Vital Signs BP 121/65   Pulse (!) 53   Temp 97.6 F (36.4 C) (Temporal)   Resp 15   Ht 6' (1.829 m)   Wt 86.2 kg   SpO2 100%   BMI 25.77 kg/m  Physical Exam Vitals and nursing note reviewed.  Constitutional:      General: He is not in acute distress.    Appearance: He is well-developed.  HENT:     Head: Normocephalic and atraumatic.     Mouth/Throat:     Mouth: Mucous membranes are moist.  Eyes:     Extraocular Movements: Extraocular movements intact.     Conjunctiva/sclera: Conjunctivae normal.     Pupils: Pupils are equal, round, and reactive to light.  Cardiovascular:     Rate and Rhythm: Normal rate. Rhythm irregular.     Heart sounds: No murmur heard.    No friction rub.  Pulmonary:     Effort: Pulmonary effort is normal. No respiratory distress.     Breath sounds: Normal breath sounds.     Comments: Saturating well on room air Abdominal:     Palpations: Abdomen is soft.     Tenderness: There is no abdominal tenderness.  Musculoskeletal:        General: No swelling or tenderness.     Cervical back: Neck supple.  Skin:    General: Skin is warm and dry.     Capillary Refill: Capillary refill takes less than 2 seconds.  Neurological:     Mental Status: He is alert and oriented to person, place, and time.     Cranial Nerves: No cranial  nerve deficit.     Sensory: No sensory deficit.     Motor: No weakness.  Psychiatric:        Mood and Affect: Mood normal.     ED Results / Procedures / Treatments   Labs (all labs ordered are listed, but only abnormal results are displayed) Labs Reviewed  CBC WITH DIFFERENTIAL/PLATELET - Abnormal; Notable for the following components:      Result Value   WBC  11.7 (*)    Neutro Abs 9.9 (*)    All other components within normal limits  COMPREHENSIVE METABOLIC PANEL - Abnormal; Notable for the following components:   Sodium 128 (*)    Chloride 93 (*)    Glucose, Bld 141 (*)    Creatinine, Ser 1.38 (*)    GFR, Estimated 53 (*)    All other components within normal limits  I-STAT CHEM 8, ED - Abnormal; Notable for the following components:   Sodium 130 (*)    Chloride 93 (*)    Creatinine, Ser 1.40 (*)    Glucose, Bld 140 (*)    Calcium, Ion 1.11 (*)    All other components within normal limits  MAGNESIUM  URINALYSIS, W/ REFLEX TO CULTURE (INFECTION SUSPECTED)  BRAIN NATRIURETIC PEPTIDE  TROPONIN I (HIGH SENSITIVITY)  TROPONIN I (HIGH SENSITIVITY)    EKG EKG Interpretation Date/Time:  Thursday December 15 2022 14:16:57 EDT Ventricular Rate:  66 PR Interval:    QRS Duration:  135 QT Interval:  383 QTC Calculation: 402 R Axis:   -69  Text Interpretation: Atrial fibrillation Nonspecific IVCD with LAD Consider anterior infarct Confirmed by Blane Ohara [phone]) on 12/15/2022 2:40:34 PM  Radiology CT ANGIO HEAD NECK W WO CM  Result Date: 12/15/2022 CLINICAL DATA:  Syncope/presyncope. Cerebrovascular cause suspected. EXAM: CT ANGIOGRAPHY HEAD AND NECK WITH AND WITHOUT CONTRAST TECHNIQUE: Multidetector CT imaging of the head and neck was performed using the standard protocol during bolus administration of intravenous contrast. Multiplanar CT image reconstructions and MIPs were obtained to evaluate the vascular anatomy. Carotid stenosis measurements (when applicable) are  obtained utilizing NASCET criteria, using the distal internal carotid diameter as the denominator. RADIATION DOSE REDUCTION: This exam was performed according to the departmental dose-optimization program which includes automated exposure control, adjustment of the mA and/or kV according to patient size and/or use of iterative reconstruction technique. CONTRAST:  75mL OMNIPAQUE IOHEXOL 350 MG/ML SOLN COMPARISON:  CT and MRI October 04, 2021. FINDINGS: CT HEAD FINDINGS Brain: No evidence of acute infarction, hemorrhage, hydrocephalus, extra-axial collection or mass lesion/mass effect. Mild parenchymal volume loss, more evident at the cerebellar vermis. Vascular: No hyperdense vessel or unexpected calcification. Skull: Normal. Negative for fracture or focal lesion. Sinuses/Orbits: Mild mucosal thickening of the right maxillary sinus and ethmoid cells. The orbits are maintained. Other: None. Review of the MIP images confirms the above findings CTA NECK FINDINGS Aortic arch: Common origin of the innominate and left common carotid artery from the aortic arch. Imaged portion shows no evidence of aneurysm or dissection. Atherosclerotic disease with mixed density plaque measuring up to 4 mm. No significant stenosis of the major arch vessel origins. Right carotid system: Mild atherosclerotic disease of the right carotid bifurcation without hemodynamically significant stenosis. Left carotid system: Mild atherosclerotic disease of the left carotid bifurcation without hemodynamically significant stenosis. Vertebral arteries: Calcified atherosclerotic changes at the origin of the right subclavian artery without hemodynamically significant stenosis. No evidence of significant atherosclerotic disease or stenosis along the cervical course of the bilateral vertebral arteries. Skeleton: Diffuse heterogeneity of the visualized spine with lucencies and coarse trabeculated. Flowing syndesmophytes along the cervical spine and visualized  upper thoracic spine. Other neck: Negative. Upper chest: Coronary calcified atherosclerotic disease. Review of the MIP images confirms the above findings CTA HEAD FINDINGS Anterior circulation: Mild atherosclerotic changes in the bilateral carotid siphons without significant stenosis. A 2-3 mm outpouching from the supraclinoid right ICA at the origin of the posterior communicating artery is favored  to represent an infundibulum. No significant stenosis, proximal occlusion, aneurysm or vascular malformation. Posterior circulation: No significant stenosis, proximal occlusion, aneurysm, or vascular malformation. Venous sinuses: As permitted by contrast timing, patent. Anatomic variants: None significant. Review of the MIP images confirms the above findings IMPRESSION: 1. No acute intracranial abnormality. 2. No intracranial large vessel occlusion or significant stenosis. 3. Mild atherosclerotic changes of the bilateral carotid bifurcations and carotid siphons without hemodynamically significant stenosis. Electronically Signed   By: Baldemar Lenis M.D.   On: 12/15/2022 16:29      Medications Ordered in ED Medications  lactated ringers bolus 1,000 mL (0 mLs Intravenous Stopped 12/15/22 1639)  iohexol (OMNIPAQUE) 350 MG/ML injection 75 mL (75 mLs Intravenous Contrast Given 12/15/22 1531)    ED Course/ Medical Decision Making/ A&P Clinical Course as of 12/15/22 1703  Thu Dec 15, 2022  1538 Hx AF on Eliquis. Syncopal fall. Getting CTA. Recurrent syncope. Mild AKI. Will need to be admitted for recurrent syncope once CTA returns [[facility]]    Clinical Course User Index [[facility]] Dyanne Iha, MD                                Medical Decision Making Problems Addressed: Syncope, unspecified syncope type: complicated acute illness or injury  Amount and/or Complexity of Data Reviewed Independent Historian: caregiver External Data Reviewed: labs and notes. Labs: ordered. Radiology: ordered and  independent interpretation performed. ECG/medicine tests: ordered and independent interpretation performed.  Risk Prescription drug management. Decision regarding hospitalization.    Patient is a 77 year old male with past medical history of HTN, OSA, atrial flutter, atrial fibrillation, presented today after an episode of lightheadedness and altered mental status.  On exam, patient is alert and oriented.  He is in rate controlled afib. Lungs are CTA. Neurologic exam is nonfocal.  Presentation concerning for dehydration, electrolyte derangement, vertigo, anemia, arrhythmia, ACS. Also considered possible CVA given persistent dizziness and vertigo.  EKG reveals A-fib at 66 bpm without any acute ST or T wave abnormalities.  Lab work reveals mild hyponatremia 128 and increased creatinine of 1.38.  He has a mild leukocytosis at 11.7.  Patient started on 1 L IV fluids.  CTA head and neck revealed no acute intracranial abnormality and no large vessel occlusion.  He does have mild atherosclerotic changes of bilateral carotid bifurcations  Will plan for admission for high risk syncope given likely 2 syncopal episodes in the last 1 week now with persistent dizziness and lightheadedness.   Final Clinical Impression(s) / ED Diagnoses Final diagnoses:  Syncope, unspecified syncope type    Rx / DC Orders ED Discharge Orders     None         Rhys Martini, DO 12/15/22 1703    Blane Ohara, MD 12/18/22 1521

## 2022-12-15 NOTE — Progress Notes (Signed)
FMTS Brief Progress Note  S: Saw patient during night rounds.  On arrival patient was laying comfortably in bed awake watching TV.  He said he is doing fine currently but reported that he has had 2 syncope episodes within the last 2 weeks.  The first episode was a week ago while he passed out while in the kitchen.  For this event patient said he did not have any aura or prodromal symptoms and for the second episode which occurred today while in the parking lot patient said he was having hot and sweaty then vision seems to slowly dim before passing out.  During the syncope episode he could hear his wife but feels like she was distant.  There was no shakiness during the episode and no postictal state. He report that he noticed systolic blood pressures dropped to the 80s although usually they are in the 130s.  Of note patient's stated that he had a heart ablation about 4-5 weeks ago no recent change in medication.   O: BP 139/68 (BP Location: Left Arm)   Pulse (!) 54   Temp 97.9 F (36.6 C) (Oral)   Resp 18   Ht 6' (1.829 m)   Wt 86.2 kg   SpO2 97%   BMI 25.77 kg/m   General: Awake, well appearing, NAD CV: RRR, no murmurs, normal S1/S2 Pulm: CTAB, good WOB on RA, no crackles or wheezing Abd: Soft, no distension, no tenderness Ext: No BLE edema   A/P: 77 year old male with multiple syncope episodes in the last 2 weeks.  Unclear cause at this time suspect his symptoms might be more consistent with dysautonomia.  However we will continue workup to explore cardiac etiologies. -Follow-up MRI and carotid ultrasound - Orders reviewed. Labs for AM ordered, which was adjusted as needed.  -Continue plan per day team  Jerre Simon, MD 12/15/2022, 11:02 PM PGY-3, [facility] Night Resident  Please page [phone] with questions.

## 2022-12-15 NOTE — Assessment & Plan Note (Addendum)
Differential includes cardiac syncope vs seizures vs orthostatic hypotension vs medication withdrawal Troponins, BNP WNL. TSH, B12 WNL. Clinical picture does not appear consistent with seizure.  - Admit to FPTS, Med Tele, Attending Dr. Deirdre Priest - Cardiology to see today - Continue A-fib medications as below - Follow-up echo - Cardiac monitoring -- orthostatic blood pressure measurement today - Will continue Lyrica and Ambien given chronic medications and want to avoid withdrawal -AM CBC and BMP

## 2022-12-15 NOTE — ED Notes (Signed)
ED TO INPATIENT HANDOFF REPORT  ED Nurse Name and Phone #: Arcelia Jew [phone]  S Name/Age/Gender Evan Robinson 77 y.o. male Room/Bed: 040C/040C  Code Status   Code Status: DNR  Home/SNF/Other Home Patient oriented to: self, place, time, and situation Is this baseline? Yes   Triage Complete: Triage complete  Chief Complaint Syncope [R55]  Triage Note Pt came to ED, pt was found unresponsive in car by wife and confused. Wife states its not his first time being confused like this. Hx of vertigo. VSS. Axox4. Pt on elliquis for afib    Allergies Allergies  Allergen Reactions   Fenofibrate Other (See Comments)    myalgias    Level of Care/Admitting Diagnosis ED Disposition     ED Disposition  Admit   Condition  --   Comment  Hospital Area: [facility] [[id number]]  Level of Care: Telemetry Medical [[id number]]  May admit patient to [facility] or [facility] if equivalent level of care is available:: No  Covid Evaluation: Asymptomatic - no recent exposure (last 10 days) testing not required  Diagnosis: Syncope [206001]  Admitting Physician: Lorayne Bender [[id number]]  Attending Physician: Acquanetta Belling D [[id number]]  Certification:: I certify this patient will need inpatient services for at least 2 midnights  Expected Medical Readiness: 12/17/2022          B Medical/Surgery History Past Medical History:  Diagnosis Date   Dyslipidemia    H/O prostate cancer 05/02/2001   s/p radical prostatectomy (uro Dr. Rayburn Ma in [place] yearly)   Headache 05/02/2010   thorough w/u - unrevealing.  has seen 3 neurologists, MRI, LP nonacute.  ESR = 3   HTN (hypertension)    Hypogonadism male    Low testosterone    Migraines    OSA on CPAP    Persistent atrial fibrillation (HCC) 06/28/2022   Renal cyst 12/01/2010   complex cyst upper pole L kidney with no malignancy by MRI   Vertigo    Vitamin D deficiency    Past Surgical History:  Procedure Laterality Date   ATRIAL  FIBRILLATION ABLATION N/A 07/30/2021   Procedure: ATRIAL FIBRILLATION ABLATION;  Surgeon: Lanier Prude, MD;  Location: [facility];  Service: Cardiovascular;  Laterality: N/A;   ATRIAL FIBRILLATION ABLATION N/A 10/25/2022   Procedure: ATRIAL FIBRILLATION ABLATION;  Surgeon: Lanier Prude, MD;  Location: [facility];  Service: Cardiovascular;  Laterality: N/A;   CHOLECYSTECTOMY  2006   COLONOSCOPY WITH PROPOFOL N/A 02/01/2017   Procedure: COLONOSCOPY WITH PROPOFOL;  Surgeon: Toledo, Boykin Nearing, MD;  Location: [facility] ENDOSCOPY;  Service: Gastroenterology;  Laterality: N/A;   ESOPHAGOGASTRODUODENOSCOPY (EGD) WITH PROPOFOL N/A 02/01/2017   Procedure: ESOPHAGOGASTRODUODENOSCOPY (EGD) WITH PROPOFOL;  Surgeon: Toledo, Boykin Nearing, MD;  Location: [facility] ENDOSCOPY;  Service: Gastroenterology;  Laterality: N/A;   HERNIA REPAIR  08/2010   PROSTATECTOMY  2003     A IV Location/Drains/Wounds Patient Lines/Drains/Airways Status     Active Line/Drains/Airways     Name Placement date Placement time Site Days   Peripheral IV 12/15/22 18 G Left Antecubital 12/15/22  1440  Antecubital  less than 1            Intake/Output Last 24 hours No intake or output data in the 24 hours ending 12/15/22 1946  Labs/Imaging Results for orders placed or performed during the hospital encounter of 12/15/22 (from the past 48 hour(s))  CBC with Differential     Status: Abnormal   Collection Time: 12/15/22  2:22 PM  Result Value Ref Range   WBC 11.7 (H) 4.0 - 10.5 K/uL   RBC 4.70 4.22 - 5.81 MIL/uL   Hemoglobin 14.4 13.0 - 17.0 g/dL   HCT 16.1 09.6 - 04.5 %   MCV 87.4 80.0 - 100.0 fL   MCH 30.6 26.0 - 34.0 pg   MCHC 35.0 30.0 - 36.0 g/dL   RDW 40.9 81.1 - 91.4 %   Platelets 191 150 - 400 K/uL   nRBC 0.0 0.0 - 0.2 %   Neutrophils Relative % 85 %   Neutro Abs 9.9 (H) 1.7 - 7.7 K/uL   Lymphocytes Relative 7 %   Lymphs Abs 0.8 0.7 - 4.0 K/uL   Monocytes Relative 7 %   Monocytes Absolute 0.8 0.1  - 1.0 K/uL   Eosinophils Relative 1 %   Eosinophils Absolute 0.1 0.0 - 0.5 K/uL   Basophils Relative 0 %   Basophils Absolute 0.0 0.0 - 0.1 K/uL   Immature Granulocytes 0 %   Abs Immature Granulocytes 0.05 0.00 - 0.07 K/uL    Comment: Performed at [facility], [address] [place], [place] [address]  Comprehensive metabolic panel     Status: Abnormal   Collection Time: 12/15/22  2:22 PM  Result Value Ref Range   Sodium 128 (L) 135 - 145 mmol/L   Potassium 3.6 3.5 - 5.1 mmol/L   Chloride 93 (L) 98 - 111 mmol/L   CO2 24 22 - 32 mmol/L   Glucose, Bld 141 (H) 70 - 99 mg/dL    Comment: Glucose reference range applies only to samples taken after fasting for at least 8 hours.   BUN 15 8 - 23 mg/dL   Creatinine, Ser 6.21 (H) 0.61 - 1.24 mg/dL   Calcium 8.9 8.9 - 30.8 mg/dL   Total Protein 6.7 6.5 - 8.1 g/dL   Albumin 4.3 3.5 - 5.0 g/dL   AST 23 15 - 41 U/L   ALT 15 0 - 44 U/L   Alkaline Phosphatase 75 38 - 126 U/L   Total Bilirubin 0.9 0.3 - 1.2 mg/dL   GFR, Estimated 53 (L) >60 mL/min    Comment: (NOTE) Calculated using the CKD-EPI Creatinine Equation (2021)    Anion gap 11 5 - 15    Comment: Performed at [facility], [address] [place], [place] [address]  Magnesium     Status: None   Collection Time: 12/15/22  2:22 PM  Result Value Ref Range   Magnesium 1.7 1.7 - 2.4 mg/dL    Comment: Performed at [facility], [address], [place], [place] [address]  Troponin I (High Sensitivity)     Status: None   Collection Time: 12/15/22  2:22 PM  Result Value Ref Range   Troponin I (High Sensitivity) 4 <18 ng/L    Comment: (NOTE) Elevated high sensitivity troponin I (hsTnI) values and significant  changes across serial measurements may suggest ACS but many other  chronic and acute conditions are known to elevate hsTnI results.  Refer to the "Links" section for chest pain algorithms and additional  guidance. Performed at [facility], [address] [place], [place] [address]   Urinalysis, w/ Reflex to Culture (Infection Suspected) -Urine, Clean Catch     Status: None   Collection Time: 12/15/22  2:23 PM  Result Value Ref Range   Specimen Source URINE, CLEAN CATCH    Color, Urine YELLOW YELLOW   APPearance CLEAR  CLEAR   Specific Gravity, Urine 1.014 1.005 - 1.030   pH 7.0 5.0 - 8.0   Glucose, UA NEGATIVE NEGATIVE mg/dL   Hgb urine dipstick NEGATIVE NEGATIVE   Bilirubin Urine NEGATIVE NEGATIVE   Ketones, ur NEGATIVE NEGATIVE mg/dL   Protein, ur NEGATIVE NEGATIVE mg/dL   Nitrite NEGATIVE NEGATIVE   Leukocytes,Ua NEGATIVE NEGATIVE   RBC / HPF 0-5 0 - 5 RBC/hpf   WBC, UA 0-5 0 - 5 WBC/hpf    Comment:        Reflex urine culture not performed if WBC <=10, OR if Squamous epithelial cells >5. If Squamous epithelial cells >5 suggest recollection.    Bacteria, UA NONE SEEN NONE SEEN   Squamous Epithelial / HPF 0-5 0 - 5 /HPF   Mucus PRESENT     Comment: Performed at [facility], [address] [place], [place] [address]  I-stat chem 8, ED (not at [facility], [facility] or [facility])     Status: Abnormal   Collection Time: 12/15/22  2:58 PM  Result Value Ref Range   Sodium 130 (L) 135 - 145 mmol/L   Potassium 3.6 3.5 - 5.1 mmol/L   Chloride 93 (L) 98 - 111 mmol/L   BUN 16 8 - 23 mg/dL   Creatinine, Ser 8.41 (H) 0.61 - 1.24 mg/dL   Glucose, Bld 660 (H) 70 - 99 mg/dL    Comment: Glucose reference range applies only to samples taken after fasting for at least 8 hours.   Calcium, Ion 1.11 (L) 1.15 - 1.40 mmol/L   TCO2 24 22 - 32 mmol/L   Hemoglobin 14.6 13.0 - 17.0 g/dL   HCT 63.0 16.0 - 10.9 %  Troponin I (High Sensitivity)     Status: None   Collection Time: 12/15/22  4:37 PM  Result Value Ref Range   Troponin I (High Sensitivity) 4 <18 ng/L    Comment: (NOTE) Elevated high sensitivity troponin I (hsTnI) values and significant  changes across serial measurements may suggest ACS but many other  chronic and acute conditions are known to  elevate hsTnI results.  Refer to the "Links" section for chest pain algorithms and additional  guidance. Performed at [facility], [address] [place], [place] [address]    DG Chest 2 View  Result Date: 12/15/2022 CLINICAL DATA:  Syncope EXAM: CHEST - 2 VIEW COMPARISON:  X-ray 03/21/2021 FINDINGS: Slight elevation of the right hemidiaphragm. No consolidation, pneumothorax or effusion. No edema. Normal cardiopericardial silhouette. Overlapping cardiac leads. Metallic device overlies the left upper thorax. Air-fluid level along the stomach beneath the left hemidiaphragm. IMPRESSION: No acute cardiopulmonary disease. Stable elevated right hemidiaphragm Electronically Signed   By: Karen Kays M.D.   On: 12/15/2022 17:06   CT ANGIO HEAD NECK W WO CM  Result Date: 12/15/2022 CLINICAL DATA:  Syncope/presyncope. Cerebrovascular cause suspected. EXAM: CT ANGIOGRAPHY HEAD AND NECK WITH AND WITHOUT CONTRAST TECHNIQUE: Multidetector CT imaging of the head and neck was performed using the standard protocol during bolus administration of intravenous contrast. Multiplanar CT image reconstructions and MIPs were obtained to evaluate the vascular anatomy. Carotid stenosis measurements (when applicable) are obtained utilizing NASCET criteria, using the distal internal carotid diameter as the denominator. RADIATION DOSE REDUCTION: This exam was performed according to the departmental dose-optimization program which includes automated exposure control, adjustment of the mA and/or kV according to patient size and/or use of iterative reconstruction technique. CONTRAST:  75mL OMNIPAQUE IOHEXOL 350 MG/ML SOLN COMPARISON:  CT and  MRI October 04, 2021. FINDINGS: CT HEAD FINDINGS Brain: No evidence of acute infarction, hemorrhage, hydrocephalus, extra-axial collection or mass lesion/mass effect. Mild parenchymal volume loss, more evident at the cerebellar vermis. Vascular: No hyperdense vessel or unexpected calcification.  Skull: Normal. Negative for fracture or focal lesion. Sinuses/Orbits: Mild mucosal thickening of the right maxillary sinus and ethmoid cells. The orbits are maintained. Other: None. Review of the MIP images confirms the above findings CTA NECK FINDINGS Aortic arch: Common origin of the innominate and left common carotid artery from the aortic arch. Imaged portion shows no evidence of aneurysm or dissection. Atherosclerotic disease with mixed density plaque measuring up to 4 mm. No significant stenosis of the major arch vessel origins. Right carotid system: Mild atherosclerotic disease of the right carotid bifurcation without hemodynamically significant stenosis. Left carotid system: Mild atherosclerotic disease of the left carotid bifurcation without hemodynamically significant stenosis. Vertebral arteries: Calcified atherosclerotic changes at the origin of the right subclavian artery without hemodynamically significant stenosis. No evidence of significant atherosclerotic disease or stenosis along the cervical course of the bilateral vertebral arteries. Skeleton: Diffuse heterogeneity of the visualized spine with lucencies and coarse trabeculated. Flowing syndesmophytes along the cervical spine and visualized upper thoracic spine. Other neck: Negative. Upper chest: Coronary calcified atherosclerotic disease. Review of the MIP images confirms the above findings CTA HEAD FINDINGS Anterior circulation: Mild atherosclerotic changes in the bilateral carotid siphons without significant stenosis. A 2-3 mm outpouching from the supraclinoid right ICA at the origin of the posterior communicating artery is favored to represent an infundibulum. No significant stenosis, proximal occlusion, aneurysm or vascular malformation. Posterior circulation: No significant stenosis, proximal occlusion, aneurysm, or vascular malformation. Venous sinuses: As permitted by contrast timing, patent. Anatomic variants: None significant. Review of  the MIP images confirms the above findings IMPRESSION: 1. No acute intracranial abnormality. 2. No intracranial large vessel occlusion or significant stenosis. 3. Mild atherosclerotic changes of the bilateral carotid bifurcations and carotid siphons without hemodynamically significant stenosis. Electronically Signed   By: Baldemar Lenis M.D.   On: 12/15/2022 16:29    Pending Labs Unresulted Labs (From admission, onward)     Start     Ordered   12/16/22 0500  Basic metabolic panel  Tomorrow morning,   R        12/15/22 1824   12/16/22 0500  CBC  Tomorrow morning,   R        12/15/22 1824   12/15/22 1824  TSH  Once,   R        12/15/22 1824   12/15/22 1824  Vitamin B12  Once,   R        12/15/22 1824   12/15/22 1423  Brain natriuretic peptide  Once,   URGENT        12/15/22 1424            Vitals/Pain Today's Vitals   12/15/22 1730 12/15/22 1745 12/15/22 1815 12/15/22 1845  BP: (!) 154/87 (!) 141/84 135/77 139/76  Pulse: 61 60 (!) 56 (!) 55  Resp: 18 16 15 15   Temp:      TempSrc:      SpO2: 100% 98% 100% 100%  Weight:      Height:      PainSc:        Isolation Precautions No active isolations  Medications Medications  diltiazem (CARDIZEM CD) 24 hr capsule 120 mg (has no administration in time range)  diltiazem (CARDIZEM) tablet 30 mg (has no administration  in time range)  flecainide (TAMBOCOR) tablet 50 mg (has no administration in time range)  hydrochlorothiazide (HYDRODIURIL) tablet 25 mg (has no administration in time range)  losartan (COZAAR) tablet 100 mg (has no administration in time range)  apixaban (ELIQUIS) tablet 5 mg (has no administration in time range)  baclofen (LIORESAL) tablet 5 mg (has no administration in time range)  pregabalin (LYRICA) capsule 100 mg (has no administration in time range)  zolpidem (AMBIEN) tablet 10 mg (has no administration in time range)  sodium chloride flush (NS) 0.9 % injection 3 mL (has no administration in  time range)  lactated ringers bolus 1,000 mL (0 mLs Intravenous Stopped 12/15/22 1639)  iohexol (OMNIPAQUE) 350 MG/ML injection 75 mL (75 mLs Intravenous Contrast Given 12/15/22 1531)    Mobility walks     Focused Assessments Neuro Assessment Handoff:  Swallow screen pass? Yes          Neuro Assessment:   Neuro Checks:      Has TPA been given? No If patient is a Neuro Trauma and patient is going to OR before floor call report to 4N Charge nurse: [phone] or [phone]   R Recommendations: See Admitting Provider Note  Report given to:   Additional Notes:

## 2022-12-16 ENCOUNTER — Inpatient Hospital Stay (HOSPITAL_BASED_OUTPATIENT_CLINIC_OR_DEPARTMENT_OTHER): Payer: Medicare PPO

## 2022-12-16 DIAGNOSIS — R55 Syncope and collapse: Secondary | ICD-10-CM

## 2022-12-16 DIAGNOSIS — E878 Other disorders of electrolyte and fluid balance, not elsewhere classified: Secondary | ICD-10-CM | POA: Insufficient documentation

## 2022-12-16 LAB — BASIC METABOLIC PANEL
Anion gap: 8 (ref 5–15)
BUN: 12 mg/dL (ref 8–23)
CO2: 27 mmol/L (ref 22–32)
Calcium: 8.6 mg/dL — ABNORMAL LOW (ref 8.9–10.3)
Chloride: 94 mmol/L — ABNORMAL LOW (ref 98–111)
Creatinine, Ser: 0.97 mg/dL (ref 0.61–1.24)
GFR, Estimated: >60 mL/min (ref >60)
Glucose, Bld: 106 mg/dL — ABNORMAL HIGH (ref 70–99)
Potassium: 3.4 mmol/L — ABNORMAL LOW (ref 3.5–5.1)
Sodium: 129 mmol/L — ABNORMAL LOW (ref 135–145)

## 2022-12-16 LAB — CBC
HCT: 36.4 % — ABNORMAL LOW (ref 39.0–52.0)
Hemoglobin: 12.7 g/dL — ABNORMAL LOW (ref 13.0–17.0)
MCH: 30.3 pg (ref 26.0–34.0)
MCHC: 34.9 g/dL (ref 30.0–36.0)
MCV: 86.9 fL (ref 80.0–100.0)
Platelets: 166 10*3/uL (ref 150–400)
RBC: 4.19 MIL/uL — ABNORMAL LOW (ref 4.22–5.81)
RDW: 12.8 % (ref 11.5–15.5)
WBC: 6.1 10*3/uL (ref 4.0–10.5)
nRBC: 0 % (ref 0.0–0.2)

## 2022-12-16 LAB — ECHOCARDIOGRAM COMPLETE
Area-P 1/2: 2.69 cm2
Height: 72 in
S' Lateral: 4.2 cm
Weight: 3040 [oz_av]

## 2022-12-16 MED ORDER — POTASSIUM CHLORIDE 20 MEQ PO PACK
20.0000 meq | PACK | Freq: Two times a day (BID) | ORAL | Status: AC
  Administered 2022-12-16 (×2): 20 meq via ORAL
  Filled 2022-12-16 (×2): qty 1

## 2022-12-16 MED ORDER — POTASSIUM CHLORIDE 20 MEQ PO PACK: 20.0000 meq | PACK | Freq: Two times a day (BID) | ORAL | Status: DC

## 2022-12-16 NOTE — Plan of Care (Signed)

## 2022-12-16 NOTE — Consult Note (Signed)
Cardiology Consultation   Patient ID: Evan Robinson MRN: [id number]; DOB: 1945-10-21  Admit date: 12/15/2022 Date of Consult: 12/16/2022  PCP:  Genice Rouge, MD   [facility] Providers Cardiologist:  None  Electrophysiologist:  Lanier Prude, MD       Patient Profile:   Evan Robinson is a 77 y.o. male with a hx of hypertension, atrial flutter, atrial fibrillation, OSA, status post atrial fibrillation ablation 07/2022 and repeat 10/2022 who is being seen 12/16/2022 for the evaluation of syncope at the request of Dr. McDiarmid.  History of Present Illness:   [name] presented with two recent episodes of syncope within the past two weeks. These episodes were the first of his kind in the patient's life. The patient had undergone two cardiac ablations, the most recent one being five weeks prior to the current presentation. The patient reported feeling out of focus and experiencing a darkening of vision before passing out. The first episode occurred at home after a morning walk and a shower, during which he fell and woke up on the floor. The patient's blood pressure was noted to be low at 82 systolic immediately after the episode, but it gradually returned to normal ranges throughout the day. The second episode occurred in a store, after which he was driven to the emergency room.  [name] also reported a severe headache the night before the second syncope episode, which persisted into the next day but was less severe. The patient has a history of headaches, which are usually resolved by sleep. The patient's blood pressure readings at home have generally been within the range of 125 to 140 systolic. The patient reported a slight weight loss but denied any significant changes in diet. The patient also reported no lightheadedness or dizziness upon standing, no swelling in the legs or feet, and no chest pain or shortness of breath.  [name] has been on losartan, diltiazem, and  a diuretic for blood pressure control. The patient reported that the diltiazem was added after the onset of atrial fibrillation about a year and a half ago. He was wearing a Zio monitor at the time of the second syncope episode, which was expected to provide more information about the cause of the episodes.   Past Medical History:  Diagnosis Date   Dyslipidemia    H/O prostate cancer 05/02/2001   s/p radical prostatectomy (uro Dr. Rayburn Ma in [place] yearly)   Headache 05/02/2010   thorough w/u - unrevealing.  has seen 3 neurologists, MRI, LP nonacute.  ESR = 3   HTN (hypertension)    Hypogonadism male    Low testosterone    Migraines    OSA on CPAP    Persistent atrial fibrillation (HCC) 06/28/2022   Renal cyst 12/01/2010   complex cyst upper pole L kidney with no malignancy by MRI   Vertigo    Vitamin D deficiency     Past Surgical History:  Procedure Laterality Date   ATRIAL FIBRILLATION ABLATION N/A 07/30/2021   Procedure: ATRIAL FIBRILLATION ABLATION;  Surgeon: Lanier Prude, MD;  Location: [facility];  Service: Cardiovascular;  Laterality: N/A;   ATRIAL FIBRILLATION ABLATION N/A 10/25/2022   Procedure: ATRIAL FIBRILLATION ABLATION;  Surgeon: Lanier Prude, MD;  Location: [facility];  Service: Cardiovascular;  Laterality: N/A;   CHOLECYSTECTOMY  2006   COLONOSCOPY WITH PROPOFOL N/A 02/01/2017   Procedure: COLONOSCOPY WITH PROPOFOL;  Surgeon: Toledo, Boykin Nearing, MD;  Location: [facility] ENDOSCOPY;  Service: Gastroenterology;  Laterality: N/A;   ESOPHAGOGASTRODUODENOSCOPY (EGD) WITH PROPOFOL N/A 02/01/2017   Procedure: ESOPHAGOGASTRODUODENOSCOPY (EGD) WITH PROPOFOL;  Surgeon: Toledo, Boykin Nearing, MD;  Location: [facility] ENDOSCOPY;  Service: Gastroenterology;  Laterality: N/A;   HERNIA REPAIR  08/2010   PROSTATECTOMY  2003     Home Medications:  Prior to Admission medications   Medication Sig Start Date End Date Taking? Authorizing Provider  acetaminophen (TYLENOL)  500 MG tablet Take 1,000 mg by mouth daily as needed for headache or moderate pain.   Yes [provider]  ALPRAZolam Prudy Feeler) 0.5 MG tablet Take 0.25 mg by mouth 2 (two) times daily as needed for anxiety. 09/22/20  Yes [provider]  Ascorbic Acid (VITAMIN C PO) Take 1 tablet by mouth daily.   Yes [provider]  b complex vitamins capsule Take 1 capsule by mouth daily.   Yes [provider]  Carboxymethylcellul-Glycerin (CLEAR EYES FOR DRY EYES OP) Place 1 drop into both eyes 3 (three) times daily as needed (dryness, irritation).   Yes [provider]  diltiazem (CARDIZEM CD) 120 MG 24 hr capsule TAKE 1 CAPSULE BY MOUTH DAILY 11/07/22  Yes Lanier Prude, MD  diltiazem (CARDIZEM) 30 MG tablet Take 1 Tablet Every 4 Hours As Needed For HR >100 and Top BP>100 03/24/21  Yes Newman Nip, NP  ELIQUIS 5 MG TABS tablet TAKE 1 TABLET(5 MG) BY MOUTH TWICE DAILY 11/07/22  Yes Lanier Prude, MD  flecainide (TAMBOCOR) 50 MG tablet Take 1 tablet (50 mg total) by mouth 2 (two) times daily. 03/31/22  Yes Sheilah Pigeon, PA-C  gemfibrozil (LOPID) 600 MG tablet Take 600 mg by mouth daily.   Yes [provider]  hydrochlorothiazide (HYDRODIURIL) 25 MG tablet TAKE 1 TABLET BY MOUTH DAILY 05/18/22  Yes Lanier Prude, MD  HYDROcodone-acetaminophen (NORCO/VICODIN) 5-325 MG per tablet Take 0.5-1 tablets by mouth every 6 (six) hours as needed (severe headache).   Yes [provider]  losartan (COZAAR) 25 MG tablet TAKE 1 TABLET(25 MG) BY MOUTH EVERY DAY 05/16/22  Yes Lanier Prude, MD  meclizine (ANTIVERT) 25 MG tablet Take 1 tablet (25 mg total) by mouth 3 (three) times daily as needed for dizziness. Patient taking differently: Take 12.5-25 mg by mouth 3 (three) times daily as needed for dizziness. 10/04/21  Yes Tegeler, Canary Brim, MD  pregabalin (LYRICA) 100 MG capsule Take 100 mg by mouth 3 (three) times daily. 10/14/22  Yes [provider]  SUMAtriptan (IMITREX) 50 MG tablet Take 1 tablet (50 mg total) by mouth once as needed for migraine. May repeat in 2 hours if headache persists or recurs. 10/15/21 10/20/24 Yes Raulkar, Drema Pry, MD  zolpidem (AMBIEN) 10 MG tablet Take 10 mg by mouth at bedtime. 12/27/21  Yes [provider]    Inpatient Medications: Scheduled Meds:  apixaban  5 mg Oral BID   diltiazem  120 mg Oral Daily   flecainide  50 mg Oral BID   hydrochlorothiazide  25 mg Oral Daily   losartan  100 mg Oral Daily   potassium chloride  20 mEq Oral BID   pregabalin  100 mg Oral TID   sodium chloride flush  3 mL Intravenous Q12H   zolpidem  10 mg Oral QHS   Continuous Infusions:  PRN Meds: acetaminophen, baclofen, diltiazem, SUMAtriptan  Allergies:    Allergies  Allergen Reactions   Asa [Aspirin] Other (See Comments)    Avoids due to Eliquis.  Nsaids Other (See Comments)    Avoids due to Eliquis.   Tricor [Fenofibrate] Other (See Comments)    Myalgias     Social History:   Social History   Socioeconomic History   Marital status: Married    Spouse name: Not on file   Number of children: Not on file   Years of education: Not on file   Highest education level: Not on file  Occupational History   Not on file  Tobacco Use   Smoking status: Never   Smokeless tobacco: Never  Vaping Use   Vaping status: Never Used  Substance and Sexual Activity   Alcohol use: No   Drug use: No   Sexual activity: Not on file  Other Topics Concern   Not on file  Social History Narrative   "Gene"   Lives with wife   Occupation: retired, was Dietitian   Activity: walks daily, some gym   Diet:    Social Determinants of Health   Financial Resource Strain: Not on file  Food Insecurity: No Food Insecurity (12/15/2022)   Hunger Vital Sign    Worried About Running Out of Food in the Last Year: Never true    Ran Out of Food in the Last Year: Never true  Transportation Needs: No  Transportation Needs (12/15/2022)   PRAPARE - Administrator, Civil Service (Medical): No    Lack of Transportation (Non-Medical): No  Physical Activity: Not on file  Stress: Not on file  Social Connections: Not on file  Intimate Partner Violence: Not At Risk (12/15/2022)   Humiliation, Afraid, Rape, and Kick questionnaire    Fear of Current or Ex-Partner: No    Emotionally Abused: No    Physically Abused: No    Sexually Abused: No    Family History:    Family History  Problem Relation Age of Onset   Alcohol abuse Father    Cancer Father        prostate   CAD Father        possible MI   Alcohol abuse Paternal Grandfather    Stroke Mother    Diabetes Neg Hx      ROS:  Please see the history of present illness.   All other ROS reviewed and negative.     Physical Exam/Data:   Vitals:   12/16/22 0006 12/16/22 0453 12/16/22 0807 12/16/22 1646  BP: 134/69 116/69 (!) 147/73 (!) 168/88  Pulse: (!) 53 (!) 57 (!) 55 62  Resp: 18 18 18 18   Temp: (!) 97.3 F (36.3 C) (!) 97.5 F (36.4 C) 97.6 F (36.4 C) 98 F (36.7 C)  TempSrc: Oral Oral Oral Oral  SpO2: 96% 98% 100% 99%  Weight:      Height:       No intake or output data in the 24 hours ending 12/16/22 1751    12/15/2022    2:31 PM 12/07/2022    2:19 PM 11/22/2022    9:21 AM  Last 3 Weights  Weight (lbs) 190 lb 190 lb 10.1 oz 189 lb 6.4 oz  Weight (kg) 86.183 kg 86.469 kg 85.911 kg     VS:  BP (!) 168/88 (BP Location: Right Arm)   Pulse 62   Temp 98 F (36.7 C) (Oral)   Resp 18   Ht 6' (1.829 m)   Wt 86.2 kg   SpO2 99%   BMI 25.77 kg/m  , BMI Body mass index is 25.77 kg/m.  GENERAL:  Well appearing HEENT: Pupils equal round and reactive, fundi not visualized, oral mucosa unremarkable NECK:  No jugular venous distention, waveform within normal limits, carotid upstroke brisk and symmetric, no bruits, no thyromegaly LUNGS:  Clear to auscultation bilaterally HEART:  RRR.  PMI not displaced or  sustained,S1 and S2 within normal limits, no S3, no S4, no clicks, no rubs, II/VI systolic murmur at apex ABD:  Flat, positive bowel sounds normal in frequency in pitch, no bruits, no rebound, no guarding, no midline pulsatile mass, no hepatomegaly, no splenomegaly EXT:  2 plus pulses throughout, no edema, no cyanosis no clubbing SKIN:  No rashes no nodules NEURO:  Cranial nerves II through XII grossly intact, motor grossly intact throughout PSYCH:  Cognitively intact, oriented to person place and time   EKG:  The EKG was personally reviewed and demonstrates:  Sinus arrhythmia.  Rate 66 bpm.  LAD. Telemetry:  Telemetry was personally reviewed and demonstrates:  Sinus arrhythmia.  Sinus bradycardia.  Rate 50-70s.   Relevant CV Studies:  Echo 12/16/22: IMPRESSIONS     1. LVEF is unchanged compared to images from study donw in 2023 . Left  ventricular ejection fraction, by estimation, is 45 to 50%. The left  ventricle has mildly decreased function. The left ventricular internal  cavity size was severely dilated. Left  ventricular diastolic parameters were normal.   2. Right ventricular systolic function is mildly reduced. The right  ventricular size is mildly enlarged.   3. Mild mitral valve regurgitation. 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   4. The aortic valve is tricuspid. Aortic valve regurgitation is not  visualized. Aortic valve sclerosis/calcification is present, without any  evidence of aortic stenosis.  Laboratory Data:  High Sensitivity Troponin:   Recent Labs  Lab 12/15/22 1422 12/15/22 1637  TROPONINIHS 4 4     Chemistry Recent Labs  Lab 12/15/22 1422 12/15/22 1458 12/16/22 0601  NA 128* 130* 129*  K 3.6 3.6 3.4*  CL 93* 93* 94*  CO2 24  --  27  GLUCOSE 141* 140* 106*  BUN 15 16 12   CREATININE 1.38* 1.40* 0.97  CALCIUM 8.9  --  8.6*  MG 1.7  --   --   GFRNONAA 53*  --  >60  ANIONGAP 11  --  8    Recent Labs  Lab  12/15/22 1422  PROT 6.7  ALBUMIN 4.3  AST 23  ALT 15  ALKPHOS 75  BILITOT 0.9   Lipids No results for input(s): "CHOL", "TRIG", "HDL", "LABVLDL", "LDLCALC", "CHOLHDL" in the last 168 hours.  Hematology Recent Labs  Lab 12/15/22 1422 12/15/22 1458 12/16/22 0601  WBC 11.7*  --  6.1  RBC 4.70  --  4.19*  HGB 14.4 14.6 12.7*  HCT 41.1 43.0 36.4*  MCV 87.4  --  86.9  MCH 30.6  --  30.3  MCHC 35.0  --  34.9  RDW 12.7  --  12.8  PLT 191  --  166   Thyroid  Recent Labs  Lab 12/15/22 1846  TSH 0.840    BNP Recent Labs  Lab 12/15/22 2034  BNP 57.3    DDimer No results for input(s): "DDIMER" in the last 168 hours.   Radiology/Studies:  ECHOCARDIOGRAM COMPLETE  Result Date: 12/16/2022    ECHOCARDIOGRAM REPORT   Patient Name:   Johanthan Robinson Date of Exam: 12/16/2022 Medical Rec #:  [id number]     Height:  72.0 in Accession #:    [id number]    Weight:       190.0 lb Date of Birth:  November 19, 1945     BSA:          2.085 m Patient Age:    76 years      BP:           147/73 mmHg Patient Gender: M             HR:           63 bpm. Exam Location:  Inpatient Procedure: 2D Echo, Cardiac Doppler and Color Doppler Indications:    Syncope R55  History:        Patient has prior history of Echocardiogram examinations, most                 recent 10/25/2022.  Sonographer:    Harriette Bouillon RDCS Referring Phys: [phone] WHITNEY PLUNKETT IMPRESSIONS  1. LVEF is unchanged compared to images from study donw in 2023 . Left ventricular ejection fraction, by estimation, is 45 to 50%. The left ventricle has mildly decreased function. The left ventricular internal cavity size was severely dilated. Left ventricular diastolic parameters were normal.  2. Right ventricular systolic function is mildly reduced. The right ventricular size is mildly enlarged.  3. Mild mitral valve regurgitation. 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  4. The aortic valve is tricuspid. Aortic valve regurgitation  is not visualized. Aortic valve sclerosis/calcification is present, without any evidence of aortic stenosis. FINDINGS  Left Ventricle: LVEF is unchanged compared to images from study donw in 2023. Left ventricular ejection fraction, by estimation, is 45 to 50%. The left ventricle has mildly decreased function. The left ventricular internal cavity size was severely dilated. There is no left ventricular hypertrophy. Left ventricular diastolic parameters were normal. Right Ventricle: The right ventricular size is mildly enlarged. Right vetricular wall thickness was not assessed. Right ventricular systolic function is mildly reduced. Left Atrium: Left atrial size was normal in size. Right Atrium: Right atrial size was normal in size. Pericardium: There is no evidence of pericardial effusion. Mitral Valve: There is moderate prolapse of both leaflets of the mitral valve. There is mild thickening of the mitral valve leaflet(s). Mild mitral valve regurgitation. Tricuspid Valve: The tricuspid valve is normal in structure. Tricuspid valve regurgitation is mild. Aortic Valve: The aortic valve is tricuspid. Aortic valve regurgitation is not visualized. Aortic valve sclerosis/calcification is present, without any evidence of aortic stenosis. Pulmonic Valve: The pulmonic valve was normal in structure. Pulmonic valve regurgitation is trivial. Aorta: The aortic root and ascending aorta are structurally normal, with no evidence of dilitation. IAS/Shunts: No atrial level shunt detected by color flow Doppler.  LEFT VENTRICLE PLAX 2D LVIDd:         6.40 cm   Diastology LVIDs:         4.20 cm   LV e' medial:    6.74 cm/s LV PW:         0.80 cm   LV E/e' medial:  9.4 LV IVS:        0.70 cm   LV e' lateral:   10.20 cm/s LVOT diam:     2.10 cm   LV E/e' lateral: 6.2 LV SV:         58 LV SV Index:   28 LVOT Area:     3.46 cm  RIGHT VENTRICLE TAPSE (M-mode): 3.6 cm LEFT ATRIUM  Index        RIGHT ATRIUM           Index LA diam:         4.10 cm 1.97 cm/m   RA Area:     15.20 cm LA Vol (A2C):   74.5 ml 35.74 ml/m  RA Volume:   38.40 ml  18.42 ml/m LA Vol (A4C):   47.5 ml 22.79 ml/m LA Biplane Vol: 62.6 ml 30.03 ml/m  AORTIC VALVE             PULMONIC VALVE LVOT Vmax:   80.60 cm/s  PR End Diast Vel: 5.11 msec LVOT Vmean:  51.800 cm/s LVOT VTI:    0.167 m  AORTA Ao Root diam: 4.00 cm Ao Asc diam:  3.50 cm MITRAL VALVE               TRICUSPID VALVE MV Area (PHT): 2.69 cm    TR Peak grad:   38.7 mmHg MV Decel Time: 282 msec    TR Vmax:        311.00 cm/s MV E velocity: 63.30 cm/s MV A velocity: 48.70 cm/s  SHUNTS MV E/A ratio:  1.30        Systemic VTI:  0.17 m                            Systemic Diam: 2.10 cm [name] MD Electronically signed by [name] MD Signature Date/Time: 12/16/2022/12:45:22 PM    Final    DG Chest 2 View  Result Date: 12/15/2022 CLINICAL DATA:  Syncope EXAM: CHEST - 2 VIEW COMPARISON:  X-ray 03/21/2021 FINDINGS: Slight elevation of the right hemidiaphragm. No consolidation, pneumothorax or effusion. No edema. Normal cardiopericardial silhouette. Overlapping cardiac leads. Metallic device overlies the left upper thorax. Air-fluid level along the stomach beneath the left hemidiaphragm. IMPRESSION: No acute cardiopulmonary disease. Stable elevated right hemidiaphragm Electronically Signed   By: Karen Kays M.D.   On: 12/15/2022 17:06   CT ANGIO HEAD NECK W WO CM  Result Date: 12/15/2022 CLINICAL DATA:  Syncope/presyncope. Cerebrovascular cause suspected. EXAM: CT ANGIOGRAPHY HEAD AND NECK WITH AND WITHOUT CONTRAST TECHNIQUE: Multidetector CT imaging of the head and neck was performed using the standard protocol during bolus administration of intravenous contrast. Multiplanar CT image reconstructions and MIPs were obtained to evaluate the vascular anatomy. Carotid stenosis measurements (when applicable) are obtained utilizing NASCET criteria, using the distal internal carotid diameter as the denominator.  RADIATION DOSE REDUCTION: This exam was performed according to the departmental dose-optimization program which includes automated exposure control, adjustment of the mA and/or kV according to patient size and/or use of iterative reconstruction technique. CONTRAST:  75mL OMNIPAQUE IOHEXOL 350 MG/ML SOLN COMPARISON:  CT and MRI October 04, 2021. FINDINGS: CT HEAD FINDINGS Brain: No evidence of acute infarction, hemorrhage, hydrocephalus, extra-axial collection or mass lesion/mass effect. Mild parenchymal volume loss, more evident at the cerebellar vermis. Vascular: No hyperdense vessel or unexpected calcification. Skull: Normal. Negative for fracture or focal lesion. Sinuses/Orbits: Mild mucosal thickening of the right maxillary sinus and ethmoid cells. The orbits are maintained. Other: None. Review of the MIP images confirms the above findings CTA NECK FINDINGS Aortic arch: Common origin of the innominate and left common carotid artery from the aortic arch. Imaged portion shows no evidence of aneurysm or dissection. Atherosclerotic disease with mixed density plaque measuring up to 4 mm. No significant stenosis of the major arch vessel origins. Right  carotid system: Mild atherosclerotic disease of the right carotid bifurcation without hemodynamically significant stenosis. Left carotid system: Mild atherosclerotic disease of the left carotid bifurcation without hemodynamically significant stenosis. Vertebral arteries: Calcified atherosclerotic changes at the origin of the right subclavian artery without hemodynamically significant stenosis. No evidence of significant atherosclerotic disease or stenosis along the cervical course of the bilateral vertebral arteries. Skeleton: Diffuse heterogeneity of the visualized spine with lucencies and coarse trabeculated. Flowing syndesmophytes along the cervical spine and visualized upper thoracic spine. Other neck: Negative. Upper chest: Coronary calcified atherosclerotic disease.  Review of the MIP images confirms the above findings CTA HEAD FINDINGS Anterior circulation: Mild atherosclerotic changes in the bilateral carotid siphons without significant stenosis. A 2-3 mm outpouching from the supraclinoid right ICA at the origin of the posterior communicating artery is favored to represent an infundibulum. No significant stenosis, proximal occlusion, aneurysm or vascular malformation. Posterior circulation: No significant stenosis, proximal occlusion, aneurysm, or vascular malformation. Venous sinuses: As permitted by contrast timing, patent. Anatomic variants: None significant. Review of the MIP images confirms the above findings IMPRESSION: 1. No acute intracranial abnormality. 2. No intracranial large vessel occlusion or significant stenosis. 3. Mild atherosclerotic changes of the bilateral carotid bifurcations and carotid siphons without hemodynamically significant stenosis. Electronically Signed   By: Baldemar Lenis M.D.   On: 12/15/2022 16:29     Assessment and Plan:   # Syncope Two episodes of syncope in the past two weeks, first time in patient's life. Recent history of two cardiac ablations, the most recent one five weeks ago. Patient was wearing a cardiac monitor during the second episode. Blood pressure dropped to systolic of 82 during the first episode. No orthostatic hypotension. No associated chest pain or palpitations. -Interrogate cardiac monitor to rule in/out arrhythmia as cause of syncope.  Unfortunately this requires mailing it back to [place], which his wife will do today. -Carotid Doppler ordered due to recent syncope episodes. -Consider holding Hydrochlorothiazide to prevent hypotension. -Advise patient to wear compression socks and hydrate well. -Check for pheochromocytoma with lab work due to labile blood pressure. -Will follow up with me for general cardiology  # Hypertension Blood pressure readings at home typically 125-140 systolic. Recent  reading of 168 systolic in hospital. Patient on Losartan, Hydrochlorothiazide, and Diltiazem. -Continue Losartan and Diltiazem, hold Hydrochlorothiazide. -Check blood pressure regularly.  # Persistent atrial fibrillation:  Maintaining sinus rhythm on flecainide.  Now s/p   Follow-up Plan to discharge patient tomorrow if blood pressure remains stable and there are no concerning findings on cardiac monitor.  Risk Assessment/Risk Scores:          CHA2DS2-VASc Score = 4   This indicates a 4.8% annual risk of stroke. The patient's score is based upon: CHF History: 0 HTN History: 1 Diabetes History: 0 Stroke History: 0 Vascular Disease History: 1 Age Score: 2 Gender Score: 0         For questions or updates, please contact [facility] Please consult [email or web address] for contact info under    Signed, Chilton Si, MD  12/16/2022 5:51 PM

## 2022-12-16 NOTE — Progress Notes (Signed)
Transition of Care [facility]) - Inpatient Brief Assessment   Patient Details  Name: Evan Robinson MRN: [id number] Date of Birth: July 24, 1945  Transition of Care [facility]) CM/SW Contact:    Janae Bridgeman, RN Phone Number: 12/16/2022, 3:16 PM   Clinical Narrative: Transition of Care [facility]) - Inpatient Brief Assessment   Patient Details  Name: Evan Robinson MRN: [id number] Date of Birth: January 26, 1946  Transition of Care [facility]) CM/SW Contact:    Janae Bridgeman, RN Phone Number: 12/16/2022, 3:16 PM   Clinical Narrative: Patient admitted to the hospital with syncopal episode.  No TOC needs at this time.   Transition of Care Asessment: Insurance and Status: (P) Insurance coverage has been reviewed Patient has primary care physician: (P) Yes Home environment has been reviewed: (P) Yes - home with spouse Prior level of function:: (P) Independent Prior/Current Home Services: (P) No current home services Social Determinants of Health Reivew: (P) SDOH reviewed no interventions necessary Readmission risk has been reviewed: (P) Yes Transition of care needs: (P) no transition of care needs at this time    Transition of Care Asessment: Insurance and Status: (P) Insurance coverage has been reviewed Patient has primary care physician: (P) Yes Home environment has been reviewed: (P) Yes - home with spouse Prior level of function:: (P) Independent Prior/Current Home Services: (P) No current home services Social Determinants of Health Reivew: (P) SDOH reviewed no interventions necessary Readmission risk has been reviewed: (P) Yes Transition of care needs: (P) no transition of care needs at this time

## 2022-12-16 NOTE — Discharge Instructions (Addendum)
Dear Mr. [place]aziland,  We appear appreciate being a part of your care!  You were admitted to the family medicine teaching service for passing out episodes. Given your prior history of A-fib, cardiology was consulted for possible heart causes of your passing out episodes.  Given some of your low blood pressures, cardiology recommends discontinuing your hydrochlorothiazide (fluid pill).  Please ensure your ZIO is mailed back, so it can be read and determined if your heart rhythm was the cause of your passing out.  Continue on your medications as prescribed, stay well-hydrated and use compression socks.  Please follow-up with cardiology outpatient about medication changes and hospitalization.   Best of health going forward,   Family Medicine Teaching Service

## 2022-12-16 NOTE — Assessment & Plan Note (Addendum)
Home regimen: Flecainide 50 mg twice daily, Diltiazem 120 mg daily (30 mg as needed Q4 for heart racing), Eliquis 5 mg twice daily x 3 months post ablation (10/25/2022) - EKG with Afib and slight bradycardia - continuous telemetry -continue home regimen

## 2022-12-16 NOTE — Assessment & Plan Note (Signed)
Home medications: losartan 100mg  daily, hydrochlorothiazide 25mg  daily -continue home meds

## 2022-12-16 NOTE — Progress Notes (Signed)
Daily Progress Note Intern Pager: [phone]  Patient name: Evan Robinson Medical record number: [id number] Date of birth: 1946-03-19 Age: 77 y.o. Gender: male  Primary Care Provider: Genice Rouge, MD Consultants: cardiology Code Status: DNR  Pt Overview and Major Events to Date:  8/15: admitted for syncope  Assessment and Plan: Evan Robinson is a 77 y.o. male with PMH of A-fib on Eliquis, status post ablation x2, hypertension, vertigo, migraines, CAD, presenting with syncope . Differential for presentation of this includes cardiac syncope 2/2 ischemia, arrhythmia, carotid stenosis.  Less likely patient having seizures as did not have any lesions on CTA that would cause mass effect, no fever or infectious/meningeal signs, unlikely primary epileptic disorder.  Could be orthostatic or vasovagal however unlikely given that patient was already standing episodes have happened in different situations.  Patient is on Ambien and Lyrica however has been on these medications chronically unlikely to acutely cause syncope. Echo today, will get cardiology on board.  Pertinent PMH/PSH includes Afib/aflutter, HTN, HLD, OSA, headache, prostate cancer, and C8 neuropathy -      Hospital     * (Principal) Syncope     Differential includes cardiac syncope vs seizures vs orthostatic  hypotension vs medication withdrawal Troponins, BNP WNL. TSH, B12 WNL.  Clinical picture does not appear consistent with seizure.  - Admit to FPTS, Med Tele, Attending Dr. Deirdre Priest - Cardiology to see today - Continue A-fib medications as below - Follow-up echo - Cardiac monitoring -- orthostatic blood pressure measurement today - Will continue Lyrica and Ambien given chronic medications and want to  avoid withdrawal -AM CBC and BMP        HTN (hypertension)     Home medications: losartan 100mg  daily, hydrochlorothiazide 25mg  daily -continue home meds        Persistent atrial fibrillation (HCC)     Home  regimen: Flecainide 50 mg twice daily, Diltiazem 120 mg daily (30  mg as needed Q4 for heart racing), Eliquis 5 mg twice daily x 3 months  post ablation (10/25/2022) - EKG with Afib and slight bradycardia - continuous telemetry -continue home regimen        Electrolyte abnormality     Pt and family report poor PO hydration chronically. Potassium 3.4 this  morning. Appears hypoNa chronically, low 130s. Na 129 today. -s/p LR bolus in the ED - encourage PO fluid intake -AM BMP -replete electrolytes PRN      FEN/GI: heart healthy PPx: on systemic AC Dispo: pending clinical improvement  Subjective:  Patient states he is feeling well this morning.  Denies acute symptoms.  Eager to find out what echo shows.  Discussed that cardiologist will see him today.  Speaking with patient and his family yesterday, sounds like patient does not hydrate very well at home.  Objective: Temp:  [97.3 F (36.3 C)-97.9 F (36.6 C)] 97.6 F (36.4 C) (08/16 0807) Pulse Rate:  [53-64] 55 (08/16 0807) Resp:  [13-18] 18 (08/16 0807) BP: (116-154)/(65-112) 147/73 (08/16 0807) SpO2:  [96 %-100 %] 100 % (08/16 0807) Weight:  [86.2 kg] 86.2 kg (08/15 1431) Physical Exam: General: Pleasant, conversant, resting comfortably in bed in no acute distress Cardiovascular: Regular rate and rhythm, normal S1/S2, no murmurs, rubs, or gallops Respiratory: Lungs clear to auscultation bilaterally, no wheezes, rales, rhonchi Abdomen: Bowel sounds present, soft, nontender, nondistended Extremities: No edema to bilateral lower extremities  Laboratory: Most recent CBC Lab Results  Component Value Date   WBC 6.1 12/16/2022  HGB 12.7 (L) 12/16/2022   HCT 36.4 (L) 12/16/2022   MCV 86.9 12/16/2022   PLT 166 12/16/2022   Most recent BMP    Latest Ref Rng & Units 12/16/2022    6:01 AM  BMP  Glucose 70 - 99 mg/dL 295   BUN 8 - 23 mg/dL 12   Creatinine 6.21 - 1.24 mg/dL 3.08   Sodium 657 - 846 mmol/L 129   Potassium  3.5 - 5.1 mmol/L 3.4   Chloride 98 - 111 mmol/L 94   CO2 22 - 32 mmol/L 27   Calcium 8.9 - 10.3 mg/dL 8.6     Other pertinent labs  TSH WNL B12 WNL    Imaging/Diagnostic Tests: Echo: Carotid US: Lorayne Bender, MD 12/16/2022, 10:53 AM  PGY-1, [facility] Family Medicine FPTS Intern pager: [phone], text pages welcome Secure chat group [facility] Teaching Service

## 2022-12-16 NOTE — Progress Notes (Signed)
PT Cancellation Note  Patient Details Name: Evan Robinson MRN: [id number] DOB: Dec 26, 1945   Cancelled Treatment:    Reason Eval/Treat Not Completed: PT screened, no needs identified, will sign off. Pt independent with all mobility. Pt very active.    Angelina Ok [facility] 12/16/2022, 12:57 PM Skip Mayer PT Acute [employer] [phone]

## 2022-12-16 NOTE — Evaluation (Signed)
Occupational Therapy Evaluation Patient Details Name: Evan Robinson MRN: [id number] DOB: Sep 05, 1945 Today's Date: 12/16/2022   History of Present Illness Pt is a 77 y.o. male presenting 12/15/2022 after two syncopal episodes. PMH of A-fib on Eliquis, status post ablation, hypertension, vertigo, migraines, CAD.   Clinical Impression   PTA, pt lived with wife and was independent in ADL and IADL at home and in the community. Upon eval, pt performing BADL with mod I, passing Short Blessed Test with score of 4, able to retrieve items from floor and perform head movements during hallway ambulation as instructed by OT. Following 4 step commands without difficulty. Soft orthostatic Bps but rising after 3 minutes standing. See general comments. Due to pt independent and no significant change in functional status, OT to sign off. Thank you for this order. No further acute needs identified.       If plan is discharge home, recommend the following: Assist for transportation;Other (comment) (on pt request)    Functional Status Assessment  Patient has had a recent decline in their functional status and demonstrates the ability to make significant improvements in function in a reasonable and predictable amount of time.  Equipment Recommendations  None recommended by OT    Recommendations for Other Services       Precautions / Restrictions Restrictions Weight Bearing Restrictions: No      Mobility Bed Mobility Overal bed mobility: Independent                  Transfers Overall transfer level: Independent                        Balance Overall balance assessment: Modified Independent                                         ADL either performed or assessed with clinical judgement   ADL Overall ADL's : Independent                                             Vision Baseline Vision/History: 0 No visual deficits Ability to See in  Adequate Light: 0 Adequate Patient Visual Report: No change from baseline Vision Assessment?: Wears glasses for reading Additional Comments: has had cataract surgery.     Perception Perception: Within Functional Limits       Praxis Praxis: WFL       Pertinent Vitals/Pain Pain Assessment Pain Assessment: Faces Faces Pain Scale: Hurts a little bit Pain Location: headache Pain Descriptors / Indicators: Headache Pain Intervention(s): Limited activity within patient's tolerance, Monitored during session     Extremity/Trunk Assessment Upper Extremity Assessment Upper Extremity Assessment: Overall WFL for tasks assessed   Lower Extremity Assessment Lower Extremity Assessment: Defer to PT evaluation   Cervical / Trunk Assessment Cervical / Trunk Assessment: Normal   Communication Communication Communication: No apparent difficulties   Cognition Arousal: Alert Behavior During Therapy: WFL for tasks assessed/performed Overall Cognitive Status: Within Functional Limits for tasks assessed                                 General Comments: passing short blessed test with score of 4. Able to perform simple money management.  Wife reports thinking seems normal as well     General Comments  Pt performing grooming, LB ADL, retrieving items from floor, following 4 step commands in hall, and passing short blessed test. Performing head turns while ambulatory and able to track in all quads without difficulty. Orthostatics: BP supine 151/76 (97) HR 57; EOB 138/77 (95) HR 59; standing 122/65 (83) HR 60; standing 3 minutes 137.72 (91) HR 69    Exercises     Shoulder Instructions      Home Living Family/patient expects to be discharged to:: Private residence Living Arrangements: Spouse/significant other Available Help at Discharge: Family Type of Home: House Home Access: Level entry     Home Layout: Able to live on main level with bedroom/bathroom     Bathroom  Shower/Tub: Producer, television/film/video:  ("comfortable")     Home Equipment: None          Prior Functioning/Environment Prior Level of Function : Independent/Modified Independent;Driving             Mobility Comments: no AD ADLs Comments: Independent in home and community        OT Problem List: Impaired balance (sitting and/or standing)      OT Treatment/Interventions:      OT Goals(Current goals can be found in the care plan section) Acute Rehab OT Goals Patient Stated Goal: figure out what is going on OT Goal Formulation: With patient  OT Frequency:      Co-evaluation              AM-PAC OT "6 Clicks" Daily Activity     Outcome Measure Help from another person eating meals?: None Help from another person taking care of personal grooming?: None Help from another person toileting, which includes using toliet, bedpan, or urinal?: None Help from another person bathing (including washing, rinsing, drying)?: None Help from another person to put on and taking off regular upper body clothing?: None Help from another person to put on and taking off regular lower body clothing?: None 6 Click Score: 24   End of Session Equipment Utilized During Treatment: Gait belt Nurse Communication: Mobility status  Activity Tolerance: Patient tolerated treatment well Patient left: in bed;with call bell/phone within reach;with family/visitor present  OT Visit Diagnosis: Unsteadiness on feet (R26.81)                Time: 1610-9604 OT Time Calculation (min): 26 min Charges:  OT General Charges $OT Visit: 1 Visit OT Evaluation $OT Eval Low Complexity: 1 Low OT Treatments $Self Care/Home Management : 8-22 mins  Evan Robinson, OTR/L [facility] Acute Rehabilitation Office: [phone]   Myrla Halsted 12/16/2022, 12:36 PM

## 2022-12-16 NOTE — Assessment & Plan Note (Addendum)
Pt and family report poor PO hydration chronically. Potassium 3.4 this morning. Appears hypoNa chronically, low 130s. Na 129 today. -s/p LR bolus in the ED - encourage PO fluid intake -AM BMP -replete electrolytes PRN

## 2022-12-17 ENCOUNTER — Encounter (HOSPITAL_COMMUNITY): Payer: Medicare PPO

## 2022-12-17 ENCOUNTER — Other Ambulatory Visit (HOSPITAL_COMMUNITY): Payer: Self-pay

## 2022-12-17 DIAGNOSIS — R55 Syncope and collapse: Secondary | ICD-10-CM | POA: Diagnosis not present

## 2022-12-17 LAB — BASIC METABOLIC PANEL
Anion gap: 11 (ref 5–15)
BUN: 11 mg/dL (ref 8–23)
CO2: 24 mmol/L (ref 22–32)
Calcium: 8.8 mg/dL — ABNORMAL LOW (ref 8.9–10.3)
Chloride: 92 mmol/L — ABNORMAL LOW (ref 98–111)
Creatinine, Ser: 0.96 mg/dL (ref 0.61–1.24)
GFR, Estimated: >60 mL/min (ref >60)
Glucose, Bld: 105 mg/dL — ABNORMAL HIGH (ref 70–99)
Potassium: 3.7 mmol/L (ref 3.5–5.1)
Sodium: 127 mmol/L — ABNORMAL LOW (ref 135–145)

## 2022-12-17 LAB — CORTISOL: Cortisol, Plasma: 10.3 ug/dL

## 2022-12-17 NOTE — Assessment & Plan Note (Addendum)
Pt and family report poor PO hydration chronically. Potassium 3.7 this morning s/p repletion. Appears hypoNa chronically, low 130s. Na 127 today. -s/p LR bolus in the ED - encourage PO fluid intake -holding hydrochlorothiazide as above -AM BMP -replete electrolytes PRN

## 2022-12-17 NOTE — Assessment & Plan Note (Addendum)
Home regimen: Flecainide 50 mg twice daily, Eliquis 5 mg twice daily x 3 months post ablation (10/25/2022) - EKG with Afib and slight bradycardia - continuous telemetry -continue Flecainide and Eliquis

## 2022-12-17 NOTE — Care Management CC44 (Signed)
Condition Code 44 Documentation Completed  Patient Details  Name: Jolly Swaziland MRN: [id number] Date of Birth: [birth year]   Condition Code 44 given:  Yes Patient signature on Condition Code 44 notice:  Yes Documentation of 2 MD's agreement:  Yes Code 44 added to claim:  Yes    Lawerance Sabal, RN 12/17/2022, 9:31 AM

## 2022-12-17 NOTE — Care Management Obs Status (Signed)
MEDICARE OBSERVATION STATUS NOTIFICATION   Patient Details  Name: Evan Robinson MRN: [id number] Date of Birth: 1945-05-19   Medicare Observation Status Notification Given:  Yes    Lawerance Sabal, RN 12/17/2022, 9:31 AM

## 2022-12-17 NOTE — Plan of Care (Signed)

## 2022-12-17 NOTE — Discharge Summary (Cosign Needed Addendum)
Family Medicine Teaching [facility] Discharge Summary  Patient name: Evan Robinson Medical record number: [id number] Date of birth: 01-07-1946 Age: 77 y.o. Gender: male Date of Admission: 12/15/2022  Date of Discharge: 12/17/22 Admitting Physician: Evan Mattocks, DO  Primary Care Provider: Genice Rouge, MD Consultants: cardiology  Indication for Hospitalization: syncope  Discharge Diagnoses/Problem List:  Principal Problem for Admission: syncope Other Problems addressed during stay:  Principal Problem:   Syncope Active Problems:   HTN (hypertension)   Persistent atrial fibrillation [place])   Electrolyte abnormality    Brief Hospital Course:  Evan Robinson is a 77 y.o.male with a history of A-fib on Eliquis, status post ablation, hypertension, vertigo, migraines, CAD who was admitted to the family medicine Teaching Service at [facility] for syncope. His hospital course is detailed below:   Syncope Upon presentation, patient believes that he passed out today while he was shopping.  His wife found him in the car and he was not responsive.  The patient also reported having a massive headache, different from his normal migraines.  He reported nausea and sweating before the syncopal episode.  The patient reports a similar instance 2 weeks prior where cardiology evaluated him and he was placed on a ZIO monitor at that time.  Admission labs were rather unremarkable, with a troponin of 4.  EKG did show A-fib and no ischemic changes.  Chest x-ray showed no acute findings with a stable elevated right hemidiaphragm.  CT head and neck was also negative for acute findings however with mild arthrosclerotic changes of the bilateral carotid bifurcations without significant stenosis. Cardiology was consulted during inpatient and recommended attempting to get information from recent zio patch to rule in/out arrhythmia as a cause of syncope. They also recommended holding his home hydrochlorothiazide due  to ongoing hypokalemia and hyponatremia (lowest 127 inpatient). Echo showed an EF 45-50%, left ventricular internal cavity size was severely dilated, moderate mitral valve prolapse and no evidence of aortic stenosis. Plasma metanephrines were ordered to evaluate for pheochromocytoma. Carotid ultrasound was recommended by cards, will complete in clinic. Orthostatic vitals were negative. Syncopal episodes likely vasovagal in nature.   Hyponatremia Patient with hyponatremia to 128 on admission and 127-130 during hospital stay. Appears euvolemic on exam. Suspect this is secondary to chronic poor fluid intake as reported by pt and family vs hydrochlorothiazide use vs SIADH vs adrenal insufficiency. Held hydrochlorothiazide on 8/16. Random blood cortisol was drawn prior to discharge.  Other chronic conditions were medically managed with home medications and formulary alternatives as necessary (Persistent afib, HTN, c8 radiculopathy and OSA ) Persistent atrial fibrillation: Continue diltiazem 120 mg, flecainide 50 mg, apixaban 5 mg twice daily Hypertension: Continue losartan 100 mg, hydrochlorothiazide 25 mg (held per cardiology recs)  C8 radiculopathy: Continue Lyrica 100 mg 3 times daily, baclofen as needed OSA: Continue CPAP at night and Ambien 10 mg   PCP Follow-up Recommendations: Follow-up with cardiology, carotid US, medications, etc Check blood pressures at follow-up, discontinued hydrochlorothiazide due to hypotension, hyponatremia, and hypokalemia. Repeat BMP Follow up plasma metenephrine and cortisol Ensure good PO hydration    Disposition: home  Discharge Condition: stable  Discharge Exam:  Vitals:   12/17/22 0749 12/17/22 1209  BP: 127/72 (!) 142/75  Pulse: 67 (!) 55  Resp: 17 17  Temp: 97.8 F (36.6 C) 97.7 F (36.5 C)  SpO2: 99% 96%   General: Pleasant, conversant, resting in bed, in no acute distress Cardiovascular: Irregularly irregular rate and rhythm, normal S1/S2, no  murmurs, rubs,  gallops Respiratory: Clear to auscultation bilaterally, no wheezes rales or rhonchi Abdomen: Bowel sounds present, soft, nontender, nondistended Extremities: No edema to bilateral lower extremities  Significant Procedures: none  Significant Labs and Imaging:  Recent Labs  Lab 12/15/22 1422 12/15/22 1458 12/16/22 0601  WBC 11.7*  --  6.1  HGB 14.4 14.6 12.7*  HCT 41.1 43.0 36.4*  PLT 191  --  166   Recent Labs  Lab 12/15/22 1422 12/15/22 1458 12/16/22 0601 12/17/22 0608  NA 128* 130* 129* 127*  K 3.6 3.6 3.4* 3.7  CL 93* 93* 94* 92*  CO2 24  --  27 24  GLUCOSE 141* 140* 106* 105*  BUN 15 16 12 11   CREATININE 1.38* 1.40* 0.97 0.96  CALCIUM 8.9  --  8.6* 8.8*  MG 1.7  --   --   --   ALKPHOS 75  --   --   --   AST 23  --   --   --   ALT 15  --   --   --   ALBUMIN 4.3  --   --   --    Echo 12/16/22  1. LVEF is unchanged compared to images from study donw in 2023 . Left  ventricular ejection fraction, by estimation, is 45 to 50%. The left  ventricle has mildly decreased function. The left ventricular internal  cavity size was severely dilated. Left  ventricular diastolic parameters were normal.   2. Right ventricular systolic function is mildly reduced. The right  ventricular size is mildly enlarged.   3. Mild mitral valve regurgitation. 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   4. The aortic valve is tricuspid. Aortic valve regurgitation is not  visualized. Aortic valve sclerosis/calcification is present, without any  evidence of aortic stenosis.    Results/Tests Pending at Time of Discharge:  -Plasma metanephrines -Random cortisol -Bilateral carotid US (will do at cards clinic)  Discharge Medications:  Allergies as of 12/17/2022       Reactions   Asa [aspirin] Other (See Comments)   Avoids due to Eliquis.   Nsaids Other (See Comments)   Avoids due to Eliquis.   Tricor [fenofibrate] Other (See Comments)   Myalgias          Medication List     STOP taking these medications    ALPRAZolam 0.5 MG tablet Commonly known as: XANAX   hydrochlorothiazide 25 MG tablet Commonly known as: HYDRODIURIL       TAKE these medications    acetaminophen 500 MG tablet Commonly known as: TYLENOL Take 1,000 mg by mouth daily as needed for headache or moderate pain.   b complex vitamins capsule Take 1 capsule by mouth daily.   CLEAR EYES FOR DRY EYES OP Place 1 drop into both eyes 3 (three) times daily as needed (dryness, irritation).   diltiazem 120 MG 24 hr capsule Commonly known as: CARDIZEM CD TAKE 1 CAPSULE BY MOUTH DAILY   diltiazem 30 MG tablet Commonly known as: Cardizem Take 1 Tablet Every 4 Hours As Needed For HR >100 and Top BP>100   Eliquis 5 MG Tabs tablet Generic drug: apixaban TAKE 1 TABLET(5 MG) BY MOUTH TWICE DAILY   flecainide 50 MG tablet Commonly known as: TAMBOCOR Take 1 tablet (50 mg total) by mouth 2 (two) times daily.   gemfibrozil 600 MG tablet Commonly known as: LOPID Take 600 mg by mouth daily.   HYDROcodone-acetaminophen 5-325 MG tablet Commonly known as:  NORCO/VICODIN Take 0.5-1 tablets by mouth every 6 (six) hours as needed (severe headache).   losartan 25 MG tablet Commonly known as: COZAAR TAKE 1 TABLET(25 MG) BY MOUTH EVERY DAY   meclizine 25 MG tablet Commonly known as: ANTIVERT Take 1 tablet (25 mg total) by mouth 3 (three) times daily as needed for dizziness. What changed: how much to take   pregabalin 100 MG capsule Commonly known as: LYRICA Take 100 mg by mouth 3 (three) times daily.   SUMAtriptan 50 MG tablet Commonly known as: Imitrex Take 1 tablet (50 mg total) by mouth once as needed for migraine. May repeat in 2 hours if headache persists or recurs.   VITAMIN C PO Take 1 tablet by mouth daily.   zolpidem 10 MG tablet Commonly known as: AMBIEN Take 10 mg by mouth at bedtime.        Discharge Instructions: Please refer to Patient  Instructions section of EMR for full details.  Patient was counseled important signs and symptoms that should prompt return to medical care, changes in medications, dietary instructions, activity restrictions, and follow up appointments.   Follow-Up Appointments:  Future Appointments  Date Time Provider Department Center  12/19/2022  2:00 PM [facility] VASC US 1 - OUTPATIENT ONLY [facility]  12/19/2022  3:00 PM [facility] ECHO/CH OP [facility]  01/26/2023 11:40 AM Chilton Si, MD [facility]  02/16/2023  9:30 AM Raulkar, Drema Pry, MD [facility] CPR  03/01/2023  1:45 PM Lanier Prude, MD [facility]     Lorayne Bender, MD 12/17/2022, 12:40 PM PGY-1, [facility]  Upper Level Attestation I have seen and examined the patient with the resident Dr.Hindel. I agree with the history, physical, and assessment above with any necessary edits.   Lockie Mola, MD  Family Medicine Teaching Service

## 2022-12-17 NOTE — Assessment & Plan Note (Addendum)
Home medications: losartan 100mg  daily, diltiazem 120mg  daily hydrochlorothiazide 25mg  daily -continue Losartan and Diltiazem -hold hydrochlorothiazide as below

## 2022-12-17 NOTE — Progress Notes (Signed)
Daily Progress Note Intern Pager: [phone]  Patient name: Evan Robinson Medical record number: [id number] Date of birth: 1945-10-01 Age: 77 y.o. Gender: male  Primary Care Provider: Genice Rouge, MD Consultants: cardiology Code Status: DNR   Assessment and Plan: Evan Robinson is a 77 y.o. male with PMH of A-fib on Eliquis, status post ablation x2, hypertension, vertigo, migraines, CAD, presenting with syncope . Differential for presentation of this includes cardiac syncope 2/2 ischemia, arrhythmia, carotid stenosis.  Less likely patient having seizures as did not have any lesions on CTA that would cause mass effect, no fever or infectious/meningeal signs, unlikely primary epileptic disorder.  Could be orthostatic or vasovagal however unlikely given that patient was already standing episodes have happened in different situations.  Patient is on Ambien and Lyrica however has been on these medications chronically unlikely to acutely cause syncope. Cardiology saw pt, recommend interrogating Zio monitor outpatient, ordered carotid doppler. Will check orthostatic vitals and r/o pheo. Holding hydrochlorothiazide due to electrolyte abnormalities.  Pertinent PMH/PSH includes Afib/aflutter, HTN, HLD, OSA, headache, prostate cancer, and C8 neuropathy.   Assessment & Plan Syncope Differential includes cardiac syncope vs seizures vs orthostatic hypotension vs medication withdrawal. Pheochromocytoma also possible given labile pressures per cards. Troponins, BNP WNL. TSH, B12 WNL. Clinical picture does not appear consistent with seizure. Was wearing holter monitor during most recent syncopal event. Echo showed unchanged LVEF, mildly reduced RV function, mild MR. Afib with PVCs on telemetry. - Admit to FPTS, Med Tele, Attending Dr. Deirdre Priest - Cardiology rec interrogating cardiac monitor (wife mailed it back), okay to d/c today if pressures stable and f/u outpatient -- Carotid doppler per cards --  F/u plasma metanephrines - Continue A-fib medications as below - Cardiac monitoring -- orthostatic blood pressure measurement today - Will continue Lyrica and Ambien given chronic medications and want to avoid withdrawal -AM CBC and BMP HTN (hypertension) Home medications: losartan 100mg  daily, diltiazem 120mg  daily hydrochlorothiazide 25mg  daily -continue Losartan and Diltiazem -hold hydrochlorothiazide as below Persistent atrial fibrillation (HCC) Home regimen: Flecainide 50 mg twice daily, Eliquis 5 mg twice daily x 3 months post ablation (10/25/2022) - EKG with Afib and slight bradycardia - continuous telemetry -continue Flecainide and Eliquis Electrolyte abnormality Pt and family report poor PO hydration chronically. Potassium 3.7 this morning s/p repletion. Appears hypoNa chronically, low 130s. Na 127 today. -s/p LR bolus in the ED - encourage PO fluid intake -holding hydrochlorothiazide as above -AM BMP -replete electrolytes PRN   FEN/GI: heart healthy PPx: on eliquis Dispo: home today pending clinical stability  Subjective:  Patient states he is doing well this morning.  No chest pain, shortness of breath, or other acute complaints.  Feels ready to go home today after his carotid ultrasound.  Has a follow-up appointment scheduled with cardiology.  Objective: Temp:  [97.5 F (36.4 C)-98 F (36.7 C)] 97.5 F (36.4 C) (08/17 0440) Pulse Rate:  [55-62] 62 (08/17 0440) Resp:  [18] 18 (08/16 1646) BP: (133-168)/(70-88) 133/70 (08/17 0440) SpO2:  [97 %-100 %] 98 % (08/17 0440) Physical Exam: General: Pleasant, conversant, resting in bed, in no acute distress Cardiovascular: Irregularly irregular rate and rhythm, normal S1/S2, no murmurs, rubs, gallops Respiratory: Clear to auscultation bilaterally, no wheezes rales or rhonchi Abdomen: Bowel sounds present, soft, nontender, nondistended Extremities: No edema to bilateral lower extremities  Laboratory: Most recent  CBC Lab Results  Component Value Date   WBC 6.1 12/16/2022   HGB 12.7 (L) 12/16/2022   HCT 36.4 (L)  12/16/2022   MCV 86.9 12/16/2022   PLT 166 12/16/2022   Most recent BMP    Latest Ref Rng & Units 12/17/2022    6:08 AM  BMP  Glucose 70 - 99 mg/dL 119   BUN 8 - 23 mg/dL 11   Creatinine 1.47 - 1.24 mg/dL 8.29   Sodium 562 - 130 mmol/L 127   Potassium 3.5 - 5.1 mmol/L 3.7   Chloride 98 - 111 mmol/L 92   CO2 22 - 32 mmol/L 24   Calcium 8.9 - 10.3 mg/dL 8.8     Other pertinent labs  Plasma metanephrines pending  Imaging/Diagnostic Tests: Echo 8/18 1. LVEF is unchanged compared to images from study donw in 2023 . Left  ventricular ejection fraction, by estimation, is 45 to 50%. The left  ventricle has mildly decreased function. The left ventricular internal  cavity size was severely dilated. Left  ventricular diastolic parameters were normal.   2. Right ventricular systolic function is mildly reduced. The right  ventricular size is mildly enlarged.   3. Mild mitral valve regurgitation. 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   4. The aortic valve is tricuspid. Aortic valve regurgitation is not  visualized. Aortic valve sclerosis/calcification is present, without any  evidence of aortic stenosis.   Lorayne Bender, MD 12/17/2022, 7:46 AM  PGY-1, [facility] FPTS Intern pager: [phone], text pages welcome Secure chat group [facility] Teaching Service

## 2022-12-17 NOTE — Progress Notes (Signed)
Cardiology Progress Note  Patient ID: Evan Robinson MRN: [id number] DOB: 1945-09-29 Date of Encounter: 12/17/2022  Primary Cardiologist: Chilton Si, MD  Subjective   Chief Complaint: None.   HPI: Telemetry was sinus bradycardia.  Echo unchanged.  CV exam normal.  No carotid bruits.  Sodium continues to climb down.  Reports he may drink 30 ounces of water daily.  Suspect symptoms are all related to dehydration.  ROS:  All other ROS reviewed and negative. Pertinent positives noted in the HPI.     Inpatient Medications  Scheduled Meds:  apixaban  5 mg Oral BID   diltiazem  120 mg Oral Daily   flecainide  50 mg Oral BID   losartan  100 mg Oral Daily   pregabalin  100 mg Oral TID   sodium chloride flush  3 mL Intravenous Q12H   zolpidem  10 mg Oral QHS   Continuous Infusions:  PRN Meds: acetaminophen, baclofen, diltiazem, SUMAtriptan   Vital Signs   Vitals:   12/16/22 2006 12/17/22 0032 12/17/22 0440 12/17/22 0749  BP: 139/75 136/73 133/70 127/72  Pulse: (!) 59 (!) 55 62 67  Resp:    17  Temp: 97.9 F (36.6 C)  (!) 97.5 F (36.4 C) 97.8 F (36.6 C)  TempSrc: Oral  Oral Oral  SpO2: 97% 98% 98% 99%  Weight:      Height:        Intake/Output Summary (Last 24 hours) at 12/17/2022 1106 Last data filed at 12/17/2022 1001 Gross per 24 hour  Intake 240 ml  Output --  Net 240 ml      12/15/2022    2:31 PM 12/07/2022    2:19 PM 11/22/2022    9:21 AM  Last 3 Weights  Weight (lbs) 190 lb 190 lb 10.1 oz 189 lb 6.4 oz  Weight (kg) 86.183 kg 86.469 kg 85.911 kg      Telemetry  Overnight telemetry shows SB 50-60 bpm, which I personally reviewed.   ECG  The most recent ECG shows sinus rhythm heart rate 66 (not atrial fibrillation), which I personally reviewed.   Physical Exam   Vitals:   12/16/22 2006 12/17/22 0032 12/17/22 0440 12/17/22 0749  BP: 139/75 136/73 133/70 127/72  Pulse: (!) 59 (!) 55 62 67  Resp:    17  Temp: 97.9 F (36.6 C)  (!) 97.5 F (36.4  C) 97.8 F (36.6 C)  TempSrc: Oral  Oral Oral  SpO2: 97% 98% 98% 99%  Weight:      Height:        Intake/Output Summary (Last 24 hours) at 12/17/2022 1106 Last data filed at 12/17/2022 1001 Gross per 24 hour  Intake 240 ml  Output --  Net 240 ml       12/15/2022    2:31 PM 12/07/2022    2:19 PM 11/22/2022    9:21 AM  Last 3 Weights  Weight (lbs) 190 lb 190 lb 10.1 oz 189 lb 6.4 oz  Weight (kg) 86.183 kg 86.469 kg 85.911 kg    Body mass index is 25.77 kg/m.  General: Well nourished, well developed, in no acute distress Head: Atraumatic, normal size  Eyes: PEERLA, EOMI  Neck: Supple, no JVD Endocrine: No thryomegaly Cardiac: Normal S1, S2; RRR; no murmurs, rubs, or gallops Lungs: Clear to auscultation bilaterally, no wheezing, rhonchi or rales  Abd: Soft, nontender, no hepatomegaly  Ext: No edema, pulses 2+ Musculoskeletal: No deformities, BUE and BLE strength normal and equal Skin: Warm and dry,  no rashes   Neuro: Alert and oriented to person, place, time, and situation, CNII-XII grossly intact, no focal deficits  Psych: Normal mood and affect   Labs  High Sensitivity Troponin:   Recent Labs  Lab 12/15/22 1422 12/15/22 1637  TROPONINIHS 4 4     Cardiac EnzymesNo results for input(s): "TROPONINI" in the last 168 hours. No results for input(s): "TROPIPOC" in the last 168 hours.  Chemistry Recent Labs  Lab 12/15/22 1422 12/15/22 1458 12/16/22 0601 12/17/22 0608  NA 128* 130* 129* 127*  K 3.6 3.6 3.4* 3.7  CL 93* 93* 94* 92*  CO2 24  --  27 24  GLUCOSE 141* 140* 106* 105*  BUN 15 16 12 11   CREATININE 1.38* 1.40* 0.97 0.96  CALCIUM 8.9  --  8.6* 8.8*  PROT 6.7  --   --   --   ALBUMIN 4.3  --   --   --   AST 23  --   --   --   ALT 15  --   --   --   ALKPHOS 75  --   --   --   BILITOT 0.9  --   --   --   GFRNONAA 53*  --  >60 >60  ANIONGAP 11  --  8 11    Hematology Recent Labs  Lab 12/15/22 1422 12/15/22 1458 12/16/22 0601  WBC 11.7*  --  6.1  RBC  4.70  --  4.19*  HGB 14.4 14.6 12.7*  HCT 41.1 43.0 36.4*  MCV 87.4  --  86.9  MCH 30.6  --  30.3  MCHC 35.0  --  34.9  RDW 12.7  --  12.8  PLT 191  --  166   BNP Recent Labs  Lab 12/15/22 2034  BNP 57.3    DDimer No results for input(s): "DDIMER" in the last 168 hours.   Radiology  ECHOCARDIOGRAM COMPLETE  Result Date: 12/16/2022    ECHOCARDIOGRAM REPORT   Patient Name:   Evan Robinson Date of Exam: 12/16/2022 Medical Rec #:  [id number]     Height:       72.0 in Accession #:    [id number]    Weight:       190.0 lb Date of Birth:  1946/04/05     BSA:          2.085 m Patient Age:    76 years      BP:           147/73 mmHg Patient Gender: M             HR:           63 bpm. Exam Location:  Inpatient Procedure: 2D Echo, Cardiac Doppler and Color Doppler Indications:    Syncope R55  History:        Patient has prior history of Echocardiogram examinations, most                 recent 10/25/2022.  Sonographer:    Harriette Bouillon RDCS Referring Phys: [phone] WHITNEY PLUNKETT IMPRESSIONS  1. LVEF is unchanged compared to images from study donw in 2023 . Left ventricular ejection fraction, by estimation, is 45 to 50%. The left ventricle has mildly decreased function. The left ventricular internal cavity size was severely dilated. Left ventricular diastolic parameters were normal.  2. Right ventricular systolic function is mildly reduced. The right ventricular size is mildly enlarged.  3. Mild mitral valve regurgitation. There is  moderate prolapse of both leaflets of the mitral valve.  4. The aortic valve is tricuspid. Aortic valve regurgitation is not visualized. Aortic valve sclerosis/calcification is present, without any evidence of aortic stenosis. FINDINGS  Left Ventricle: LVEF is unchanged compared to images from study donw in 2023. Left ventricular ejection fraction, by estimation, is 45 to 50%. The left ventricle has mildly decreased function. The left ventricular internal cavity size was severely  dilated. There is no left ventricular hypertrophy. Left ventricular diastolic parameters were normal. Right Ventricle: The right ventricular size is mildly enlarged. Right vetricular wall thickness was not assessed. Right ventricular systolic function is mildly reduced. Left Atrium: Left atrial size was normal in size. Right Atrium: Right atrial size was normal in size. Pericardium: There is no evidence of pericardial effusion. Mitral Valve: There is moderate prolapse of both leaflets of the mitral valve. There is mild thickening of the mitral valve leaflet(s). Mild mitral valve regurgitation. Tricuspid Valve: The tricuspid valve is normal in structure. Tricuspid valve regurgitation is mild. Aortic Valve: The aortic valve is tricuspid. Aortic valve regurgitation is not visualized. Aortic valve sclerosis/calcification is present, without any evidence of aortic stenosis. Pulmonic Valve: The pulmonic valve was normal in structure. Pulmonic valve regurgitation is trivial. Aorta: The aortic root and ascending aorta are structurally normal, with no evidence of dilitation. IAS/Shunts: No atrial level shunt detected by color flow Doppler.  LEFT VENTRICLE PLAX 2D LVIDd:         6.40 cm   Diastology LVIDs:         4.20 cm   LV e' medial:    6.74 cm/s LV PW:         0.80 cm   LV E/e' medial:  9.4 LV IVS:        0.70 cm   LV e' lateral:   10.20 cm/s LVOT diam:     2.10 cm   LV E/e' lateral: 6.2 LV SV:         58 LV SV Index:   28 LVOT Area:     3.46 cm  RIGHT VENTRICLE TAPSE (M-mode): 3.6 cm LEFT ATRIUM             Index        RIGHT ATRIUM           Index LA diam:        4.10 cm 1.97 cm/m   RA Area:     15.20 cm LA Vol (A2C):   74.5 ml 35.74 ml/m  RA Volume:   38.40 ml  18.42 ml/m LA Vol (A4C):   47.5 ml 22.79 ml/m LA Biplane Vol: 62.6 ml 30.03 ml/m  AORTIC VALVE             PULMONIC VALVE LVOT Vmax:   80.60 cm/s  PR End Diast Vel: 5.11 msec LVOT Vmean:  51.800 cm/s LVOT VTI:    0.167 m  AORTA Ao Root diam: 4.00 cm Ao  Asc diam:  3.50 cm MITRAL VALVE               TRICUSPID VALVE MV Area (PHT): 2.69 cm    TR Peak grad:   38.7 mmHg MV Decel Time: 282 msec    TR Vmax:        311.00 cm/s MV E velocity: 63.30 cm/s MV A velocity: 48.70 cm/s  SHUNTS MV E/A ratio:  1.30        Systemic VTI:  0.17 m  Systemic Diam: 2.10 cm [name] MD Electronically signed by [name] MD Signature Date/Time: 12/16/2022/12:45:22 PM    Final    DG Chest 2 View  Result Date: 12/15/2022 CLINICAL DATA:  Syncope EXAM: CHEST - 2 VIEW COMPARISON:  X-ray 03/21/2021 FINDINGS: Slight elevation of the right hemidiaphragm. No consolidation, pneumothorax or effusion. No edema. Normal cardiopericardial silhouette. Overlapping cardiac leads. Metallic device overlies the left upper thorax. Air-fluid level along the stomach beneath the left hemidiaphragm. IMPRESSION: No acute cardiopulmonary disease. Stable elevated right hemidiaphragm Electronically Signed   By: Karen Kays M.D.   On: 12/15/2022 17:06   CT ANGIO HEAD NECK W WO CM  Result Date: 12/15/2022 CLINICAL DATA:  Syncope/presyncope. Cerebrovascular cause suspected. EXAM: CT ANGIOGRAPHY HEAD AND NECK WITH AND WITHOUT CONTRAST TECHNIQUE: Multidetector CT imaging of the head and neck was performed using the standard protocol during bolus administration of intravenous contrast. Multiplanar CT image reconstructions and MIPs were obtained to evaluate the vascular anatomy. Carotid stenosis measurements (when applicable) are obtained utilizing NASCET criteria, using the distal internal carotid diameter as the denominator. RADIATION DOSE REDUCTION: This exam was performed according to the departmental dose-optimization program which includes automated exposure control, adjustment of the mA and/or kV according to patient size and/or use of iterative reconstruction technique. CONTRAST:  75mL OMNIPAQUE IOHEXOL 350 MG/ML SOLN COMPARISON:  CT and MRI October 04, 2021. FINDINGS: CT HEAD FINDINGS  Brain: No evidence of acute infarction, hemorrhage, hydrocephalus, extra-axial collection or mass lesion/mass effect. Mild parenchymal volume loss, more evident at the cerebellar vermis. Vascular: No hyperdense vessel or unexpected calcification. Skull: Normal. Negative for fracture or focal lesion. Sinuses/Orbits: Mild mucosal thickening of the right maxillary sinus and ethmoid cells. The orbits are maintained. Other: None. Review of the MIP images confirms the above findings CTA NECK FINDINGS Aortic arch: Common origin of the innominate and left common carotid artery from the aortic arch. Imaged portion shows no evidence of aneurysm or dissection. Atherosclerotic disease with mixed density plaque measuring up to 4 mm. No significant stenosis of the major arch vessel origins. Right carotid system: Mild atherosclerotic disease of the right carotid bifurcation without hemodynamically significant stenosis. Left carotid system: Mild atherosclerotic disease of the left carotid bifurcation without hemodynamically significant stenosis. Vertebral arteries: Calcified atherosclerotic changes at the origin of the right subclavian artery without hemodynamically significant stenosis. No evidence of significant atherosclerotic disease or stenosis along the cervical course of the bilateral vertebral arteries. Skeleton: Diffuse heterogeneity of the visualized spine with lucencies and coarse trabeculated. Flowing syndesmophytes along the cervical spine and visualized upper thoracic spine. Other neck: Negative. Upper chest: Coronary calcified atherosclerotic disease. Review of the MIP images confirms the above findings CTA HEAD FINDINGS Anterior circulation: Mild atherosclerotic changes in the bilateral carotid siphons without significant stenosis. A 2-3 mm outpouching from the supraclinoid right ICA at the origin of the posterior communicating artery is favored to represent an infundibulum. No significant stenosis, proximal  occlusion, aneurysm or vascular malformation. Posterior circulation: No significant stenosis, proximal occlusion, aneurysm, or vascular malformation. Venous sinuses: As permitted by contrast timing, patent. Anatomic variants: None significant. Review of the MIP images confirms the above findings IMPRESSION: 1. No acute intracranial abnormality. 2. No intracranial large vessel occlusion or significant stenosis. 3. Mild atherosclerotic changes of the bilateral carotid bifurcations and carotid siphons without hemodynamically significant stenosis. Electronically Signed   By: Baldemar Lenis M.D.   On: 12/15/2022 16:29    Cardiac Studies  TTE 12/16/2022  1. LVEF  is unchanged compared to images from study donw in 2023 . Left  ventricular ejection fraction, by estimation, is 45 to 50%. The left  ventricle has mildly decreased function. The left ventricular internal  cavity size was severely dilated. Left  ventricular diastolic parameters were normal.   2. Right ventricular systolic function is mildly reduced. The right  ventricular size is mildly enlarged.   3. Mild mitral valve regurgitation. 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   4. The aortic valve is tricuspid. Aortic valve regurgitation is not  visualized. Aortic valve sclerosis/calcification is present, without any  evidence of aortic stenosis.   Patient Profile  Erasmo Robinson is a 77 y.o. male with hypertension, atrial flutter/atrial fibrillation status post ablation, OSA who was admitted on 12/16/2022 for syncope.  Assessment & Plan   # Syncope # Suspect dehydration as the etiology -Describes 2 syncopal episodes.  He may drink 30 ounces of water per day.  1 episode did revolve around dizziness and lightheadedness. -Telemetry unremarkable here. -Echo shows EF 45 to 50% which is unchanged. -No recurrence of arrhythmia. -Troponin is negative.  BNP normal. -I have encouraged him to double or triple  his water intake.  He is clearly dehydrated with a low chloride value and sodium level that continues to decline.  Would recommend hydration and workup of hyponatremia as this could be contributing.  I would defer this to the hospital team. -No further cardiac workup indicated.  He will follow-up with Dr. Duke Salvia.  # Hyponatremia -Further workup per hospital medicine team.  Suspect dehydration is a big issue.  May benefit from intravenous fluid challenge to see if his sodium improves.  I think hyponatremia could be contributing to his overall symptoms of syncope.  [facility] will sign off.   Medication Recommendations:  As above Other recommendations (labs, testing, etc):  None Follow up as an outpatient:  4-6 weeks with Dr. Duke Salvia  For questions or updates, please contact [facility] Please consult [email or web address] for contact info under        Signed, Gerri Spore T. Flora Lipps, MD, [facility] [place]  [facility]  12/17/2022 11:06 AM

## 2022-12-17 NOTE — Assessment & Plan Note (Addendum)
Differential includes cardiac syncope vs seizures vs orthostatic hypotension vs medication withdrawal. Pheochromocytoma also possible given labile pressures per cards. Troponins, BNP WNL. TSH, B12 WNL. Clinical picture does not appear consistent with seizure. Was wearing holter monitor during most recent syncopal event. Echo showed unchanged LVEF, mildly reduced RV function, mild MR. Afib with PVCs on telemetry. - Admit to FPTS, Med Tele, Attending Dr. Deirdre Priest - Cardiology rec interrogating cardiac monitor (wife mailed it back), okay to d/c today if pressures stable and f/u outpatient -- Carotid doppler per cards -- F/u plasma metanephrines - Continue A-fib medications as below - Cardiac monitoring -- orthostatic blood pressure measurement today - Will continue Lyrica and Ambien given chronic medications and want to avoid withdrawal -AM CBC and BMP

## 2022-12-19 ENCOUNTER — Ambulatory Visit (HOSPITAL_COMMUNITY): Admission: RE | Admit: 2022-12-19 | Payer: Medicare PPO | Source: Ambulatory Visit

## 2022-12-27 ENCOUNTER — Encounter: Payer: Self-pay | Admitting: Cardiology

## 2022-12-28 LAB — METANEPHRINES, PLASMA
Metanephrine, Free: <25 pg/mL (ref 0.0–88.0)
Normetanephrine, Free: 70.4 pg/mL (ref 0.0–285.2)

## 2023-01-08 ENCOUNTER — Other Ambulatory Visit: Payer: Self-pay | Admitting: Physical Medicine and Rehabilitation

## 2023-01-11 ENCOUNTER — Telehealth: Payer: Self-pay | Admitting: Cardiovascular Disease

## 2023-01-11 NOTE — Telephone Encounter (Signed)
Pt c/o BP issue: STAT if pt c/o blurred vision, one-sided weakness or slurred speech  1. What are your last 5 BP readings? 80-100/80  2. Are you having any other symptoms (ex. Dizziness, headache, blurred vision, passed out)? No  3. What is your BP issue? Patient states his bp is typically 130-140/80. Patient states his Systolic number has been dropping around 80-100. Please advise.

## 2023-01-11 NOTE — Telephone Encounter (Signed)
Returned call to patient,   Patient states he was in the hospital about 2 weeks ago. He states they never really figured out what was wrong. He states he has been doing okay since then. He states during the night his pulse shot up from 55 to well over 100, he states this just started to get better about an hour ago. He did check his blood pressure a bit ago, 102/70. He did take it sitting and then standing it and dropped down into the 60s. He denies any signs of infection. He does have dilt to take over for elevated BP and HR, he did take that this morning, but symptoms had started before that. Denies any chest pain or shortness of breath. He did notice a little dizziness or lightheadedness when he stood up and dropped. He can feel his heart beating in his chest.   Offered patient sooner appointment with Gillian Shields, NP patient prefers to be seen by Dr. Duke Salvia, he is okay being added to the wait list in case she has a cancellation. Advised patient to call back should symptoms worsen.

## 2023-01-26 ENCOUNTER — Encounter (HOSPITAL_BASED_OUTPATIENT_CLINIC_OR_DEPARTMENT_OTHER): Payer: Self-pay | Admitting: Cardiovascular Disease

## 2023-01-26 ENCOUNTER — Ambulatory Visit (HOSPITAL_BASED_OUTPATIENT_CLINIC_OR_DEPARTMENT_OTHER): Payer: Medicare PPO | Admitting: Cardiovascular Disease

## 2023-01-26 VITALS — BP 148/80 | HR 66 | Ht 72.0 in | Wt 195.0 lb

## 2023-01-26 DIAGNOSIS — I2510 Atherosclerotic heart disease of native coronary artery without angina pectoris: Secondary | ICD-10-CM

## 2023-01-26 DIAGNOSIS — G4733 Obstructive sleep apnea (adult) (pediatric): Secondary | ICD-10-CM | POA: Diagnosis not present

## 2023-01-26 DIAGNOSIS — E785 Hyperlipidemia, unspecified: Secondary | ICD-10-CM | POA: Diagnosis not present

## 2023-01-26 DIAGNOSIS — I4819 Other persistent atrial fibrillation: Secondary | ICD-10-CM

## 2023-01-26 DIAGNOSIS — I10 Essential (primary) hypertension: Secondary | ICD-10-CM | POA: Diagnosis not present

## 2023-01-26 MED ORDER — METOPROLOL TARTRATE 25 MG PO TABS: 25.0000 mg | ORAL_TABLET | Freq: Two times a day (BID) | ORAL | 3 refills | Status: DC

## 2023-01-26 MED ORDER — METOPROLOL SUCCINATE ER 50 MG PO TB24
50.0000 mg | ORAL_TABLET | Freq: Every day | ORAL | 3 refills | Status: DC
Start: 2023-01-26 — End: 2023-03-28

## 2023-01-26 MED ORDER — FLECAINIDE ACETATE 50 MG PO TABS: 50.0000 mg | ORAL_TABLET | Freq: Two times a day (BID) | ORAL | 3 refills | Status: DC

## 2023-01-26 NOTE — Progress Notes (Deleted)
Cardiology Office Note:  .   Date:  01/26/2023  ID:  Evan Robinson, DOB 1945-08-07, MRN [id number] PCP: Genice Rouge, MD  [facility] Providers Cardiologist:  Chilton Si, MD Electrophysiologist:  Lanier Prude, MD { Click to update primary MD,subspecialty MD or APP then REFRESH:1}   History of Present Illness: .   Evan Robinson is a 77 y.o. male with hypertension, HFmrEF, atrial fibrillation/flutter, status post atrial fibrillation ablation 07/2022, OSA, and syncope here for follow-up.  He had an atrial fibrillation ablation 07/2022.  He was seen in the hospital 12/2018 2:04 episodes of syncope.  In the hospital blood pressures were uncharacteristically elevated to the 160s.  Echo at the time revealed LVEF 45-50% and a severely dilated left ventricle.  He had moderate mitral valve prolapse with mild mitral regurgitation.  His syncope was thought to be due to intravascular volume depletion.  He was encouraged to increase his fluid intake.  He was hyponatremic to 128 and on HCTZ.  HCTZ was discontinued.  ROS: ***  Studies Reviewed: .       Echo 12/2022: TTE 12/16/2022  1. LVEF is unchanged compared to images from study donw in 2023 . Left  ventricular ejection fraction, by estimation, is 45 to 50%. The left  ventricle has mildly decreased function. The left ventricular internal  cavity size was severely dilated. Left  ventricular diastolic parameters were normal.   2. Right ventricular systolic function is mildly reduced. The right  ventricular size is mildly enlarged.   3. Mild mitral valve regurgitation. 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   4. The aortic valve is tricuspid. Aortic valve regurgitation is not  visualized. Aortic valve sclerosis/calcification is present, without any  evidence of aortic stenosis.     Risk Assessment/Calculations:   {Does this patient have ATRIAL FIBRILLATION?:[phone]} No BP recorded.  {Refresh Note OR  Click here to enter BP  :1}***       Physical Exam:   VS:  There were no vitals taken for this visit.   Wt Readings from Last 3 Encounters:  12/15/22 190 lb (86.2 kg)  12/07/22 190 lb 10.1 oz (86.5 kg)  11/22/22 189 lb 6.4 oz (85.9 kg)    GEN: Well nourished, well developed in no acute distress NECK: No JVD; No carotid bruits CARDIAC: ***RRR, no murmurs, rubs, gallops RESPIRATORY:  Clear to auscultation without rales, wheezing or rhonchi  ABDOMEN: Soft, non-tender, non-distended EXTREMITIES:  No edema; No deformity   ASSESSMENT AND PLAN: .   ***    {Are you ordering a CV Procedure (e.g. stress test, cath, DCCV, TEE, etc)?   Press F2        :[id number]}  Dispo: ***  Signed, Chilton Si, MD

## 2023-01-26 NOTE — Progress Notes (Signed)
Cardiology Office Note:  .    Date:  01/26/2023  ID:  Evan Robinson, DOB March 12, 1946, MRN [id number] PCP: Genice Rouge, MD  [facility] Providers Cardiologist:  Chilton Si, MD Electrophysiologist:  Lanier Prude, MD     History of Present Illness: .    Evan Robinson is a 76 y.o. male with hypertension, HFmrEF, atrial fibrillation/flutter, status post atrial fibrillation ablation 07/2022, OSA, and syncope here for follow-up.  He had an atrial fibrillation ablation 07/2022.  He was seen in the hospital 12/2018 2:04 episodes of syncope.  In the hospital blood pressures were uncharacteristically elevated to the 160s.  Echo at the time revealed LVEF 45-50% and a severely dilated left ventricle.  He had moderate mitral valve prolapse with mild mitral regurgitation.  His syncope was thought to be due to intravascular volume depletion.  He was encouraged to increase his fluid intake.  He was hyponatremic to 128 and on HCTZ.  HCTZ was discontinued.   Today, he is accompanied by a family member. He states he is feeling better. In the office his blood pressure is 148/80. This morning at home his blood pressure was closer to 128/78. He presents a blood pressure log which is personally reviewed, showing 110s-130s systolic and averaging in the 578I. Currently he is taking 1/2 tablets of losartan for a total of 12.5 mg daily. There have been no further syncopal episodes. However, he reports one severe episode of Afib recently with his heart beating out of his chest. The episode lasted for 3 days, during which he continued to take his flecainide as prescribed. Since his syncopal episodes and hospital visit, he still struggles with low stamina. He is trying to walk 3-4 days a week for several miles and works out in the yard. He has noticed increased exercise intolerance with these activities. No anginal symptoms. Currently on gemfibrozil 600 mg daily for cholesterol management. He denies any chest  pain, peripheral edema, lightheadedness, headaches, orthopnea, or PND.  ROS:  Please see the history of present illness. All other systems are reviewed and negative.  (+) Recent Afib episode with significant palpitations (+) Fatigue (+) Increased exercise intolerance  Studies Reviewed: .        Echo 12/2022: TTE 12/16/2022  1. LVEF is unchanged compared to images from study donw in 2023 . Left  ventricular ejection fraction, by estimation, is 45 to 50%. The left  ventricle has mildly decreased function. The left ventricular internal  cavity size was severely dilated. Left  ventricular diastolic parameters were normal.   2. Right ventricular systolic function is mildly reduced. The right  ventricular size is mildly enlarged.   3. Mild mitral valve regurgitation. 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   4. The aortic valve is tricuspid. Aortic valve regurgitation is not  visualized. Aortic valve sclerosis/calcification is present, without any  evidence of aortic stenosis.   Risk Assessment/Calculations:    CHA2DS2-VASc Score = 4   This indicates a 4.8% annual risk of stroke. The patient's score is based upon: CHF History: 0 HTN History: 1 Diabetes History: 0 Stroke History: 0 Vascular Disease History: 1 Age Score: 2 Gender Score: 0     Physical Exam:    VS:  BP (!) 148/80   Pulse 66   Ht 6' (1.829 m)   Wt 195 lb (88.5 kg)   SpO2 98%   BMI 26.45 kg/m  , BMI Body mass index is 26.45 kg/m. GENERAL:  Well appearing HEENT: Pupils equal round and reactive, fundi not visualized, oral mucosa unremarkable NECK:  No jugular venous distention, waveform within normal limits, carotid upstroke brisk and symmetric, no bruits, no thyromegaly LUNGS:  Clear to auscultation bilaterally HEART:  RRR.  PMI not displaced or sustained,S1 and S2 within normal limits, no S3, no S4, no clicks, no rubs, no murmurs ABD:  Flat, positive bowel sounds normal in frequency  in pitch, no bruits, no rebound, no guarding, no midline pulsatile mass, no hepatomegaly, no splenomegaly EXT:  2 plus pulses throughout, no edema, no cyanosis no clubbing SKIN:  No rashes no nodules NEURO:  Cranial nerves II through XII grossly intact, motor grossly intact throughout PSYCH:  Cognitively intact, oriented to person place and time  Wt Readings from Last 3 Encounters:  01/26/23 195 lb (88.5 kg)  12/15/22 190 lb (86.2 kg)  12/07/22 190 lb 10.1 oz (86.5 kg)     ASSESSMENT AND PLAN: .    # Persistent atrial Fibrillation s/p ablation x2.  Recent severe episode with heart rate up to 130-140. Currently on Flecainide 50mg . -Increase Flecainide to 100mg  during AFib episodes lasting longer than an hour.  # Hypertension Blood pressure controlled on half a Losartan tablet daily. -Continue Losartan at current dose.  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 -Discontinue Diltiazem. -Start Metoprolol 50mg  daily for heart rate control and heart failure management. -Provide Metoprolol 25mg  as needed for additional heart rate control during AFib episodes.  # Coronary Artery Disease Presence of plaque noted on previous CT scan, possible contribution to decreased stamina. -Order CT scan to assess severity of coronary artery blockage. - If he has obstructive CAD, will need to stop flecainide,  Follow-up in 2 months to assess response to medication changes and results of CT scan. Notify provider sooner if blood pressure changes significantly.      Dispo:  FU with [name], MD, [facility] in 2 months.  I,Mathew Stumpf,acting as a Neurosurgeon for Chilton Si, MD.,have documented all relevant documentation on the behalf of Chilton Si, MD,as directed by  Chilton Si, MD while in the presence of Chilton Si, MD.  I, Vernie Vinciguerra C. Duke Salvia, MD have reviewed all  documentation for this visit.  The documentation of the exam, diagnosis, procedures, and orders on 01/26/2023 are all accurate and complete.   Signed, Chilton Si, MD

## 2023-01-26 NOTE — Patient Instructions (Addendum)
Medication Instructions:  Your physician has recommended you make the following change in your medication:  Change: You may take an extra Flecanide as needed when you have episodes of A. Fib that last more than one hour.   Stop: diltiazem   Start: Metoprolol succinate 50mg  daily   Start: Metoprolol Tartrate 25mg  twice daily as needed   *If you need a refill on your cardiac medications before your next appointment, please call your pharmacy*   Lab Work: Your physician recommends that you return for lab work one week prior to CT- for BMP   Testing/Procedures:   Your cardiac CT will be scheduled at one of the below locations:   [facility] [address] [place], [place] [address] [phone]  If scheduled at [facility], please arrive at the [facility] (Entrance C2) of [facility] 30 minutes prior to test start time. You can use the FREE valet parking offered at entrance C (encouraged to control the heart rate for the test)  Proceed to the [facility] Radiology Department (first floor) to check-in and test prep.  All radiology patients and guests should use entrance C2 at [facility], accessed from [facility], even though the hospital's physical address listed is [address]     Please follow these instructions carefully (unless otherwise directed):  An IV will be required for this test and Nitroglycerin will be given.  Hold all erectile dysfunction medications at least 3 days (72 hrs) prior to test. (Ie viagra, cialis, sildenafil, tadalafil, etc)   On the Night Before the Test: Be sure to Drink plenty of water. Do not consume any caffeinated/decaffeinated beverages or chocolate 12 hours prior to your test. Do not take any antihistamines 12 hours prior to your test.  On the Day of the Test: Drink plenty of water until 1 hour prior to the test. Do not eat any food 1 hour prior to test. You  may take your regular medications prior to the test.  Take metoprolol (Lopressor) 100mg  ( 4tablets of your PRN medication) two hours prior to test.      After the Test: Drink plenty of water. After receiving IV contrast, you may experience a mild flushed feeling. This is normal. On occasion, you may experience a mild rash up to 24 hours after the test. This is not dangerous. If this occurs, you can take Benadryl 25 mg and increase your fluid intake. If you experience trouble breathing, this can be serious. If it is severe call 911 IMMEDIATELY. If it is mild, please call our office. If you take any of these medications: Glipizide/Metformin, Avandament, Glucavance, please do not take 48 hours after completing test unless otherwise instructed.  We will call to schedule your test 2-4 weeks out understanding that some insurance companies will need an authorization prior to the service being performed.   For more information and frequently asked questions, please visit our website : [email or web address]  For non-scheduling related questions, please contact the cardiac imaging nurse navigator should you have any questions/concerns: Cardiac Imaging Nurse Navigators Direct Office Dial: [phone]   For scheduling needs, including cancellations and rescheduling, please call [place], [phone].   Follow-Up: At [facility], you and your health needs are our priority.  As part of our continuing mission to provide you with exceptional heart care, we have created designated Provider Care Teams.  These Care Teams include your primary Cardiologist (physician) and Advanced Practice Providers (APPs -  Physician Assistants and Nurse Practitioners) who all work together to provide you with the care you need, when you need it.  We recommend signing up for the patient portal called "MyChart".  Sign up information is provided on this After Visit Summary.  MyChart is used to connect with  patients for Virtual Visits (Telemedicine).  Patients are able to view lab/test results, encounter notes, upcoming appointments, etc.  Non-urgent messages can be sent to your provider as well.   To learn more about what you can do with MyChart, go to [email or web address].    Your next appointment:   2 months with Dr. Duke Salvia or Gillian Shields, NP

## 2023-02-06 ENCOUNTER — Telehealth (HOSPITAL_COMMUNITY): Payer: Self-pay | Admitting: *Deleted

## 2023-02-06 NOTE — Telephone Encounter (Signed)
Received call from patient regarding upcoming cardiac imaging study; pt verbalizes understanding of appt date/time, parking situation and where to check in, pre-test NPO status and medications ordered, and verified current allergies; name and call back number provided for further questions should they arise Johney Frame RN Navigator Cardiac Imaging [facility] [phone] office [phone] cell  Patient reports HR 50-60s at home, patient aware when he is in AFib and has not had any recent Afib episodes.

## 2023-02-06 NOTE — Telephone Encounter (Signed)
Attempted to call patient regarding upcoming cardiac CT appointment. Left message on voicemail with name and callback number Hayley Sharpe RN Navigator Cardiac Imaging [facility] [phone] Office   

## 2023-02-07 ENCOUNTER — Ambulatory Visit (HOSPITAL_COMMUNITY)
Admission: RE | Admit: 2023-02-07 | Discharge: 2023-02-07 | Disposition: A | Discharge status: Home / Self Care | Payer: Medicare PPO | Source: Ambulatory Visit | Attending: Cardiovascular Disease | Admitting: Cardiovascular Disease

## 2023-02-07 DIAGNOSIS — I2510 Atherosclerotic heart disease of native coronary artery without angina pectoris: Secondary | ICD-10-CM | POA: Insufficient documentation

## 2023-02-07 DIAGNOSIS — R931 Abnormal findings on diagnostic imaging of heart and coronary circulation: Secondary | ICD-10-CM | POA: Diagnosis present

## 2023-02-07 MED ORDER — IOHEXOL 350 MG/ML SOLN
95.0000 mL | Freq: Once | INTRAVENOUS | Status: AC | PRN
  Administered 2023-02-07: 95 mL via INTRAVENOUS

## 2023-02-07 MED ORDER — NITROGLYCERIN 0.4 MG SL SUBL
SUBLINGUAL_TABLET | SUBLINGUAL | Status: AC
  Filled 2023-02-07: qty 2

## 2023-02-07 MED ORDER — NITROGLYCERIN 0.4 MG SL SUBL
0.8000 mg | SUBLINGUAL_TABLET | Freq: Once | SUBLINGUAL | Status: AC
  Administered 2023-02-07: 0.8 mg via SUBLINGUAL

## 2023-02-09 ENCOUNTER — Ambulatory Visit (HOSPITAL_COMMUNITY)
Admission: RE | Admit: 2023-02-09 | Discharge: 2023-02-09 | Disposition: A | Discharge status: Home / Self Care | Payer: Medicare PPO | Source: Ambulatory Visit | Attending: Cardiology | Admitting: Cardiology

## 2023-02-09 ENCOUNTER — Other Ambulatory Visit: Payer: Self-pay | Admitting: Cardiology

## 2023-02-09 DIAGNOSIS — R931 Abnormal findings on diagnostic imaging of heart and coronary circulation: Secondary | ICD-10-CM

## 2023-02-09 DIAGNOSIS — I2510 Atherosclerotic heart disease of native coronary artery without angina pectoris: Secondary | ICD-10-CM | POA: Diagnosis not present

## 2023-02-16 ENCOUNTER — Encounter: Payer: Self-pay | Admitting: Physical Medicine and Rehabilitation

## 2023-02-16 ENCOUNTER — Encounter: Payer: Medicare PPO | Attending: Physical Medicine and Rehabilitation | Admitting: Physical Medicine and Rehabilitation

## 2023-02-16 VITALS — BP 144/76 | HR 57 | Ht 72.0 in | Wt 193.0 lb

## 2023-02-16 DIAGNOSIS — M792 Neuralgia and neuritis, unspecified: Secondary | ICD-10-CM

## 2023-02-16 DIAGNOSIS — I10 Essential (primary) hypertension: Secondary | ICD-10-CM | POA: Diagnosis not present

## 2023-02-16 MED ORDER — CAPSAICIN-CLEANSING GEL 8 % EX KIT
2.0000 | PACK | Freq: Once | CUTANEOUS | Status: AC
Start: 2023-02-16 — End: 2023-02-16
  Administered 2023-02-16: 2 via TOPICAL

## 2023-02-20 NOTE — Progress Notes (Signed)
Subjective:    Patient ID: Evan Robinson, male    DOB: 12-31-1945, 77 y.o.   MRN: [id number]   HPI:  1) Neuropathy:  Evan Robinson is a 77 y.o. male who presents for f/u of facial pain, shoulder pain, and capelike distribution of upper back and neck pain.  -has had ongoing benefits from Qutenza -ready to try again today -does not need medication refills -has been able to enjoy vacations with his wife.  -we previously weaned lyrica to 75mg  -He reports he has facial pain and neck pain radiating into his bilateral shoulders  -his pain is much better controlled since we have started applying the Qutenza patches -he has felt that these and the turmeric have been most helpful for his pain -he does feel burning and tingling during the first couple of days after application but this dissapates shortly afterward -he requests ice packs to be applied immediately after application -he would like to repeat Qutenza today -he would like to try ice packs with application -he does not need refill of lyrica today -he had a good trip with his wife to [place] recently -he plans to travel with her more, moving and staying busy helps his pain He current has no sunburns -he has plans to travel this summer with his wife -he maintains on his prior medications and does not need any further refills today He has burning in left sided facial pain and shoulder It is worst with friction and exacerbated with lying down.  He has been on the Lyrica for years. He is currently 100mg  BID and he is able to tolerate this dose well. He had dizziness with higher doses He has not had cognitive side effects He did well with the memory recall.  He has never tried CBD oil.  Pain worsens with shaving He tried aloe vera without great benefit He is excited to try Qutenza today- it gave him about 1 month of relief last visit.  He is not sure if he has tried carbamazepine Capsaicin made his skin burn and raw in the past.   -He is interested in trying medical marijuana if it gets approved.  -He asks for a refill of Lyrica -He is ready for Qutenza today -Had a good Christmas and New Year's Family  -he does have some dizziness -had too many side effects from gabapentin  2) Impaired balance:  -would like to try topamax instead of Lyrica as decreasing the Lyrica improved his balance  3) HTN -BP is 144/76 today    Pain Inventory Average Pain 8 Pain Right Now 7 My pain is burning  In the last 24 hours, has pain interfered with the following? General activity 7 Relation with others 8 Enjoyment of life 7 What TIME of day is your pain at its worst? evening and night Sleep (in general) Fair  Pain is worse with: sitting Pain improves with: medication Relief from Meds: 4  Family History  Problem Relation Age of Onset   Alcohol abuse Father    Cancer Father        prostate   CAD Father        possible MI   Alcohol abuse Paternal Grandfather    Stroke Mother    Diabetes Neg Hx    Social History   Socioeconomic History   Marital status: Married    Spouse name: Not on file   Number of children: Not on file   Years of education: Not on file   Highest education  level: Not on file  Occupational History   Not on file  Tobacco Use   Smoking status: Never   Smokeless tobacco: Never  Vaping Use   Vaping status: Never Used  Substance and Sexual Activity   Alcohol use: No   Drug use: No   Sexual activity: Not on file  Other Topics Concern   Not on file  Social History Narrative   "Gene"   Lives with wife   Occupation: retired, was Dietitian   Activity: walks daily, some gym   Diet:    Social Determinants of Corporate investment banker Strain: Not on file  Food Insecurity: Low Risk  (01/03/2023)   Received from [facility]   Hunger Vital Sign    Worried About Running Out of Food in the Last Year: Never true    Ran Out of Food in the Last Year: Never true  Transportation Needs: No  Transportation Needs (01/03/2023)   Received from [facility]    In the past 12 months, has lack of reliable transportation kept you from medical appointments, meetings, work or from getting things needed for daily living? : No  Physical Activity: Not on file  Stress: Not on file  Social Connections: Not on file   Past Surgical History:  Procedure Laterality Date   ATRIAL FIBRILLATION ABLATION N/A 07/30/2021   Procedure: ATRIAL FIBRILLATION ABLATION;  Surgeon: Lanier Prude, MD;  Location: [facility] INVASIVE CV LAB;  Service: Cardiovascular;  Laterality: N/A;   ATRIAL FIBRILLATION ABLATION N/A 10/25/2022   Procedure: ATRIAL FIBRILLATION ABLATION;  Surgeon: Lanier Prude, MD;  Location: [facility] INVASIVE CV LAB;  Service: Cardiovascular;  Laterality: N/A;   CHOLECYSTECTOMY  2006   COLONOSCOPY WITH PROPOFOL N/A 02/01/2017   Procedure: COLONOSCOPY WITH PROPOFOL;  Surgeon: Toledo, Boykin Nearing, MD;  Location: [facility] ENDOSCOPY;  Service: Gastroenterology;  Laterality: N/A;   ESOPHAGOGASTRODUODENOSCOPY (EGD) WITH PROPOFOL N/A 02/01/2017   Procedure: ESOPHAGOGASTRODUODENOSCOPY (EGD) WITH PROPOFOL;  Surgeon: Toledo, Boykin Nearing, MD;  Location: [facility] ENDOSCOPY;  Service: Gastroenterology;  Laterality: N/A;   HERNIA REPAIR  08/2010   PROSTATECTOMY  2003   Past Surgical History:  Procedure Laterality Date   ATRIAL FIBRILLATION ABLATION N/A 07/30/2021   Procedure: ATRIAL FIBRILLATION ABLATION;  Surgeon: Lanier Prude, MD;  Location: [facility] INVASIVE CV LAB;  Service: Cardiovascular;  Laterality: N/A;   ATRIAL FIBRILLATION ABLATION N/A 10/25/2022   Procedure: ATRIAL FIBRILLATION ABLATION;  Surgeon: Lanier Prude, MD;  Location: [facility] INVASIVE CV LAB;  Service: Cardiovascular;  Laterality: N/A;   CHOLECYSTECTOMY  2006   COLONOSCOPY WITH PROPOFOL N/A 02/01/2017   Procedure: COLONOSCOPY WITH PROPOFOL;  Surgeon: Toledo, Boykin Nearing, MD;  Location: [facility] ENDOSCOPY;  Service: Gastroenterology;  Laterality:  N/A;   ESOPHAGOGASTRODUODENOSCOPY (EGD) WITH PROPOFOL N/A 02/01/2017   Procedure: ESOPHAGOGASTRODUODENOSCOPY (EGD) WITH PROPOFOL;  Surgeon: Toledo, Boykin Nearing, MD;  Location: [facility] ENDOSCOPY;  Service: Gastroenterology;  Laterality: N/A;   HERNIA REPAIR  08/2010   PROSTATECTOMY  2003   Past Medical History:  Diagnosis Date   Dyslipidemia    H/O prostate cancer 05/02/2001   s/p radical prostatectomy (uro Dr. Rayburn Ma in [place] yearly)   Headache 05/02/2010   thorough w/u - unrevealing.  has seen 3 neurologists, MRI, LP nonacute.  ESR = 3   HTN (hypertension)    Hypogonadism male    Low testosterone    Migraines    OSA on CPAP    Persistent atrial fibrillation (HCC) 06/28/2022  Renal cyst 12/01/2010   complex cyst upper pole L kidney with no malignancy by MRI   Vertigo    Vitamin D deficiency    BP (!) 144/76   Pulse (!) 57   Ht 6' (1.829 m)   Wt 193 lb (87.5 kg)   SpO2 97%   BMI 26.18 kg/m   Opioid Risk Score:   Fall Risk Score:  `1  Depression screen [facility] 2/9     02/16/2023    9:32 AM 08/15/2022    9:52 AM 02/07/2022   10:16 AM 11/15/2021    1:51 PM 08/26/2021    9:30 AM 06/01/2021    9:08 AM 02/12/2021   11:14 AM  Depression screen PHQ 2/9  Decreased Interest 0 0 0 0 0 0 0  Down, Depressed, Hopeless 0 0 0 0 0 0 0  PHQ - 2 Score 0 0 0 0 0 0 0      Review of Systems  Constitutional: Negative.   HENT: Negative.    Eyes: Negative.   Respiratory: Negative.    Cardiovascular: Negative.   Gastrointestinal: Negative.   Endocrine: Negative.   Musculoskeletal:  Positive for neck pain.       Nerve pain , pain in shoulders ,   Skin: Negative.   Allergic/Immunologic: Negative.   Neurological: Negative.   Hematological: Negative.   Psychiatric/Behavioral: Negative.         Objective:   Physical Exam Vitals and nursing note reviewed. BMI 26.18, BP 144/76, weight 193 lbs Constitutional:      Appearance: Normal appearance.  Cardiovascular:     Rate and Rhythm:  Normal rate and regular rhythm.     Pulses: Normal pulses.     Heart sounds: Normal heart sounds.  Pulmonary:     Effort: Pulmonary effort is normal.     Breath sounds: Normal breath sounds.  Musculoskeletal:     Cervical back: Normal range of motion and neck supple.     Comments: Normal Muscle Bulk and Muscle Testing Reveals:  Upper Extremities: Full ROM and Muscle Strength  5/5 Lower Extremities: Full ROM and Muscle Strength 5/5 Arises from chair with ease Narrow Based  Gait  Tight cervical myofascia Forward protracted posture, exam stable 10/21 Skin:    General: Skin is warm and dry. No open lesions over back or shoulders. No sunburn, erythema after treatment Neurological:     Mental Status: He is alert and oriented to person, place, and time.  Psychiatric:        Mood and Affect: Mood normal.        Behavior: Behavior normal.         Assessment & Plan:   1. 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             Lidoderm patch/ointment, Zyrtec, Tomapax, Almond milk, Elavil, Ketamine infusion (cound not tolerate), hypotherapy, compound medication, hypnosis without benefit             Unable to trial carbamazepine as patient is on Eliquis             Steroids made symptoms more severe  Side effects with Cymbalta, oxcarbazepine, Pamelor             CompletedPhysical Therapy two days a week. Continue HEP            Decrease Lyrica to 100/100/75, add topamax 25mg  in the evening Pain. Continue HEP as Tolerated and Continue to Monitor.   Turmeric to reduce inflammation--can be used in cooking or taken as a supplement.  Benefits of turmeric:  -Highly anti-inflammatory  -Increases  antioxidants  -Improves memory, attention, brain disease  -Lowers risk of heart disease  -May help prevent cancer  -Decreases pain  -Alleviates depression  -Delays aging and decreases risk of chronic disease  -Consume with black pepper to increase absorption    Turmeric Milk Recipe:  1 cup milk  1 tsp turmeric  1 tsp cinnamon  1 tsp grated ginger (optional)  Black pepper (boosts the anti-inflammatory properties of turmeric).  1 tsp honey   Recommend topical CBD oil- discussed its benefits in reducing inflammation, pain, insomnia, and anxiety.  -Discussed that CBD oil differs from marijuana in that it does not contain THC- the substance that causes euphoria.  -Discussed that it is made from the hemp plant.  -It has been used for thousands of years Prescribed Zynex Nexwave, cervical traction device, and heating/cooling blanket  -preliminary research suggests that if may be able to shrink cancerous tumors, stop plaque formation in Alzheimer's Disease, and slow the progress of brain disease from concussions.  -Additional benefits that have been demonstrated in studies include improved nausea, indigestion, and brain health, and reduced seizures.  -In a survey 92% of patients who tried medical cannabis felt it improved symptoms such as chronic pain, arthritis, migraines, and cancer.    -recommended certain CBD brand  -discussed procedures that could potentially help, but could also make the pain worse.   -Provided with a pain relief journal and discussed that it contains foods and lifestyle tips to naturally help to improve pain. Discussed that these lifestyle strategies are also very good for health unlike some medications which can have negative side effects. Discussed that the act of keeping a journal can be therapeutic and helpful to realize patterns what helps to trigger and alleviate pain.    -continue B complex supplement.   -Discussed Qutenza as an option for neuropathic  pain control. Discussed that this is a capsaicin patch, stronger than capsaicin cream. Discussed that it is currently approved for diabetic peripheral neuropathy and post-herpetic neuralgia, but that it has also shown benefit in treating other forms of neuropathy. Provided patient with link to site to learn more about the patch: https://www.clark.biz/. Discussed that the patch would be placed in office and benefits usually last 3 months. Discussed that unintended exposure to capsaicin can cause severe irritation of eyes, mucous membranes, respiratory tract, and skin, but that Qutenza is a local treatment and does not have the systemic side effects of other nerve medications. Discussed that there may be pain, itching, erythema, and decreased sensory function associated with the application of Qutenza. Side effects usually subside within 1 week. A cold pack of analgesic medications can help with these side effects. Blood pressure can also be increased due to pain associated with administration of the patch.   2 patches of Qutenza was applied to the area of pain. Ice packs were applied during the procedure to ensure patient comfort. Blood pressure was monitored every 15 minutes. The patient tolerated the procedure well. Post-procedure instructions were given and follow-up has been scheduled.  2, Chronic Pain Syndrome:  Continue Lyrica, d/c Baclofen 5 mg QHS. Continue to Monitor. No reports of drowsiness. Continue to monitor.  -Discussed benefits of exercise in reducing pain. -prescribed Zynex cervical traction device and heating blanket -Discussed following foods that may reduce pain: 1) Ginger (especially studied for arthritis)- reduce leukotriene production to decrease inflammation 2) Blueberries- high in phytonutrients that decrease inflammation 3) Salmon- marine omega-3s reduce joint swelling and pain 4) Pumpkin seeds- reduce inflammation 5) dark chocolate- reduces inflammation 6) turmeric- reduces  inflammation 7) tart cherries - reduce pain and stiffness 8) extra virgin olive oil - its compound olecanthal helps to block prostaglandins  9) chili peppers- can be eaten or applied topically via capsaicin 10) mint- helpful for headache, muscle aches, joint pain, and itching 11) garlic- reduces inflammation  Link to further information on diet for chronic pain: http://www.bray.com/   3. Impaired balance: affected by Lyrica. Decrease Lyrica to 100/100/75, add topamax 25mg  HS  4. Hypersensitive skin: discussed that he may feel increased pain from Qutenza due to this.   5. Insomnia: Refilled Lyrica -Try to go outside near sunrise -Get exercise during the day.  -Turn off all devices an hour before bedtime.  -Teas that can benefit: chamomile, valerian root, Brahmi (Bacopa) -Can consider over the counter melatonin, magnesium, and/or L-theanine. Melatonin is an anti-oxidant with multiple health benefits. Magnesium is involved in greater than 300 enzymatic reactions in the body and most of [place] are deficient as our soil is often depleted. There are 7 different types of magnesium- Bioptemizer's is a supplement with all 7 types, and each has unique benefits. Magnesium can also help with constipation and anxiety.  -Pistachios naturally increase the production of melatonin -Cozy Earth bamboo bed sheets are free from toxic chemicals.  -Tart cherry juice or a tart cherry supplement can improve sleep and soreness post-workout   6. Overweight BMI 25.90, weight 191 lbs -discussed that capsaicin has been associated with weight loss -commended on weight loss! -prescribed topamax 25mg  HS  7. HTN: -BP is 144/76 today.  -Advised regarding healthy foods that can help lower blood pressure and provided with a list: 1) citrus foods- high in vitamins and minerals 2) salmon and other fatty fish - reduces inflammation and oxylipins 3) swiss  chard (leafy green)- high level of nitrates 4) pumpkin seeds- one of the best natural sources of magnesium 5) Beans and lentils- high in fiber, magnesium, and potassium 6) Berries- high in flavonoids 7) Amaranth (whole grain, can be cooked similarly to rice and oats)- high in magnesium and fiber 8) Pistachios- even more effective at reducing BP than other nuts 9) Carrots- high in phenolic compounds that relax blood vessels and reduce inflammation 10) Celery- contain phthalides that relax tissues of arterial walls 11) Tomatoes- can also improve cholesterol and reduce risk of heart disease 12) Broccoli- good source of magnesium, calcium, and potassium 13) Greek yogurt: high in potassium and calcium 14) Herbs and spices: Celery seed, cilantro, saffron, lemongrass, black cumin, ginseng, cinnamon, cardamom, sweet basil, and ginger 15) Chia and flax seeds- also help to lower cholesterol and blood sugar 16) Beets- high levels of nitrates that relax blood vessels  17) spinach and bananas- high in potassium  -Provided lise of supplements that can help with hypertension:  1) magnesium: one high quality brand is Bioptemizers since it contains all 7 types of magnesium, otherwise over the counter magnesium gluconate 400mg  is a good option 2) B vitamins 3) vitamin D 4) potassium 5) CoQ10 6)  L-arginine 7) Vitamin C 8) Beetroot -Educated that goal BP is 120/80. -Made goal to incorporate some of the above foods into diet.

## 2023-02-28 NOTE — Progress Notes (Unsigned)
Electrophysiology Office Follow up Visit Note:    Date:  03/01/2023   ID:  Evan Robinson, DOB 1945/11/21, MRN [id number]  PCP:  Genice Rouge, MD  [facility] Cardiologist:  Chilton Si, MD  [facility] Electrophysiologist:  Lanier Prude, MD    Interval History:     Evan Robinson is a 77 y.o. male who presents for a follow up visit.   Discussed the use of AI scribe software for clinical note transcription with the patient, who gave verbal consent to proceed.  History of Present Illness   Evan Robinson, a 77 year old man with a history of persistent atrial fibrillation, hypertension, and coronary artery disease, presents for follow-up. He underwent catheter ablation for atrial fibrillation on October 25, 2022, during which the right inferior pulmonary vein required re-isolation, and the posterior wall and CTI for typical flutter were ablated. An echocardiogram in August 2024 showed an EF of 45-50%, and a heart monitor did not show any atrial fibrillation. However, he reported an episode of atrial fibrillation with elevated heart rates up to 130-140 beats per minute in September 2024. His flecainide was increased, but a subsequent CT scan showed significant coronary artery disease.  In addition to his cardiac issues, Evan Robinson has experienced episodes of syncope. The first episode occurred at home after a 3-mile walk, during which he lost consciousness and fell. His blood pressure was noted to be low at the time. A second episode occurred in a store, where he felt strange and had an urge to escape. He has not had any episodes since these incidents.            Past medical, surgical, social and family history were reviewed.  ROS:   Please see the history of present illness.    All other systems reviewed and are negative.  EKGs/Labs/Other Studies Reviewed:    The following studies were reviewed today:  EKG Interpretation Date/Time:  Wednesday March 01 2023 13:34:42  EDT Ventricular Rate:  51 PR Interval:  190 QRS Duration:  116 QT Interval:  444 QTC Calculation: 409 R Axis:   -58  Text Interpretation: Sinus bradycardia with Premature atrial complexes in a pattern of bigeminy Confirmed by Steffanie Dunn [phone]) on 03/01/2023 1:50:59 PM    Physical Exam:    VS:  BP (!) 144/78   Pulse (!) 51   Ht 6' (1.829 m)   Wt 193 lb 9.6 oz (87.8 kg)   SpO2 98%   BMI 26.26 kg/m     Wt Readings from Last 3 Encounters:  03/01/23 193 lb 9.6 oz (87.8 kg)  02/16/23 193 lb (87.5 kg)  01/26/23 195 lb (88.5 kg)     Physical Exam    CHEST: Lungs clear to auscultation. CARDIOVASCULAR: Regular rate and rhythm.           ASSESSMENT:    1. Primary hypertension   2. Persistent atrial fibrillation (HCC)   3. Encounter for long-term (current) use of high-risk medication    PLAN:    In order of problems listed above:  Assessment and Plan    Atrial Fibrillation Persistent symptomatic episodes despite multiple prior catheter ablations. Recent episode of atrial fibrillation with elevated heart rates up to 130-140 bpm. Flecainide is no longer appropriate due to coronary artery disease observed on recent CT coronary.  -Stop Flecainide. -Continue anticoagulant. -Return in 6 months for follow-up or sooner if breakthrough episodes of Afib occur. - consider tikosyn if recurrence off flecainide  Coronary Artery Disease Significant  coronary artery disease observed on recent CT coronary. -Stop Flecainide due to coronary artery disease. -Continue with current management.  Hypertension Currently managed with Diltiazem, Losartan, and Metoprolol. -Continue Diltiazem and Metoprolol. -Stop Losartan as advised in the hospital.  Syncope Recent episode of syncope. Likely vasovagal syncope based on the description of the event and low blood pressure recorded during the episode. -Educated on recognizing warning signs and immediate response (sit or lie  down). -Continue current management and monitor for further episodes.  Follow-up in 6 months or sooner if breakthrough episodes of Afib occur.               Signed, Steffanie Dunn, MD, [facility], [facility] 03/01/2023 2:18 PM    Electrophysiology [facility]

## 2023-03-01 ENCOUNTER — Encounter: Payer: Self-pay | Admitting: Cardiology

## 2023-03-01 ENCOUNTER — Ambulatory Visit: Payer: Medicare PPO | Attending: Cardiology | Admitting: Cardiology

## 2023-03-01 VITALS — BP 144/78 | HR 51 | Ht 72.0 in | Wt 193.6 lb

## 2023-03-01 DIAGNOSIS — I4819 Other persistent atrial fibrillation: Secondary | ICD-10-CM

## 2023-03-01 DIAGNOSIS — I10 Essential (primary) hypertension: Secondary | ICD-10-CM

## 2023-03-01 DIAGNOSIS — Z79899 Other long term (current) drug therapy: Secondary | ICD-10-CM | POA: Diagnosis not present

## 2023-03-01 NOTE — Patient Instructions (Signed)
Medication Instructions:  Your physician has recommended you make the following change in your medication:  1) STOP taking flecainide   *If you need a refill on your cardiac medications before your next appointment, please call your pharmacy*  Follow-Up: At [facility], you and your health needs are our priority.  As part of our continuing mission to provide you with exceptional heart care, we have created designated Provider Care Teams.  These Care Teams include your primary Cardiologist (physician) and Advanced Practice Providers (APPs -  Physician Assistants and Nurse Practitioners) who all work together to provide you with the care you need, when you need it.  Your next appointment:   6 months  Provider:   You will see one of the following Advanced Practice Providers on your designated Care Team:   Francis Dowse, Charlott Holler [address] [place], [place] Sherie Don, NP Canary Brim, NP

## 2023-03-07 ENCOUNTER — Encounter (HOSPITAL_BASED_OUTPATIENT_CLINIC_OR_DEPARTMENT_OTHER): Payer: Self-pay

## 2023-03-07 NOTE — Telephone Encounter (Signed)
Please review and advise, Evan Robinson patient scheduled to see you later in the month

## 2023-03-07 NOTE — Telephone Encounter (Signed)
Metoprolol helps to lower blood pressure but also to prevent recurrent atrial fibrillation. Would recommend reducing Metoprolol Succinate to 25mg  (half tablet) and taking every evening. Concern is that if we stop it completely could have more recurrent atrial fibrillation. Recommend trying reduced dose prior to stopping completely.  As BP is running high, can resume Losartan at dose of 12.5mg  (half tablet) daily. If he will report back BP and HR readings in one week that would be helpful.   Alver Sorrow, NP

## 2023-03-28 ENCOUNTER — Ambulatory Visit (HOSPITAL_BASED_OUTPATIENT_CLINIC_OR_DEPARTMENT_OTHER): Payer: Medicare PPO | Admitting: Family

## 2023-03-28 ENCOUNTER — Encounter (HOSPITAL_BASED_OUTPATIENT_CLINIC_OR_DEPARTMENT_OTHER): Payer: Self-pay | Admitting: Family

## 2023-03-28 VITALS — BP 138/73 | HR 83 | Ht 72.0 in | Wt 195.8 lb

## 2023-03-28 DIAGNOSIS — I4819 Other persistent atrial fibrillation: Secondary | ICD-10-CM

## 2023-03-28 DIAGNOSIS — D6859 Other primary thrombophilia: Secondary | ICD-10-CM

## 2023-03-28 DIAGNOSIS — I25118 Atherosclerotic heart disease of native coronary artery with other forms of angina pectoris: Secondary | ICD-10-CM

## 2023-03-28 DIAGNOSIS — I5022 Chronic systolic (congestive) heart failure: Secondary | ICD-10-CM

## 2023-03-28 DIAGNOSIS — I10 Essential (primary) hypertension: Secondary | ICD-10-CM | POA: Diagnosis not present

## 2023-03-28 DIAGNOSIS — E785 Hyperlipidemia, unspecified: Secondary | ICD-10-CM

## 2023-03-28 MED ORDER — LOSARTAN POTASSIUM 25 MG PO TABS: 25.0000 mg | ORAL_TABLET | Freq: Every day | ORAL | Status: DC

## 2023-03-28 MED ORDER — NEBIVOLOL HCL 2.5 MG PO TABS: 2.5000 mg | ORAL_TABLET | Freq: Every day | ORAL | 0 refills | Status: DC

## 2023-03-28 NOTE — Progress Notes (Signed)
Cardiology Office Note:  .   Date:  03/28/2023  ID:  Evan Robinson, DOB [birth year], MRN [id number] PCP: Genice Rouge, MD  [facility] Providers Cardiologist:  Chilton Si, MD Electrophysiologist:  Lanier Prude, MD    History of Present Illness: .   Evan Robinson is a 77 y.o. male with history of hypertension, HFmrEF, atrial fibrillation/flutter s/p ablation X2, OSA, syncope, MVP.  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   01/26/23 Diltiazem stopped and Metoprolol started. Cardiac CTA with moderate CAD but no obstructive disease. Due to CAD, Flecainide stopped by Dr. Lalla Brothers 03/01/23. Plan to consider Tikosyn if recurrent atrial fib. He sent Mychart message noting irregular BP, low energy, dizziness, headaches and wished to stop Metoprolol. It was reduced to 25mg  daily. Losartan was resumed at 12.5mg  daily due to elevated BP.   Presents today for follow up with his wife. Has had laryngitis with loss of voice for a few days and treating with OTC agents. Has been cutting Metoprolol in half. He still feels poorly. Feels it is affecting balance. This is unchanged since reducing dose. He is presently taking whole table of Losartan with BP at home 142/77, 130/76, 143/94, 133/88, 115/80, 121/80. Since stopping Flecainide he notes he is having more episodes of atrial fibrillation per his Apple Watch. He also attributes this change to stopping Diltiazem, Flecainide.  ROS: Please see the history of present illness.    All other systems reviewed and are negative.   Studies Reviewed: .        Cardiac Studies & Procedures       ECHOCARDIOGRAM  ECHOCARDIOGRAM COMPLETE 12/16/2022  Narrative ECHOCARDIOGRAM REPORT    Patient Name:   Evan Robinson Date of Exam: 12/16/2022 Medical Rec #:  [id number]     Height:       72.0 in Accession #:    [id number]    Weight:        190.0 lb Date of Birth:  1945/06/02     BSA:          2.085 m Patient Age:    76 years      BP:           147/73 mmHg Patient Gender: M             HR:           63 bpm. Exam Location:  Inpatient  Procedure: 2D Echo, Cardiac Doppler and Color Doppler  Indications:    Syncope R55  History:        Patient has prior history of Echocardiogram examinations, most recent 10/25/2022.  Sonographer:    Harriette Bouillon RDCS Referring Phys: [phone] WHITNEY PLUNKETT  IMPRESSIONS   1. LVEF is unchanged compared to images from study donw in 2023 . Left ventricular ejection fraction, by estimation, is 45 to 50%. The left ventricle has mildly decreased function. The left ventricular internal cavity size was severely dilated. Left ventricular diastolic parameters were normal. 2. Right ventricular systolic function is mildly reduced. The right ventricular size is mildly enlarged. 3. Mild mitral valve regurgitation. 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 4. The aortic valve is tricuspid. Aortic valve regurgitation is not visualized. Aortic valve sclerosis/calcification is present, without any evidence of aortic stenosis.  FINDINGS Left Ventricle: LVEF is unchanged compared to images from study donw in 2023. Left ventricular ejection  fraction, by estimation, is 45 to 50%. The left ventricle has mildly decreased function. The left ventricular internal cavity size was severely dilated. There is no left ventricular hypertrophy. Left ventricular diastolic parameters were normal.  Right Ventricle: The right ventricular size is mildly enlarged. Right vetricular wall thickness was not assessed. Right ventricular systolic function is mildly reduced.  Left Atrium: Left atrial size was normal in size.  Right Atrium: Right atrial size was normal in size.  Pericardium: There is no evidence of pericardial effusion.  Mitral Valve: There is moderate prolapse of both leaflets of the mitral  valve. There is mild thickening of the mitral valve leaflet(s). Mild mitral valve regurgitation.  Tricuspid Valve: The tricuspid valve is normal in structure. Tricuspid valve regurgitation is mild.  Aortic Valve: The aortic valve is tricuspid. Aortic valve regurgitation is not visualized. Aortic valve sclerosis/calcification is present, without any evidence of aortic stenosis.  Pulmonic Valve: The pulmonic valve was normal in structure. Pulmonic valve regurgitation is trivial.  Aorta: The aortic root and ascending aorta are structurally normal, with no evidence of dilitation.  IAS/Shunts: No atrial level shunt detected by color flow Doppler.   LEFT VENTRICLE PLAX 2D LVIDd:         6.40 cm   Diastology LVIDs:         4.20 cm   LV e' medial:    6.74 cm/s LV PW:         0.80 cm   LV E/e' medial:  9.4 LV IVS:        0.70 cm   LV e' lateral:   10.20 cm/s LVOT diam:     2.10 cm   LV E/e' lateral: 6.2 LV SV:         58 LV SV Index:   28 LVOT Area:     3.46 cm   RIGHT VENTRICLE TAPSE (M-mode): 3.6 cm  LEFT ATRIUM             Index        RIGHT ATRIUM           Index LA diam:        4.10 cm 1.97 cm/m   RA Area:     15.20 cm LA Vol (A2C):   74.5 ml 35.74 ml/m  RA Volume:   38.40 ml  18.42 ml/m LA Vol (A4C):   47.5 ml 22.79 ml/m LA Biplane Vol: 62.6 ml 30.03 ml/m AORTIC VALVE             PULMONIC VALVE LVOT Vmax:   80.60 cm/s  PR End Diast Vel: 5.11 msec LVOT Vmean:  51.800 cm/s LVOT VTI:    0.167 m  AORTA Ao Root diam: 4.00 cm Ao Asc diam:  3.50 cm  MITRAL VALVE               TRICUSPID VALVE MV Area (PHT): 2.69 cm    TR Peak grad:   38.7 mmHg MV Decel Time: 282 msec    TR Vmax:        311.00 cm/s MV E velocity: 63.30 cm/s MV A velocity: 48.70 cm/s  SHUNTS MV E/A ratio:  1.30        Systemic VTI:  0.17 m Systemic Diam: 2.10 cm  [name] MD Electronically signed by [name] MD Signature Date/Time: 12/16/2022/12:45:22 PM    Final    MONITORS  LONG TERM  MONITOR (3-14 DAYS) 12/20/2022  Narrative HR 42 - 133, average 58 bpm. 3 nonsustained  SVT, longest 5 beats. Rare supraventricular and ventricular ectopy. No sustained arrhythmias. No atrial fibrillation.  Sheria Lang T. Lalla Brothers, MD, [facility], [facility] Cardiac Electrophysiology   CT SCANS  CT CORONARY MORPH W/CTA COR W/SCORE 02/07/2023  Addendum 03/04/2023 12:46 PM ADDENDUM REPORT: 03/04/2023 12:44  EXAM: OVER-READ INTERPRETATION  CT CHEST  The following report is an over-read performed by radiologist Dr. Narda Rutherford of [facility] on 03/04/2023. This over-read does not include interpretation of cardiac or coronary anatomy or pathology. The coronary CTA interpretation by the cardiologist is attached.  COMPARISON:  Cardiac CT 10/18/2022  FINDINGS: Vascular: Aortic atherosclerosis. The included aorta is normal in caliber.  Mediastinum/nodes: No adenopathy or mass. Patulous distal esophagus.  Lungs: Elevated right hemidiaphragm with adjacent atelectasis. Bibasilar bronchiectasis. No pulmonary nodule. No pleural fluid. The included airways are patent.  Upper abdomen: No acute findings. Cholecystectomy. Subcentimeter hypodensity in the right lobe of the liver is too small to characterize but likely small cyst.  Musculoskeletal: There are no acute or suspicious osseous abnormalities. Thoracic spondylosis with anterior spurring.  IMPRESSION: 1. No acute extracardiac findings. 2. Aortic Atherosclerosis (ICD10-I70.0). 3. Elevated right hemidiaphragm with adjacent atelectasis. Chronic bibasilar bronchiectasis.   Electronically Signed By: Narda Rutherford M.D. On: 03/04/2023 12:44  Narrative CLINICAL DATA:  Chest pain  EXAM: Cardiac/Coronary CTA  TECHNIQUE: A non-contrast, gated CT scan was obtained with axial slices of 3 mm through the heart for calcium scoring. Calcium scoring was performed using the Agatston method. A 120 kV prospective, gated,  contrast cardiac scan was obtained. Gantry rotation speed was 250 msecs and collimation was 0.6 mm. Two sublingual nitroglycerin tablets (0.8 mg) were given. The 3D data set was reconstructed in 5% intervals of the 35-75% of the R-R cycle. Diastolic phases were analyzed on a dedicated workstation using MPR, MIP, and VRT modes. The patient received 95 cc of contrast.  FINDINGS: Image quality: Good.  Moderate blooming artifact.  Noise artifact is: Limited.  Coronary Arteries:  Normal coronary origin.  Right dominance.  Left main: The left main is a large caliber vessel with a normal take off from the left coronary cusp that trifurcates into a LAD, LCX, and ramus intermedius. There is mild calcified plaque in the distal LM with associated stenosis of 25-49%.  Left anterior descending artery: The LAD gives off 2 patent diagonal branches. There is diffuse moderate calcified plaque in the proximal LAD with associated stenosis of 50-69%.  Ramus intermedius: Patent and bifurcates into 2 large sub branches with no evidence of plaque or stenosis.  Left circumflex artery: The LCX is non-dominant and patent with no evidence of plaque or stenosis. The LCX gives off 2 patent obtuse marginal branches.  Right coronary artery: The RCA is dominant with normal take off from the right coronary cusp. There is no evidence of plaque or stenosis. The RCA terminates as a PDA and right posterolateral branch without evidence of plaque or stenosis.  Right Atrium: Right atrial size is within normal limits.  Right Ventricle: The right ventricular cavity is within normal limits.  Left Atrium: Left atrial size is normal in size with no left atrial appendage filling defect.  Left Ventricle: The ventricular cavity size is within normal limits.  Pulmonary arteries: Normal in size.  Pulmonary veins: Normal pulmonary venous drainage.  Pericardium: Normal thickness without significant effusion  or calcium present.  Cardiac valves: The aortic valve is trileaflet without significant calcification. The mitral valve is normal without significant calcification.  Aorta: Normal caliber without significant  disease.  Extra-cardiac findings: See attached radiology report for non-cardiac structures.  IMPRESSION: 1. Coronary calcium score of 698. This was 66th percentile for age-, sex, and race-matched controls.  2. Total plaque volume 464 mm3 which is 34th percentile for age- and sex-matched controls (calcified plaque 1107mm3; non-calcified plaque 339mm3). TPV is severe.  3. Normal coronary origin with right dominance.  4. Moderate atherosclerosis.  50-69% proximal LAD.  5. Consider symptom-guided anti-ischemic and preventive pharmacotherapy as well as risk factor modification per guideline directed care.  6. This study has been submitted for FFR analysis of the LAD.  RECOMMENDATIONS: 1. CAD-RADS 0: No evidence of CAD (0%). Consider non-atherosclerotic causes of chest pain.  2. CAD-RADS 1: Minimal non-obstructive CAD (0-24%). Consider non-atherosclerotic causes of chest pain. Consider preventive therapy and risk factor modification.  3. CAD-RADS 2: Mild non-obstructive CAD (25-49%). Consider non-atherosclerotic causes of chest pain. Consider preventive therapy and risk factor modification.  4. CAD-RADS 3: Moderate stenosis. Consider symptom-guided anti-ischemic pharmacotherapy as well as risk factor modification per guideline directed care. Additional analysis with CT FFR will be submitted.  5. CAD-RADS 4: Severe stenosis. (70-99% or > 50% left main). Cardiac catheterization or CT FFR is recommended. Consider symptom-guided anti-ischemic pharmacotherapy as well as risk factor modification per guideline directed care. Invasive coronary angiography recommended with revascularization per published guideline statements.  6. CAD-RADS 5: Total coronary occlusion (100%).  Consider cardiac catheterization or viability assessment. Consider symptom-guided anti-ischemic pharmacotherapy as well as risk factor modification per guideline directed care.  7. CAD-RADS N: Non-diagnostic study. Obstructive CAD can't be excluded. Alternative evaluation is recommended.  Armanda Magic, MD  Electronically Signed: By: Armanda Magic M.D. On: 02/09/2023 22:44          Risk Assessment/Calculations:    CHA2DS2-VASc Score = 4   This indicates a 4.8% annual risk of stroke. The patient's score is based upon: CHF History: 0 HTN History: 1 Diabetes History: 0 Stroke History: 0 Vascular Disease History: 1 Age Score: 2 Gender Score: 0            Physical Exam:   VS:  BP 138/73 (BP Location: Left Arm, Patient Position: Sitting, Cuff Size: Normal)   Pulse 83   Ht 6' (1.829 m)   Wt 195 lb 12.8 oz (88.8 kg)   SpO2 99%   BMI 26.56 kg/m    Wt Readings from Last 3 Encounters:  03/28/23 195 lb 12.8 oz (88.8 kg)  03/01/23 193 lb 9.6 oz (87.8 kg)  02/16/23 193 lb (87.5 kg)    GEN: Well nourished, well developed in no acute distress NECK: No JVD; No carotid bruits CARDIAC: RRR, no murmurs, rubs, gallops RESPIRATORY:  Clear to auscultation without rales, wheezing or rhonchi  ABDOMEN: Soft, non-tender, non-distended EXTREMITIES:  No edema; No deformity   ASSESSMENT AND PLAN: .    Persistent atrial fibrillation s/p ablation X2/hypercoagulable state -managed per Dr. Lalla Brothers.  Had to stop flecainide due to coronary artery disease.  Reports more recent episodes of regular heartbeat and short bursts of atrial fibrillation.  Adjustment of beta-blocker, as below.  CCB previously discontinued due to HFmrEF.  If he has more recurrent episodes of atrial fibrillation he will schedule sooner follow-up with Dr. Lalla Brothers to discuss possible addition of Tikosyn.  HFmrEF - Euvolemic and well compensated on exam. Not tolerating Metoprolol, will discontinue. Start Nebivolol 2.5mg  every  day. Can uptitrate if tolerated. If does not tolerate given PAF, could re-discuss risk/benefit of Diltiazem. No indication for loop diuretic. If  increased symptoms with HFmrEF, plan for SGLT2i.  Hypertension- BP mildly elevated. Continue Losartan 25mg  daily. Stop Metoprolol, start Nebivolol as above. Discussed to monitor BP at home at least 2 hours after medications and sitting for 5-10 minutes.   Nonobstructive CAD - Stable with no anginal symptoms. No indication for ischemic evaluation.  GDMT Nebivolol, Gemfibrozil. Recommend aiming for 150 minutes of moderate intensity activity per week and following a heart healthy diet.         Dispo: follow up in 2 months  Signed, Alver Sorrow, NP

## 2023-03-28 NOTE — Patient Instructions (Addendum)
Medication Instructions:  Your physician has recommended you make the following change in your medication:   STOP Metoprolol Succinate START Nebivolol 2.5 mg daily  *If you need a refill on your cardiac medications before your next appointment, please call your pharmacy*   Follow-Up: At [facility], you and your health needs are our priority.  As part of our continuing mission to provide you with exceptional heart care, we have created designated Provider Care Teams.  These Care Teams include your primary Cardiologist (physician) and Advanced Practice Providers (APPs -  Physician Assistants and Nurse Practitioners) who all work together to provide you with the care you need, when you need it.  We recommend signing up for the patient portal called "MyChart".  Sign up information is provided on this After Visit Summary.  MyChart is used to connect with patients for Virtual Visits (Telemedicine).  Patients are able to view lab/test results, encounter notes, upcoming appointments, etc.  Non-urgent messages can be sent to your provider as well.   To learn more about what you can do with MyChart, go to [email or web address].    Your next appointment:   2 month(s)  Provider:   Chilton Si, MD or Gillian Shields, NP

## 2023-03-31 ENCOUNTER — Ambulatory Visit
Admission: RE | Admit: 2023-03-31 | Discharge: 2023-03-31 | Disposition: A | Discharge status: Home / Self Care | Payer: Medicare PPO | Source: Ambulatory Visit | Attending: Family Medicine | Admitting: Family Medicine

## 2023-03-31 VITALS — BP 152/77 | HR 82 | Temp 98.9°F | Resp 17

## 2023-03-31 DIAGNOSIS — R059 Cough, unspecified: Secondary | ICD-10-CM

## 2023-03-31 DIAGNOSIS — H1033 Unspecified acute conjunctivitis, bilateral: Secondary | ICD-10-CM | POA: Diagnosis not present

## 2023-03-31 DIAGNOSIS — J069 Acute upper respiratory infection, unspecified: Secondary | ICD-10-CM | POA: Diagnosis not present

## 2023-03-31 DIAGNOSIS — J309 Allergic rhinitis, unspecified: Secondary | ICD-10-CM

## 2023-03-31 MED ORDER — AZITHROMYCIN 250 MG PO TABS: 250.0000 mg | ORAL_TABLET | Freq: Every day | ORAL | 0 refills | Status: DC

## 2023-03-31 MED ORDER — MOXIFLOXACIN HCL 0.5 % OP SOLN: 1.0000 [drp] | Freq: Three times a day (TID) | OPHTHALMIC | 0 refills | Status: AC

## 2023-03-31 MED ORDER — PREDNISONE 20 MG PO TABS: ORAL_TABLET | ORAL | 0 refills | Status: DC

## 2023-03-31 MED ORDER — BENZONATATE 200 MG PO CAPS: 200.0000 mg | ORAL_CAPSULE | Freq: Three times a day (TID) | ORAL | 0 refills | Status: AC | PRN

## 2023-03-31 NOTE — ED Triage Notes (Signed)
Pt c/o RT eye discharge and redness that started last night. Says he woke up with it matted shut this am. Has also noticed redness in LT eye as well. Had mild cold earlier this week. Says he's got congestion with dark colored phlegm.

## 2023-03-31 NOTE — ED Provider Notes (Signed)
[facility]    CSN: [id number] Arrival date & time: 03/31/23  0848      History   Chief Complaint Chief Complaint  Patient presents with   Eye Problem    RT, APPT 9am    HPI Garreth Swaziland is a 77 y.o. male.   HPI Very pleasant 77 year old male presents with right upper discharge and right eye redness that began last night.  Patient reports waking up with his eye matted shut.  Has noticed similar symptoms of left eye.  Patient reports congestion and dark-colored phlegm as well.  MH significant for prostate cancer, persistent A-fib, and OSA currently on CPAP.  Patient is on apixaban and denies any unusual bleeding.  Past Medical History:  Diagnosis Date   Dyslipidemia    H/O prostate cancer 05/02/2001   s/p radical prostatectomy (uro Dr. Rayburn Ma in [place] yearly)   Headache 05/02/2010   thorough w/u - unrevealing.  has seen 3 neurologists, MRI, LP nonacute.  ESR = 3   HTN (hypertension)    Hypogonadism male    Low testosterone    Migraines    OSA on CPAP    Persistent atrial fibrillation (HCC) 06/28/2022   Renal cyst 12/01/2010   complex cyst upper pole L kidney with no malignancy by MRI   Vertigo    Vitamin D deficiency     Patient Active Problem List   Diagnosis Date Noted   Electrolyte abnormality 12/16/2022   Syncope 12/15/2022   Hypercoagulable state due to paroxysmal atrial fibrillation (HCC) 11/22/2022   Persistent atrial fibrillation (HCC) 06/28/2022   Chronic tension-type headache, not intractable 11/14/2019   Trigeminal neuralgia syndrome 03/05/2019   Vitamin B 12 deficiency 08/13/2018   Sleep disturbance 07/13/2018   Neuropathic pain 06/20/2018   Facial pain, atypical 04/05/2018   Vitamin D deficiency    OSA on CPAP    HTN (hypertension)    Headache    Low testosterone    H/O prostate cancer    Dyslipidemia     Past Surgical History:  Procedure Laterality Date   ATRIAL FIBRILLATION ABLATION N/A 07/30/2021   Procedure: ATRIAL  FIBRILLATION ABLATION;  Surgeon: Lanier Prude, MD;  Location: [facility];  Service: Cardiovascular;  Laterality: N/A;   ATRIAL FIBRILLATION ABLATION N/A 10/25/2022   Procedure: ATRIAL FIBRILLATION ABLATION;  Surgeon: Lanier Prude, MD;  Location: [facility];  Service: Cardiovascular;  Laterality: N/A;   CHOLECYSTECTOMY  2006   COLONOSCOPY WITH PROPOFOL N/A 02/01/2017   Procedure: COLONOSCOPY WITH PROPOFOL;  Surgeon: Toledo, Boykin Nearing, MD;  Location: [facility];  Service: Gastroenterology;  Laterality: N/A;   ESOPHAGOGASTRODUODENOSCOPY (EGD) WITH PROPOFOL N/A 02/01/2017   Procedure: ESOPHAGOGASTRODUODENOSCOPY (EGD) WITH PROPOFOL;  Surgeon: Toledo, Boykin Nearing, MD;  Location: [facility];  Service: Gastroenterology;  Laterality: N/A;   HERNIA REPAIR  08/2010   PROSTATECTOMY  2003       Home Medications    Prior to Admission medications   Medication Sig Start Date End Date Taking? Authorizing Provider  azithromycin (ZITHROMAX) 250 MG tablet Take 1 tablet (250 mg total) by mouth daily. Take first 2 tablets together, then 1 every day until finished. 03/31/23  Yes Trevor Iha, FNP  benzonatate (TESSALON) 200 MG capsule Take 1 capsule (200 mg total) by mouth 3 (three) times daily as needed for up to 7 days. 03/31/23 04/07/23 Yes Trevor Iha, FNP  moxifloxacin (VIGAMOX) 0.5 % ophthalmic solution Place 1 drop into both eyes 3 (three) times daily for  7 days. 03/31/23 04/07/23 Yes Trevor Iha, FNP  predniSONE (DELTASONE) 20 MG tablet Take 3 tabs PO daily x 5 days. 03/31/23  Yes Trevor Iha, FNP  acetaminophen (TYLENOL) 500 MG tablet Take 1,000 mg by mouth daily as needed for headache or moderate pain.    [provider]  Ascorbic Acid (VITAMIN C PO) Take 1 tablet by mouth daily.    [provider]  b complex vitamins capsule Take 1 capsule by mouth daily.    [provider]  baclofen (LIORESAL) 10 MG tablet Take 10 mg by mouth as needed.  12/09/21   [provider]  Carboxymethylcellul-Glycerin (CLEAR EYES FOR DRY EYES OP) Place 1 drop into both eyes 3 (three) times daily as needed (dryness, irritation).    [provider]  ELIQUIS 5 MG TABS tablet TAKE 1 TABLET(5 MG) BY MOUTH TWICE DAILY 11/07/22   Lanier Prude, MD  gemfibrozil (LOPID) 600 MG tablet Take 600 mg by mouth daily.    [provider]  HYDROcodone-acetaminophen (NORCO/VICODIN) 5-325 MG per tablet Take 0.5-1 tablets by mouth every 6 (six) hours as needed (severe headache).    [provider]  losartan (COZAAR) 25 MG tablet Take 1 tablet (25 mg total) by mouth daily. 03/28/23 06/26/23  Alver Sorrow, NP  meclizine (ANTIVERT) 25 MG tablet Take 1 tablet (25 mg total) by mouth 3 (three) times daily as needed for dizziness. Patient taking differently: Take 12.5-25 mg by mouth 3 (three) times daily as needed for dizziness. 10/04/21   Tegeler, Canary Brim, MD  metoprolol tartrate (LOPRESSOR) 25 MG tablet Take 1 tablet (25 mg total) by mouth 2 (two) times daily. May take up to twice a day as needed for palpitations, please take 4 tablets 2 hours prior to cardiac CT 01/26/23 04/26/23  Chilton Si, MD  nebivolol (BYSTOLIC) 2.5 MG tablet Take 1 tablet (2.5 mg total) by mouth daily. 03/28/23   Alver Sorrow, NP  pregabalin (LYRICA) 100 MG capsule TAKE 1 CAPSULE(100 MG) BY MOUTH THREE TIMES DAILY 01/09/23   Raulkar, Drema Pry, MD  SUMAtriptan (IMITREX) 50 MG tablet Take 1 tablet (50 mg total) by mouth once as needed for migraine. May repeat in 2 hours if headache persists or recurs. 10/15/21 10/20/24  Horton Chin, MD  zolpidem (AMBIEN) 10 MG tablet Take 10 mg by mouth at bedtime. 12/27/21   [provider]    Family History Family History  Problem Relation Age of Onset   Alcohol abuse Father    Cancer Father        prostate   CAD Father        possible MI   Alcohol abuse Paternal Grandfather    Stroke Mother     Diabetes Neg Hx     Social History Social History   Tobacco Use   Smoking status: Never   Smokeless tobacco: Never  Vaping Use   Vaping status: Never Used  Substance Use Topics   Alcohol use: No   Drug use: No     Allergies   Asa [aspirin], Nsaids, and Tricor [fenofibrate]   Review of Systems Review of Systems  HENT:  Positive for congestion, postnasal drip, rhinorrhea, sinus pressure and sore throat.   Eyes:  Positive for discharge and redness.  Respiratory:  Positive for cough.   All other systems reviewed and are negative.    Physical Exam Triage Vital Signs ED Triage Vitals  Encounter Vitals Group     BP 03/31/23  0856 (!) 152/77     Systolic BP Percentile --      Diastolic BP Percentile --      Pulse Rate 03/31/23 0856 82     Resp 03/31/23 0856 17     Temp 03/31/23 0856 98.9 F (37.2 C)     Temp Source 03/31/23 0856 Oral     SpO2 03/31/23 0856 99 %     Weight --      Height --      Head Circumference --      Peak Flow --      Pain Score 03/31/23 0858 0     Pain Loc --      Pain Education --      Exclude from Growth Chart --    No data found.  Updated Vital Signs BP (!) 152/77 (BP Location: Right Arm)   Pulse 82   Temp 98.9 F (37.2 C) (Oral)   Resp 17   SpO2 99%      Physical Exam Vitals and nursing note reviewed.  Constitutional:      Appearance: Normal appearance. He is obese. He is ill-appearing.  HENT:     Head: Normocephalic and atraumatic.     Right Ear: Tympanic membrane, ear canal and external ear normal.     Left Ear: Tympanic membrane, ear canal and external ear normal.     Mouth/Throat:     Mouth: Mucous membranes are moist.     Pharynx: Oropharynx is clear.     Comments: Significant amount of clear drainage of posterior oropharynx noted Eyes:     Extraocular Movements: Extraocular movements intact.     Pupils: Pupils are equal, round, and reactive to light.     Comments: Bilateral eyes: Sclera +3 with injection, dried  mucopurulent discharge noted at inner canthus bilaterally  Cardiovascular:     Rate and Rhythm: Normal rate and regular rhythm.     Pulses: Normal pulses.     Heart sounds: Normal heart sounds.  Pulmonary:     Effort: Pulmonary effort is normal.     Breath sounds: Normal breath sounds. No wheezing, rhonchi or rales.     Comments: Infrequent nonproductive cough noted on exam Musculoskeletal:        General: Normal range of motion.     Cervical back: Normal range of motion and neck supple.  Skin:    General: Skin is warm and dry.  Neurological:     General: No focal deficit present.     Mental Status: He is alert and oriented to person, place, and time. Mental status is at baseline.  Psychiatric:        Mood and Affect: Mood normal.        Behavior: Behavior normal.      UC Treatments / Results  Labs (all labs ordered are listed, but only abnormal results are displayed) Labs Reviewed - No data to display  EKG   Radiology No results found.  Procedures Procedures (including critical care time)  Medications Ordered in UC Medications - No data to display  Initial Impression / Assessment and Plan / UC Course  I have reviewed the triage vital signs and the nursing notes.  Pertinent labs & imaging results that were available during my care of the patient were reviewed by me and considered in my medical decision making (see chart for details).     MDM: 1.  Bacterial conjunctivitis of both eyes-Rx'd Vigamox 0.5% ophthalmic solution: Place 1 drop into both  eyes 3 times daily x 7 days; 2.  Acute URI-Rx'd Zithromax (500 mg day 1, then 250 mg daily 2-5); 3.  Cough, unspecified type-Rx'd prednisone 20 mg tablet: Take 3 tablets p.o. daily x 5 days; 4.  Allergic rhinitis, unspecified seasonality, unspecified trigger-Rx'd Allegra 180 mg fexofenadine daily x 5 days, then as needed. Advised patient to instill eyedrops into both eyes as directed.  Advised patient to take medications as  directed with food to completion.  Advised patient to take prednisone and Allegra with Zithromax daily for the next 5 days.  Advised may take Tessalon capsules daily or as needed for cough.  Encouraged to increase daily water intake to 64 ounces per day while taking these medications.  Advised if symptoms worsen and/or unresolved please follow-up with PCP or here for further evaluation. Final Clinical Impressions(s) / UC Diagnoses   Final diagnoses:  Acute bacterial conjunctivitis of both eyes  Acute URI  Cough, unspecified type  Allergic rhinitis, unspecified seasonality, unspecified trigger     Discharge Instructions      Advised patient to instill eyedrops into both eyes as directed.  Advised patient to take medications as directed with food to completion.  Advised patient to take prednisone and Allegra with Zithromax daily for the next 5 days.  Advised may take Tessalon capsules daily or as needed for cough.  Encouraged to increase daily water intake to 64 ounces per day while taking these medications.  Advised if symptoms worsen and/or unresolved please follow-up with PCP or here for further evaluation.     ED Prescriptions     Medication Sig Dispense Auth. Provider   moxifloxacin (VIGAMOX) 0.5 % ophthalmic solution Place 1 drop into both eyes 3 (three) times daily for 7 days. 3 mL Trevor Iha, FNP   azithromycin (ZITHROMAX) 250 MG tablet Take 1 tablet (250 mg total) by mouth daily. Take first 2 tablets together, then 1 every day until finished. 6 tablet Trevor Iha, FNP   predniSONE (DELTASONE) 20 MG tablet Take 3 tabs PO daily x 5 days. 15 tablet Trevor Iha, FNP   benzonatate (TESSALON) 200 MG capsule Take 1 capsule (200 mg total) by mouth 3 (three) times daily as needed for up to 7 days. 40 capsule Trevor Iha, FNP      PDMP not reviewed this encounter.   Trevor Iha, FNP 03/31/23 1012

## 2023-03-31 NOTE — Discharge Instructions (Addendum)
Advised patient to instill eyedrops into both eyes as directed.  Advised patient to take medications as directed with food to completion.  Advised patient to take prednisone and Allegra with Zithromax daily for the next 5 days.  Advised may take Tessalon capsules daily or as needed for cough.  Encouraged to increase daily water intake to 64 ounces per day while taking these medications.  Advised if symptoms worsen and/or unresolved please follow-up with PCP or here for further evaluation.

## 2023-04-04 ENCOUNTER — Encounter (HOSPITAL_BASED_OUTPATIENT_CLINIC_OR_DEPARTMENT_OTHER): Payer: Self-pay

## 2023-04-04 DIAGNOSIS — I5022 Chronic systolic (congestive) heart failure: Secondary | ICD-10-CM

## 2023-04-04 DIAGNOSIS — I10 Essential (primary) hypertension: Secondary | ICD-10-CM

## 2023-04-11 NOTE — Telephone Encounter (Signed)
Had to stop Flecainide due to CAD. Increased instance arrhythmia. Worthwhile for sooner appt or continue to monitor?  Alver Sorrow, NP

## 2023-04-20 MED ORDER — NEBIVOLOL HCL 5 MG PO TABS: 2.5000 mg | ORAL_TABLET | Freq: Every day | ORAL | 3 refills | Status: DC

## 2023-04-20 NOTE — Telephone Encounter (Signed)
Is BP at goal recommend continue nebivolol 5 mg daily.  Please send updated Rx for 5 mg tablet to take 1 tablet daily.  As persistent arrhythmias recommend a sooner visit with EP Dr. Lalla Brothers or APP. Will CC [facility] scheduling for assistance.   Alver Sorrow, NP

## 2023-05-01 NOTE — Progress Notes (Signed)
  Electrophysiology Office Follow up Visit Note:    Date:  05/02/2023   ID:  Evan Robinson, DOB 1945/11/12, MRN [id number]  PCP:  Charlene Rosina SQUIBB, MD  [facility] Cardiologist:  Annabella Scarce, MD  [facility] Electrophysiologist:  OLE ONEIDA HOLTS, MD    Interval History:     Evan Robinson is a 77 y.o. male who presents for a follow up visit.   I last saw the patient March 01, 2023 for his persistent atrial fibrillation, coronary disease, hypertension.  He had an A-fib ablation October 25, 2022 during which the right inferior pulmonary vein required reisolation.  The posterior wall and CTI were ablated as well.  In the past he was on flecainide  but this had to be stopped because of significant coronary artery disease noted on CT scan.  At the last appointment we discussed dofetilide  if he were to have recurrence of symptomatic atrial fibrillation.  He is seen Evan Robinson in clinic.  She noted increased atrial fibrillation.  He currently takes Eliquis  for stroke prophylaxis.  He has been out of rhythm for at least several weeks.  He thinks before Thanksgiving he was in sinus.  He feels fatigue and palpitations while out of rhythm.  He is very interested in restoring normal rhythm.       Past medical, surgical, social and family history were reviewed.  ROS:   Please see the history of present illness.    All other systems reviewed and are negative.  EKGs/Labs/Other Studies Reviewed:    The following studies were reviewed today:     EKG Interpretation Date/Time:  Tuesday May 02 2023 10:28:48 EST Ventricular Rate:  84 PR Interval:    QRS Duration:  102 QT Interval:  368 QTC Calculation: 434 R Axis:   -24  Text Interpretation: Atrial fibrillation with premature ventricular or aberrantly conducted complexes Confirmed by Holts Ole [phone]) on 05/02/2023 10:44:35 AM    Physical Exam:    VS:  BP (!) 144/92 (BP Location: Left Arm, Patient Position:  Sitting, Cuff Size: Large)   Pulse 84   Ht 6' (1.829 m)   Wt 200 lb 9.6 oz (91 kg)   SpO2 96%   BMI 27.21 kg/m     Wt Readings from Last 3 Encounters:  05/02/23 200 lb 9.6 oz (91 kg)  03/28/23 195 lb 12.8 oz (88.8 kg)  03/01/23 193 lb 9.6 oz (87.8 kg)     GEN: no distress CARD: Irregularly irregular, No MRG RESP: No IWOB. CTAB.      ASSESSMENT:    1. Primary hypertension   2. Persistent atrial fibrillation (HCC)    PLAN:    In order of problems listed above:  #Persistent atrial fibrillation Symptomatic.  On Eliquis  for stroke prophylaxis.  Has failed flecainide  in the past.  Has had 2 prior catheter ablations.  Today we discussed using Tikosyn  to augment rhythm control.  I discussed the process of loading dofetilide  and including its risks and he wishes to proceed.  Continue Eliquis  for stroke prophylaxis.  #Hypertension at goal today.  Recommend checking blood pressures 1-2 times per week at home and recording the values.  Recommend bringing these recordings to the primary care physician.     Signed, Ole Holts, MD, [facility], [facility] 05/02/2023 10:57 AM    Electrophysiology [facility]

## 2023-05-02 ENCOUNTER — Encounter: Payer: Self-pay | Admitting: Cardiology

## 2023-05-02 ENCOUNTER — Ambulatory Visit: Payer: Medicare PPO | Attending: Cardiology | Admitting: Cardiology

## 2023-05-02 ENCOUNTER — Telehealth: Payer: Self-pay | Admitting: Pharmacist

## 2023-05-02 VITALS — BP 144/92 | HR 84 | Ht 72.0 in | Wt 200.6 lb

## 2023-05-02 DIAGNOSIS — I10 Essential (primary) hypertension: Secondary | ICD-10-CM

## 2023-05-02 DIAGNOSIS — I4819 Other persistent atrial fibrillation: Secondary | ICD-10-CM | POA: Diagnosis not present

## 2023-05-02 NOTE — Patient Instructions (Signed)
 Medication Instructions:  Your physician recommends that you continue on your current medications as directed. Please refer to the Current Medication list given to you today.  *If you need a refill on your cardiac medications before your next appointment, please call your pharmacy*  Follow-Up: At [facility], you and your health needs are our priority.  As part of our continuing mission to provide you with exceptional heart care, we have created designated Provider Care Teams.  These Care Teams include your primary Cardiologist (physician) and Advanced Practice Providers (APPs -  Physician Assistants and Nurse Practitioners) who all work together to provide you with the care you need, when you need it.   Tikosyn (Dofetilide) Hospital Admission   Prior to day of admission:  Check with drug insurance company for cost of drug to ensure affordability --- Dofetilide 500 mcg twice a day.  GoodRx is an option if insurance copay is unaffordable.    No Benadryl is allowed 3 days prior to admission.   Please ensure no missed doses of your anticoagulation (blood thinner) for 3 weeks prior to admission. If a dose is missed please notify our office immediately.   A pharmacist will review all your medications for potential interactions with Tikosyn. If any medication changes are needed prior to admission we will be in touch with you.   If any new medications are started AFTER your admission date is set with Radio producer. Please notify our office immediately so your medication list can be updated and reviewed by our pharmacist again.  On day of admission:  Tikosyn initiation requires a 3 night/4 day hospital stay with constant telemetry monitoring. You will have an EKG after each dose of Tikosyn as well as daily lab draws.   If the drug does not convert you to normal rhythm a cardioversion after the 4th dose of Tikosyn.   [facility] office visit on the morning of admission is needed for  preliminary labs/ekg.   Time of admission is dependent on bed availability in the hospital. In some instances, you will be sent home until bed is available. Rarely admission can be delayed to the following day if hospital census prevents available beds.   You may bring personal belongings/clothing with you to the hospital. Please leave your suitcase in the car until you arrive in admissions.   Questions please call our office at [phone]

## 2023-05-02 NOTE — Telephone Encounter (Signed)
 Medication list reviewed in anticipation of upcoming Tikosyn  initiation. Patient is not taking any contraindicated or QTc prolonging medications.   Patient is anticoagulated on Eliquis  on the appropriate dose. Please ensure that patient has not missed any anticoagulation doses in the 3 weeks prior to Tikosyn  initiation.   Patient will need to be counseled to avoid use of Benadryl while on Tikosyn  and in the 2-3 days prior to Tikosyn  initiation.   He has a Z-pack on med list, but this is from Nov and should have completed this medication already. He should avoid azithromycin  while on Tikosyn .

## 2023-05-04 ENCOUNTER — Other Ambulatory Visit: Payer: Self-pay | Admitting: Cardiology

## 2023-05-04 DIAGNOSIS — I5022 Chronic systolic (congestive) heart failure: Secondary | ICD-10-CM

## 2023-05-04 DIAGNOSIS — I10 Essential (primary) hypertension: Secondary | ICD-10-CM

## 2023-05-05 NOTE — Addendum Note (Signed)
 Addended by: Shona Simpson on: 05/05/2023 04:23 PM   Modules accepted: Orders

## 2023-05-05 NOTE — Telephone Encounter (Signed)
 Pt had completed course of Z-pak and prednisone - removed from list.

## 2023-05-07 ENCOUNTER — Other Ambulatory Visit: Payer: Self-pay | Admitting: Cardiology

## 2023-05-07 DIAGNOSIS — I4819 Other persistent atrial fibrillation: Secondary | ICD-10-CM

## 2023-05-08 NOTE — Telephone Encounter (Signed)
 Prescription refill request for Eliquis received. Indication:afib Last office visit:12/24 Scr:0.89  11/24 Age: 78 Weight:91  kg  Prescription refilled

## 2023-05-19 ENCOUNTER — Encounter: Payer: Medicare PPO | Attending: Physical Medicine and Rehabilitation | Admitting: Physical Medicine and Rehabilitation

## 2023-05-19 VITALS — BP 136/85 | HR 77 | Ht 72.0 in | Wt 194.0 lb

## 2023-05-19 DIAGNOSIS — M792 Neuralgia and neuritis, unspecified: Secondary | ICD-10-CM | POA: Insufficient documentation

## 2023-05-19 MED ORDER — CAPSAICIN-CLEANSING GEL 8 % EX KIT
2.0000 | PACK | Freq: Once | CUTANEOUS | Status: AC
  Administered 2023-05-19: 2 via TOPICAL

## 2023-05-19 NOTE — Progress Notes (Signed)
-  Discussed Qutenza as an option for neuropathic pain control. Discussed that this is a capsaicin patch, stronger than capsaicin cream. Discussed that it is currently approved for diabetic peripheral neuropathy and post-herpetic neuralgia, but that it has also shown benefit in treating other forms of neuropathy. Provided patient with link to site to learn more about the patch: https://www.clark.biz/. Discussed that the patch would be placed in office and benefits usually last 3 months. Discussed that unintended exposure to capsaicin can cause severe irritation of eyes, mucous membranes, respiratory tract, and skin, but that Qutenza is a local treatment and does not have the systemic side effects of other nerve medications. Discussed that there may be pain, itching, erythema, and decreased sensory function associated with the application of Qutenza. Side effects usually subside within 1 week. A cold pack of analgesic medications can help with these side effects. Blood pressure can also be increased due to pain associated with administration of the patch.   2 patches of Qutenza [phone]) was applied to neuropathic pain in shoulders. Ice packs were applied during the procedure to ensure patient comfort. Blood pressure was monitored every 15 minutes. The patient tolerated the procedure well. Post-procedure instructions were given and follow-up has been scheduled.  Topical system measures 14cm x20cm (280cm for a total 1120units) were applied which will cause deeper penetration for destruction of the peripheral nerve using a chemical (Qutenza) which infuses into the skin like an injection and heat technique (occlusive, compressive dressing cauing endothermic heat technique)

## 2023-05-23 ENCOUNTER — Encounter (HOSPITAL_BASED_OUTPATIENT_CLINIC_OR_DEPARTMENT_OTHER): Payer: Self-pay | Admitting: Family

## 2023-05-23 ENCOUNTER — Ambulatory Visit (HOSPITAL_BASED_OUTPATIENT_CLINIC_OR_DEPARTMENT_OTHER): Payer: Medicare PPO | Admitting: Family

## 2023-05-23 VITALS — BP 130/88 | HR 91 | Ht 72.0 in | Wt 195.6 lb

## 2023-05-23 DIAGNOSIS — I4819 Other persistent atrial fibrillation: Secondary | ICD-10-CM | POA: Diagnosis not present

## 2023-05-23 DIAGNOSIS — D6859 Other primary thrombophilia: Secondary | ICD-10-CM

## 2023-05-23 DIAGNOSIS — E785 Hyperlipidemia, unspecified: Secondary | ICD-10-CM | POA: Diagnosis not present

## 2023-05-23 DIAGNOSIS — I25118 Atherosclerotic heart disease of native coronary artery with other forms of angina pectoris: Secondary | ICD-10-CM | POA: Diagnosis not present

## 2023-05-23 NOTE — Progress Notes (Signed)
Cardiology Office Note:  .   Date:  05/23/2023  ID:  Evan Robinson, DOB 1945-05-05, MRN [id number] PCP: Genice Rouge, MD  [facility] Providers Cardiologist:  Chilton Si, MD Electrophysiologist:  Lanier Prude, MD    History of Present Illness: .   Evan Robinson is a 78 y.o. male with history of hypertension, HFmrEF, atrial fibrillation/flutter s/p ablation X2, OSA, syncope, MVP.  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   01/26/23 Diltiazem stopped and Metoprolol started. Cardiac CTA with moderate CAD but no obstructive disease. Due to CAD, Flecainide stopped by Dr. Lalla Brothers 03/01/23. Plan to consider Tikosyn if recurrent atrial fib. At visit 03/28/23 Metoprolol not tolerated and transitioned to Nebivolol. Saw Dr. Lalla Brothers 05/02/23 for persistent atrial fibrillation with recommendation for Tikosyn admission.   Presents today for follow up with his wife. Feels better on Nebivolol than Metoprolol. BP controlled by home readings. Reports occasional dizziness like a less-extreme version of vertigo that has not improved with PRN Meclizine. No near syncope, syncope, chest pain, exertional dyspnea. We reviewed Tikosyn admission briefly. Hopeful to be able to do more activity when he is back in NSR.   ROS: Please see the history of present illness.    All other systems reviewed and are negative.   Studies Reviewed: .        Cardiac Studies & Procedures      ECHOCARDIOGRAM  ECHOCARDIOGRAM COMPLETE 12/16/2022  Narrative ECHOCARDIOGRAM REPORT    Patient Name:   Evan Robinson Date of Exam: 12/16/2022 Medical Rec #:  [id number]     Height:       72.0 in Accession #:    [id number]    Weight:       190.0 lb Date of Birth:  [birth year]     BSA:          2.085 m Patient Age:    76 years      BP:           147/73 mmHg Patient Gender: M             HR:           63  bpm. Exam Location:  Inpatient  Procedure: 2D Echo, Cardiac Doppler and Color Doppler  Indications:    Syncope R55  History:        Patient has prior history of Echocardiogram examinations, most recent 10/25/2022.  Sonographer:    Harriette Bouillon RDCS Referring Phys: [phone] WHITNEY PLUNKETT  IMPRESSIONS   1. LVEF is unchanged compared to images from study donw in 2023 . Left ventricular ejection fraction, by estimation, is 45 to 50%. The left ventricle has mildly decreased function. The left ventricular internal cavity size was severely dilated. Left ventricular diastolic parameters were normal. 2. Right ventricular systolic function is mildly reduced. The right ventricular size is mildly enlarged. 3. Mild mitral valve regurgitation. 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 4. The aortic valve is tricuspid. Aortic valve regurgitation is not visualized. Aortic valve sclerosis/calcification is present, without any evidence of aortic stenosis.  FINDINGS Left Ventricle: LVEF is unchanged compared to images from study donw in 2023. Left ventricular ejection fraction, by estimation, is 45 to 50%. The left ventricle has mildly decreased function. The left ventricular internal cavity size was severely dilated. There is no left ventricular hypertrophy. Left ventricular diastolic parameters were normal.  Right Ventricle: The right ventricular size is mildly enlarged. Right vetricular wall thickness was not assessed. Right ventricular systolic function is mildly reduced.  Left Atrium: Left atrial size was normal in size.  Right Atrium: Right atrial size was normal in size.  Pericardium: There is no evidence of pericardial effusion.  Mitral Valve: There is moderate prolapse of both leaflets of the mitral valve. There is mild thickening of the mitral valve leaflet(s). Mild mitral valve regurgitation.  Tricuspid Valve: The tricuspid valve is normal in structure. Tricuspid  valve regurgitation is mild.  Aortic Valve: The aortic valve is tricuspid. Aortic valve regurgitation is not visualized. Aortic valve sclerosis/calcification is present, without any evidence of aortic stenosis.  Pulmonic Valve: The pulmonic valve was normal in structure. Pulmonic valve regurgitation is trivial.  Aorta: The aortic root and ascending aorta are structurally normal, with no evidence of dilitation.  IAS/Shunts: No atrial level shunt detected by color flow Doppler.   LEFT VENTRICLE PLAX 2D LVIDd:         6.40 cm   Diastology LVIDs:         4.20 cm   LV e' medial:    6.74 cm/s LV PW:         0.80 cm   LV E/e' medial:  9.4 LV IVS:        0.70 cm   LV e' lateral:   10.20 cm/s LVOT diam:     2.10 cm   LV E/e' lateral: 6.2 LV SV:         58 LV SV Index:   28 LVOT Area:     3.46 cm   RIGHT VENTRICLE TAPSE (M-mode): 3.6 cm  LEFT ATRIUM             Index        RIGHT ATRIUM           Index LA diam:        4.10 cm 1.97 cm/m   RA Area:     15.20 cm LA Vol (A2C):   74.5 ml 35.74 ml/m  RA Volume:   38.40 ml  18.42 ml/m LA Vol (A4C):   47.5 ml 22.79 ml/m LA Biplane Vol: 62.6 ml 30.03 ml/m AORTIC VALVE             PULMONIC VALVE LVOT Vmax:   80.60 cm/s  PR End Diast Vel: 5.11 msec LVOT Vmean:  51.800 cm/s LVOT VTI:    0.167 m  AORTA Ao Root diam: 4.00 cm Ao Asc diam:  3.50 cm  MITRAL VALVE               TRICUSPID VALVE MV Area (PHT): 2.69 cm    TR Peak grad:   38.7 mmHg MV Decel Time: 282 msec    TR Vmax:        311.00 cm/s MV E velocity: 63.30 cm/s MV A velocity: 48.70 cm/s  SHUNTS MV E/A ratio:  1.30        Systemic VTI:  0.17 m Systemic Diam: 2.10 cm  [name] MD Electronically signed by [name] MD Signature Date/Time: 12/16/2022/12:45:22 PM    Final   MONITORS  LONG TERM MONITOR (3-14 DAYS) 12/20/2022  Narrative HR 42 - 133, average 58 bpm. 3 nonsustained SVT, longest 5 beats. Rare supraventricular and ventricular ectopy. No sustained  arrhythmias. No atrial fibrillation.  Sheria Lang T. Lalla Brothers, MD, [facility], [facility] Cardiac Electrophysiology  CT SCANS  CT CORONARY MORPH W/CTA COR W/SCORE 02/07/2023  Addendum  03/04/2023 12:46 PM ADDENDUM REPORT: 03/04/2023 12:44  EXAM: OVER-READ INTERPRETATION  CT CHEST  The following report is an over-read performed by radiologist Dr. Narda Rutherford of [facility] on 03/04/2023. This over-read does not include interpretation of cardiac or coronary anatomy or pathology. The coronary CTA interpretation by the cardiologist is attached.  COMPARISON:  Cardiac CT 10/18/2022  FINDINGS: Vascular: Aortic atherosclerosis. The included aorta is normal in caliber.  Mediastinum/nodes: No adenopathy or mass. Patulous distal esophagus.  Lungs: Elevated right hemidiaphragm with adjacent atelectasis. Bibasilar bronchiectasis. No pulmonary nodule. No pleural fluid. The included airways are patent.  Upper abdomen: No acute findings. Cholecystectomy. Subcentimeter hypodensity in the right lobe of the liver is too small to characterize but likely small cyst.  Musculoskeletal: There are no acute or suspicious osseous abnormalities. Thoracic spondylosis with anterior spurring.  IMPRESSION: 1. No acute extracardiac findings. 2. Aortic Atherosclerosis (ICD10-I70.0). 3. Elevated right hemidiaphragm with adjacent atelectasis. Chronic bibasilar bronchiectasis.   Electronically Signed By: Narda Rutherford M.D. On: 03/04/2023 12:44  Narrative CLINICAL DATA:  Chest pain  EXAM: Cardiac/Coronary CTA  TECHNIQUE: A non-contrast, gated CT scan was obtained with axial slices of 3 mm through the heart for calcium scoring. Calcium scoring was performed using the Agatston method. A 120 kV prospective, gated, contrast cardiac scan was obtained. Gantry rotation speed was 250 msecs and collimation was 0.6 mm. Two sublingual nitroglycerin tablets (0.8 mg) were given. The 3D data set was  reconstructed in 5% intervals of the 35-75% of the R-R cycle. Diastolic phases were analyzed on a dedicated workstation using MPR, MIP, and VRT modes. The patient received 95 cc of contrast.  FINDINGS: Image quality: Good.  Moderate blooming artifact.  Noise artifact is: Limited.  Coronary Arteries:  Normal coronary origin.  Right dominance.  Left main: The left main is a large caliber vessel with a normal take off from the left coronary cusp that trifurcates into a LAD, LCX, and ramus intermedius. There is mild calcified plaque in the distal LM with associated stenosis of 25-49%.  Left anterior descending artery: The LAD gives off 2 patent diagonal branches. There is diffuse moderate calcified plaque in the proximal LAD with associated stenosis of 50-69%.  Ramus intermedius: Patent and bifurcates into 2 large sub branches with no evidence of plaque or stenosis.  Left circumflex artery: The LCX is non-dominant and patent with no evidence of plaque or stenosis. The LCX gives off 2 patent obtuse marginal branches.  Right coronary artery: The RCA is dominant with normal take off from the right coronary cusp. There is no evidence of plaque or stenosis. The RCA terminates as a PDA and right posterolateral branch without evidence of plaque or stenosis.  Right Atrium: Right atrial size is within normal limits.  Right Ventricle: The right ventricular cavity is within normal limits.  Left Atrium: Left atrial size is normal in size with no left atrial appendage filling defect.  Left Ventricle: The ventricular cavity size is within normal limits.  Pulmonary arteries: Normal in size.  Pulmonary veins: Normal pulmonary venous drainage.  Pericardium: Normal thickness without significant effusion or calcium present.  Cardiac valves: The aortic valve is trileaflet without significant calcification. The mitral valve is normal without significant calcification.  Aorta: Normal  caliber without significant disease.  Extra-cardiac findings: See attached radiology report for non-cardiac structures.  IMPRESSION: 1. Coronary calcium score of 698. This was 66th percentile for age-, sex, and race-matched controls.  2. Total plaque volume 464 mm3 which is  34th percentile for age- and sex-matched controls (calcified plaque 162mm3; non-calcified plaque 377mm3). TPV is severe.  3. Normal coronary origin with right dominance.  4. Moderate atherosclerosis.  50-69% proximal LAD.  5. Consider symptom-guided anti-ischemic and preventive pharmacotherapy as well as risk factor modification per guideline directed care.  6. This study has been submitted for FFR analysis of the LAD.  RECOMMENDATIONS: 1. CAD-RADS 0: No evidence of CAD (0%). Consider non-atherosclerotic causes of chest pain.  2. CAD-RADS 1: Minimal non-obstructive CAD (0-24%). Consider non-atherosclerotic causes of chest pain. Consider preventive therapy and risk factor modification.  3. CAD-RADS 2: Mild non-obstructive CAD (25-49%). Consider non-atherosclerotic causes of chest pain. Consider preventive therapy and risk factor modification.  4. CAD-RADS 3: Moderate stenosis. Consider symptom-guided anti-ischemic pharmacotherapy as well as risk factor modification per guideline directed care. Additional analysis with CT FFR will be submitted.  5. CAD-RADS 4: Severe stenosis. (70-99% or > 50% left main). Cardiac catheterization or CT FFR is recommended. Consider symptom-guided anti-ischemic pharmacotherapy as well as risk factor modification per guideline directed care. Invasive coronary angiography recommended with revascularization per published guideline statements.  6. CAD-RADS 5: Total coronary occlusion (100%). Consider cardiac catheterization or viability assessment. Consider symptom-guided anti-ischemic pharmacotherapy as well as risk factor modification per guideline directed care.  7.  CAD-RADS N: Non-diagnostic study. Obstructive CAD can't be excluded. Alternative evaluation is recommended.  Armanda Magic, MD  Electronically Signed: By: Armanda Magic M.D. On: 02/09/2023 22:44          Risk Assessment/Calculations:    CHA2DS2-VASc Score = 4   This indicates a 4.8% annual risk of stroke. The patient's score is based upon: CHF History: 0 HTN History: 1 Diabetes History: 0 Stroke History: 0 Vascular Disease History: 1 Age Score: 2 Gender Score: 0            Physical Exam:   VS:  BP 130/88   Pulse 91   Ht 6' (1.829 m)   Wt 195 lb 9.6 oz (88.7 kg)   SpO2 100%   BMI 26.53 kg/m    Wt Readings from Last 3 Encounters:  05/23/23 195 lb 9.6 oz (88.7 kg)  05/19/23 194 lb (88 kg)  05/02/23 200 lb 9.6 oz (91 kg)    GEN: Well nourished, well developed in no acute distress NECK: No JVD; No carotid bruits CARDIAC: IRIR, no murmurs, rubs, gallops RESPIRATORY:  Clear to auscultation without rales, wheezing or rhonchi  ABDOMEN: Soft, non-tender, non-distended EXTREMITIES:  No edema; No deformity   ASSESSMENT AND PLAN: .    Persistent atrial fibrillation s/p ablation X2/hypercoagulable state - Managed per Dr. Lalla Brothers.  Had to stop flecainide due to coronary artery disease.  CCB previously discontinued due to HFmrEF. Tolerating Nebivolol better than Metoprolol. Has Tikosyn admission next week for rhythm control. Continue Eliquis 5mg  BID.   HFmrEF - Euvolemic and well compensated on exam. GDMT Nebivolol.. No indication for loop diuretic. If increased symptoms with HFmrEF, plan for SGLT2i.  Hypertension- BP well controlled. Continue current antihypertensive regimen.  .   Nonobstructive CAD - Stable with no anginal symptoms. No indication for ischemic evaluation.  GDMT Nebivolol, Gemfibrozil. Recommend aiming for 150 minutes of moderate intensity activity per week and following a heart healthy diet.         Dispo: follow up in 4-6 months  Signed, Alver Sorrow,  NP

## 2023-05-23 NOTE — Patient Instructions (Signed)
Medication Instructions:  Continue your current medications   *If you need a refill on your cardiac medications before your next appointment, please call your pharmacy*  Follow-Up: At [facility], you and your health needs are our priority.  As part of our continuing mission to provide you with exceptional heart care, we have created designated Provider Care Teams.  These Care Teams include your primary Cardiologist (physician) and Advanced Practice Providers (APPs -  Physician Assistants and Nurse Practitioners) who all work together to provide you with the care you need, when you need it.  We recommend signing up for the patient portal called "MyChart".  Sign up information is provided on this After Visit Summary.  MyChart is used to connect with patients for Virtual Visits (Telemedicine).  Patients are able to view lab/test results, encounter notes, upcoming appointments, etc.  Non-urgent messages can be sent to your provider as well.   To learn more about what you can do with MyChart, go to [email or web address].    Your next appointment:   As scheduled with Atrial Fibrillation Clinic And In 6 months with Dr. Duke Salvia or Alver Sorrow, NP   Other Instructions  Heart Healthy Diet Recommendations: A low-salt diet is recommended. Meats should be grilled, baked, or boiled. Avoid fried foods. Focus on lean protein sources like fish or chicken with vegetables and fruits. The American Heart Association is a Chief Technology Officer!  American Heart Association Diet and Lifeystyle Recommendations   Exercise recommendations: The American Heart Association recommends 150 minutes of moderate intensity exercise weekly. Try 30 minutes of moderate intensity exercise 4-5 times per week. This could include walking, jogging, or swimming.

## 2023-05-26 ENCOUNTER — Encounter (HOSPITAL_COMMUNITY): Payer: Self-pay

## 2023-05-29 ENCOUNTER — Ambulatory Visit (HOSPITAL_COMMUNITY)
Admission: RE | Admit: 2023-05-29 | Discharge: 2023-05-29 | Disposition: A | Discharge status: Home / Self Care | Payer: Medicare PPO | Source: Ambulatory Visit | Attending: Physician Assistant | Admitting: Physician Assistant

## 2023-05-29 ENCOUNTER — Inpatient Hospital Stay (HOSPITAL_COMMUNITY)
Admission: AD | Admit: 2023-05-29 | Discharge: 2023-06-02 | DRG: 309 | Disposition: A | Discharge status: Home / Self Care | Payer: Medicare PPO | Source: Ambulatory Visit | Attending: Cardiology | Admitting: Cardiology

## 2023-05-29 ENCOUNTER — Encounter (HOSPITAL_COMMUNITY): Payer: Self-pay | Admitting: Physician Assistant

## 2023-05-29 ENCOUNTER — Other Ambulatory Visit: Payer: Self-pay

## 2023-05-29 VITALS — BP 144/96 | HR 96 | Ht 72.0 in | Wt 197.6 lb

## 2023-05-29 DIAGNOSIS — G4733 Obstructive sleep apnea (adult) (pediatric): Secondary | ICD-10-CM | POA: Diagnosis present

## 2023-05-29 DIAGNOSIS — Z886 Allergy status to analgesic agent status: Secondary | ICD-10-CM

## 2023-05-29 DIAGNOSIS — E785 Hyperlipidemia, unspecified: Secondary | ICD-10-CM | POA: Diagnosis present

## 2023-05-29 DIAGNOSIS — I110 Hypertensive heart disease with heart failure: Secondary | ICD-10-CM | POA: Insufficient documentation

## 2023-05-29 DIAGNOSIS — D6869 Other thrombophilia: Secondary | ICD-10-CM | POA: Diagnosis present

## 2023-05-29 DIAGNOSIS — I5022 Chronic systolic (congestive) heart failure: Secondary | ICD-10-CM | POA: Diagnosis present

## 2023-05-29 DIAGNOSIS — I5020 Unspecified systolic (congestive) heart failure: Secondary | ICD-10-CM | POA: Insufficient documentation

## 2023-05-29 DIAGNOSIS — I2510 Atherosclerotic heart disease of native coronary artery without angina pectoris: Secondary | ICD-10-CM | POA: Insufficient documentation

## 2023-05-29 DIAGNOSIS — I4819 Other persistent atrial fibrillation: Secondary | ICD-10-CM | POA: Insufficient documentation

## 2023-05-29 DIAGNOSIS — Z7901 Long term (current) use of anticoagulants: Secondary | ICD-10-CM

## 2023-05-29 DIAGNOSIS — Z79899 Other long term (current) drug therapy: Secondary | ICD-10-CM

## 2023-05-29 DIAGNOSIS — Z888 Allergy status to other drugs, medicaments and biological substances status: Secondary | ICD-10-CM

## 2023-05-29 LAB — BASIC METABOLIC PANEL
Anion gap: 8 (ref 5–15)
BUN: 11 mg/dL (ref 8–23)
CO2: 24 mmol/L (ref 22–32)
Calcium: 9 mg/dL (ref 8.9–10.3)
Chloride: 104 mmol/L (ref 98–111)
Creatinine, Ser: 1.23 mg/dL (ref 0.61–1.24)
GFR, Estimated: >60 mL/min (ref >60)
Glucose, Bld: 109 mg/dL — ABNORMAL HIGH (ref 70–99)
Potassium: 4.2 mmol/L (ref 3.5–5.1)
Sodium: 136 mmol/L (ref 135–145)

## 2023-05-29 LAB — MAGNESIUM: Magnesium: 1.9 mg/dL (ref 1.7–2.4)

## 2023-05-29 MED ORDER — ZOLPIDEM TARTRATE 5 MG PO TABS
10.0000 mg | ORAL_TABLET | Freq: Every day | ORAL | Status: DC
  Administered 2023-05-29 – 2023-06-01 (×4): 10 mg via ORAL
  Filled 2023-05-29 (×5): qty 2

## 2023-05-29 MED ORDER — ACETAMINOPHEN 500 MG PO TABS: 1000.0000 mg | ORAL_TABLET | Freq: Every day | ORAL | Status: DC | PRN

## 2023-05-29 MED ORDER — GEMFIBROZIL 600 MG PO TABS
600.0000 mg | ORAL_TABLET | Freq: Every day | ORAL | Status: DC
  Administered 2023-05-30 – 2023-06-02 (×4): 600 mg via ORAL
  Filled 2023-05-29 (×4): qty 1

## 2023-05-29 MED ORDER — VITAMIN C 500 MG PO TABS
500.0000 mg | ORAL_TABLET | Freq: Every day | ORAL | Status: DC
  Administered 2023-05-30 – 2023-06-02 (×4): 500 mg via ORAL
  Filled 2023-05-29 (×4): qty 1

## 2023-05-29 MED ORDER — PREGABALIN 100 MG PO CAPS
100.0000 mg | ORAL_CAPSULE | Freq: Three times a day (TID) | ORAL | Status: DC
  Administered 2023-05-29 – 2023-06-02 (×11): 100 mg via ORAL
  Filled 2023-05-29 (×11): qty 1

## 2023-05-29 MED ORDER — LOSARTAN POTASSIUM 25 MG PO TABS
25.0000 mg | ORAL_TABLET | Freq: Every day | ORAL | Status: DC
  Administered 2023-05-30 – 2023-06-02 (×4): 25 mg via ORAL
  Filled 2023-05-29 (×4): qty 1

## 2023-05-29 MED ORDER — DOFETILIDE 500 MCG PO CAPS
500.0000 ug | ORAL_CAPSULE | Freq: Two times a day (BID) | ORAL | Status: DC
  Administered 2023-05-29 – 2023-06-01 (×6): 500 ug via ORAL
  Filled 2023-05-29 (×6): qty 1

## 2023-05-29 MED ORDER — B COMPLEX-C PO TABS
1.0000 | ORAL_TABLET | Freq: Every day | ORAL | Status: DC
  Administered 2023-05-30 – 2023-06-02 (×4): 1 via ORAL
  Filled 2023-05-29 (×4): qty 1

## 2023-05-29 MED ORDER — APIXABAN 5 MG PO TABS
5.0000 mg | ORAL_TABLET | Freq: Two times a day (BID) | ORAL | Status: DC
  Administered 2023-05-29 – 2023-06-02 (×8): 5 mg via ORAL
  Filled 2023-05-29 (×8): qty 1

## 2023-05-29 MED ORDER — MECLIZINE HCL 25 MG PO TABS: 12.5000 mg | ORAL_TABLET | Freq: Three times a day (TID) | ORAL | Status: DC | PRN

## 2023-05-29 MED ORDER — SODIUM CHLORIDE 0.9% FLUSH
3.0000 mL | Freq: Two times a day (BID) | INTRAVENOUS | Status: DC
  Administered 2023-05-30 – 2023-06-02 (×5): 3 mL via INTRAVENOUS

## 2023-05-29 MED ORDER — BACLOFEN 10 MG PO TABS: 10.0000 mg | ORAL_TABLET | Freq: Every day | ORAL | Status: DC | PRN

## 2023-05-29 MED ORDER — SODIUM CHLORIDE 0.9 % IV SOLN
250.0000 mL | INTRAVENOUS | Status: AC | PRN
Start: 2023-05-29 — End: 2023-05-30

## 2023-05-29 MED ORDER — POLYVINYL ALCOHOL 1.4 % OP SOLN: 1.0000 [drp] | Freq: Three times a day (TID) | OPHTHALMIC | Status: DC | PRN

## 2023-05-29 MED ORDER — NEBIVOLOL HCL 5 MG PO TABS
5.0000 mg | ORAL_TABLET | Freq: Every day | ORAL | Status: DC
  Administered 2023-05-30 – 2023-06-02 (×4): 5 mg via ORAL
  Filled 2023-05-29 (×4): qty 1

## 2023-05-29 MED ORDER — SODIUM CHLORIDE 0.9% FLUSH: 3.0000 mL | INTRAVENOUS | Status: DC | PRN

## 2023-05-29 MED ORDER — MAGNESIUM SULFATE 2 GM/50ML IV SOLN
2.0000 g | Freq: Once | INTRAVENOUS | Status: AC
Start: 2023-05-29 — End: 2023-05-29
  Administered 2023-05-29: 2 g via INTRAVENOUS
  Filled 2023-05-29: qty 50

## 2023-05-29 MED ORDER — B COMPLEX VITAMINS PO CAPS: 1.0000 | ORAL_CAPSULE | Freq: Every day | ORAL | Status: DC

## 2023-05-29 MED ORDER — CARBOXYMETHYLCELLUL-GLYCERIN 1-0.25 % OP SOLN: Freq: Three times a day (TID) | OPHTHALMIC | Status: DC | PRN

## 2023-05-29 MED ORDER — SUMATRIPTAN SUCCINATE 50 MG PO TABS: 50.0000 mg | ORAL_TABLET | Freq: Once | ORAL | Status: DC | PRN

## 2023-05-29 NOTE — H&P (Addendum)
Primary Care Physician: Genice Rouge, MD Primary Cardiologist: Chilton Si, MD Electrophysiologist: Lanier Prude, MD  Referring Physician: [facility]   Evan Robinson is a 78 y.o. male with a history of HTN, OSA, atrial flutter, atrial fibrillation who presents for follow up in the [facility]. The patient was initially maintained on flecainide and underwent afib ablation 07/31/22. He had recurrence of his afib and his flecainide was resumed and he underwent repeat afib ablation and CTI ablation on 10/25/22. Patient is on Eliquis for a CHADS2VASC score of 4.  Patient returns for follow up for atrial fibrillation and dofetilide admission. He reports that he has been in persistent afib since November. He has symptoms of fatigue and palpitations. He denies any missed doses of anticoagulation in the past 3 weeks.   Today, he denies symptoms of chest pain, orthopnea, PND, lower extremity edema, dizziness, presyncope, syncope, snoring, daytime somnolence, bleeding, or neurologic sequela. The patient is tolerating medications without difficulties and is otherwise without complaint today.    Atrial Fibrillation Risk Factors:  he does have symptoms or diagnosis of sleep apnea. he does not have a history of rheumatic fever.   Atrial Fibrillation Management history:  Previous antiarrhythmic drugs: flecainide  Previous cardioversions: none Previous ablations: 07/30/21 Anticoagulation history: Eliquis  ROS- All systems are reviewed and negative except as per the HPI above.   Physical Exam: There were no vitals taken for this visit.  GEN: Well nourished, well developed in no acute distress NECK: No JVD; No carotid bruits CARDIAC: Irregularly irregular rate and rhythm, no murmurs, rubs, gallops RESPIRATORY:  Clear to auscultation without rales, wheezing or rhonchi  ABDOMEN: Soft, non-tender, non-distended EXTREMITIES:  No edema; No deformity     Wt Readings from Last 3 Encounters:  05/29/23 89.6 kg  05/23/23 88.7 kg  05/19/23 88 kg     EKG today demonstrates  Afib Vent. rate 96 BPM PR interval * ms QRS duration 102 ms QT/QTcB 368/464 ms   Echo 12/16/22 demonstrated   1. LVEF is unchanged compared to images from study donw in 2023 . Left  ventricular ejection fraction, by estimation, is 45 to 50%. The left  ventricle has mildly decreased function. The left ventricular internal  cavity size was severely dilated. Left  ventricular diastolic parameters were normal.   2. Right ventricular systolic function is mildly reduced. The right  ventricular size is mildly enlarged.   3. Mild mitral valve regurgitation. 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   4. The aortic valve is tricuspid. Aortic valve regurgitation is not  visualized. Aortic valve sclerosis/calcification is present, without any  evidence of aortic stenosis.    CHA2DS2-VASc Score = 5  The patient's score is based upon: CHF History: 1 (HFmrEF) HTN History: 1 Diabetes History: 0 Stroke History: 0 Vascular Disease History: 1 Age Score: 2 Gender Score: 0       ASSESSMENT AND PLAN: Persistent Atrial Fibrillation (ICD10:  I48.19) The patient's CHA2DS2-VASc score is 5, indicating a 7.2% annual risk of stroke.   S/p afib ablation 07/30/21 and afib and flutter ablation 10/25/22 Patient presents for dofetilide admission. Continue Eliquis 5 mg BID, states no missed doses in the last 3 weeks. No recent benadryl use PharmD has screened medications Labs today show creatinine at 1.23, K+ 4.2 and mag 1.9, CrCl calculated at 63 mL/min Continue diltiazem 120 mg daily with 30 mg PRN q 4 hours for heart racing  We discussed his code status during this admission. He is clear he would like to be a FULL CODE.   Secondary Hypercoagulable State (ICD10:  D68.69) The patient is at significant risk for stroke/thromboembolism based upon his  CHA2DS2-VASc Score of 5.  Continue Apixaban (Eliquis).   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    Jorja Loa PA-C [facility] [address] [place], [place] [address] [phone]   _________________________________________________  Admit for high risk drug loading / monitoring > Tikosyn.  Patient with persistent AF with prior AF ablation 06/2021, 10/2022 and AFL ablation 10/2022. K+ 4.2, Mg+ 1.9.  Anticipate magnesium replacement per pharmacy. Cr Cl 36mL/min based on 1/27 labs. QTc in SR (01/2023) 413 ms.  Current EKG on admission QTc 468 ms in AF.  Based on current Cr Cl, would anticipate BID dosing of dofetilide. He confirms he is not on cardizem scheduled. MD to follow.   Canary Brim, NP-C, AGACNP-BC [facility] - Electrophysiology  05/29/2023, 4:01 PM

## 2023-05-29 NOTE — Progress Notes (Signed)
Pharmacy: Dofetilide (Tikosyn) - Initial Consult Assessment and Electrolyte Replacement  Pharmacy consulted to assist in monitoring and replacing electrolytes in this 78 y.o. male admitted on 05/29/2023 undergoing dofetilide initiation. First dofetilide dose: 05/29/23 PM   Assessment:  Patient Exclusion Criteria: If any screening criteria checked as "Yes", then  patient  should NOT receive dofetilide until criteria item is corrected.  If "Yes" please indicate correction plan.  YES  NO Patient  Exclusion Criteria Correction Plan   []   [x]   Baseline QTc interval is greater than or equal to 440 msec. IF above YES box checked dofetilide contraindicated unless patient has ICD; then may proceed if QTc 500-550 msec or with known ventricular conduction abnormalities may proceed with QTc 550-600 msec. QTc =   468 per EP    []   [x]   Patient is known or suspected to have a digoxin level greater than 2 ng/ml: No results found for: "DIGOXIN"     []   [x]   Creatinine clearance less than 20 ml/min (calculated using Cockcroft-Gault, actual body weight and serum creatinine): Estimated Creatinine Clearance: 54.3 mL/min (by C-G formula based on SCr of 1.23 mg/dL).     []   [x]  Patient has received drugs known to prolong the QT intervals within the last 48 hours (phenothiazines, tricyclics or tetracyclic antidepressants, erythromycin, H-1 antihistamines, cisapride, fluoroquinolones, azithromycin, ondansetron).   Updated information on QT prolonging agents is available to be searched on the following database:QT prolonging agents     []   [x]  Patient received a dose of a thiazide diuretic in the last 48 hours [including hydrochlorothiazide (Oretic) alone or in any combination including triamterene (Dyazide, Maxzide)].    []   [x]  Patient received a medication known to increase dofetilide plasma concentrations prior to initial dofetilide dose:  Trimethoprim (Primsol, Proloprim) in the last 36  hours Verapamil (Calan, Verelan) in the last 36 hours or a sustained release dose in the last 72 hours Megestrol (Megace) in the last 5 days  Cimetidine (Tagamet) in the last 6 hours Ketoconazole (Nizoral) in the last 24 hours Itraconazole (Sporanox) in the last 48 hours  Prochlorperazine (Compazine) in the last 36 hours     []   [x]   Patient is known to have a history of torsades de pointes; congenital or acquired long QT syndromes.    []   [x]   Patient has received a Class 1 antiarrhythmic with less than 2 half-lives since last dose. (Disopyramide, Quinidine, Procainamide, Lidocaine, Mexiletine, Flecainide, Propafenone)    []   [x]   Patient has received amiodarone therapy in the past 3 months or amiodarone level is greater than 0.3 ng/ml.    Labs:    Component Value Date/Time   K 4.2 05/29/2023 1122   MG 1.9 05/29/2023 1122     Plan: Using total body weight ClCr = 62.7 ml/min 1/27  [employer] Calculated CrCl  Dose q12h  [x]  > 60 ml/min 500 mcg  []  40-60 ml/min 250 mcg  []  20-40 ml/min 125 mcg   []   Physician selected initial dose within range recommended for patients level of renal function - will monitor for response.  []   Physician selected initial dose outside of range recommended for patients level of renal function - will discuss if the dose should be altered at this time.   Patient has been appropriately anticoagulated with apixaban. No missed doses in 3 weeks per patient.   Potassium: K >/= 4: Appropriate to initiate Tikosyn, no replacement needed    Magnesium: Mg 1.8-2: Give Mg 2  gm IV x1 to prevent Mg from dropping below 1.8 - do not need to recheck Mg. Appropriate to initiate Tikosyn   Thank you for allowing pharmacy to participate in this patient's care   Alphia Moh, PharmD, BCPS, [facility] Clinical Pharmacist  Please check AMION for all [facility] Pharmacy phone numbers After 10:00 PM, call Main Pharmacy [phone]

## 2023-05-29 NOTE — Progress Notes (Signed)
Primary Care Physician: Genice Rouge, MD Primary Cardiologist: Chilton Si, MD Electrophysiologist: Lanier Prude, MD  Referring Physician: [facility]   Evan Robinson is a 78 y.o. male with a history of HTN, OSA, atrial flutter, atrial fibrillation who presents for follow up in the [facility]. The patient was initially maintained on flecainide and underwent afib ablation 07/31/22. He had recurrence of his afib and his flecainide was resumed and he underwent repeat afib ablation and CTI ablation on 10/25/22. Patient is on Eliquis for a CHADS2VASC score of 4.  Patient returns for follow up for atrial fibrillation and dofetilide admission. He reports that he has been in persistent afib since November. He has symptoms of fatigue and palpitations. He denies any missed doses of anticoagulation in the past 3 weeks.   Today, he denies symptoms of chest pain, orthopnea, PND, lower extremity edema, dizziness, presyncope, syncope, snoring, daytime somnolence, bleeding, or neurologic sequela. The patient is tolerating medications without difficulties and is otherwise without complaint today.    Atrial Fibrillation Risk Factors:  he does have symptoms or diagnosis of sleep apnea. he does not have a history of rheumatic fever.   Atrial Fibrillation Management history:  Previous antiarrhythmic drugs: flecainide  Previous cardioversions: none Previous ablations: 07/30/21 Anticoagulation history: Eliquis  ROS- All systems are reviewed and negative except as per the HPI above.   Physical Exam: BP (!) 144/96   Pulse 96   Ht 6' (1.829 m)   Wt 89.6 kg   BMI 26.80 kg/m   GEN: Well nourished, well developed in no acute distress NECK: No JVD; No carotid bruits CARDIAC: Irregularly irregular rate and rhythm, no murmurs, rubs, gallops RESPIRATORY:  Clear to auscultation without rales, wheezing or rhonchi  ABDOMEN: Soft, non-tender,  non-distended EXTREMITIES:  No edema; No deformity    Wt Readings from Last 3 Encounters:  05/29/23 89.6 kg  05/23/23 88.7 kg  05/19/23 88 kg     EKG today demonstrates  Afib Vent. rate 96 BPM PR interval * ms QRS duration 102 ms QT/QTcB 368/464 ms   Echo 12/16/22 demonstrated   1. LVEF is unchanged compared to images from study donw in 2023 . Left  ventricular ejection fraction, by estimation, is 45 to 50%. The left  ventricle has mildly decreased function. The left ventricular internal  cavity size was severely dilated. Left  ventricular diastolic parameters were normal.   2. Right ventricular systolic function is mildly reduced. The right  ventricular size is mildly enlarged.   3. Mild mitral valve regurgitation. 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   4. The aortic valve is tricuspid. Aortic valve regurgitation is not  visualized. Aortic valve sclerosis/calcification is present, without any  evidence of aortic stenosis.    CHA2DS2-VASc Score = 5  The patient's score is based upon: CHF History: 1 (HFmrEF) HTN History: 1 Diabetes History: 0 Stroke History: 0 Vascular Disease History: 1 Age Score: 2 Gender Score: 0       ASSESSMENT AND PLAN: Persistent Atrial Fibrillation (ICD10:  I48.19) The patient's CHA2DS2-VASc score is 5, indicating a 7.2% annual risk of stroke.   S/p afib ablation 07/30/21 and afib and flutter ablation 10/25/22 Patient presents for dofetilide admission. Continue Eliquis 5 mg BID, states no missed doses in the last 3 weeks. No recent benadryl use PharmD has screened medications Labs today show creatinine at 1.23, K+ 4.2 and mag 1.9, CrCl calculated at 63  mL/min Continue diltiazem 120 mg daily with 30 mg PRN q 4 hours for heart racing We discussed his code status during this admission. He is clear he would like to be a FULL CODE.   Secondary Hypercoagulable State (ICD10:  D68.69) The patient is at significant  risk for stroke/thromboembolism based upon his CHA2DS2-VASc Score of 5.  Continue Apixaban (Eliquis).   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    Jorja Loa PA-C [facility] [address] [place], [place] [address] [phone]

## 2023-05-30 ENCOUNTER — Other Ambulatory Visit: Payer: Self-pay

## 2023-05-30 ENCOUNTER — Other Ambulatory Visit (HOSPITAL_COMMUNITY): Payer: Self-pay

## 2023-05-30 ENCOUNTER — Telehealth (HOSPITAL_COMMUNITY): Payer: Self-pay | Admitting: Pharmacy Technician

## 2023-05-30 ENCOUNTER — Encounter (HOSPITAL_COMMUNITY): Payer: Self-pay | Admitting: Cardiology

## 2023-05-30 DIAGNOSIS — I4819 Other persistent atrial fibrillation: Secondary | ICD-10-CM | POA: Diagnosis not present

## 2023-05-30 LAB — BASIC METABOLIC PANEL
Anion gap: 7 (ref 5–15)
BUN: 13 mg/dL (ref 8–23)
CO2: 26 mmol/L (ref 22–32)
Calcium: 8.7 mg/dL — ABNORMAL LOW (ref 8.9–10.3)
Chloride: 104 mmol/L (ref 98–111)
Creatinine, Ser: 0.94 mg/dL (ref 0.61–1.24)
GFR, Estimated: >60 mL/min (ref >60)
Glucose, Bld: 95 mg/dL (ref 70–99)
Potassium: 3.8 mmol/L (ref 3.5–5.1)
Sodium: 137 mmol/L (ref 135–145)

## 2023-05-30 LAB — MAGNESIUM: Magnesium: 1.9 mg/dL (ref 1.7–2.4)

## 2023-05-30 MED ORDER — SODIUM CHLORIDE 0.9 % IV SOLN: INTRAVENOUS | Status: AC

## 2023-05-30 MED ORDER — MAGNESIUM SULFATE 2 GM/50ML IV SOLN
2.0000 g | Freq: Once | INTRAVENOUS | Status: AC
  Administered 2023-05-30: 2 g via INTRAVENOUS
  Filled 2023-05-30: qty 50

## 2023-05-30 MED ORDER — POTASSIUM CHLORIDE CRYS ER 20 MEQ PO TBCR
40.0000 meq | EXTENDED_RELEASE_TABLET | Freq: Once | ORAL | Status: AC
  Administered 2023-05-30: 40 meq via ORAL
  Filled 2023-05-30: qty 2

## 2023-05-30 NOTE — Progress Notes (Signed)
Morning EKG reviewed     Shows pt remains in afib with stable QTc at 489 ms.  Continue  Tikosyn 500 mcg BID.   Potassium3.8 (01/28 0434) Magnesium  1.9 (01/28 0434) Creatinine, ser  0.94 (01/28 0434)  Pt will be NPO after midnight for DCCV if remains in afib     Canary Brim, NP-C, AGACNP-BC [facility] - Electrophysiology  05/30/2023, 12:29 PM

## 2023-05-30 NOTE — Progress Notes (Signed)
   Electrophysiology Rounding Note  Patient Name: Evan Robinson Date of Encounter: 05/30/2023  Primary Cardiologist: Chilton Si, MD  Electrophysiologist: Lanier Prude, MD    Subjective   Pt remains in afib on Tikosyn 500 mcg BID   QTc from EKG last pm shows stable QTc at 485 ms  The patient is doing well today.  At this time, the patient denies chest pain, shortness of breath, or any new concerns.  Inpatient Medications    Scheduled Meds:  apixaban  5 mg Oral BID   ascorbic acid  500 mg Oral Daily   B-complex with vitamin C  1 tablet Oral Daily   dofetilide  500 mcg Oral BID   gemfibrozil  600 mg Oral Daily   losartan  25 mg Oral Daily   nebivolol  5 mg Oral Daily   pregabalin  100 mg Oral TID   sodium chloride flush  3 mL Intravenous Q12H   zolpidem  10 mg Oral QHS   Continuous Infusions:  sodium chloride     PRN Meds: sodium chloride, acetaminophen, baclofen, meclizine, polyvinyl alcohol, sodium chloride flush, SUMAtriptan   Vital Signs    Vitals:   05/29/23 1552 05/29/23 2034 05/29/23 2338 05/30/23 0353  BP: (!) 148/91 (!) 148/96 105/86 99/73  Pulse: 79 78 96   Resp: 16 18 18 18   Temp: 97.6 F (36.4 C) 97.8 F (36.6 C) 97.8 F (36.6 C) 97.7 F (36.5 C)  TempSrc: Oral Oral Oral Oral  SpO2: 100% 99% 98% 94%  Weight: 90.6 kg     Height: 6' (1.829 m)      No intake or output data in the 24 hours ending 05/30/23 0648 Filed Weights   05/29/23 1552  Weight: 90.6 kg    Physical Exam    GEN- NAD, A&O x 3. Normal affect.  Lungs- CTAB, Normal effort.  Heart- Irregularly irregular rate and rhythm. No M/G/R GI- Soft, NT, ND Extremities- No clubbing, cyanosis, or edema Skin- no rash or lesion  Labs    CBC No results for input(s): "WBC", "NEUTROABS", "HGB", "HCT", "MCV", "PLT" in the last 72 hours. Basic Metabolic Panel Recent Labs    16/10/96 1122 05/30/23 0434  NA 136 137  K 4.2 3.8  CL 104 104  CO2 24 26  GLUCOSE 109* 95  BUN 11 13   CREATININE 1.23 0.94  CALCIUM 9.0 8.7*  MG 1.9 1.9    Telemetry    AF 80-105 bpm, occ PVC's (personally reviewed)  Patient Profile     Evan Robinson is a 78 y.o. male with a past medical history significant for persistent atrial fibrillation.  They were admitted for tikosyn load.   Assessment & Plan    Persistent Atrial Fibrillation Pt remains in afib on Tikosyn 500 mcg BID  Continue Eliquis Creatinine, ser  0.94 (01/28 0434) Magnesium  1.9 (01/28 0434) Potassium3.8 (01/28 0434) Supplement both K and Mg per protocol  If pt does not convert chemically, plan on DCCV Wednesday    For questions or updates, please contact [facility] Please consult [email or web address] for contact info under Cardiology/STEMI.  Signed, Canary Brim, NP-C, AGACNP-BC [facility] - Electrophysiology  05/30/2023, 6:54 AM

## 2023-05-30 NOTE — Telephone Encounter (Signed)
Patient Product/process development scientist completed.    The patient is insured through [employer]. Patient has Medicare and is not eligible for a copay card, but may be able to apply for patient assistance or Medicare RX Payment Plan (Patient Must reach out to their plan, if eligible for payment plan), if available.    Ran test claim for dofetilide (Tikosyn) 500 mcg and the current 30 day co-pay is $10.00.   This test claim was processed through [facility]- copay amounts may vary at other pharmacies due to pharmacy/plan contracts, or as the patient moves through the different stages of their insurance plan.     Roland Earl, CPHT Pharmacy Technician III Certified Patient Advocate [facility] Patient Advocate Team Direct Number: [phone]  Fax: [phone]

## 2023-05-30 NOTE — Progress Notes (Signed)
Pharmacy: Dofetilide (Tikosyn) - Follow Up Assessment and Electrolyte Replacement  Pharmacy consulted to assist in monitoring and replacing electrolytes in this 78 y.o. male admitted on 05/29/2023 undergoing dofetilide initiation. First dofetilide dose: 500 mcg 1/28 PM  Labs:    Component Value Date/Time   K 3.8 05/30/2023 0434   MG 1.9 05/30/2023 0434     Plan: Potassium: K 3.8-3.9:  Give KCl 40 mEq po x1   Magnesium: Mg 1.8-2: Give Mg 2 gm IV x1   Thank you for allowing pharmacy to participate in this patient's care   Carron Brazen 05/30/2023  7:18 AM

## 2023-05-30 NOTE — TOC CM/SW Note (Addendum)
Transition of Care [facility]) - Inpatient Brief Assessment   Patient Details  Name: Evan Robinson MRN: [id number] Date of Birth: 1945/08/11  Transition of Care [facility]) CM/SW Contact:    Gala Lewandowsky, RN Phone Number: 05/30/2023, 3:38 PM   Clinical Narrative: Patient presented for Tikosyn Load. Case Manager spoke with the patient regarding co pay cost. Patient is agreeable to cost and would like to have the initial Rx sent to [facility] and the Rx refills 90 day supply sent to [facility] [place], [place]. No further needs identified at this time.   Transition of Care Asessment: Insurance and Status: Insurance coverage has been reviewed Patient has primary care physician: Yes Home environment has been reviewed: reviewed Prior level of function:: independent Prior/Current Home Services: No current home services Social Drivers of Health Review: SDOH reviewed no interventions necessary Readmission risk has been reviewed: Yes Transition of care needs: no transition of care needs at this time

## 2023-05-31 ENCOUNTER — Other Ambulatory Visit: Payer: Self-pay

## 2023-05-31 ENCOUNTER — Inpatient Hospital Stay (HOSPITAL_COMMUNITY): Payer: Medicare PPO

## 2023-05-31 ENCOUNTER — Encounter (HOSPITAL_COMMUNITY)
Admission: AD | Disposition: A | Discharge status: Home / Self Care | Payer: Self-pay | Source: Ambulatory Visit | Attending: Cardiology

## 2023-05-31 ENCOUNTER — Encounter (HOSPITAL_COMMUNITY): Payer: Self-pay | Admitting: Cardiology

## 2023-05-31 DIAGNOSIS — I4819 Other persistent atrial fibrillation: Secondary | ICD-10-CM | POA: Diagnosis not present

## 2023-05-31 LAB — BASIC METABOLIC PANEL
Anion gap: 9 (ref 5–15)
BUN: 15 mg/dL (ref 8–23)
CO2: 27 mmol/L (ref 22–32)
Calcium: 9.1 mg/dL (ref 8.9–10.3)
Chloride: 98 mmol/L (ref 98–111)
Creatinine, Ser: 1.11 mg/dL (ref 0.61–1.24)
GFR, Estimated: >60 mL/min (ref >60)
Glucose, Bld: 105 mg/dL — ABNORMAL HIGH (ref 70–99)
Potassium: 4.4 mmol/L (ref 3.5–5.1)
Sodium: 134 mmol/L — ABNORMAL LOW (ref 135–145)

## 2023-05-31 LAB — MAGNESIUM: Magnesium: 2 mg/dL (ref 1.7–2.4)

## 2023-05-31 SURGERY — CARDIOVERSION (CATH LAB)
Anesthesia: General

## 2023-05-31 MED ORDER — MAGNESIUM SULFATE 2 GM/50ML IV SOLN
2.0000 g | Freq: Once | INTRAVENOUS | Status: AC
Start: 2023-05-31 — End: 2023-05-31
  Administered 2023-05-31: 2 g via INTRAVENOUS
  Filled 2023-05-31: qty 50

## 2023-05-31 NOTE — Plan of Care (Signed)

## 2023-05-31 NOTE — Progress Notes (Signed)
   Electrophysiology Rounding Note  Patient Name: Evan Robinson Date of Encounter: 05/31/2023  Primary Cardiologist: Chilton Si, MD  Electrophysiologist: Lanier Prude, MD    Subjective   Pt converted to sinus rhythm on Tikosyn 500 mcg BID   QTc from EKG last pm shows stable QTc at 449 ms (manual measurement)  The patient is doing well today.  At this time, the patient denies chest pain, shortness of breath, or any new concerns.  Inpatient Medications    Scheduled Meds:  apixaban  5 mg Oral BID   ascorbic acid  500 mg Oral Daily   B-complex with vitamin C  1 tablet Oral Daily   dofetilide  500 mcg Oral BID   gemfibrozil  600 mg Oral Daily   losartan  25 mg Oral Daily   nebivolol  5 mg Oral Daily   pregabalin  100 mg Oral TID   sodium chloride flush  3 mL Intravenous Q12H   zolpidem  10 mg Oral QHS   Continuous Infusions:  sodium chloride     magnesium sulfate bolus IVPB 2 g (05/31/23 0812)   PRN Meds: acetaminophen, baclofen, meclizine, polyvinyl alcohol, sodium chloride flush, SUMAtriptan   Vital Signs    Vitals:   05/30/23 1520 05/30/23 1951 05/31/23 0424 05/31/23 0810  BP: (!) 144/98 (!) 151/112 112/80   Pulse: (!) 106 (!) 109 (!) 57 61  Resp: 16 18 18 20   Temp: 97.6 F (36.4 C) 97.8 F (36.6 C) 97.8 F (36.6 C) 97.8 F (36.6 C)  TempSrc: Oral Oral Oral Oral  SpO2: 98% 98% 97%   Weight:      Height:        Intake/Output Summary (Last 24 hours) at 05/31/2023 0813 Last data filed at 05/30/2023 2158 Gross per 24 hour  Intake 723 ml  Output --  Net 723 ml   Filed Weights   05/29/23 1552  Weight: 90.6 kg    Physical Exam    GEN- NAD, A&O x 3. Normal affect.  Lungs- CTAB, Normal effort.  Heart- Regular rate and rhythm. No M/G/R GI- Soft, NT, ND Extremities- No clubbing, cyanosis, or edema Skin- no rash or lesion  Labs    CBC No results for input(s): "WBC", "NEUTROABS", "HGB", "HCT", "MCV", "PLT" in the last 72 hours. Basic  Metabolic Panel Recent Labs    14/78/29 0434 05/31/23 0529  NA 137 134*  K 3.8 4.4  CL 104 98  CO2 26 27  GLUCOSE 95 105*  BUN 13 15  CREATININE 0.94 1.11  CALCIUM 8.7* 9.1  MG 1.9 2.0    Telemetry    AF until ~ 2000 on 1/28, then converts to SB/SR 50-60's with occ PVC, 1 couplet noted (personally reviewed)  Patient Profile     Evan Robinson is a 78 y.o. male with a past medical history significant for persistent atrial fibrillation.  They were admitted for tikosyn load.   Assessment & Plan    Persistent Atrial Fibrillation Pt converted to sinus rhythm on Tikosyn 500 mcg BID  Continue Eliquis Creatinine, ser  1.11 (01/29 0529) Magnesium  2.0 (01/29 0529) Potassium4.4 (01/29 0529) Supplement Mg  Plan for home Thursday if QTc remains stable.   For questions or updates, please contact [facility] Please consult [email or web address] for contact info under Cardiology/STEMI.  Signed, Canary Brim, NP-C, AGACNP-BC [facility] - Electrophysiology  05/31/2023, 8:18 AM

## 2023-05-31 NOTE — Progress Notes (Signed)
Pharmacy: Dofetilide (Tikosyn) - Follow Up Assessment and Electrolyte Replacement  Pharmacy consulted to assist in monitoring and replacing electrolytes in this 78 y.o. male admitted on 05/29/2023 undergoing dofetilide initiation. First dofetilide dose: 500 mcg 1/28 PM  Labs:    Component Value Date/Time   K 4.4 05/31/2023 0529   MG 2.0 05/31/2023 0529     Plan: Potassium: K >/= 4: No additional supplementation needed  Magnesium: Mg 1.8-2: Give Mg 2 gm IV x1   Thank you for allowing pharmacy to participate in this patient's care   Carron Brazen 05/31/2023  7:19 AM

## 2023-05-31 NOTE — Plan of Care (Signed)
  Problem: Education: Goal: Knowledge of disease or condition will improve 05/31/2023 0743 by Manuela Neptune, RN Outcome: Progressing  Goal: Understanding of medication regimen will improve 05/31/2023 0743 by Manuela Neptune, RN Outcome: Progressing  Problem: Activity: Goal: Ability to tolerate increased activity will improve 05/31/2023 0743 by Manuela Neptune, RN Outcome: Progressing   Problem: Cardiac: Goal: Ability to achieve and maintain adequate cardiopulmonary perfusion will improve 05/31/2023 0743 by Manuela Neptune, RN Outcome: Progressing  Problem: Health Behavior/Discharge Planning: Goal: Ability to safely manage health-related needs after discharge will improve 05/31/2023 0743 by Manuela Neptune, RN Outcome: Progressing

## 2023-06-01 ENCOUNTER — Other Ambulatory Visit: Payer: Self-pay

## 2023-06-01 DIAGNOSIS — I4819 Other persistent atrial fibrillation: Secondary | ICD-10-CM | POA: Diagnosis not present

## 2023-06-01 LAB — BASIC METABOLIC PANEL
Anion gap: 12 (ref 5–15)
BUN: 14 mg/dL (ref 8–23)
CO2: 26 mmol/L (ref 22–32)
Calcium: 9 mg/dL (ref 8.9–10.3)
Chloride: 97 mmol/L — ABNORMAL LOW (ref 98–111)
Creatinine, Ser: 1.14 mg/dL (ref 0.61–1.24)
GFR, Estimated: >60 mL/min (ref >60)
Glucose, Bld: 101 mg/dL — ABNORMAL HIGH (ref 70–99)
Potassium: 4.3 mmol/L (ref 3.5–5.1)
Sodium: 135 mmol/L (ref 135–145)

## 2023-06-01 LAB — MAGNESIUM: Magnesium: 1.9 mg/dL (ref 1.7–2.4)

## 2023-06-01 MED ORDER — MAGNESIUM SULFATE 2 GM/50ML IV SOLN
2.0000 g | Freq: Once | INTRAVENOUS | Status: AC
  Administered 2023-06-01: 2 g via INTRAVENOUS
  Filled 2023-06-01: qty 50

## 2023-06-01 MED ORDER — DOFETILIDE 250 MCG PO CAPS
250.0000 ug | ORAL_CAPSULE | Freq: Two times a day (BID) | ORAL | Status: DC
  Administered 2023-06-01 – 2023-06-02 (×2): 250 ug via ORAL
  Filled 2023-06-01 (×2): qty 1

## 2023-06-01 NOTE — Progress Notes (Signed)
Pharmacy: Dofetilide (Tikosyn) - Follow Up Assessment and Electrolyte Replacement  Pharmacy consulted to assist in monitoring and replacing electrolytes in this 78 y.o. male admitted on 05/29/2023 undergoing dofetilide initiation. First dofetilide dose: 500 mcg 1/28 PM  Labs:    Component Value Date/Time   K 4.3 06/01/2023 0358   MG 1.9 06/01/2023 0358     Plan: Potassium: K >/= 4: No additional supplementation needed  Magnesium: Mg 1.8-2: Give Mg 2 gm IV x1   Thank you for allowing pharmacy to participate in this patient's care   Kirk Ruths Paytes 06/01/2023  7:06 AM

## 2023-06-01 NOTE — Care Management Important Message (Signed)
Important Message  Patient Details  Name: Evan Robinson MRN: [id number] Date of Birth: [birth year]   Important Message Given:  Yes - Medicare IM     [name] 06/01/2023, 11:01 AM

## 2023-06-01 NOTE — Plan of Care (Signed)

## 2023-06-01 NOTE — Plan of Care (Signed)
  Problem: Health Behavior/Discharge Planning: Goal: Ability to manage health-related needs will improve Outcome: Progressing   Problem: Clinical Measurements: Goal: Ability to maintain clinical measurements within normal limits will improve Outcome: Progressing Goal: Will remain free from infection Outcome: Progressing Goal: Diagnostic test results will improve Outcome: Progressing Goal: Respiratory complications will improve Outcome: Progressing Goal: Cardiovascular complication will be avoided Outcome: Progressing   Problem: Activity: Goal: Risk for activity intolerance will decrease Outcome: Progressing   Problem: Nutrition: Goal: Adequate nutrition will be maintained Outcome: Progressing   Problem: Coping: Goal: Level of anxiety will decrease Outcome: Progressing   Problem: Elimination: Goal: Will not experience complications related to bowel motility Outcome: Progressing Goal: Will not experience complications related to urinary retention Outcome: Progressing   Problem: Pain Managment: Goal: General experience of comfort will improve and/or be controlled Outcome: Progressing   Problem: Education: Goal: Knowledge of disease or condition will improve Outcome: Progressing Goal: Understanding of medication regimen will improve Outcome: Progressing Goal: Individualized Educational Video(s) Outcome: Progressing   Problem: Cardiac: Goal: Ability to achieve and maintain adequate cardiopulmonary perfusion will improve Outcome: Progressing

## 2023-06-01 NOTE — Progress Notes (Signed)
Morning EKG reviewed     Shows remains in NSR with stable QTc at 480 ms.  Continue  Tikosyn 500 mcg BID.   Potassium4.3 (01/30 0358) Magnesium  1.9 (01/30 0358) Creatinine, ser  1.14 (01/30 0358)  Mg+ supplementation.   Plan for home Thursday if QTc remains stable    Canary Brim, NP-C, AGACNP-BC [facility] - Electrophysiology  06/01/2023, 9:00 AM

## 2023-06-02 ENCOUNTER — Other Ambulatory Visit (HOSPITAL_COMMUNITY): Payer: Self-pay

## 2023-06-02 LAB — BASIC METABOLIC PANEL
Anion gap: 10 (ref 5–15)
BUN: 12 mg/dL (ref 8–23)
CO2: 26 mmol/L (ref 22–32)
Calcium: 9 mg/dL (ref 8.9–10.3)
Chloride: 95 mmol/L — ABNORMAL LOW (ref 98–111)
Creatinine, Ser: 0.95 mg/dL (ref 0.61–1.24)
GFR, Estimated: >60 mL/min (ref >60)
Glucose, Bld: 127 mg/dL — ABNORMAL HIGH (ref 70–99)
Potassium: 4.5 mmol/L (ref 3.5–5.1)
Sodium: 131 mmol/L — ABNORMAL LOW (ref 135–145)

## 2023-06-02 LAB — MAGNESIUM: Magnesium: 1.7 mg/dL (ref 1.7–2.4)

## 2023-06-02 IMAGING — DX DG CHEST 2V
2 series · 2 of 2 positions shown · non-contrast
Comparison: None.

CLINICAL DATA: Palpitations with physical activity over the past 2
weeks.

EXAM:
CHEST - 2 VIEW

[chest pa]
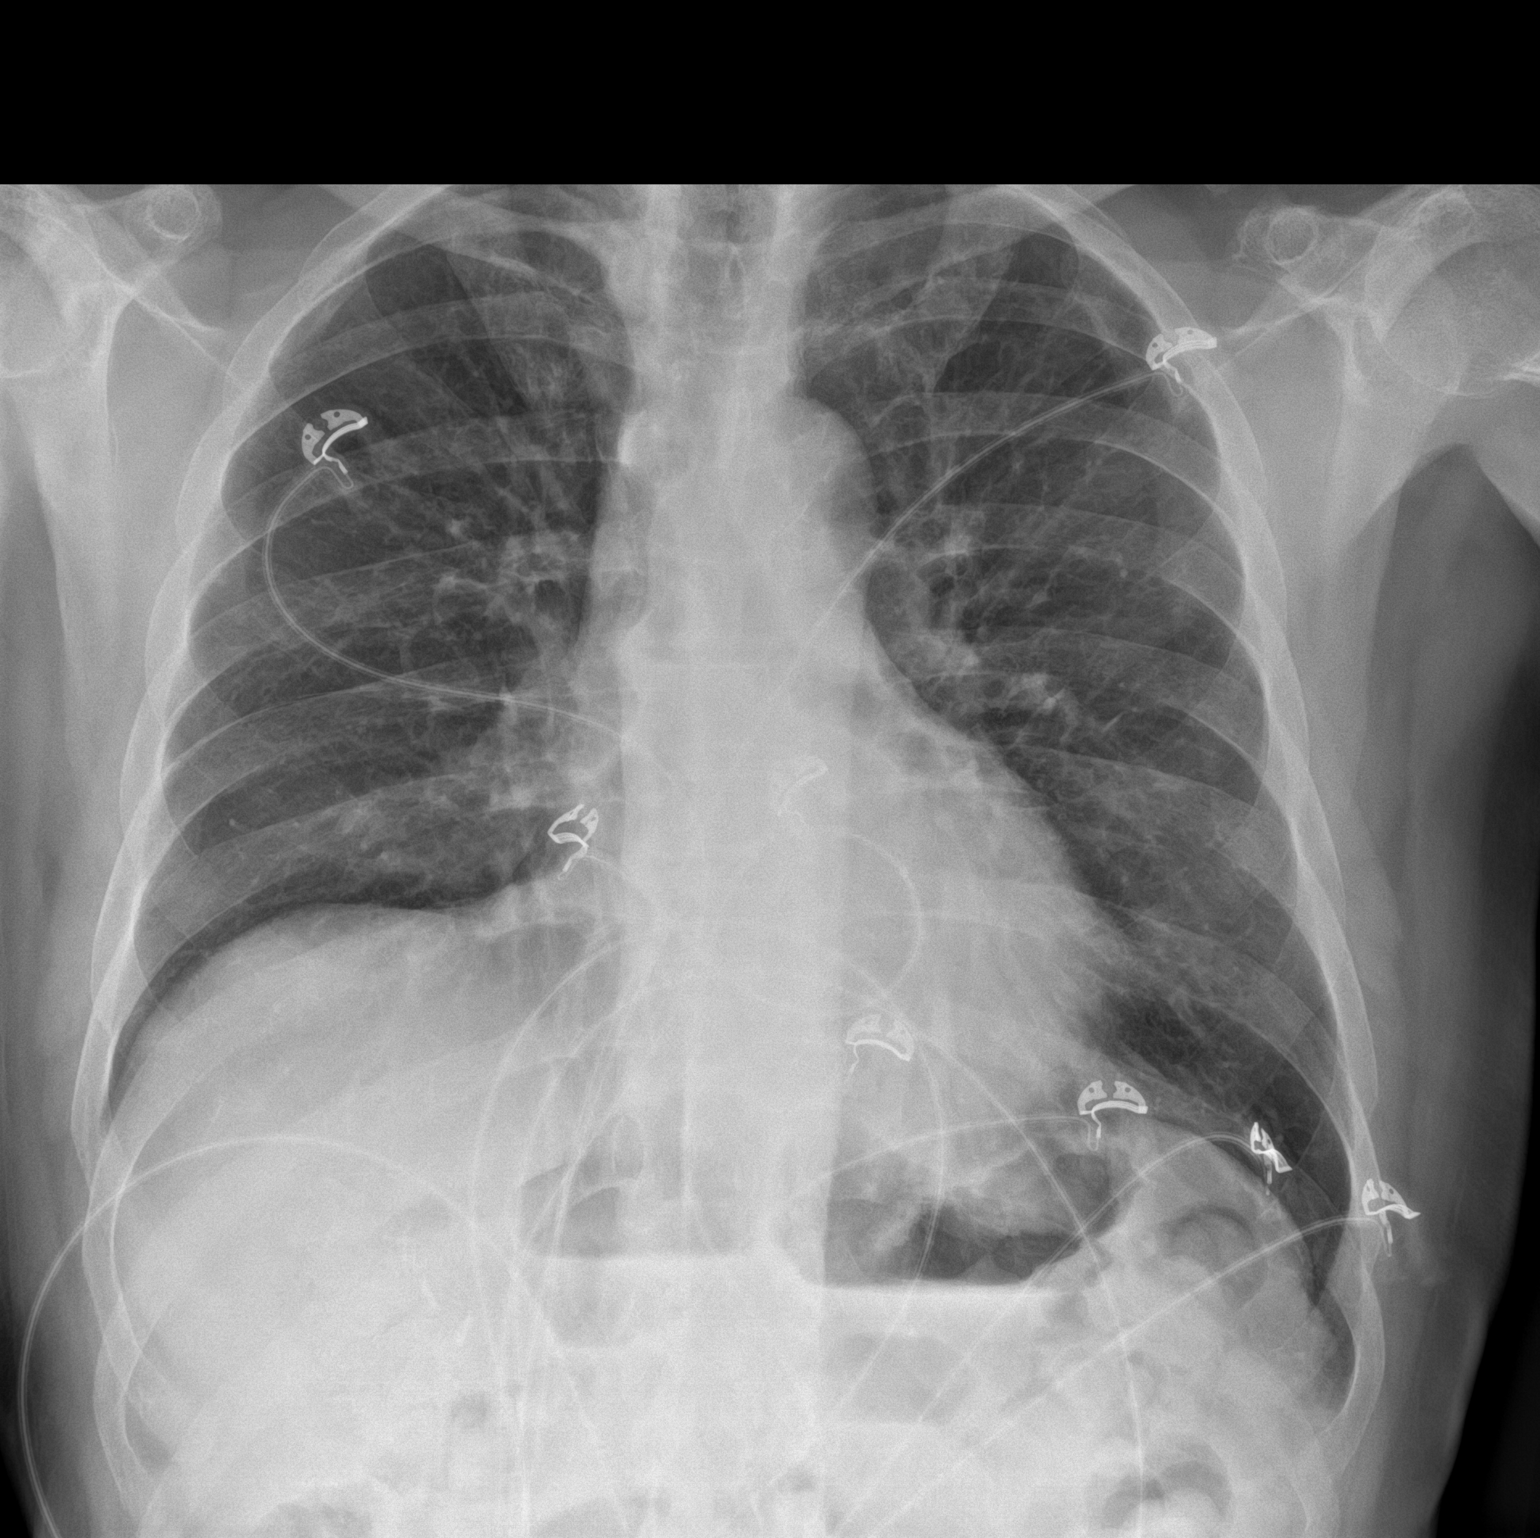

[chest lat]
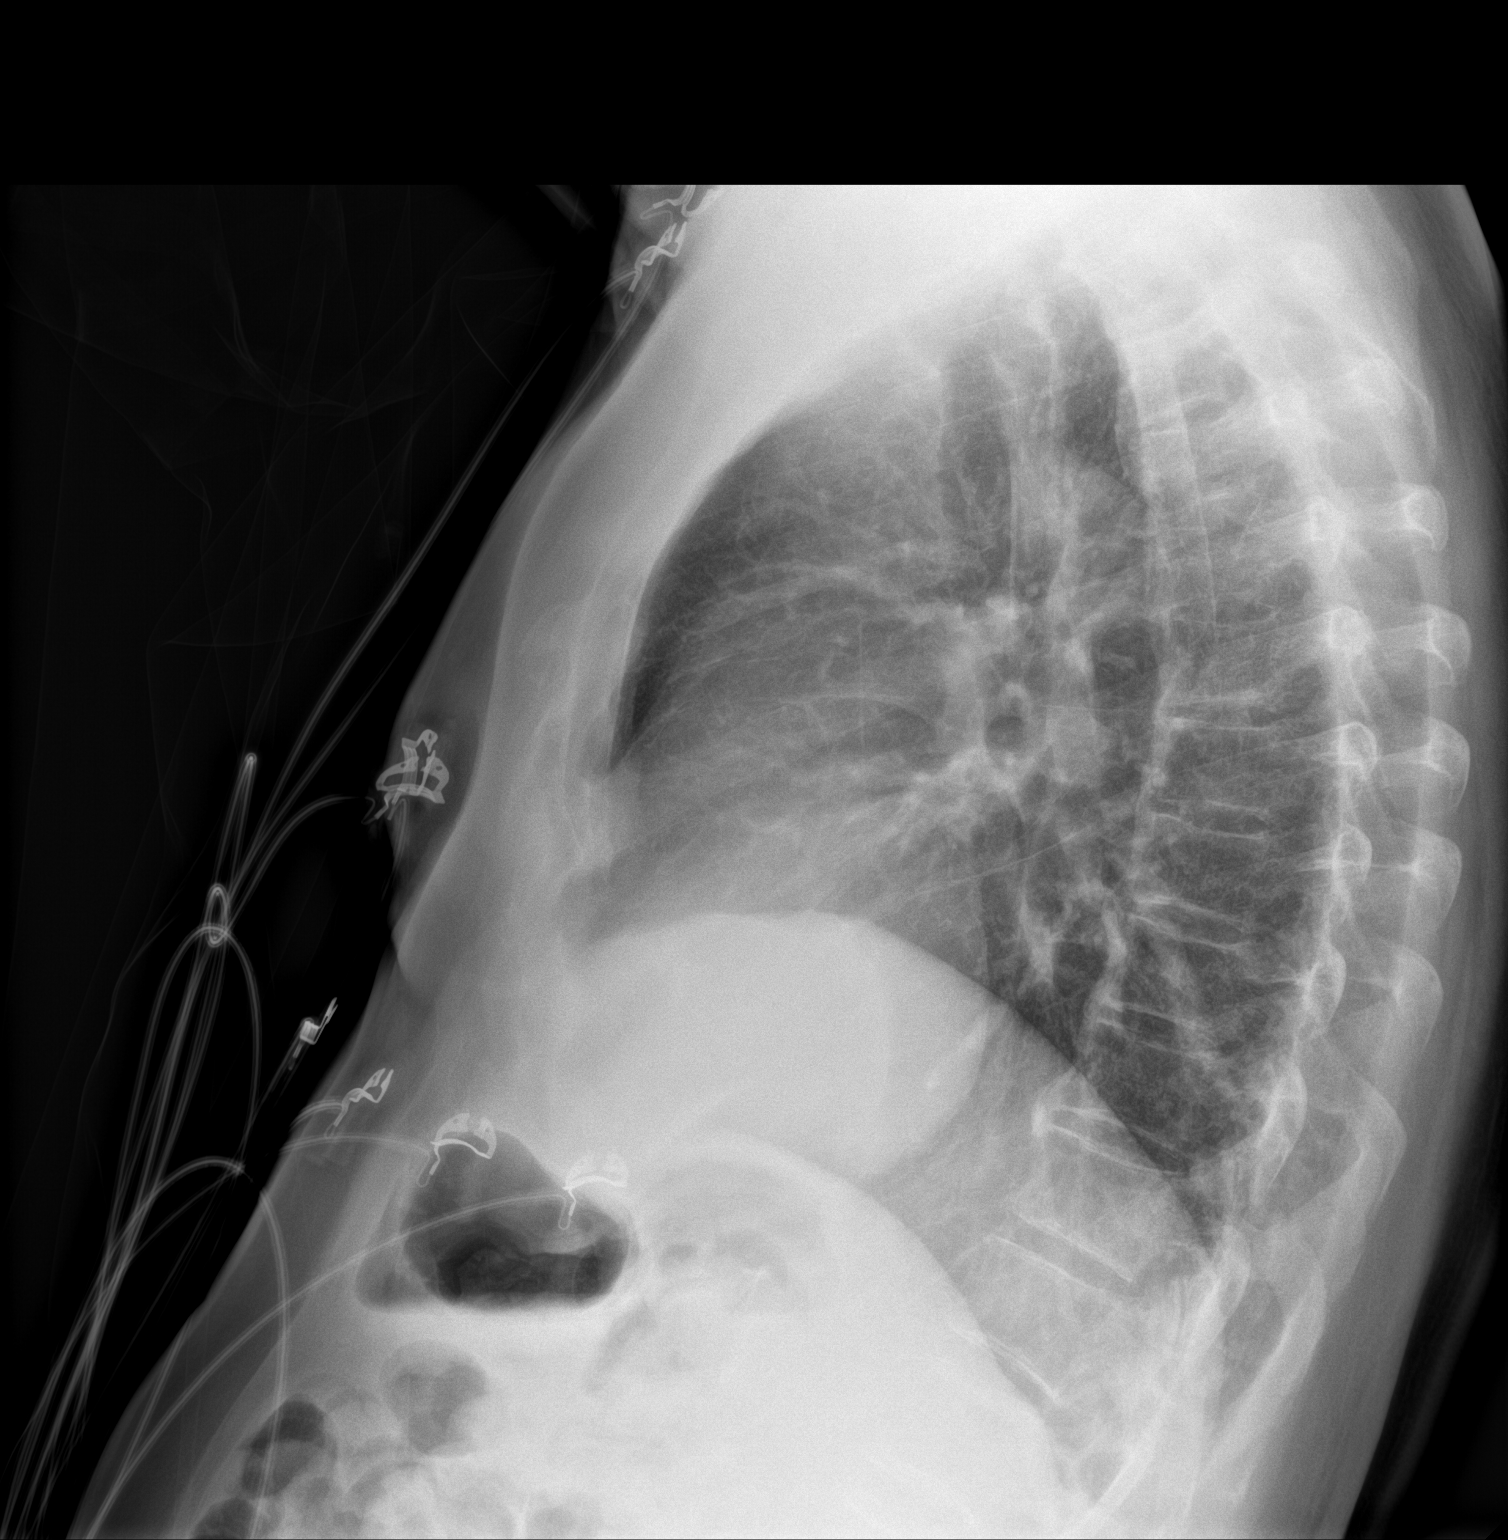

[2 of 2 positions shown; findings below may reference images not displayed]

FINDINGS: Lungs are adequately inflated without focal airspace consolidation
or effusion. Cardiomediastinal silhouette is normal. Minimal
elevation of the right hemidiaphragm. Spondylosis of the spine.
IMPRESSION: No active cardiopulmonary disease.

## 2023-06-02 MED ORDER — DOFETILIDE 250 MCG PO CAPS
250.0000 ug | ORAL_CAPSULE | Freq: Two times a day (BID) | ORAL | 11 refills | Status: DC
  Filled 2023-06-02: qty 60, 30d supply, fill #0

## 2023-06-02 MED ORDER — MAGNESIUM SULFATE 4 GM/100ML IV SOLN
4.0000 g | Freq: Once | INTRAVENOUS | Status: AC
  Administered 2023-06-02: 4 g via INTRAVENOUS
  Filled 2023-06-02: qty 100

## 2023-06-02 MED ORDER — MAGNESIUM OXIDE 400 MG PO TABS
400.0000 mg | ORAL_TABLET | Freq: Every day | ORAL | 11 refills | Status: DC
  Filled 2023-06-02: qty 30, 30d supply, fill #0

## 2023-06-02 NOTE — Progress Notes (Signed)
Pharmacy: Dofetilide (Tikosyn) - Follow Up Assessment and Electrolyte Replacement  Pharmacy consulted to assist in monitoring and replacing electrolytes in this 78 y.o. male admitted on 05/29/2023 undergoing dofetilide initiation. First dofetilide dose: 500 mcg 1/28 PM  Labs:    Component Value Date/Time   K 4.5 06/02/2023 0820   MG 1.7 06/02/2023 0820    Lab draw delayed this AM.  Plan: Potassium: K >/= 4: No additional supplementation needed  Magnesium: Mg 1.3-1.7: Give Mg 4 gm IV x1  Thank you for allowing pharmacy to participate in this patient's care   Kirk Ruths Paytes 06/02/2023  10:14 AM

## 2023-06-02 NOTE — Discharge Summary (Signed)
ELECTROPHYSIOLOGY DISCHARGE SUMMARY    Patient ID: Evan Robinson,  MRN: [id number], DOB/AGE: 31-Jan-1946 78 y.o.  Admit date: 05/29/2023 Discharge date: 06/02/2023  Primary Care Physician: Genice Rouge, MD  Primary Cardiologist: Chilton Si, MD  Electrophysiologist: Dr. Lalla Brothers   Primary Discharge Diagnosis:  1.  Persistent Atrial fibrillation status post Tikosyn loading this admission  Secondary Discharge Diagnosis:  High Risk Drug Monitoring  HTN OSA on CPAP  HLD   Allergies  Allergen Reactions   Asa [Aspirin] Other (See Comments)    Avoids due to Eliquis.   Nsaids Other (See Comments)    Avoids due to Eliquis.   Tricor [Fenofibrate] Other (See Comments)    Myalgias      Procedures This Admission:  1.  Tikosyn loading    Brief HPI: Evan Robinson is a 78 y.o. male with a past medical history as noted above.  They were referred to EP for treatment options of atrial fibrillation.  Risks, benefits, and alternatives to Tikosyn were reviewed with the patient who wished to proceed with admission for loading.  Hospital Course:  The patient was admitted and Tikosyn was initiated.  Renal function and electrolytes were followed during the hospitalization. He required magnesium supplementation during admission.  Their QTc remained stable. The patient converted chemically and did not require cardioversion. The patients QT prolonged, requiring dose reduction to 250 mcg BID. They were monitored on telemetry up to discharge. On the day of discharge, they were examined by Dr. Lalla Brothers  who considered them stable for discharge to home.  Follow-up has been arranged with the Atrial Fibrillation clinic in approximately 1 week.   Physical Exam: Vitals:   06/01/23 0820 06/01/23 1100 06/01/23 2135 06/02/23 0527  BP: 132/69 137/78 139/70 (!) 142/83  Pulse:  60 60 (!) 55  Resp: 18 18 20 18   Temp: 97.7 F (36.5 C) 97.8 F (36.6 C) 97.9 F (36.6 C) 98 F (36.7 C)  TempSrc:  Oral Oral Oral Oral  SpO2:  96% 97% 97%  Weight:      Height:        GEN- NAD, A&O x 3. Normal affect.  Lungs- CTAB, Normal effort.  Heart- Regular rate and rhythm. No M/G/R GI- Soft, NT, ND Extremities- No clubbing, cyanosis, or edema Skin- no rash or lesion  Labs:   Lab Results  Component Value Date   WBC 6.1 12/16/2022   HGB 12.7 (L) 12/16/2022   HCT 36.4 (L) 12/16/2022   MCV 86.9 12/16/2022   PLT 166 12/16/2022    Recent Labs  Lab 06/02/23 0820  NA 131*  K 4.5  CL 95*  CO2 26  BUN 12  CREATININE 0.95  CALCIUM 9.0  GLUCOSE 127*    Discharge Medications:  Allergies as of 06/02/2023       Reactions   Asa [aspirin] Other (See Comments)   Avoids due to Eliquis.   Nsaids Other (See Comments)   Avoids due to Eliquis.   Tricor [fenofibrate] Other (See Comments)   Myalgias         Medication List     TAKE these medications    acetaminophen 500 MG tablet Commonly known as: TYLENOL Take 1,000 mg by mouth daily as needed for headache or moderate pain.   ASHWAGANDHA PO Take by mouth.   b complex vitamins capsule Take 1 capsule by mouth daily.   baclofen 10 MG tablet Commonly known as: LIORESAL Take 10 mg by mouth as needed.   CLEAR  EYES FOR DRY EYES OP Place 1 drop into both eyes 3 (three) times daily as needed (dryness, irritation).   dofetilide 250 MCG capsule Commonly known as: TIKOSYN Take 1 capsule (250 mcg total) by mouth 2 (two) times daily.   Eliquis 5 MG Tabs tablet Generic drug: apixaban TAKE 1 TABLET(5 MG) BY MOUTH TWICE DAILY   gemfibrozil 600 MG tablet Commonly known as: LOPID Take 600 mg by mouth daily.   losartan 25 MG tablet Commonly known as: COZAAR TAKE 1 TABLET(25 MG) BY MOUTH EVERY DAY   Magnesium 400 MG Tabs Take 400 mg by mouth daily at 12 noon.   meclizine 25 MG tablet Commonly known as: ANTIVERT Take 1 tablet (25 mg total) by mouth 3 (three) times daily as needed for dizziness. What changed: how much to take    nebivolol 5 MG tablet Commonly known as: BYSTOLIC Take 0.5 tablets (2.5 mg total) by mouth daily. What changed: how much to take   pregabalin 100 MG capsule Commonly known as: LYRICA TAKE 1 CAPSULE(100 MG) BY MOUTH THREE TIMES DAILY   QC TUMERIC COMPLEX PO Take by mouth.   SUMAtriptan 50 MG tablet Commonly known as: Imitrex Take 1 tablet (50 mg total) by mouth once as needed for migraine. May repeat in 2 hours if headache persists or recurs.   VITAMIN C PO Take 1 tablet by mouth daily.   zolpidem 10 MG tablet Commonly known as: AMBIEN Take 10 mg by mouth at bedtime.        Disposition:  Home with follow up in AF clinic in 1 week as in AVS.   Duration of Discharge Encounter: Greater than 30 minutes including physician time.  Signed, Canary Brim, NP-C, AGACNP-BC [facility] - Electrophysiology  06/02/2023, 12:49 PM

## 2023-06-02 NOTE — Progress Notes (Signed)
   Electrophysiology Rounding Note - late entry.   Patient Name: Evan Robinson Date of Encounter: 06/01/23  Primary Cardiologist: Chilton Si, MD  Electrophysiologist: Lanier Prude, MD    Subjective   Pt remains in NSR on Tikosyn 500 mcg BID   QTc from EKG last pm shows prolonged QTc at 502 ms > dose reduced   The patient is doing well today.  At this time, the patient denies chest pain, shortness of breath, or any new concerns.  Inpatient Medications    Scheduled Meds:  apixaban  5 mg Oral BID   ascorbic acid  500 mg Oral Daily   B-complex with vitamin C  1 tablet Oral Daily   dofetilide  250 mcg Oral BID   gemfibrozil  600 mg Oral Daily   losartan  25 mg Oral Daily   nebivolol  5 mg Oral Daily   pregabalin  100 mg Oral TID   sodium chloride flush  3 mL Intravenous Q12H   zolpidem  10 mg Oral QHS   Continuous Infusions:  magnesium sulfate bolus IVPB 4 g (06/02/23 1118)   PRN Meds: acetaminophen, baclofen, meclizine, polyvinyl alcohol, sodium chloride flush, SUMAtriptan   Vital Signs    Vitals:   06/01/23 0820 06/01/23 1100 06/01/23 2135 06/02/23 0527  BP: 132/69 137/78 139/70 (!) 142/83  Pulse:  60 60 (!) 55  Resp: 18 18 20 18   Temp: 97.7 F (36.5 C) 97.8 F (36.6 C) 97.9 F (36.6 C) 98 F (36.7 C)  TempSrc: Oral Oral Oral Oral  SpO2:  96% 97% 97%  Weight:      Height:        Intake/Output Summary (Last 24 hours) at 06/02/2023 1242 Last data filed at 06/02/2023 0905 Gross per 24 hour  Intake 600 ml  Output --  Net 600 ml   Filed Weights   05/29/23 1552  Weight: 90.6 kg    Physical Exam    GEN- NAD, A&O x 3. Normal affect.  Lungs- CTAB, Normal effort.  Heart- Regular rate and rhythm. No M/G/R GI- Soft, NT, ND Extremities- No clubbing, cyanosis, or edema Skin- no rash or lesion  Labs    CBC No results for input(s): "WBC", "NEUTROABS", "HGB", "HCT", "MCV", "PLT" in the last 72 hours. Basic Metabolic Panel Recent Labs     06/01/23 0358 06/02/23 0820  NA 135 131*  K 4.3 4.5  CL 97* 95*  CO2 26 26  GLUCOSE 101* 127*  BUN 14 12  CREATININE 1.14 0.95  CALCIUM 9.0 9.0  MG 1.9 1.7    Telemetry    SB 50's to SR 60's, rare PVC (personally reviewed)  Patient Profile     Evan Robinson is a 78 y.o. male with a past medical history significant for persistent atrial fibrillation.  They were admitted for tikosyn load.   Assessment & Plan   Persistent atrial fibrillation Pt remains in NSR, QT increased on am EKG, Tikosyn reduced to 250 mcg BID  Plan to hold patient overnight, reviewed with patient indications for staying another night for monitoring  Continue Eliquis Creatinine 0.95 Potassium 4.3 Magnesium 1.9  Supplement Mg  Plan for home Friday if QTc remains stable.   For questions or updates, please contact [facility] Please consult [email or web address] for contact info under Cardiology/STEMI.  Signed, Canary Brim, NP-C, AGACNP-BC [facility] - Electrophysiology  06/02/2023, 12:46 PM

## 2023-06-02 NOTE — Progress Notes (Signed)
EKG from yesterday evening 06/01/2023 reviewed     Shows remains in NSR with stable QTc at 481 ms.  Continue  Tikosyn 250 mcg BID.   Potassium4.3 (01/30 0358) Magnesium  1.9 (01/30 0358) Creatinine, ser  1.14 (01/30 0358)  Plan for home Friday if QTc remains stable    Canary Brim, NP-C, AGACNP-BC [facility] - Electrophysiology  06/02/2023, 9:49 AM

## 2023-06-13 ENCOUNTER — Ambulatory Visit (HOSPITAL_COMMUNITY)
Admit: 2023-06-13 | Discharge: 2023-06-13 | Disposition: A | Discharge status: Home / Self Care | Payer: Medicare PPO | Attending: Internal Medicine | Admitting: Internal Medicine

## 2023-06-13 VITALS — BP 144/80 | HR 57 | Ht 72.0 in | Wt 194.4 lb

## 2023-06-13 DIAGNOSIS — G4733 Obstructive sleep apnea (adult) (pediatric): Secondary | ICD-10-CM | POA: Diagnosis not present

## 2023-06-13 DIAGNOSIS — I491 Atrial premature depolarization: Secondary | ICD-10-CM | POA: Diagnosis not present

## 2023-06-13 DIAGNOSIS — Z7901 Long term (current) use of anticoagulants: Secondary | ICD-10-CM | POA: Insufficient documentation

## 2023-06-13 DIAGNOSIS — D6869 Other thrombophilia: Secondary | ICD-10-CM | POA: Diagnosis not present

## 2023-06-13 DIAGNOSIS — Z5181 Encounter for therapeutic drug level monitoring: Secondary | ICD-10-CM | POA: Diagnosis not present

## 2023-06-13 DIAGNOSIS — I4819 Other persistent atrial fibrillation: Secondary | ICD-10-CM | POA: Insufficient documentation

## 2023-06-13 DIAGNOSIS — I110 Hypertensive heart disease with heart failure: Secondary | ICD-10-CM | POA: Diagnosis not present

## 2023-06-13 DIAGNOSIS — Z79899 Other long term (current) drug therapy: Secondary | ICD-10-CM | POA: Diagnosis not present

## 2023-06-13 DIAGNOSIS — I2510 Atherosclerotic heart disease of native coronary artery without angina pectoris: Secondary | ICD-10-CM | POA: Insufficient documentation

## 2023-06-13 LAB — BASIC METABOLIC PANEL
Anion gap: 6 (ref 5–15)
BUN: 10 mg/dL (ref 8–23)
CO2: 28 mmol/L (ref 22–32)
Calcium: 9.2 mg/dL (ref 8.9–10.3)
Chloride: 98 mmol/L (ref 98–111)
Creatinine, Ser: 0.9 mg/dL (ref 0.61–1.24)
GFR, Estimated: >60 mL/min (ref >60)
Glucose, Bld: 122 mg/dL — ABNORMAL HIGH (ref 70–99)
Potassium: 4.1 mmol/L (ref 3.5–5.1)
Sodium: 132 mmol/L — ABNORMAL LOW (ref 135–145)

## 2023-06-13 LAB — MAGNESIUM: Magnesium: 1.8 mg/dL (ref 1.7–2.4)

## 2023-06-13 NOTE — Progress Notes (Signed)
Primary Care Physician: Genice Rouge, MD Primary Cardiologist: Chilton Si, MD Electrophysiologist: Lanier Prude, MD  Referring Physician: [facility]   Evan Robinson is a 78 y.o. male with a history of HTN, OSA, atrial flutter, atrial fibrillation who presents for follow up in the [facility]. The patient was initially maintained on flecainide and underwent afib ablation 07/31/22. He had recurrence of his afib and his flecainide was resumed and he underwent repeat afib ablation and CTI ablation on 10/25/22. Patient is on Eliquis for a CHADS2VASC score of 4.  Patient returns for follow up for atrial fibrillation and dofetilide admission. He reports that he has been in persistent afib since November. He has symptoms of fatigue and palpitations. He denies any missed doses of anticoagulation in the past 3 weeks.   On follow up 06/13/23, he is currently in NSR. He is here for 1 week Tikosyn surveillance. He is s/p Tikosyn admission 1/27-31/25. He converted chemically to normal rhythm. He required dose reduction to 250 mcg BID due to prolonged QT interval. He was discharged on magnesium supplementation. He has had no episodes of Afib since hospital discharge. No missed doses of Tikosyn or Eliquis.   Today, he denies symptoms of chest pain, orthopnea, PND, lower extremity edema, dizziness, presyncope, syncope, snoring, daytime somnolence, bleeding, or neurologic sequela. The patient is tolerating medications without difficulties and is otherwise without complaint today.    Atrial Fibrillation Risk Factors:  he does have symptoms or diagnosis of sleep apnea. he does not have a history of rheumatic fever.   Atrial Fibrillation Management history:  Previous antiarrhythmic drugs: flecainide, tikosyn Previous cardioversions: none Previous ablations: 07/30/21 Anticoagulation history: Eliquis  ROS- All systems are reviewed and negative except as per the HPI  above.   Physical Exam: BP (!) 144/80   Pulse (!) 57   Ht 6' (1.829 m)   Wt 88.2 kg   BMI 26.37 kg/m   GEN- The patient is well appearing, alert and oriented x 3 today.   Neck - no JVD or carotid bruit noted Lungs- Clear to ausculation bilaterally, normal work of breathing Heart- Regular rate and rhythm, no murmurs, rubs or gallops, PMI not laterally displaced Extremities- no clubbing, cyanosis, or edema Skin - no rash or ecchymosis noted   Wt Readings from Last 3 Encounters:  06/13/23 88.2 kg  05/29/23 90.6 kg  05/29/23 89.6 kg     EKG today demonstrates  Vent. rate 57 BPM PR interval 172 ms QRS duration 108 ms QT/QTcB 462/449 ms P-R-T axes 83 -43 -31 Sinus bradycardia with Premature atrial complexes Left axis deviation Low voltage QRS Cannot rule out Anterior infarct , age undetermined Abnormal ECG When compared with ECG of 02-Jun-2023 10:39, PREVIOUS ECG IS PRESENT  Echo 12/16/22 demonstrated   1. LVEF is unchanged compared to images from study donw in 2023 . Left  ventricular ejection fraction, by estimation, is 45 to 50%. The left  ventricle has mildly decreased function. The left ventricular internal  cavity size was severely dilated. Left  ventricular diastolic parameters were normal.   2. Right ventricular systolic function is mildly reduced. The right  ventricular size is mildly enlarged.   3. Mild mitral valve regurgitation. 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   4. The aortic valve is tricuspid. Aortic valve regurgitation is not  visualized. Aortic valve sclerosis/calcification is present, without any  evidence of aortic stenosis.    CHA2DS2-VASc  Score = 5  The patient's score is based upon: CHF History: 1 (HFmrEF) HTN History: 1 Diabetes History: 0 Stroke History: 0 Vascular Disease History: 1 Age Score: 2 Gender Score: 0      ASSESSMENT AND PLAN: Persistent Atrial Fibrillation (ICD10:  I48.19) The patient's  CHA2DS2-VASc score is 5, indicating a 7.2% annual risk of stroke.   S/p afib ablation 07/30/21 and afib and flutter ablation 10/25/22. S/p dofetilide admission 1/27-31/25.  He is currently in NSR.  High risk medication monitoring (ICD10: R7229428) Patient requires ongoing monitoring for anti-arrhythmic medication which has the potential to cause life threatening arrhythmias or AV block. Qtc stable. Continue Tikosyn 250 mcg BID. Bmet and mag drawn today.   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  Continue Apixaban (Eliquis).  No missed doses.   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    Justin Mend, PA-C Afib Clinic [facility] [address] [place], [place] [address] [phone]

## 2023-06-19 ENCOUNTER — Other Ambulatory Visit (HOSPITAL_COMMUNITY): Payer: Self-pay

## 2023-06-19 MED ORDER — DOFETILIDE 250 MCG PO CAPS: 250.0000 ug | ORAL_CAPSULE | Freq: Two times a day (BID) | ORAL | Status: DC

## 2023-06-22 ENCOUNTER — Telehealth (HOSPITAL_BASED_OUTPATIENT_CLINIC_OR_DEPARTMENT_OTHER): Payer: Self-pay | Admitting: *Deleted

## 2023-06-22 NOTE — Telephone Encounter (Signed)
 Received notification from [facility] patients Bystolic not covered/quantity limit Will forward to prior auth team to initiate PA

## 2023-06-23 ENCOUNTER — Telehealth: Payer: Self-pay | Admitting: Pharmacy Technician

## 2023-06-23 ENCOUNTER — Other Ambulatory Visit (HOSPITAL_COMMUNITY): Payer: Self-pay

## 2023-06-23 NOTE — Telephone Encounter (Signed)
 June 23, 2023 Art Buff, CPhT to Me     06/23/23 11:08 AM PA request not needed at this time. Test claim says too soon to refill- available on 06/27/23 and PA says available without authorization.

## 2023-06-23 NOTE — Telephone Encounter (Signed)
 Pharmacy Patient Advocate Encounter   Received notification from Pt Calls Messages that prior authorization for Nebivolol is required/requested.   Insurance verification completed.   The patient is insured through [employer] .   Per test claim: Refill too soon. PA is not needed at this time. Medication was filled 06/04/23. Next eligible fill date is 06/27/23.

## 2023-06-26 ENCOUNTER — Other Ambulatory Visit (HOSPITAL_BASED_OUTPATIENT_CLINIC_OR_DEPARTMENT_OTHER): Payer: Self-pay

## 2023-06-26 ENCOUNTER — Other Ambulatory Visit (HOSPITAL_COMMUNITY): Payer: Self-pay | Admitting: *Deleted

## 2023-06-26 MED ORDER — MAGNESIUM OXIDE 400 MG PO TABS
400.0000 mg | ORAL_TABLET | Freq: Every day | ORAL | 2 refills | Status: DC
Start: 2023-06-26 — End: 2024-03-23
  Filled 2023-06-26: qty 90, 90d supply, fill #0
  Filled 2023-09-25: qty 90, 90d supply, fill #1
  Filled 2023-12-25: qty 90, 90d supply, fill #2

## 2023-06-26 MED ORDER — DOFETILIDE 250 MCG PO CAPS
250.0000 ug | ORAL_CAPSULE | Freq: Two times a day (BID) | ORAL | 1 refills | Status: DC
  Filled 2023-06-26 – 2023-06-27 (×2): qty 180, 90d supply, fill #0
  Filled 2023-09-25: qty 180, 90d supply, fill #1

## 2023-06-27 ENCOUNTER — Other Ambulatory Visit (HOSPITAL_BASED_OUTPATIENT_CLINIC_OR_DEPARTMENT_OTHER): Payer: Self-pay

## 2023-07-11 ENCOUNTER — Ambulatory Visit (HOSPITAL_COMMUNITY)
Admission: RE | Admit: 2023-07-11 | Discharge: 2023-07-11 | Disposition: A | Discharge status: Home / Self Care | Payer: Medicare PPO | Source: Ambulatory Visit | Attending: Internal Medicine | Admitting: Internal Medicine

## 2023-07-11 ENCOUNTER — Inpatient Hospital Stay (HOSPITAL_COMMUNITY)
Admission: RE | Admit: 2023-07-11 | Discharge: 2023-07-11 | Disposition: A | Discharge status: Home / Self Care | Source: Ambulatory Visit | Attending: Internal Medicine | Admitting: Internal Medicine

## 2023-07-11 VITALS — BP 84/66 | HR 107 | Ht 72.0 in | Wt 194.8 lb

## 2023-07-11 DIAGNOSIS — I4819 Other persistent atrial fibrillation: Secondary | ICD-10-CM | POA: Diagnosis present

## 2023-07-11 DIAGNOSIS — I110 Hypertensive heart disease with heart failure: Secondary | ICD-10-CM | POA: Diagnosis not present

## 2023-07-11 DIAGNOSIS — Z79899 Other long term (current) drug therapy: Secondary | ICD-10-CM | POA: Insufficient documentation

## 2023-07-11 DIAGNOSIS — I4891 Unspecified atrial fibrillation: Secondary | ICD-10-CM | POA: Diagnosis not present

## 2023-07-11 DIAGNOSIS — D6869 Other thrombophilia: Secondary | ICD-10-CM | POA: Insufficient documentation

## 2023-07-11 DIAGNOSIS — I5022 Chronic systolic (congestive) heart failure: Secondary | ICD-10-CM | POA: Diagnosis not present

## 2023-07-11 DIAGNOSIS — Z5181 Encounter for therapeutic drug level monitoring: Secondary | ICD-10-CM | POA: Insufficient documentation

## 2023-07-11 DIAGNOSIS — Z7901 Long term (current) use of anticoagulants: Secondary | ICD-10-CM | POA: Diagnosis not present

## 2023-07-11 DIAGNOSIS — G4733 Obstructive sleep apnea (adult) (pediatric): Secondary | ICD-10-CM | POA: Diagnosis not present

## 2023-07-11 DIAGNOSIS — I959 Hypotension, unspecified: Secondary | ICD-10-CM | POA: Diagnosis not present

## 2023-07-11 LAB — BASIC METABOLIC PANEL
Anion gap: 10 (ref 5–15)
BUN: 19 mg/dL (ref 8–23)
CO2: 26 mmol/L (ref 22–32)
Calcium: 9.4 mg/dL (ref 8.9–10.3)
Chloride: 97 mmol/L — ABNORMAL LOW (ref 98–111)
Creatinine, Ser: 1.55 mg/dL — ABNORMAL HIGH (ref 0.61–1.24)
GFR, Estimated: 46 mL/min — ABNORMAL LOW (ref >60)
Glucose, Bld: 92 mg/dL (ref 70–99)
Potassium: 4.4 mmol/L (ref 3.5–5.1)
Sodium: 133 mmol/L — ABNORMAL LOW (ref 135–145)

## 2023-07-11 LAB — MAGNESIUM: Magnesium: 2 mg/dL (ref 1.7–2.4)

## 2023-07-11 NOTE — Progress Notes (Signed)
 Primary Care Physician: Genice Rouge, MD Primary Cardiologist: Chilton Si, MD Electrophysiologist: Lanier Prude, MD  Referring Physician: [facility]   Evan Robinson is a 78 y.o. male with a history of HTN, OSA, atrial flutter, atrial fibrillation who presents for follow up in the [facility]. The patient was initially maintained on flecainide and underwent afib ablation 07/31/22. He had recurrence of his afib and his flecainide was resumed and he underwent repeat afib ablation and CTI ablation on 10/25/22. Patient is on Eliquis for a CHADS2VASC score of 4.  Patient returns for follow up for atrial fibrillation and dofetilide admission. He reports that he has been in persistent afib since November. He has symptoms of fatigue and palpitations. He denies any missed doses of anticoagulation in the past 3 weeks.   On follow up 06/13/23, he is currently in NSR. He is here for 1 week Tikosyn surveillance. He is s/p Tikosyn admission 1/27-31/25. He converted chemically to normal rhythm. He required dose reduction to 250 mcg BID due to prolonged QT interval. He was discharged on magnesium supplementation. He has had no episodes of Afib since hospital discharge. No missed doses of Tikosyn or Eliquis.   On follow up 07/11/22, he is here for 1 month Tikosyn surveillance. He is currently in Afib. He states it began possibly yesterday; unclear if going in and out of rhythm. No missed doses of Tikosyn or Eliquis.  Today, he denies symptoms of chest pain, orthopnea, PND, lower extremity edema, dizziness, presyncope, syncope, snoring, daytime somnolence, bleeding, or neurologic sequela. The patient is tolerating medications without difficulties and is otherwise without complaint today.    Atrial Fibrillation Risk Factors:  he does have symptoms or diagnosis of sleep apnea. he does not have a history of rheumatic fever.   Atrial Fibrillation Management  history:  Previous antiarrhythmic drugs: flecainide, tikosyn Previous cardioversions: none Previous ablations: 07/30/21 Anticoagulation history: Eliquis  ROS- All systems are reviewed and negative except as per the HPI above.   Physical Exam: BP (!) 84/66   Pulse (!) 107   Ht 6' (1.829 m)   Wt 88.4 kg   BMI 26.42 kg/m   GEN- The patient is well appearing, alert and oriented x 3 today.   Neck - no JVD or carotid bruit noted Lungs- Clear to ausculation bilaterally, normal work of breathing Heart- Irregular rate and rhythm, no murmurs, rubs or gallops, PMI not laterally displaced Extremities- no clubbing, cyanosis, or edema Skin - no rash or ecchymosis noted   Wt Readings from Last 3 Encounters:  07/11/23 88.4 kg  06/13/23 88.2 kg  05/29/23 90.6 kg     EKG today demonstrates  Vent. rate 107 BPM PR interval * ms QRS duration 104 ms QT/QTcB 364/485 ms P-R-T axes * -41 -29 Atrial fibrillation with rapid ventricular response Left axis deviation Low voltage QRS Cannot rule out Anterior infarct , age undetermined Abnormal ECG When compared with ECG of 13-Jun-2023 10:08, PREVIOUS ECG IS PRESENT  Echo 12/16/22 demonstrated   1. LVEF is unchanged compared to images from study donw in 2023 . Left  ventricular ejection fraction, by estimation, is 45 to 50%. The left  ventricle has mildly decreased function. The left ventricular internal  cavity size was severely dilated. Left  ventricular diastolic parameters were normal.   2. Right ventricular systolic function is mildly reduced. The right  ventricular size is mildly enlarged.   3. Mild mitral valve regurgitation. There is moderate prolapse  of both  leaflets of the mitral valve.   4. The aortic valve is tricuspid. Aortic valve regurgitation is not  visualized. Aortic valve sclerosis/calcification is present, without any  evidence of aortic stenosis.    CHA2DS2-VASc Score = 5  The patient's score is based upon: CHF  History: 1 (HFmrEF) HTN History: 1 Diabetes History: 0 Stroke History: 0 Vascular Disease History: 1 Age Score: 2 Gender Score: 0      ASSESSMENT AND PLAN: Persistent Atrial Fibrillation (ICD10:  I48.19) The patient's CHA2DS2-VASc score is 5, indicating a 7.2% annual risk of stroke.   S/p afib ablation 07/30/21 and afib and flutter ablation 10/25/22. S/p dofetilide admission 1/27-31/25.  He is currently in Afib. Due to hypotension, will have patient hold losartan while in Afib. Drink fluids. Will place 1 week cardiac monitor to determine if paroxysmal or persistent. If persistent, will arrange for cardioversion.   High risk medication monitoring (ICD10: R7229428) Patient requires ongoing monitoring for anti-arrhythmic medication which has the potential to cause life threatening arrhythmias or AV block. Qtc stable. Continue Tikosyn 250 mcg BID. Bmet and mag drawn today.  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  Continue Apixaban (Eliquis).  Continue without interruption.  OSA  He is compliant with CPAP.   HTN Very low today, hold losartan.  CAD CAC score 698 No chest pain reported.  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   Follow up in 3 months for Tikosyn surveillance.    Justin Mend, PA-C Afib Clinic [facility] [address] [place], [place] [address] [phone]

## 2023-07-13 ENCOUNTER — Ambulatory Visit (HOSPITAL_COMMUNITY): Payer: Medicare PPO | Admitting: Internal Medicine

## 2023-08-06 ENCOUNTER — Other Ambulatory Visit: Payer: Self-pay | Admitting: Physical Medicine and Rehabilitation

## 2023-08-07 ENCOUNTER — Telehealth: Payer: Self-pay

## 2023-08-07 NOTE — Telephone Encounter (Addendum)
 ERROR

## 2023-08-11 ENCOUNTER — Encounter (HOSPITAL_COMMUNITY): Payer: Self-pay

## 2023-08-22 ENCOUNTER — Encounter: Payer: Medicare PPO | Attending: Physical Medicine and Rehabilitation | Admitting: Physical Medicine and Rehabilitation

## 2023-08-22 ENCOUNTER — Encounter: Payer: Self-pay | Admitting: Physical Medicine and Rehabilitation

## 2023-08-22 VITALS — BP 152/89 | HR 63 | Ht 72.0 in | Wt 193.0 lb

## 2023-08-22 DIAGNOSIS — G43809 Other migraine, not intractable, without status migrainosus: Secondary | ICD-10-CM | POA: Diagnosis present

## 2023-08-22 DIAGNOSIS — M792 Neuralgia and neuritis, unspecified: Secondary | ICD-10-CM | POA: Insufficient documentation

## 2023-08-22 DIAGNOSIS — G894 Chronic pain syndrome: Secondary | ICD-10-CM | POA: Diagnosis not present

## 2023-08-22 MED ORDER — CAPSAICIN-CLEANSING GEL 8 % EX KIT
2.0000 | PACK | Freq: Once | CUTANEOUS | Status: AC
  Administered 2023-08-22: 2 via TOPICAL

## 2023-08-22 MED ORDER — SUMATRIPTAN SUCCINATE 50 MG PO TABS: 50.0000 mg | ORAL_TABLET | Freq: Once | ORAL | 2 refills | Status: AC | PRN

## 2023-08-22 NOTE — Patient Instructions (Signed)
 Foods that may reduce pain: 1) Ginger (especially studied for arthritis)- reduce leukotriene production to decrease inflammation 2) Blueberries- high in phytonutrients that decrease inflammation 3) Salmon- marine omega-3s reduce joint swelling and pain 4) Pumpkin seeds- reduce inflammation 5) dark chocolate- reduces inflammation 6) turmeric- reduces inflammation 7) tart cherries - reduce pain and stiffness 8) extra virgin olive oil - its compound olecanthal helps to block prostaglandins  9) chili peppers- can be eaten or applied topically via capsaicin  10) mint- helpful for headache, muscle aches, joint pain, and itching 11) garlic- reduces inflammation 12) Green tea- reduces inflammation and oxidative stress, helps with weight loss, may reduce the risk of cancer, recommend Double [employer] of Tea daily  Link to further information on diet for chronic pain: http://www.bray.com/   Foods that can help lower blood pressure and provided with a list: 1) citrus foods- high in vitamins and minerals 2) salmon and other fatty fish - reduces inflammation and oxylipins 3) swiss chard (leafy green)- high level of nitrates 4) pumpkin seeds- one of the best natural sources of magnesium  5) Beans and lentils- high in fiber, magnesium , and potassium 6) Berries- high in flavonoids 7) Amaranth (whole grain, can be cooked similarly to rice and oats)- high in magnesium  and fiber 8) Pistachios- even more effective at reducing BP than other nuts 9) Carrots- high in phenolic compounds that relax blood vessels and reduce inflammation 10) Celery- contain phthalides that relax tissues of arterial walls 11) Tomatoes- can also improve cholesterol and reduce risk of heart disease 12) Broccoli- good source of magnesium , calcium, and potassium 13) Greek yogurt: high in potassium and calcium 14) Herbs and spices: Celery seed, cilantro,  saffron, lemongrass, black cumin, ginseng, cinnamon, cardamom, sweet basil, and ginger 15) Chia and flax seeds- also help to lower cholesterol and blood sugar 16) Beets- high levels of nitrates that relax blood vessels  17) spinach and bananas- high in potassium  -Provided lise of supplements that can help with hypertension:  1) magnesium : one high quality brand is Bi[employer]em[employer] since it contains all 7 types of magnesium , otherwise over the counter magnesium  gluconate 400mg  is a good option 2) B vitamins 3) vitamin D  4) potassium 5) CoQ10 6) L-arginine 7) Vitamin C  8) Beetroot -Educated that goal BP is 120/80. -Made goal to incorporate some of the above foods into diet.

## 2023-08-22 NOTE — Progress Notes (Signed)
 Subjective:    Patient ID: Evan Robinson, male    DOB: 07-10-1945, 78 y.o.   MRN: [id number]   HPI:  1) Neuropathy:  Evan Robinson is a 78 y.o. male who presents for f/u of facial pain, shoulder pain, and capelike distribution of upper back and neck pain.  -has had ongoing benefits from Qutenza  -pain is currently 6/10 today -he tries to ignore the pain -last time he received a $250 bill for Qutenza  treatment though he usually receives $40 bill -ready to try again today -does not need medication refills -has been able to enjoy vacations with his wife.  -we previously weaned lyrica  to 75mg  -He reports he has facial pain and neck pain radiating into his bilateral shoulders  -his pain is much better controlled since we have started applying the Qutenza  patches -he has felt that these and the turmeric have been most helpful for his pain -he does feel burning and tingling during the first couple of days after application but this dissapates shortly afterward -he requests ice packs to be applied immediately after application -he would like to repeat Qutenza  today -he would like to try ice packs with application -he does not need refill of lyrica  today -he had a good trip with his wife to [place] recently -he plans to travel with her more, moving and staying busy helps his pain He current has no sunburns -he has plans to travel this summer with his wife -he maintains on his prior medications and does not need any further refills today He has burning in left sided facial pain and shoulder It is worst with friction and exacerbated with lying down.  He has been on the Lyrica  for years. He is currently 100mg  BID and he is able to tolerate this dose well. He had dizziness with higher doses He has not had cognitive side effects He did well with the memory recall.  He has never tried CBD oil.  Pain worsens with shaving He tried aloe vera without great benefit He is excited to try Qutenza   today- it gave him about 1 month of relief last visit.  He is not sure if he has tried carbamazepine  Capsaicin  made his skin burn and raw in the past.  -He is interested in trying medical marijuana if it gets approved.  -He asks for a refill of Lyrica  -He is ready for Qutenza  today -Had a good Christmas and New Year's Family  -he does have some dizziness -had too many side effects from gabapentin   2) Impaired balance:  -would like to try topamax  instead of Lyrica  as decreasing the Lyrica  improved his balance  3) HTN -BP is 152/89    Pain Inventory Average Pain 8 Pain Right Now 7 My pain is burning  In the last 24 hours, has pain interfered with the following? General activity 7 Relation with others 8 Enjoyment of life 7 What TIME of day is your pain at its worst? evening and night Sleep (in general) Fair  Pain is worse with: sitting Pain improves with: medication Relief from Meds: 4  Family History  Problem Relation Age of Onset   Alcohol  abuse Father    Cancer Father        prostate   CAD Father        possible MI   Alcohol  abuse Paternal Grandfather    Stroke Mother    Diabetes Neg Hx    Social History   Socioeconomic History   Marital status: Married  Spouse name: Not on file   Number of children: Not on file   Years of education: Not on file   Highest education level: Not on file  Occupational History   Not on file  Tobacco Use   Smoking status: Never   Smokeless tobacco: Never   Tobacco comments:    Never smoked 05/29/23  Vaping Use   Vaping status: Never Used  Substance and Sexual Activity   Alcohol  use: No   Drug use: No   Sexual activity: Not on file  Other Topics Concern   Not on file  Social History Narrative   "Gene"   Lives with wife   Occupation: retired, was Dietitian   Activity: walks daily, some gym   Diet:    Social Drivers of Corporate investment banker Strain: Not on file  Food Insecurity: No Food Insecurity  (05/29/2023)   Hunger Vital Sign    Worried About Running Out of Food in the Last Year: Never true    Ran Out of Food in the Last Year: Never true  Transportation Needs: No Transportation Needs (05/29/2023)   PRAPARE - Administrator, Civil Service (Medical): No    Lack of Transportation (Non-Medical): No  Physical Activity: Not on file  Stress: Not on file  Social Connections: Socially Integrated (05/29/2023)   Social Connection and Isolation Panel [NHANES]    Frequency of Communication with Friends and Family: More than three times a week    Frequency of Social Gatherings with Friends and Family: More than three times a week    Attends Religious Services: More than 4 times per year    Active Member of Clubs or Organizations: Yes    Attends Banker Meetings: More than 4 times per year    Marital Status: Married   Past Surgical History:  Procedure Laterality Date   ATRIAL FIBRILLATION ABLATION N/A 07/30/2021   Procedure: ATRIAL FIBRILLATION ABLATION;  Surgeon: Boyce Byes, MD;  Location: [facility];  Service: Cardiovascular;  Laterality: N/A;   ATRIAL FIBRILLATION ABLATION N/A 10/25/2022   Procedure: ATRIAL FIBRILLATION ABLATION;  Surgeon: Boyce Byes, MD;  Location: [facility];  Service: Cardiovascular;  Laterality: N/A;   CHOLECYSTECTOMY  2006   COLONOSCOPY WITH PROPOFOL  N/A 02/01/2017   Procedure: COLONOSCOPY WITH PROPOFOL ;  Surgeon: Toledo, Alphonsus Jeans, MD;  Location: [facility];  Service: Gastroenterology;  Laterality: N/A;   ESOPHAGOGASTRODUODENOSCOPY (EGD) WITH PROPOFOL  N/A 02/01/2017   Procedure: ESOPHAGOGASTRODUODENOSCOPY (EGD) WITH PROPOFOL ;  Surgeon: Toledo, Alphonsus Jeans, MD;  Location: [facility];  Service: Gastroenterology;  Laterality: N/A;   HERNIA REPAIR  08/2010   PROSTATECTOMY  2003   Past Surgical History:  Procedure Laterality Date   ATRIAL FIBRILLATION ABLATION N/A 07/30/2021   Procedure: ATRIAL FIBRILLATION  ABLATION;  Surgeon: Boyce Byes, MD;  Location: [facility];  Service: Cardiovascular;  Laterality: N/A;   ATRIAL FIBRILLATION ABLATION N/A 10/25/2022   Procedure: ATRIAL FIBRILLATION ABLATION;  Surgeon: Boyce Byes, MD;  Location: [facility];  Service: Cardiovascular;  Laterality: N/A;   CHOLECYSTECTOMY  2006   COLONOSCOPY WITH PROPOFOL  N/A 02/01/2017   Procedure: COLONOSCOPY WITH PROPOFOL ;  Surgeon: Toledo, Alphonsus Jeans, MD;  Location: [facility];  Service: Gastroenterology;  Laterality: N/A;   ESOPHAGOGASTRODUODENOSCOPY (EGD) WITH PROPOFOL  N/A 02/01/2017   Procedure: ESOPHAGOGASTRODUODENOSCOPY (EGD) WITH PROPOFOL ;  Surgeon: Toledo, Alphonsus Jeans, MD;  Location: [facility];  Service: Gastroenterology;  Laterality: N/A;  HERNIA REPAIR  08/2010   PROSTATECTOMY  2003   Past Medical History:  Diagnosis Date   Dyslipidemia    H/O prostate cancer 05/02/2001   s/p radical prostatectomy (uro Dr. Asenath Blacker in [place] yearly)   Headache 05/02/2010   thorough w/u - unrevealing.  has seen 3 neurologists, MRI, LP nonacute.  ESR = 3   HTN (hypertension)    Hypogonadism male    Low testosterone     Migraines    OSA on CPAP    Persistent atrial fibrillation (HCC) 06/28/2022   Renal cyst 12/01/2010   complex cyst upper pole L kidney with no malignancy by MRI   Vertigo    Vitamin D  deficiency    BP (!) 152/89   Pulse 63   Ht 6' (1.829 m)   Wt 193 lb (87.5 kg)   SpO2 98%   BMI 26.18 kg/m   Opioid Risk Score:   Fall Risk Score:  `1  Depression screen [facility] 2/9     08/22/2023    9:30 AM 05/19/2023    9:45 AM 02/16/2023    9:32 AM 08/15/2022    9:52 AM 02/07/2022   10:16 AM 11/15/2021    1:51 PM 08/26/2021    9:30 AM  Depression screen PHQ 2/9  Decreased Interest 0 0 0 0 0 0 0  Down, Depressed, Hopeless 0 0 0 0 0 0 0  PHQ - 2 Score 0 0 0 0 0 0 0      Review of Systems  Constitutional: Negative.   HENT: Negative.    Eyes: Negative.   Respiratory: Negative.     Cardiovascular: Negative.   Gastrointestinal: Negative.   Endocrine: Negative.   Musculoskeletal:  Positive for neck pain.       Nerve pain , pain in shoulders ,   Skin: Negative.   Allergic/Immunologic: Negative.   Neurological: Negative.   Hematological: Negative.   Psychiatric/Behavioral: Negative.         Objective:   Physical Exam Vitals and nursing note reviewed. BMI 26.18, BP 144/76, weight 193 lbs Constitutional:      Appearance: Normal appearance.  Cardiovascular:     Rate and Rhythm: Normal rate and regular rhythm.     Pulses: Normal pulses.     Heart sounds: Normal heart sounds.  Pulmonary:     Effort: Pulmonary effort is normal.     Breath sounds: Normal breath sounds.  Musculoskeletal:     Cervical back: Normal range of motion and neck supple.     Comments: Normal Muscle Bulk and Muscle Testing Reveals:  Upper Extremities: Full ROM and Muscle Strength  5/5 Lower Extremities: Full ROM and Muscle Strength 5/5 Arises from chair with ease Narrow Based  Gait  Tight cervical myofascia Forward protracted posture, exam stable 4/22 Skin:    General: Skin is warm and dry. No open lesions over back or shoulders. No sunburn, erythema after treatment Neurological:     Mental Status: He is alert and oriented to person, place, and time.  Psychiatric:        Mood and Affect: Mood normal.        Behavior: Behavior normal.         Assessment & Plan:   1. 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             Lidoderm  patch/ointment, Zyrtec, Tomapax, Almond milk, Elavil , Ketamine infusion (cound not tolerate), hypotherapy, compound medication, hypnosis without benefit              Unable to trial carbamazepine  as patient is on Eliquis              Steroids made symptoms more severe             Side effects with Cymbalta, oxcarbazepine , Pamelor              CompletedPhysical Therapy two days a week. Continue HEP            Continue Lyrica  to 100/100/75, add topamax  25mg  in the evening Pain. Continue HEP as Tolerated and Continue to Monitor.   Turmeric to reduce inflammation--can be used in cooking or taken as a supplement.  Benefits of turmeric:  -Highly anti-inflammatory  -Increases antioxidants  -Improves memory, attention, brain disease  -Lowers risk of heart disease  -May help prevent cancer  -Decreases pain  -Alleviates depression  -Delays aging and decreases risk of chronic disease  -Consume with black pepper to increase absorption    Turmeric Milk Recipe:  1 cup milk  1 tsp turmeric  1 tsp cinnamon  1 tsp grated ginger (optional)  Black pepper (boosts the anti-inflammatory properties of turmeric).  1 tsp honey   Recommend topical CBD oil- discussed its benefits in reducing inflammation, pain, insomnia, and anxiety.  -Discussed that CBD oil differs from marijuana in that it does not contain THC- the substance that causes euphoria.  -Discussed that it is made from the hemp plant.  -It has been used for thousands of years Prescribed Zynex Nexwave, cervical traction device, and heating/cooling blanket  -preliminary research suggests that if may be able to shrink cancerous tumors, stop plaque formation in Alzheimer's Disease, and slow the progress of brain disease from concussions.  -Additional benefits that have been demonstrated in studies include improved nausea, indigestion, and brain health, and reduced seizures.  -In a survey 92% of patients who tried medical cannabis felt it improved symptoms such as chronic pain, arthritis, migraines, and cancer.    -recommended certain CBD brand  -discussed procedures that could potentially help, but  could also make the pain worse.   -Provided with a pain relief journal and discussed that it contains foods and lifestyle tips to naturally help to improve pain. Discussed that these lifestyle strategies are also very good for health unlike some medications which can have negative side effects. Discussed that the act of keeping a journal can be therapeutic and helpful to realize patterns what helps to trigger and alleviate pain.    -continue B complex supplement.   -Discussed Qutenza  as an option for neuropathic pain control. Discussed that this is a capsaicin  patch, stronger than capsaicin  cream. Discussed that it is currently approved for diabetic peripheral neuropathy and post-herpetic neuralgia, but that it has also shown benefit in treating other forms of neuropathy. Provided patient with link to site to learn more about the patch: https://www.qutenza .com/. Discussed that the patch would be placed in office and benefits usually last 3 months. Discussed that unintended exposure to capsaicin  can cause severe irritation of eyes, mucous membranes, respiratory tract, and skin, but that Qutenza  is a local treatment and does not have the systemic side effects of other  nerve medications. Discussed that there may be pain, itching, erythema, and decreased sensory function associated with the application of Qutenza . Side effects usually subside within 1 week. A cold pack of analgesic medications can help with these side effects. Blood pressure can also be increased due to pain associated with administration of the patch.   2 patches of Qutenza  was applied to the area of pain. Ice packs were applied during the procedure to ensure patient comfort. Blood pressure was monitored every 15 minutes. The patient tolerated the procedure well. Post-procedure instructions were given and follow-up has been scheduled.    2, Chronic Pain Syndrome:  Continue Lyrica , d/c Baclofen  5 mg QHS. Continue to Monitor. No reports of  drowsiness. Continue to monitor.  -Discussed benefits of exercise in reducing pain. -prescribed Zynex cervical traction device and heating blanket  -Discussed current symptoms of pain and history of pain.  -Discussed benefits of exercise in reducing pain. -Discussed following foods that may reduce pain: 1) Ginger (especially studied for arthritis)- reduce leukotriene production to decrease inflammation 2) Blueberries- high in phytonutrients that decrease inflammation 3) Salmon- marine omega-3s reduce joint swelling and pain 4) Pumpkin seeds- reduce inflammation 5) dark chocolate- reduces inflammation 6) turmeric- reduces inflammation 7) tart cherries - reduce pain and stiffness 8) extra virgin olive oil - its compound olecanthal helps to block prostaglandins  9) chili peppers- can be eaten or applied topically via capsaicin  10) mint- helpful for headache, muscle aches, joint pain, and itching 11) garlic- reduces inflammation 12) Green tea- reduces inflammation and oxidative stress, helps with weight loss, may reduce the risk of cancer, recommend Double Green Matcha [place] of Tea daily  Link to further information on diet for chronic pain: http://www.bray.com/   3. Impaired balance: affected by Lyrica . Decrease Lyrica  to 100/100/75, add topamax  25mg  HS  4. Hypersensitive skin: discussed that he may feel increased pain from Qutenza  due to this.   5. Insomnia: Refilled Lyrica  -Try to go outside near sunrise -Get exercise during the day.  -Turn off all devices an hour before bedtime.  -Teas that can benefit: chamomile, valerian root, Brahmi (Bacopa) -Can consider over the counter melatonin, magnesium , and/or L-theanine. Melatonin is an anti-oxidant with multiple health benefits. Magnesium  is involved in greater than 300 enzymatic reactions in the body and most of us  are deficient as our soil is often depleted. There  are 7 different types of magnesium - Bioptemizer's is a supplement with all 7 types, and each has unique benefits. Magnesium  can also help with constipation and anxiety.  -Pistachios naturally increase the production of melatonin -Cozy Earth bamboo bed sheets are free from toxic chemicals.  -Tart cherry juice or a tart cherry supplement can improve sleep and soreness post-workout   6. Overweight BMI 25.90, weight 191 lbs -discussed that capsaicin  has been associated with weight loss -commended on weight loss! -prescribed topamax  25mg  HS  7. HTN: -BP is 152/89 -Advised checking BP daily at home and logging results to bring into follow-up appointment with PCP and myself. -Reviewed BP meds today.  -Advised regarding healthy foods that can help lower blood pressure and provided with a list: 1) citrus foods- high in vitamins and minerals 2) salmon and other fatty fish - reduces inflammation and oxylipins 3) swiss chard (leafy green)- high level of nitrates 4) pumpkin seeds- one of the best natural sources of magnesium  5) Beans and lentils- high in fiber, magnesium , and potassium 6) Berries- high in flavonoids 7) Amaranth (whole grain, can be cooked similarly to rice  and oats)- high in magnesium  and fiber 8) Pistachios- even more effective at reducing BP than other nuts 9) Carrots- high in phenolic compounds that relax blood vessels and reduce inflammation 10) Celery- contain phthalides that relax tissues of arterial walls 11) Tomatoes- can also improve cholesterol and reduce risk of heart disease 12) Broccoli- good source of magnesium , calcium, and potassium 13) Greek yogurt: high in potassium and calcium 14) Herbs and spices: Celery seed, cilantro, saffron, lemongrass, black cumin, ginseng, cinnamon, cardamom, sweet basil, and ginger 15) Chia and flax seeds- also help to lower cholesterol and blood sugar 16) Beets- high levels of nitrates that relax blood vessels  17) spinach and bananas-  high in potassium  -Provided lise of supplements that can help with hypertension:  1) magnesium : one high quality brand is Bioptemizers since it contains all 7 types of magnesium , otherwise over the counter magnesium  gluconate 400mg  is a good option 2) B vitamins 3) vitamin D  4) potassium 5) CoQ10 6) L-arginine 7) Vitamin C  8) Beetroot -Educated that goal BP is 120/80. -Made goal to incorporate some of the above foods into diet.    8) Migraines: -sumatriptan  prescribed

## 2023-09-11 ENCOUNTER — Encounter (HOSPITAL_BASED_OUTPATIENT_CLINIC_OR_DEPARTMENT_OTHER): Payer: Self-pay

## 2023-09-11 DIAGNOSIS — I10 Essential (primary) hypertension: Secondary | ICD-10-CM

## 2023-09-11 DIAGNOSIS — I5022 Chronic systolic (congestive) heart failure: Secondary | ICD-10-CM

## 2023-09-11 MED ORDER — NEBIVOLOL HCL 5 MG PO TABS: 5.0000 mg | ORAL_TABLET | Freq: Every day | ORAL | 3 refills | Status: AC

## 2023-09-11 NOTE — Telephone Encounter (Signed)
 Am I missing something? I don't see anything telling him to increase to a whole tablet.Aaron AasAaron Aas

## 2023-09-11 NOTE — Telephone Encounter (Signed)
 It is buried in a Film/video editor. He is correct, Nebivolol  5mg  one tablet per day.   Emrys Mceachron S Sayyid Harewood, NP

## 2023-10-04 IMAGING — CT CT HEART MORPH/PULM VEIN W/ CM & W/O CA SCORE
2 of 5 series · 10 of 20 positions shown, 12 images · non-contrast
Comparison: None.
COMPARISON: None.

Addendum:
EXAM:
OVER-READ INTERPRETATION  CT CHEST

The following report is an over-read performed by radiologist Dr.
Claude Cassel [REDACTED] on 07/23/2021. This
over-read does not include interpretation of cardiac or coronary
anatomy or pathology. The coronary CTA interpretation by the
cardiologist is attached.
CLINICAL DATA: Atrial fibrillation scheduled for ablation.
Cardiac CTA
TECHNIQUE: A non-contrast, gated CT scan was obtained with axial slices of 3 mm
through the heart for calcium scoring. Calcium scoring was performed
using the Agatston method. A 120 kV retrospective, gated, contrast
cardiac scan was obtained. Gantry rotation speed was 250 msecs and
collimation was 0.6 mm. Nitroglycerin was not given. A delayed scan
was obtained to exclude left atrial appendage thrombus. The 3D
dataset was reconstructed in 5% intervals of the 0-95% of the R-R
cycle. Late systolic phases were analyzed on a dedicated workstation
using MPR, MIP, and VRT modes. The patient received 80 cc of
contrast.

[Series 6: best syst · axial · 0.43mm/px · z∈[+1054,+1182]mm · 8 of 412 slices shown, 10 images]
[im 46/412  vessel]
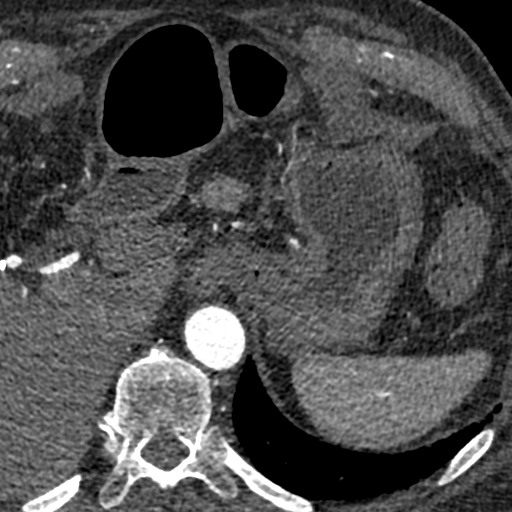
[im 46/412  lung]
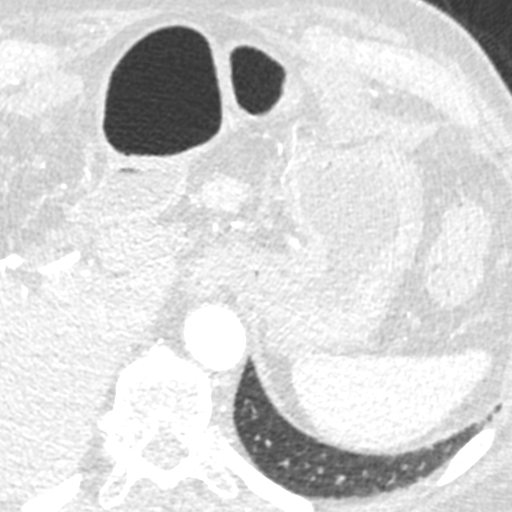
[im 92/412  vessel]
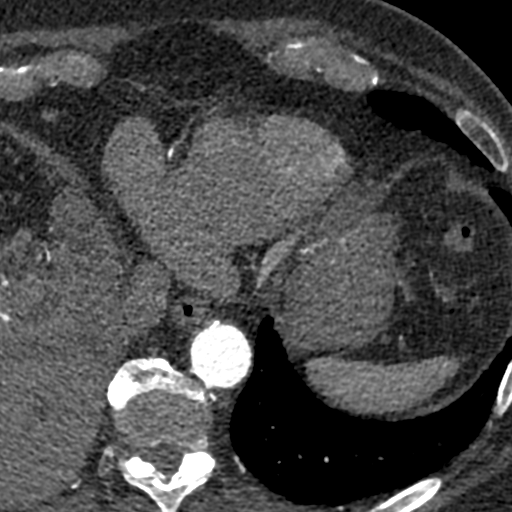
[im 138/412  vessel]
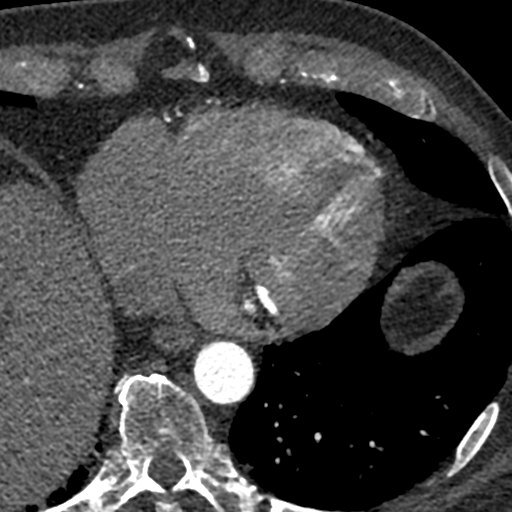
[im 183/412  vessel]
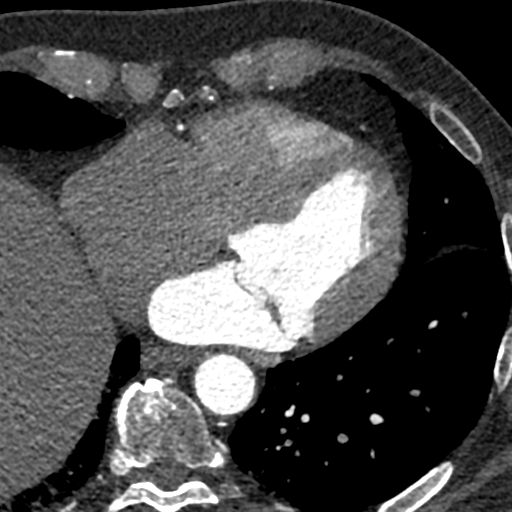
[im 229/412  vessel]
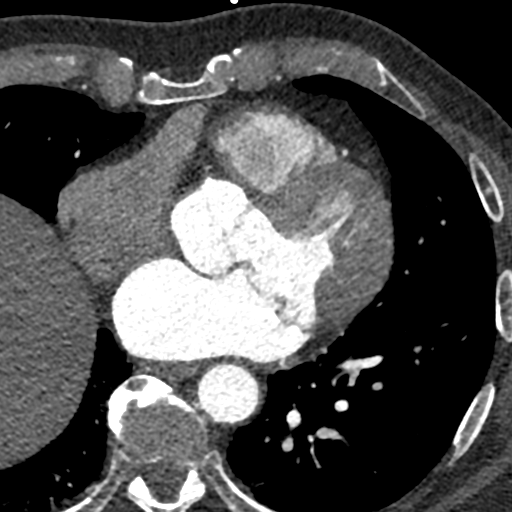
[im 229/412  lung]
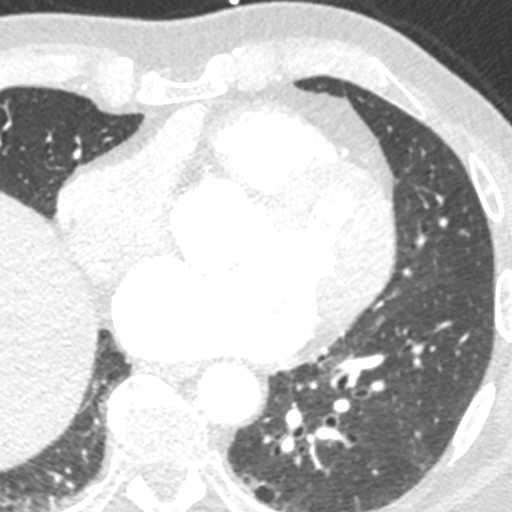
[im 275/412  vessel]
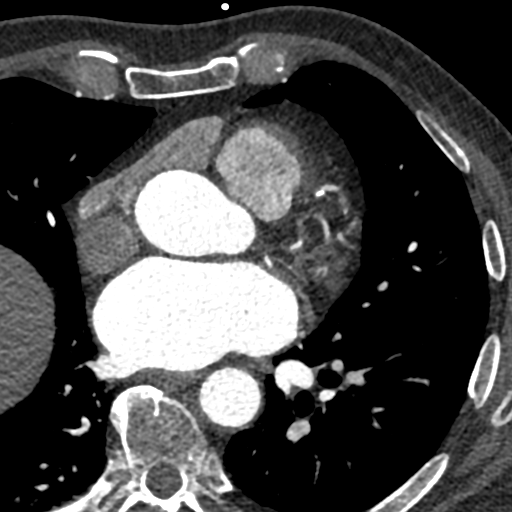
[im 320/412  vessel]
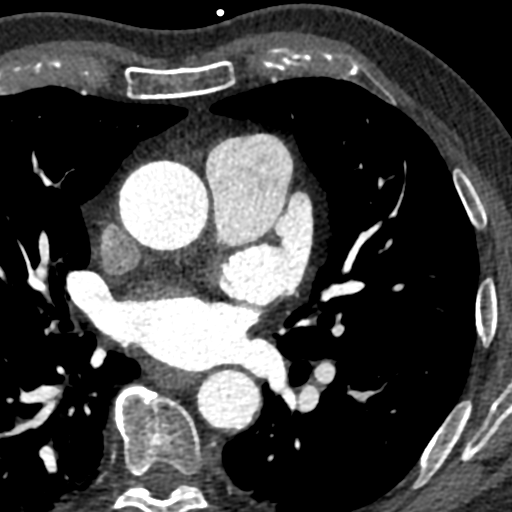
[im 366/412  vessel]
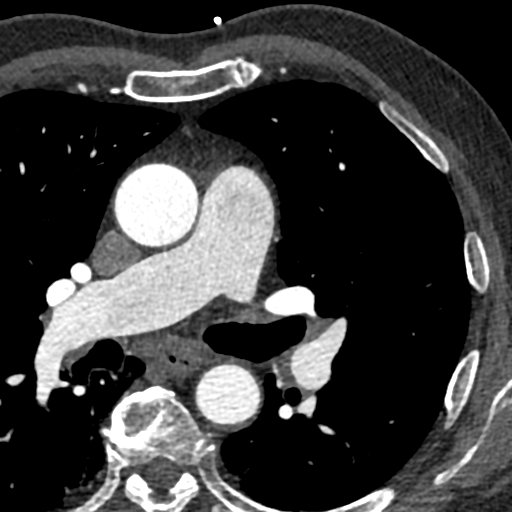

[Series 10: pv delay · axial · portal-venous · 0.43mm/px · z∈[+1144,+1172]mm · 2 of 168 slices shown]
[im 56/168  vessel]
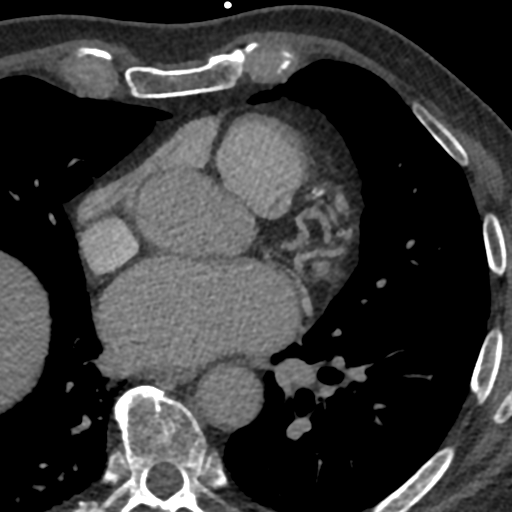
[im 112/168  vessel]
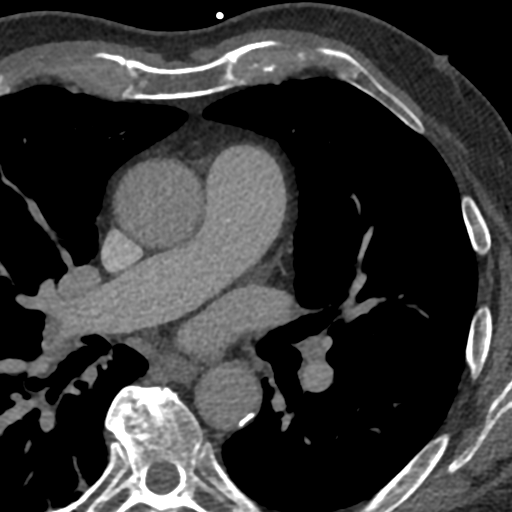

[10 of 20 positions shown; findings below may reference images not displayed]

FINDINGS: Vascular: Visualized thoracic aorta is normal in caliber and appears
patent with minimal atherosclerotic calcification. Pulmonary
arteries are normal in size without evidence of central pulmonary
embolism. Mild biatrial cardiomegaly.

Mediastinum/Nodes: Visualized trachea and esophagus are within
normal limits. No evidence of mediastinal lymphadenopathy.

Lungs/Pleura: No focal consolidations or suspicious pulmonary
nodules. Mild bronchiectasis and bronchial wall thickening no
evidence of pleural effusion or pneumothorax. Is visualized in the
central airways

Upper Abdomen: Status post cholecystectomy. The remaining visualized
upper abdomen is within normal limits.

Musculoskeletal: Flowing anterior disc osteophytes throughout the
visualized thoracic spine with preserved intervertebral disc spaces
no acute osseous abnormality or aggressive appearing osseous lesion.
IMPRESSION: 1. Mild bronchiectasis and bronchial wall thickening as could be
seen with chronic lung disease. Dedicated chest CT could be
considered for further evaluation.
2. Thoracic spine findings compatible with diffuse idiopathic
skeletal hyperostosis.
3. Mild biatrial cardiomegaly.
4.  Aortic Atherosclerosis (B85YM-P5S.S).
FINDINGS: Image quality: Excellent.

Noise artifact is: Limited.

Pulmonary Veins: There is normal pulmonary vein drainage into the
left atrium (2 on the right and 2 on the left) with ostial
measurements as follows:

RUPV: Ostium 18.3 mm x 15.8 mm  area 2.15 cm2

RLPV:  Ostium 18.3 mm x 9.27 mm   area 1.37 cm2

LUPV:  Ostium 23.4 mm x 18.1 mm  area 3.20 cm2

LLPV:  Ostium 16.3 mm x 14.9 mm  area 1.73 cm2

Left Atrium: The left atrial size is normal. There is no PFO/ASD.
The left atrial appendage is large broccoli type with two lobes.
There is no thrombus in the left atrial appendage on contrast or
delayed imaging. The esophagus runs in the left atrial midline and
is not in proximity to any of the pulmonary vein ostia.

Coronary Arteries: CAC score of 526, which is 81 percentile for
age-, race-, and sex-matched controls. Normal coronary origin. Right
dominance. The study was performed without use of NTG and is
insufficient for plaque evaluation.

Right Atrium: Right atrial size is within normal limits.

Right Ventricle: The right ventricular cavity is within normal
limits.

Left Ventricle: The ventricular cavity size is within normal limits.
There are no stigmata of prior infarction. There is no abnormal
filling defect.

Pericardium: Normal thickness with no significant effusion or
calcium present.

Pulmonary Artery: Normal caliber without proximal filling defect.

Cardiac valves: The aortic valve is trileaflet without significant
calcification. The mitral valve is normal structure without
significant calcification.

Aorta: Normal caliber with no significant disease.

Extra-cardiac findings: See attached radiology report for
non-cardiac structures.
IMPRESSION: 1. There is normal pulmonary vein drainage into the left atrium with
ostial measurements above.

2. There is no thrombus in the left atrial appendage.

3. The esophagus runs in the left atrial midline and is not in
proximity to any of the pulmonary vein ostia.

4. No PFO/ASD.

5. Normal coronary origin. Right dominance.

6. CAC score of 526 which is 81 percentile for age-, race-, and
sex-matched controls.

Khan Mohammed Rangrej, DO [REDACTED]

*** End of Addendum ***
EXAM:
OVER-READ INTERPRETATION  CT CHEST

The following report is an over-read performed by radiologist Dr.
Claude Cassel [REDACTED] on 07/23/2021. This
over-read does not include interpretation of cardiac or coronary
anatomy or pathology. The coronary CTA interpretation by the
cardiologist is attached.
FINDINGS: Vascular: Visualized thoracic aorta is normal in caliber and appears
patent with minimal atherosclerotic calcification. Pulmonary
arteries are normal in size without evidence of central pulmonary
embolism. Mild biatrial cardiomegaly.

Mediastinum/Nodes: Visualized trachea and esophagus are within
normal limits. No evidence of mediastinal lymphadenopathy.

Lungs/Pleura: No focal consolidations or suspicious pulmonary
nodules. Mild bronchiectasis and bronchial wall thickening no
evidence of pleural effusion or pneumothorax. Is visualized in the
central airways

Upper Abdomen: Status post cholecystectomy. The remaining visualized
upper abdomen is within normal limits.

Musculoskeletal: Flowing anterior disc osteophytes throughout the
visualized thoracic spine with preserved intervertebral disc spaces
no acute osseous abnormality or aggressive appearing osseous lesion.
IMPRESSION: 1. Mild bronchiectasis and bronchial wall thickening as could be
seen with chronic lung disease. Dedicated chest CT could be
considered for further evaluation.
2. Thoracic spine findings compatible with diffuse idiopathic
skeletal hyperostosis.
3. Mild biatrial cardiomegaly.
4.  Aortic Atherosclerosis (B85YM-P5S.S).

## 2023-10-12 ENCOUNTER — Ambulatory Visit (HOSPITAL_COMMUNITY)
Admission: RE | Admit: 2023-10-12 | Discharge: 2023-10-12 | Disposition: A | Discharge status: Home / Self Care | Source: Ambulatory Visit | Attending: Internal Medicine | Admitting: Internal Medicine

## 2023-10-12 VITALS — BP 170/88 | HR 54 | Ht 72.0 in | Wt 194.4 lb

## 2023-10-12 DIAGNOSIS — I4891 Unspecified atrial fibrillation: Secondary | ICD-10-CM | POA: Diagnosis not present

## 2023-10-12 DIAGNOSIS — Z5181 Encounter for therapeutic drug level monitoring: Secondary | ICD-10-CM

## 2023-10-12 DIAGNOSIS — D6869 Other thrombophilia: Secondary | ICD-10-CM

## 2023-10-12 DIAGNOSIS — Z79899 Other long term (current) drug therapy: Secondary | ICD-10-CM

## 2023-10-12 DIAGNOSIS — I4819 Other persistent atrial fibrillation: Secondary | ICD-10-CM | POA: Diagnosis not present

## 2023-10-12 NOTE — Addendum Note (Signed)
 Encounter addended by: Nathanel Bal, PA-C on: 10/12/2023 10:30 AM  Actions taken: Clinical Note Signed

## 2023-10-12 NOTE — Progress Notes (Addendum)
 Primary Care Physician: Mose Arena, MD Primary Cardiologist: Maudine Sos, MD Electrophysiologist: Boyce Byes, MD  Referring Physician: [facility]   Evan Robinson is a 78 y.o. male with a history of HTN, OSA, atrial flutter, atrial fibrillation who presents for follow up in the [facility]. The patient was initially maintained on flecainide  and underwent afib ablation 07/31/22. He had recurrence of his afib and his flecainide  was resumed and he underwent repeat afib ablation and CTI ablation on 10/25/22. Patient is on Eliquis  for a CHADS2VASC score of 4.  Patient returns for follow up for atrial fibrillation and dofetilide  admission. He reports that he has been in persistent afib since November. He has symptoms of fatigue and palpitations. He denies any missed doses of anticoagulation in the past 3 weeks.   On follow up 06/13/23, he is currently in NSR. He is here for 1 week Tikosyn  surveillance. He is s/p Tikosyn  admission 1/27-31/25. He converted chemically to normal rhythm. He required dose reduction to 250 mcg BID due to prolonged QT interval. He was discharged on magnesium  supplementation. He has had no episodes of Afib since hospital discharge. No missed doses of Tikosyn  or Eliquis .   On follow up 07/11/23, he is here for 1 month Tikosyn  surveillance. He is currently in Afib. He states it began possibly yesterday; unclear if going in and out of rhythm. No missed doses of Tikosyn  or Eliquis .  On follow up 10/12/23, patient is here for 3 month Tikosyn  surveillance. He is currently in NSR. Discussion regarding cardiac monitor results with Dr. Marven Slimmer and recommended if patient interested in repeat ablation can follow up with EP. He is pretty active and walks 3-4 miles daily at gym. He notes recently his Apple watch has shown consistent HR variability below 100 bpm and less than 5% Afib burden.  Today, he denies symptoms of chest pain, orthopnea, PND,  lower extremity edema, dizziness, presyncope, syncope, snoring, daytime somnolence, bleeding, or neurologic sequela. The patient is tolerating medications without difficulties and is otherwise without complaint today.    Atrial Fibrillation Risk Factors:  he does have symptoms or diagnosis of sleep apnea. he does not have a history of rheumatic fever.   Atrial Fibrillation Management history:  Previous antiarrhythmic drugs: flecainide , tikosyn  Previous cardioversions: none Previous ablations: 07/30/21 Anticoagulation history: Eliquis   ROS- All systems are reviewed and negative except as per the HPI above.   Physical Exam: BP (!) 170/88   Pulse (!) 54   Ht 6' (1.829 m)   Wt 88.2 kg   BMI 26.37 kg/m   GEN- The patient is well appearing, alert and oriented x 3 today.   Neck - no JVD or carotid bruit noted Lungs- Clear to ausculation bilaterally, normal work of breathing Heart- Regular rate and rhythm, no murmurs, rubs or gallops, PMI not laterally displaced Extremities- no clubbing, cyanosis, or edema Skin - no rash or ecchymosis noted   Wt Readings from Last 3 Encounters:  10/12/23 88.2 kg  08/22/23 87.5 kg  07/11/23 88.4 kg     EKG today demonstrates  Vent. rate 54 BPM PR interval 160 ms QRS duration 106 ms QT/QTcB 462/438 ms P-R-T axes 54 -34 0 Sinus bradycardia Left axis deviation Low voltage QRS Inferior infarct , age undetermined Abnormal ECG When compared with ECG of 11-Jul-2023 10:10, Sinus rhythm has replaced Atrial fibrillation   Echo 12/16/22 demonstrated   1. LVEF is unchanged compared to images from study donw in 2023 .  Left  ventricular ejection fraction, by estimation, is 45 to 50%. The left  ventricle has mildly decreased function. The left ventricular internal  cavity size was severely dilated. Left  ventricular diastolic parameters were normal.   2. Right ventricular systolic function is mildly reduced. The right  ventricular size is mildly  enlarged.   3. Mild mitral valve regurgitation. 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   4. The aortic valve is tricuspid. Aortic valve regurgitation is not  visualized. Aortic valve sclerosis/calcification is present, without any  evidence of aortic stenosis.   Cardiac monitor 07/2023: HR 40 - 134, average 60 bpm. 16% AF burden, average rate 83 bpm. Longest AF episode lasting 27 hours. Rare supraventricular and ventricular ectopy.   Donelda Fujita T. Marven Slimmer, MD, [facility], [facility] Cardiac Electrophysiology     CHA2DS2-VASc Score = 5  The patient's score is based upon: CHF History: 1 (HFmrEF) HTN History: 1 Diabetes History: 0 Stroke History: 0 Vascular Disease History: 1 Age Score: 2 Gender Score: 0      ASSESSMENT AND PLAN: Persistent Atrial Fibrillation (ICD10:  I48.19) The patient's CHA2DS2-VASc score is 5, indicating a 7.2% annual risk of stroke.   S/p afib ablation 07/30/21 and afib and flutter ablation 10/25/22. S/p dofetilide  admission 1/27-31/25.  He is currently in NSR. Cardiac monitor showed 16% Afib burden on Tikosyn . After discussion with Dr. Marven Slimmer, amenable to ablation if patient desired procedural approach for rhythm control. We discussed his recent reassuring data on Apple watch. After discussion, we will continue with current management and observation. Will reassess at next visit or prior to if he notes increased burden via Apple watch.    High risk medication monitoring (ICD10: [id number]) Patient requires ongoing monitoring for anti-arrhythmic medication which has the potential to cause life threatening arrhythmias or AV block. Qtc stable. Continue Tikosyn  250 mcg BID. Bmet and mag drawn today.  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  Continue Apixaban  (Eliquis ).  Continue Eliquis  5 mg BID without interruption.  OSA  He is  compliant with CPAP.   HTN Elevated today but patient notes at home is ~130 systolic. Continue to monitor BP.   CAD CAC score 698 No anginal symptoms reported.  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   Follow up in 3 months for Tikosyn  surveillance.    Woody Heading, PA-C [facility] [address] [place], [place] [address] [phone]

## 2023-10-13 ENCOUNTER — Encounter: Payer: Self-pay | Admitting: Cardiology

## 2023-10-13 ENCOUNTER — Ambulatory Visit (HOSPITAL_COMMUNITY): Payer: Self-pay | Admitting: Internal Medicine

## 2023-10-13 LAB — BASIC METABOLIC PANEL WITH GFR
BUN/Creatinine Ratio: 16 (ref 10–24)
BUN: 15 mg/dL (ref 8–27)
CO2: 23 mmol/L (ref 20–29)
Calcium: 9.9 mg/dL (ref 8.6–10.2)
Chloride: 96 mmol/L (ref 96–106)
Creatinine, Ser: 0.91 mg/dL (ref 0.76–1.27)
Glucose: 77 mg/dL (ref 70–99)
Potassium: 4.9 mmol/L (ref 3.5–5.2)
Sodium: 135 mmol/L (ref 134–144)
eGFR: 86 mL/min/{1.73_m2} (ref >59)

## 2023-10-13 LAB — MAGNESIUM: Magnesium: 1.8 mg/dL (ref 1.6–2.3)

## 2023-10-30 ENCOUNTER — Other Ambulatory Visit: Payer: Self-pay | Admitting: Cardiology

## 2023-10-30 DIAGNOSIS — I4819 Other persistent atrial fibrillation: Secondary | ICD-10-CM

## 2023-10-30 NOTE — Telephone Encounter (Signed)
 Prescription refill request for Eliquis  received. Indication:afib Last office visit:6/25 Scr:0.91  6/25 Age: 79 Weight:88.2  kg  Prescription refilled

## 2023-11-21 ENCOUNTER — Encounter: Attending: Physical Medicine and Rehabilitation | Admitting: Physical Medicine and Rehabilitation

## 2023-11-21 VITALS — BP 130/86 | HR 57 | Ht 72.0 in | Wt 191.0 lb

## 2023-11-21 DIAGNOSIS — I10 Essential (primary) hypertension: Secondary | ICD-10-CM | POA: Diagnosis not present

## 2023-11-21 DIAGNOSIS — Z7409 Other reduced mobility: Secondary | ICD-10-CM | POA: Diagnosis present

## 2023-11-21 DIAGNOSIS — M792 Neuralgia and neuritis, unspecified: Secondary | ICD-10-CM | POA: Insufficient documentation

## 2023-11-21 MED ORDER — CAPSAICIN-CLEANSING GEL 8 % EX KIT
2.0000 | PACK | Freq: Once | CUTANEOUS | Status: AC
  Administered 2023-11-21: 2 via TOPICAL

## 2023-11-21 NOTE — Progress Notes (Signed)
 Subjective:    Patient ID: Evan Robinson, male    DOB: 1945-09-16, 78 y.o.   MRN: [id number]   HPI:  1) Neuropathy:  Evan Robinson is a 78 y.o. male who presents for f/u of facial pain, shoulder pain, and capelike distribution of upper back and neck pain.  -continues to have benefit from Qutenza  and turmeric -has a vacation planned with his wife to [place] -has had less burning when we apply ice packs -he would like to reduce Lyrica  in the future but is nervous too at this time, because when we tried this in the past he had a resurgence in pain -he tries to ignore the pain -he did not receive additional $250 bill last visit -ready to try again today -does not need medication refills -has been able to enjoy vacations with his wife.  -we previously weaned lyrica  to 75mg  -He reports he has facial pain and neck pain radiating into his bilateral shoulders  -his pain is much better controlled since we have started applying the Qutenza  patches -he has felt that these and the turmeric have been most helpful for his pain -he does feel burning and tingling during the first couple of days after application but this dissapates shortly afterward -he requests ice packs to be applied immediately after application -he would like to repeat Qutenza  today -he would like to try ice packs with application -he does not need refill of lyrica  today -he had a good trip with his wife to [place] recently -he plans to travel with her more, moving and staying busy helps his pain He current has no sunburns -he has plans to travel this summer with his wife -he maintains on his prior medications and does not need any further refills today He has burning in left sided facial pain and shoulder It is worst with friction and exacerbated with lying down.  He has been on the Lyrica  for years. He is currently 100mg  BID and he is able to tolerate this dose well. He had dizziness with higher doses He has not had  cognitive side effects He did well with the memory recall.  He has never tried CBD oil.  Pain worsens with shaving He tried aloe vera without great benefit He is excited to try Qutenza  today- it gave him about 1 month of relief last visit.  He is not sure if he has tried carbamazepine  Capsaicin  made his skin burn and raw in the past.  -He is interested in trying medical marijuana if it gets approved.  -He asks for a refill of Lyrica  -He is ready for Qutenza  today -Had a good Christmas and New Year's Family  -he does have some dizziness -had too many side effects from gabapentin   2) Impaired balance:  -would like to try topamax  instead of Lyrica  as decreasing the Lyrica  improved his balance -he would eventually like to wean off the Lyrica  further  3) HTN -BP is 130/86    Pain Inventory Average Pain 8 Pain Right Now 7 My pain is burning  In the last 24 hours, has pain interfered with the following? General activity 7 Relation with others 8 Enjoyment of life 7 What TIME of day is your pain at its worst? evening and night Sleep (in general) Fair  Pain is worse with: sitting Pain improves with: medication Relief from Meds: 4  Family History  Problem Relation Age of Onset   Alcohol  abuse Father    Cancer Father  prostate   CAD Father        possible MI   Alcohol  abuse Paternal Grandfather    Stroke Mother    Diabetes Neg Hx    Social History   Socioeconomic History   Marital status: Married    Spouse name: Not on file   Number of children: Not on file   Years of education: Not on file   Highest education level: Not on file  Occupational History   Not on file  Tobacco Use   Smoking status: Never   Smokeless tobacco: Never   Tobacco comments:    Never smoked 05/29/23  Vaping Use   Vaping status: Never Used  Substance and Sexual Activity   Alcohol  use: No   Drug use: No   Sexual activity: Not on file  Other Topics Concern   Not on file  Social  History Narrative   Evan Robinson   Lives with wife   Occupation: retired, was Dietitian   Activity: walks daily, some gym   Diet:    Social Drivers of Corporate investment banker Strain: Not on file  Food Insecurity: No Food Insecurity (05/29/2023)   Hunger Vital Sign    Worried About Running Out of Food in the Last Year: Never true    Ran Out of Food in the Last Year: Never true  Transportation Needs: No Transportation Needs (05/29/2023)   PRAPARE - Administrator, Civil Service (Medical): No    Lack of Transportation (Non-Medical): No  Physical Activity: Not on file  Stress: Not on file  Social Connections: Socially Integrated (05/29/2023)   Social Connection and Isolation Panel    Frequency of Communication with Friends and Family: More than three times a week    Frequency of Social Gatherings with Friends and Family: More than three times a week    Attends Religious Services: More than 4 times per year    Active Member of Clubs or Organizations: Yes    Attends Banker Meetings: More than 4 times per year    Marital Status: Married   Past Surgical History:  Procedure Laterality Date   ATRIAL FIBRILLATION ABLATION N/A 07/30/2021   Procedure: ATRIAL FIBRILLATION ABLATION;  Surgeon: Cindie Ole DASEN, MD;  Location: [facility];  Service: Cardiovascular;  Laterality: N/A;   ATRIAL FIBRILLATION ABLATION N/A 10/25/2022   Procedure: ATRIAL FIBRILLATION ABLATION;  Surgeon: Cindie Ole DASEN, MD;  Location: [facility];  Service: Cardiovascular;  Laterality: N/A;   CHOLECYSTECTOMY  2006   COLONOSCOPY WITH PROPOFOL  N/A 02/01/2017   Procedure: COLONOSCOPY WITH PROPOFOL ;  Surgeon: Toledo, Ladell POUR, MD;  Location: [facility];  Service: Gastroenterology;  Laterality: N/A;   ESOPHAGOGASTRODUODENOSCOPY (EGD) WITH PROPOFOL  N/A 02/01/2017   Procedure: ESOPHAGOGASTRODUODENOSCOPY (EGD) WITH PROPOFOL ;  Surgeon: Toledo, Ladell POUR, MD;  Location: [facility]  ENDOSCOPY;  Service: Gastroenterology;  Laterality: N/A;   HERNIA REPAIR  08/2010   PROSTATECTOMY  2003   Past Surgical History:  Procedure Laterality Date   ATRIAL FIBRILLATION ABLATION N/A 07/30/2021   Procedure: ATRIAL FIBRILLATION ABLATION;  Surgeon: Cindie Ole DASEN, MD;  Location: [facility];  Service: Cardiovascular;  Laterality: N/A;   ATRIAL FIBRILLATION ABLATION N/A 10/25/2022   Procedure: ATRIAL FIBRILLATION ABLATION;  Surgeon: Cindie Ole DASEN, MD;  Location: [facility];  Service: Cardiovascular;  Laterality: N/A;   CHOLECYSTECTOMY  2006   COLONOSCOPY WITH PROPOFOL  N/A 02/01/2017   Procedure: COLONOSCOPY WITH PROPOFOL ;  Surgeon: Toledo, Ladell POUR, MD;  Location: [facility];  Service: Gastroenterology;  Laterality: N/A;   ESOPHAGOGASTRODUODENOSCOPY (EGD) WITH PROPOFOL  N/A 02/01/2017   Procedure: ESOPHAGOGASTRODUODENOSCOPY (EGD) WITH PROPOFOL ;  Surgeon: Toledo, Ladell POUR, MD;  Location: [facility];  Service: Gastroenterology;  Laterality: N/A;   HERNIA REPAIR  08/2010   PROSTATECTOMY  2003   Past Medical History:  Diagnosis Date   Dyslipidemia    H/O prostate cancer 05/02/2001   s/p radical prostatectomy (uro Dr. Althia in [place] yearly)   Headache 05/02/2010   thorough w/u - unrevealing.  has seen 3 neurologists, MRI, LP nonacute.  ESR = 3   HTN (hypertension)    Hypogonadism male    Low testosterone     Migraines    OSA on CPAP    Persistent atrial fibrillation (HCC) 06/28/2022   Renal cyst 12/01/2010   complex cyst upper pole L kidney with no malignancy by MRI   Vertigo    Vitamin D  deficiency    BP 130/86   Pulse (!) 57   Ht 6' (1.829 m)   Wt 191 lb (86.6 kg)   SpO2 98%   BMI 25.90 kg/m   Opioid Risk Score:   Fall Risk Score:  `1  Depression screen [facility] 2/9     08/22/2023    9:30 AM 05/19/2023    9:45 AM 02/16/2023    9:32 AM 08/15/2022    9:52 AM 02/07/2022   10:16 AM 11/15/2021    1:51 PM 08/26/2021    9:30 AM  Depression  screen PHQ 2/9  Decreased Interest 0 0 0 0 0 0 0  Down, Depressed, Hopeless 0 0 0 0 0 0 0  PHQ - 2 Score 0 0 0 0 0 0 0      Review of Systems  Constitutional: Negative.   HENT: Negative.    Eyes: Negative.   Respiratory: Negative.    Cardiovascular: Negative.   Gastrointestinal: Negative.   Endocrine: Negative.   Musculoskeletal:  Positive for neck pain.       Nerve pain , pain in shoulders ,   Skin: Negative.   Allergic/Immunologic: Negative.   Neurological: Negative.   Hematological: Negative.   Psychiatric/Behavioral: Negative.         Objective:   Physical Exam Vitals and nursing note reviewed. BMI 26.18, BP 144/76, weight 193 lbs Constitutional:      Appearance: Normal appearance.  Cardiovascular:     Rate and Rhythm: Normal rate and regular rhythm.     Pulses: Normal pulses.     Heart sounds: Normal heart sounds.  Pulmonary:     Effort: Pulmonary effort is normal.     Breath sounds: Normal breath sounds.  Musculoskeletal:     Cervical back: Normal range of motion and neck supple.     Comments: Normal Muscle Bulk and Muscle Testing Reveals:  Upper Extremities: Full ROM and Muscle Strength  5/5 Lower Extremities: Full ROM and Muscle Strength 5/5 Arises from chair with ease Narrow Based  Gait  Tight cervical myofascia Forward protracted posture, stable 7/22    General: Skin is warm and dry. No open lesions over back or shoulders. No sunburn, erythema after treatment Neurological:     Mental Status: He is alert and oriented to person, place, and time.  Psychiatric:        Mood and Affect: Mood normal.        Behavior: Behavior normal.         Assessment & Plan:  1. 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             Lidoderm  patch/ointment, Zyrtec, Tomapax, Almond milk, Elavil , Ketamine infusion (cound not tolerate), hypotherapy, compound medication, hypnosis without benefit             Unable to trial carbamazepine  as patient is on Eliquis              Steroids made symptoms more severe             Side effects with Cymbalta, oxcarbazepine , Pamelor              CompletedPhysical Therapy two days a week. Continue HEP            Continue Lyrica  to 100/100/75, add topamax  25mg  in the evening Pain. Continue HEP as Tolerated and Continue to Monitor.   Turmeric to reduce inflammation--can be used in cooking or taken as a supplement.  Benefits of turmeric:  -Highly anti-inflammatory  -Increases antioxidants  -Improves memory, attention, brain disease  -Lowers risk of heart disease  -May help prevent cancer  -Decreases pain  -Alleviates depression  -Delays aging and decreases risk of chronic disease  -Consume with black pepper to increase absorption    Turmeric Milk Recipe:  1 cup milk  1 tsp turmeric  1 tsp cinnamon  1 tsp grated ginger (optional)  Black pepper (boosts the anti-inflammatory properties of turmeric).  1 tsp honey   Recommend topical CBD oil- discussed its benefits in reducing inflammation, pain, insomnia, and anxiety.  -Discussed that CBD oil differs from marijuana in that it does not contain THC- the substance that causes euphoria.  -Discussed that it is made from the hemp plant.  -It has been used for thousands of years Prescribed Zynex Nexwave, cervical traction device, and heating/cooling blanket  -preliminary research suggests that if may be able to shrink cancerous tumors, stop plaque formation in Alzheimer's Disease, and slow the progress of brain disease from concussions.  -Additional benefits that have been demonstrated in studies include improved nausea, indigestion, and brain health, and reduced seizures.   -In a survey 92% of patients who tried medical cannabis felt it improved symptoms such as chronic pain, arthritis, migraines, and cancer.    -recommended certain CBD brand  -discussed procedures that could potentially help, but could also make the pain worse.   -Provided with a pain relief journal and discussed that it contains foods and lifestyle tips to naturally help to improve pain. Discussed that these lifestyle strategies are also very good for health unlike some medications which can have negative side effects. Discussed that the act of keeping a journal can be therapeutic and helpful to realize patterns what helps to trigger and alleviate pain.    -continue B complex supplement.   -Discussed Qutenza  as an option for neuropathic pain control. Discussed that this is a capsaicin  patch, stronger than capsaicin  cream. Discussed that it is currently approved for diabetic peripheral neuropathy and post-herpetic neuralgia, but that it has also shown benefit in treating other forms of neuropathy. Provided patient with link to site to learn more about the patch: https://www.qutenza .com/. Discussed that the patch  would be placed in office and benefits usually last 3 months. Discussed that unintended exposure to capsaicin  can cause severe irritation of eyes, mucous membranes, respiratory tract, and skin, but that Qutenza  is a local treatment and does not have the systemic side effects of other nerve medications. Discussed that there may be pain, itching, erythema, and decreased sensory function associated with the application of Qutenza . Side effects usually subside within 1 week. A cold pack of analgesic medications can help with these side effects. Blood pressure can also be increased due to pain associated with administration of the patch.   2 patches of Qutenza  was applied to the area of pain. Ice packs were applied during the procedure to ensure patient comfort. Blood pressure was monitored every 15  minutes. The patient tolerated the procedure well. Post-procedure instructions were given and follow-up has been scheduled.    2, Chronic Pain Syndrome:  Continue Lyrica , d/c Baclofen  5 mg QHS. Continue to Monitor. No reports of drowsiness. Continue to monitor.  -Discussed benefits of exercise in reducing pain. -prescribed Zynex cervical traction device and heating blanket  -Discussed Qutenza  as an option for neuropathic pain control. Discussed that this is a capsaicin  patch, stronger than capsaicin  cream. Discussed that it is currently approved for diabetic peripheral neuropathy and post-herpetic neuralgia, but that it has also shown benefit in treating other forms of neuropathy. Provided patient with link to site to learn more about the patch: https://www.qutenza .com/. Discussed that the patch would be placed in office and benefits usually last 3 months. Discussed that unintended exposure to capsaicin  can cause severe irritation of eyes, mucous membranes, respiratory tract, and skin, but that Qutenza  is a local treatment and does not have the systemic side effects of other nerve medications. Discussed that there may be pain, itching, erythema, and decreased sensory function associated with the application of Qutenza . Side effects usually subside within 1 week. A cold pack of analgesic medications can help with these side effects. Blood pressure can also be increased due to pain associated with administration of the patch.   2 patches of Qutenza  [phone]) was applied to bilateral upper back and neck muscles. Ice packs were applied during the procedure to ensure patient comfort. Blood pressure was monitored every 15 minutes. The patient tolerated the procedure well. Post-procedure instructions were given and follow-up has been scheduled.  Topical system measures 14cm x20cm (280cm for a total 1120units) were applied which will cause deeper penetration for destruction of the peripheral nerve using a chemical  (Qutenza ) which infuses into the skin like an injection and heat technique (occlusive, compressive dressing cauing endothermic heat technique)      -Discussed current symptoms of pain and history of pain.  -Discussed benefits of exercise in reducing pain. -Discussed following foods that may reduce pain: 1) Ginger (especially studied for arthritis)- reduce leukotriene production to decrease inflammation 2) Blueberries- high in phytonutrients that decrease inflammation 3) Salmon- marine omega-3s reduce joint swelling and pain 4) Pumpkin seeds- reduce inflammation 5) dark chocolate- reduces inflammation 6) turmeric- reduces inflammation 7) tart cherries - reduce pain and stiffness 8) extra virgin olive oil - its compound olecanthal helps to block prostaglandins  9) chili peppers- can be eaten or applied topically via capsaicin  10) mint- helpful for headache, muscle aches, joint pain, and itching 11) garlic- reduces inflammation 12) Green tea- reduces inflammation and oxidative stress, helps with weight loss, may reduce the risk of cancer, recommend Double Green Matcha [place] of Tea daily  Link to further information on  diet for chronic pain: http://www.bray.com/   3. Impaired balance: affected by Lyrica . Decrease Lyrica  to 100/100/75, add topamax  25mg  HS -dicussed goal to wean medication further whenever he is ready for this  4. Hypersensitive skin: discussed that he may feel increased pain from Qutenza  due to this.   5. Insomnia: Refilled Lyrica  -Try to go outside near sunrise -Get exercise during the day.  -Turn off all devices an hour before bedtime.  -Teas that can benefit: chamomile, valerian root, Brahmi (Bacopa) -Can consider over the counter melatonin, magnesium , and/or L-theanine. Melatonin is an anti-oxidant with multiple health benefits. Magnesium  is involved in greater than 300 enzymatic reactions in  the body and most of us  are deficient as our soil is often depleted. There are 7 different types of magnesium - Bioptemizer's is a supplement with all 7 types, and each has unique benefits. Magnesium  can also help with constipation and anxiety.  -Pistachios naturally increase the production of melatonin -Cozy Earth bamboo bed sheets are free from toxic chemicals.  -Tart cherry juice or a tart cherry supplement can improve sleep and soreness post-workout   6. Overweight BMI 25.90, weight 191 lbs -discussed that capsaicin  has been associated with weight loss -commended on weight loss! -prescribed topamax  25mg  HS  7. HTN: BP reviewed and is stable today -Advised checking BP daily at home and logging results to bring into follow-up appointment with PCP and myself. -Reviewed BP meds today.  -Advised regarding healthy foods that can help lower blood pressure and provided with a list: 1) citrus foods- high in vitamins and minerals 2) salmon and other fatty fish - reduces inflammation and oxylipins 3) swiss chard (leafy green)- high level of nitrates 4) pumpkin seeds- one of the best natural sources of magnesium  5) Beans and lentils- high in fiber, magnesium , and potassium 6) Berries- high in flavonoids 7) Amaranth (whole grain, can be cooked similarly to rice and oats)- high in magnesium  and fiber 8) Pistachios- even more effective at reducing BP than other nuts 9) Carrots- high in phenolic compounds that relax blood vessels and reduce inflammation 10) Celery- contain phthalides that relax tissues of arterial walls 11) Tomatoes- can also improve cholesterol and reduce risk of heart disease 12) Broccoli- good source of magnesium , calcium, and potassium 13) Greek yogurt: high in potassium and calcium 14) Herbs and spices: Celery seed, cilantro, saffron, lemongrass, black cumin, ginseng, cinnamon, cardamom, sweet basil, and ginger 15) Chia and flax seeds- also help to lower cholesterol and blood  sugar 16) Beets- high levels of nitrates that relax blood vessels  17) spinach and bananas- high in potassium  -Provided lise of supplements that can help with hypertension:  1) magnesium : one high quality brand is Bioptemizers since it contains all 7 types of magnesium , otherwise over the counter magnesium  gluconate 400mg  is a good option 2) B vitamins 3) vitamin D  4) potassium 5) CoQ10 6) L-arginine 7) Vitamin C  8) Beetroot -Educated that goal BP is 120/80. -Made goal to incorporate some of the above foods into diet.    8) Migraines: -sumatriptan  prescribed

## 2023-12-04 ENCOUNTER — Other Ambulatory Visit: Payer: Self-pay | Admitting: Physical Medicine and Rehabilitation

## 2023-12-25 ENCOUNTER — Other Ambulatory Visit (HOSPITAL_BASED_OUTPATIENT_CLINIC_OR_DEPARTMENT_OTHER): Payer: Self-pay

## 2023-12-25 ENCOUNTER — Other Ambulatory Visit (HOSPITAL_COMMUNITY): Payer: Self-pay | Admitting: Internal Medicine

## 2023-12-25 ENCOUNTER — Other Ambulatory Visit: Payer: Self-pay

## 2023-12-25 MED ORDER — DOFETILIDE 250 MCG PO CAPS
250.0000 ug | ORAL_CAPSULE | Freq: Two times a day (BID) | ORAL | 1 refills | Status: AC
  Filled 2023-12-25: qty 180, 90d supply, fill #0
  Filled 2024-03-23: qty 180, 90d supply, fill #1

## 2024-01-11 ENCOUNTER — Ambulatory Visit (HOSPITAL_COMMUNITY): Admitting: Internal Medicine

## 2024-01-25 ENCOUNTER — Ambulatory Visit (HOSPITAL_BASED_OUTPATIENT_CLINIC_OR_DEPARTMENT_OTHER)
Admission: RE | Admit: 2024-01-25 | Discharge: 2024-01-25 | Disposition: A | Discharge status: Home / Self Care | Source: Ambulatory Visit | Attending: Internal Medicine | Admitting: Internal Medicine

## 2024-01-25 ENCOUNTER — Ambulatory Visit (HOSPITAL_COMMUNITY): Payer: Self-pay | Admitting: Internal Medicine

## 2024-01-25 ENCOUNTER — Encounter (HOSPITAL_COMMUNITY): Payer: Self-pay | Admitting: Internal Medicine

## 2024-01-25 ENCOUNTER — Ambulatory Visit (HOSPITAL_COMMUNITY)
Admission: RE | Admit: 2024-01-25 | Discharge: 2024-01-25 | Disposition: A | Discharge status: Home / Self Care | Source: Ambulatory Visit | Attending: Internal Medicine | Admitting: Internal Medicine

## 2024-01-25 VITALS — BP 150/90 | HR 53 | Ht 72.0 in | Wt 198.6 lb

## 2024-01-25 DIAGNOSIS — I4819 Other persistent atrial fibrillation: Secondary | ICD-10-CM | POA: Insufficient documentation

## 2024-01-25 DIAGNOSIS — Z79899 Other long term (current) drug therapy: Secondary | ICD-10-CM | POA: Insufficient documentation

## 2024-01-25 DIAGNOSIS — I080 Rheumatic disorders of both mitral and aortic valves: Secondary | ICD-10-CM | POA: Insufficient documentation

## 2024-01-25 DIAGNOSIS — Z5181 Encounter for therapeutic drug level monitoring: Secondary | ICD-10-CM

## 2024-01-25 DIAGNOSIS — R600 Localized edema: Secondary | ICD-10-CM

## 2024-01-25 DIAGNOSIS — Z7901 Long term (current) use of anticoagulants: Secondary | ICD-10-CM | POA: Insufficient documentation

## 2024-01-25 DIAGNOSIS — I110 Hypertensive heart disease with heart failure: Secondary | ICD-10-CM | POA: Insufficient documentation

## 2024-01-25 DIAGNOSIS — M79605 Pain in left leg: Secondary | ICD-10-CM | POA: Insufficient documentation

## 2024-01-25 DIAGNOSIS — G4733 Obstructive sleep apnea (adult) (pediatric): Secondary | ICD-10-CM | POA: Diagnosis not present

## 2024-01-25 DIAGNOSIS — I5020 Unspecified systolic (congestive) heart failure: Secondary | ICD-10-CM | POA: Insufficient documentation

## 2024-01-25 DIAGNOSIS — R9431 Abnormal electrocardiogram [ECG] [EKG]: Secondary | ICD-10-CM | POA: Insufficient documentation

## 2024-01-25 DIAGNOSIS — M79604 Pain in right leg: Secondary | ICD-10-CM | POA: Insufficient documentation

## 2024-01-25 DIAGNOSIS — R0600 Dyspnea, unspecified: Secondary | ICD-10-CM | POA: Insufficient documentation

## 2024-01-25 DIAGNOSIS — D6869 Other thrombophilia: Secondary | ICD-10-CM | POA: Diagnosis not present

## 2024-01-25 DIAGNOSIS — I2510 Atherosclerotic heart disease of native coronary artery without angina pectoris: Secondary | ICD-10-CM | POA: Diagnosis not present

## 2024-01-25 NOTE — Patient Instructions (Signed)
 You will get a call to make appt for ultrasound of legs

## 2024-01-25 NOTE — Progress Notes (Addendum)
 Primary Care Physician: Charlene Rosina SQUIBB, MD Primary Cardiologist: Annabella Scarce, MD Electrophysiologist: OLE ONEIDA HOLTS, MD  Referring Physician: J[facility]   Field [place] is a 78 y.o. male with a history of HTN, OSA, atrial flutter, atrial fibrillation who presents for follow up in the [facility]. The patient was initially maintained on flecainide  and underwent afib ablation 07/31/22. He had recurrence of his afib and his flecainide  was resumed and he underwent repeat afib ablation and CTI ablation on 10/25/22. Patient is on Eliquis  for a CHADS2VASC score of 4.  Patient returns for follow up for atrial fibrillation and dofetilide  admission. He reports that he has been in persistent afib since November. He has symptoms of fatigue and palpitations. He denies any missed doses of anticoagulation in the past 3 weeks.   On follow up 06/13/23, he is currently in NSR. He is here for 1 week Tikosyn  surveillance. He is s/p Tikosyn  admission 1/27-31/25. He converted chemically to normal rhythm. He required dose reduction to 250 mcg BID due to prolonged QT interval. He was discharged on magnesium  supplementation. He has had no episodes of Afib since hospital discharge. No missed doses of Tikosyn  or Eliquis .   On follow up 07/11/23, he is here for 1 month Tikosyn  surveillance. He is currently in Afib. He states it began possibly yesterday; unclear if going in and out of rhythm. No missed doses of Tikosyn  or Eliquis .  On follow up 10/12/23, patient is here for 3 month Tikosyn  surveillance. He is currently in NSR. Discussion regarding cardiac monitor results with Dr. HOLTS and recommended if patient interested in repeat ablation can follow up with EP. He is pretty active and walks 3-4 miles daily at gym. He notes recently his Apple watch has shown consistent HR variability below 100 bpm and less than 5% Afib burden.  On follow up 01/25/24, patient is here for Tikosyn   surveillance. He has noted overall very low Afib burden since last office visit. No missed doses of Tikosyn  or Eliquis . He noted during recent 2 week travel bilateral leg swelling and leg pain. It has improved following his return from travel.   Today, he denies symptoms of chest pain, orthopnea, PND, dizziness, presyncope, syncope, snoring, daytime somnolence, bleeding, or neurologic sequela. The patient is tolerating medications without difficulties and is otherwise without complaint today.    Atrial Fibrillation Risk Factors:  he does have symptoms or diagnosis of sleep apnea. he does not have a history of rheumatic fever.   Atrial Fibrillation Management history:  Previous antiarrhythmic drugs: flecainide , tikosyn  Previous cardioversions: none Previous ablations: 07/30/21 Anticoagulation history: Eliquis   ROS- All systems are reviewed and negative except as per the HPI above.   Physical Exam: BP (!) 150/90   Pulse (!) 53   Ht 6' (1.829 m)   Wt 90.1 kg   BMI 26.94 kg/m   GEN- The patient is well appearing, alert and oriented x 3 today.   Neck - no JVD or carotid bruit noted Lungs- Clear to ausculation bilaterally, normal work of breathing Heart- Regular bradycardic rate and rhythm, no murmurs, rubs or gallops, PMI not laterally displaced Extremities- no clubbing, cyanosis, or edema Skin - no rash or ecchymosis noted   Wt Readings from Last 3 Encounters:  01/25/24 90.1 kg  11/21/23 86.6 kg  10/12/23 88.2 kg     EKG today demonstrates  Vent. rate 53 BPM PR interval 184 ms QRS duration 108 ms QT/QTcB 480/450 ms P-R-T axes  88 -24 -15 Sinus bradycardia with marked sinus arrhythmia Low voltage QRS Nonspecific T wave abnormality Abnormal ECG When compared with ECG of 12-Oct-2023 10:06, No significant change was found   Echo 12/16/22 demonstrated   1. LVEF is unchanged compared to images from study donw in 2023 . Left  ventricular ejection fraction, by estimation,  is 45 to 50%. The left  ventricle has mildly decreased function. The left ventricular internal  cavity size was severely dilated. Left  ventricular diastolic parameters were normal.   2. Right ventricular systolic function is mildly reduced. The right  ventricular size is mildly enlarged.   3. Mild mitral valve regurgitation. 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   4. The aortic valve is tricuspid. Aortic valve regurgitation is not  visualized. Aortic valve sclerosis/calcification is present, without any  evidence of aortic stenosis.   Cardiac monitor 07/2023: HR 40 - 134, average 60 bpm. 16% AF burden, average rate 83 bpm. Longest AF episode lasting 27 hours. Rare supraventricular and ventricular ectopy.   Ole T. Cindie, MD, [facility], [facility] Cardiac Electrophysiology    CHA2DS2-VASc Score = 5  The patient's score is based upon: CHF History: 1 (HFmrEF) HTN History: 1 Diabetes History: 0 Stroke History: 0 Vascular Disease History: 1 Age Score: 2 Gender Score: 0      ASSESSMENT AND PLAN: Persistent Atrial Fibrillation (ICD10:  I48.19) The patient's CHA2DS2-VASc score is 5, indicating a 7.2% annual risk of stroke.   S/p afib ablation 07/30/21 and afib and flutter ablation 10/25/22. S/p dofetilide  admission 1/27-31/25.  Patient is currently in NSR.   High risk medication monitoring (ICD10: J342684) Patient requires ongoing monitoring for anti-arrhythmic medication which has the potential to cause life threatening arrhythmias or AV block. Qtc stable. Continue Tikosyn  250 mcg BID. Bmet and mag drawn today.   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  Continue Apixaban  (Eliquis ).  Continue Eliquis .   OSA  He is compliant with CPAP.   HTN Stable today.  CAD CAC score 698 No anginal symptoms.  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  Leg pain and edema Will order DVT ultrasound for bilateral LE to rule out.    Follow up in 3 months for Tikosyn  surveillance.    Dorn Heinrich, PA-C [facility] [address] [place], [place] [address] [phone]

## 2024-01-26 LAB — BASIC METABOLIC PANEL WITH GFR
BUN/Creatinine Ratio: 14 (ref 10–24)
BUN: 13 mg/dL (ref 8–27)
CO2: 24 mmol/L (ref 20–29)
Calcium: 9.1 mg/dL (ref 8.6–10.2)
Chloride: 98 mmol/L (ref 96–106)
Creatinine, Ser: 0.94 mg/dL (ref 0.76–1.27)
Glucose: 92 mg/dL (ref 70–99)
Potassium: 4.2 mmol/L (ref 3.5–5.2)
Sodium: 136 mmol/L (ref 134–144)
eGFR: 83 mL/min/1.73 (ref >59)

## 2024-01-26 LAB — MAGNESIUM: Magnesium: 1.8 mg/dL (ref 1.6–2.3)

## 2024-02-05 ENCOUNTER — Ambulatory Visit
Admission: RE | Admit: 2024-02-05 | Discharge: 2024-02-05 | Disposition: A | Discharge status: Home / Self Care | Payer: Self-pay | Source: Ambulatory Visit | Attending: Family Medicine | Admitting: Family Medicine

## 2024-02-05 ENCOUNTER — Other Ambulatory Visit: Payer: Self-pay

## 2024-02-05 VITALS — BP 132/82 | HR 70 | Temp 97.8°F | Resp 16

## 2024-02-05 DIAGNOSIS — J0100 Acute maxillary sinusitis, unspecified: Secondary | ICD-10-CM | POA: Diagnosis not present

## 2024-02-05 MED ORDER — MECLIZINE HCL 25 MG PO TABS: 25.0000 mg | ORAL_TABLET | Freq: Three times a day (TID) | ORAL | 0 refills | Status: AC | PRN

## 2024-02-05 MED ORDER — AMOXICILLIN-POT CLAVULANATE 875-125 MG PO TABS: 1.0000 | ORAL_TABLET | Freq: Two times a day (BID) | ORAL | 0 refills | Status: AC

## 2024-02-05 NOTE — Discharge Instructions (Signed)
-   Please take Augmentin twice daily for 5 days to treat sinus infection - You can take Tylenol  1000 mg every 6 hours for headache - Please drink plenty of fluid - If your symptoms worsen or fail to improve please seek medical care

## 2024-02-05 NOTE — ED Triage Notes (Signed)
 Congestion x 2 weeks, vertigo and headaches x 3 days. No ear pain. No otc meds.

## 2024-02-05 NOTE — ED Provider Notes (Signed)
 [facility]    CSN: [id number] Arrival date & time: 02/05/24  0917     History   Chief Complaint Chief Complaint  Patient presents with   Dizziness   Headache    HPI Evan Robinson is a 78 y.o. male pertinent medical history of atrial fibrillation (on Tikosyn , Eliquis ), chronic recurrent headaches (previous workup including brain MRI unremarkable), chronic recurrent vertigo (previously well-controlled, tolerates as needed meclizine  well).  Reports 2 weeks ago he went on a trip, was on a bus with lots of people.  Since then, multiple friends/family members have sinus infection symptoms.  His symptoms began 2 weeks ago after he returned from his trip, and have continued.  Tactile fevers, nasal congestion, sinus pressure, cough, positional dizziness and headaches.  No difficulty breathing, no chest pain.  No falls, did not hit head.  Using Tylenol  only for symptom control.    Dizziness Associated symptoms: headaches   Headache Associated symptoms: dizziness     Past Medical History:  Diagnosis Date   Dyslipidemia    H/O prostate cancer 05/02/2001   s/p radical prostatectomy (uro Dr. Althia in [place] yearly)   Headache 05/02/2010   thorough w/u - unrevealing.  has seen 3 neurologists, MRI, LP nonacute.  ESR = 3   HTN (hypertension)    Hypogonadism male    Low testosterone     Migraines    OSA on CPAP    Persistent atrial fibrillation (HCC) 06/28/2022   Renal cyst 12/01/2010   complex cyst upper pole L kidney with no malignancy by MRI   Vertigo    Vitamin D  deficiency     Patient Active Problem List   Diagnosis Date Noted   Encounter for monitoring dofetilide  therapy 06/13/2023   Electrolyte abnormality 12/16/2022   Syncope 12/15/2022   Hypercoagulable state due to persistent atrial fibrillation (HCC) 11/22/2022   Persistent atrial fibrillation (HCC) 06/28/2022   Chronic tension-type headache, not intractable 11/14/2019   Trigeminal neuralgia syndrome  03/05/2019   Vitamin B 12 deficiency 08/13/2018   Sleep disturbance 07/13/2018   Neuropathic pain 06/20/2018   Facial pain, atypical 04/05/2018   Vitamin D  deficiency    OSA on CPAP    HTN (hypertension)    Headache    Low testosterone     H/O prostate cancer    Dyslipidemia     Past Surgical History:  Procedure Laterality Date   ATRIAL FIBRILLATION ABLATION N/A 07/30/2021   Procedure: ATRIAL FIBRILLATION ABLATION;  Surgeon: Cindie Ole DASEN, MD;  Location: [facility];  Service: Cardiovascular;  Laterality: N/A;   ATRIAL FIBRILLATION ABLATION N/A 10/25/2022   Procedure: ATRIAL FIBRILLATION ABLATION;  Surgeon: Cindie Ole DASEN, MD;  Location: [facility];  Service: Cardiovascular;  Laterality: N/A;   CHOLECYSTECTOMY  2006   COLONOSCOPY WITH PROPOFOL  N/A 02/01/2017   Procedure: COLONOSCOPY WITH PROPOFOL ;  Surgeon: Toledo, Ladell POUR, MD;  Location: [facility] ENDOSCOPY;  Service: Gastroenterology;  Laterality: N/A;   ESOPHAGOGASTRODUODENOSCOPY (EGD) WITH PROPOFOL  N/A 02/01/2017   Procedure: ESOPHAGOGASTRODUODENOSCOPY (EGD) WITH PROPOFOL ;  Surgeon: Toledo, Ladell POUR, MD;  Location: [facility] ENDOSCOPY;  Service: Gastroenterology;  Laterality: N/A;   HERNIA REPAIR  08/2010   PROSTATECTOMY  2003       Home Medications    Prior to Admission medications   Medication Sig Start Date End Date Taking? Authorizing Provider  amoxicillin-clavulanate (AUGMENTIN) 875-125 MG tablet Take 1 tablet by mouth every 12 (twelve) hours for 5 days. 02/05/24 02/10/24 Yes Howell Lunger, DO  meclizine  (ANTIVERT )  25 MG tablet Take 1 tablet (25 mg total) by mouth 3 (three) times daily as needed for dizziness. 02/05/24  Yes Howell Lunger, DO  acetaminophen  (TYLENOL ) 500 MG tablet Take 1,000 mg by mouth daily as needed for headache or moderate pain. Patient taking differently: Take 1,000 mg by mouth as needed for headache or moderate pain (pain score 4-6).    [provider]  ALPRAZolam (XANAX)  0.5 MG tablet Take 0.5 mg by mouth as needed. 09/01/23   [provider]  Ascorbic Acid  (VITAMIN C  PO) Take 1 tablet by mouth daily.    [provider]  ASHWAGANDHA PO Take 1 tablet by mouth every morning.    [provider]  b complex vitamins capsule Take 1 capsule by mouth daily.    [provider]  baclofen  (LIORESAL ) 10 MG tablet Take 10 mg by mouth as needed. 12/09/21   [provider]  Carboxymethylcellul-Glycerin  (CLEAR EYES FOR DRY EYES OP) Place 1 drop into both eyes 3 (three) times daily as needed (dryness, irritation).    [provider]  dofetilide  (TIKOSYN ) 250 MCG capsule Take 1 capsule (250 mcg total) by mouth 2 (two) times daily. 12/25/23   Terra Fairy PARAS, PA-C  ELIQUIS  5 MG TABS tablet TAKE 1 TABLET(5 MG) BY MOUTH TWICE DAILY 10/30/23   Cindie Ole DASEN, MD  gemfibrozil  (LOPID ) 600 MG tablet Take 600 mg by mouth daily.    [provider]  losartan  (COZAAR ) 25 MG tablet TAKE 1 TABLET(25 MG) BY MOUTH EVERY DAY 05/04/23   Walker, Caitlin S, NP  magnesium  oxide (MAG-OX) 400 MG tablet Take 1 tablet (400 mg total) by mouth daily at 12 noon. 06/26/23   Terra Fairy PARAS, PA-C  nebivolol  (BYSTOLIC ) 5 MG tablet Take 1 tablet (5 mg total) by mouth daily. 09/11/23   Walker, Caitlin S, NP  pregabalin  (LYRICA ) 100 MG capsule TAKE 1 CAPSULE(100 MG) BY MOUTH THREE TIMES DAILY 08/07/23   Raulkar, Sven SQUIBB, MD  SUMAtriptan  (IMITREX ) 50 MG tablet Take 1 tablet (50 mg total) by mouth once as needed for migraine. May repeat in 2 hours if headache persists or recurs. 08/22/23 08/22/24  Lorilee Sven SQUIBB, MD  Turmeric (QC TUMERIC COMPLEX PO) Take 1 tablet by mouth every morning.    [provider]  zolpidem  (AMBIEN ) 10 MG tablet Take 10 mg by mouth at bedtime. 12/27/21   [provider]    Family History Family History  Problem Relation Age of Onset   Alcohol  abuse Father    Cancer Father        prostate   CAD Father         possible MI   Alcohol  abuse Paternal Grandfather    Stroke Mother    Diabetes Neg Hx     Social History Social History   Tobacco Use   Smoking status: Never   Smokeless tobacco: Never   Tobacco comments:    Never smoked 05/29/23  Vaping Use   Vaping status: Never Used  Substance Use Topics   Alcohol  use: No   Drug use: No     Allergies   Asa [aspirin], Nsaids, and Tricor [fenofibrate]   Review of Systems Review of Systems  Neurological:  Positive for dizziness and headaches.     Physical Exam Triage Vital Signs ED Triage Vitals  Encounter Vitals Group     BP 02/05/24 0923 132/82     Girls Systolic BP Percentile --      Girls  Diastolic BP Percentile --      Boys Systolic BP Percentile --      Boys Diastolic BP Percentile --      Pulse Rate 02/05/24 0923 70     Resp 02/05/24 0923 16     Temp 02/05/24 0923 97.8 F (36.6 C)     Temp src --      SpO2 02/05/24 0923 97 %     Weight --      Height --      Head Circumference --      Peak Flow --      Pain Score 02/05/24 0926 7     Pain Loc --      Pain Education --      Exclude from Growth Chart --    No data found.  Updated Vital Signs BP 132/82   Pulse 70   Temp 97.8 F (36.6 C)   Resp 16   SpO2 97%   Visual Acuity Right Eye Distance:   Left Eye Distance:   Bilateral Distance:    Right Eye Near:   Left Eye Near:    Bilateral Near:     Physical Exam Constitutional:      General: He is not in acute distress. HENT:     Head: Normocephalic and atraumatic.     Comments: Tenderness to maxillary sinuses    Right Ear: Tympanic membrane and ear canal normal.     Left Ear: Tympanic membrane and ear canal normal.     Mouth/Throat:     Mouth: Mucous membranes are moist.     Pharynx: Oropharynx is clear.  Eyes:     Extraocular Movements: Extraocular movements intact.     Pupils: Pupils are equal, round, and reactive to light.  Cardiovascular:     Rate and Rhythm: Regular rhythm.     Heart sounds:  Normal heart sounds.  Pulmonary:     Effort: Pulmonary effort is normal.     Breath sounds: Normal breath sounds.  Musculoskeletal:     Cervical back: Normal range of motion.  Lymphadenopathy:     Cervical: No cervical adenopathy.  Neurological:     Mental Status: He is alert and oriented to person, place, and time.     Comments: No overt focal neurologic deficits, gait is normal.      UC Treatments / Results  Labs (all labs ordered are listed, but only abnormal results are displayed) Labs Reviewed - No data to display  EKG   Radiology No results found.  Procedures Procedures (including critical care time)  Medications Ordered in UC Medications - No data to display  Initial Impression / Assessment and Plan / UC Course  I have reviewed the triage vital signs and the nursing notes.  Pertinent labs & imaging results that were available during my care of the patient were reviewed by me and considered in my medical decision making (see chart for details).   Well-appearing, afebrile with stable vitals.  Differential for congestion with headache: Sinus infection, allergic rhinitis, viral URI.  Given duration of symptoms, will treat for bacterial sinusitis.  Augmentin twice daily for 5 days-verified safety with Tikosyn . Dizziness is positional with rapid head movements, suspect flare of vertigo 2/2 sinus infection-previously controlled with meclizine .  Meclizine  refill provided.  Counseled on adequate hydration. Lower concern for insidious cause of dizziness given his history and exam above.  Follow-up and strict return precautions provided.  Final Clinical Impressions(s) / UC Diagnoses   Final diagnoses:  Acute non-recurrent maxillary sinusitis     Discharge Instructions      - Please take Augmentin twice daily for 5 days to treat sinus infection - You can take Tylenol  1000 mg every 6 hours for headache - Please drink plenty of fluid - If your symptoms worsen or fail to  improve please seek medical care     ED Prescriptions     Medication Sig Dispense Auth. Provider   amoxicillin-clavulanate (AUGMENTIN) 875-125 MG tablet Take 1 tablet by mouth every 12 (twelve) hours for 5 days. 10 tablet Wyatt Galvan, DO   meclizine  (ANTIVERT ) 25 MG tablet Take 1 tablet (25 mg total) by mouth 3 (three) times daily as needed for dizziness. 30 tablet Tyrice Hewitt, DO      I have reviewed the PDMP during this encounter.   Howell Lunger, DO 02/05/24 1004

## 2024-02-06 NOTE — Telephone Encounter (Signed)
Called to check on patient. No answer. Left voicemail.

## 2024-02-19 ENCOUNTER — Encounter (HOSPITAL_BASED_OUTPATIENT_CLINIC_OR_DEPARTMENT_OTHER): Payer: Self-pay | Admitting: Family

## 2024-02-19 ENCOUNTER — Ambulatory Visit (HOSPITAL_BASED_OUTPATIENT_CLINIC_OR_DEPARTMENT_OTHER): Admitting: Family

## 2024-02-19 VITALS — BP 132/70 | HR 53 | Ht 72.0 in | Wt 196.5 lb

## 2024-02-19 DIAGNOSIS — I4819 Other persistent atrial fibrillation: Secondary | ICD-10-CM | POA: Diagnosis not present

## 2024-02-19 DIAGNOSIS — E785 Hyperlipidemia, unspecified: Secondary | ICD-10-CM

## 2024-02-19 DIAGNOSIS — D6859 Other primary thrombophilia: Secondary | ICD-10-CM

## 2024-02-19 DIAGNOSIS — I25118 Atherosclerotic heart disease of native coronary artery with other forms of angina pectoris: Secondary | ICD-10-CM

## 2024-02-19 DIAGNOSIS — G4733 Obstructive sleep apnea (adult) (pediatric): Secondary | ICD-10-CM

## 2024-02-19 DIAGNOSIS — M79604 Pain in right leg: Secondary | ICD-10-CM

## 2024-02-19 DIAGNOSIS — I10 Essential (primary) hypertension: Secondary | ICD-10-CM

## 2024-02-19 DIAGNOSIS — M79605 Pain in left leg: Secondary | ICD-10-CM

## 2024-02-19 NOTE — Patient Instructions (Signed)
 Medication Instructions:   Your physician recommends that you continue on your current medications as directed. Please refer to the Current Medication list given to you today.  *If you need a refill on your cardiac medications before your next appointment, please call your pharmacy*   Testing/Procedures:  Your physician has requested that you have an echocardiogram. Echocardiography is a painless test that uses sound waves to create images of your heart. It provides your doctor with information about the size and shape of your heart and how well your heart's chambers and valves are working. This procedure takes approximately one hour. There are no restrictions for this procedure. Please do NOT wear cologne, perfume, aftershave, or lotions (deodorant is allowed). Please arrive 15 minutes prior to your appointment time.  Please note: We ask at that you not bring children with you during ultrasound (echo/ vascular) testing. Due to room size and safety concerns, children are not allowed in the ultrasound rooms during exams. Our front office staff cannot provide observation of children in our lobby area while testing is being conducted. An adult accompanying a patient to their appointment will only be allowed in the ultrasound room at the discretion of the ultrasound technician under special circumstances. We apologize for any inconvenience.   Your physician has requested that you have an ankle brachial index (ABI). During this test an ultrasound and blood pressure cuff are used to evaluate the arteries that supply the arms and legs with blood. Allow thirty minutes for this exam. There are no restrictions or special instructions.  Please note: We ask at that you not bring children with you during ultrasound (echo/ vascular) testing. Due to room size and safety concerns, children are not allowed in the ultrasound rooms during exams. Our front office staff cannot provide observation of children in our  lobby area while testing is being conducted. An adult accompanying a patient to their appointment will only be allowed in the ultrasound room at the discretion of the ultrasound technician under special circumstances. We apologize for any inconvenience.   Follow-Up: At [facility], you and your health needs are our priority.  As part of our continuing mission to provide you with exceptional heart care, our providers are all part of one team.  This team includes your primary Cardiologist (physician) and Advanced Practice Providers or APPs (Physician Assistants and Nurse Practitioners) who all work together to provide you with the care you need, when you need it.  Your next appointment:   6 month(s)  Provider:   Annabella Scarce, MD, Rosaline Bane, NP, or [name], NP

## 2024-02-19 NOTE — Progress Notes (Signed)
 Cardiology Office Note:  .   Date:  02/19/2024  ID:  Tyshan Swaziland, DOB [birth year], MRN [id number] PCP: Charlene Rosina SQUIBB, MD  [facility] Providers Cardiologist:  Annabella Scarce, MD Electrophysiologist:  OLE ONEIDA HOLTS, MD    History of Present Illness: .   Kirkland Swaziland is a 78 y.o. male with history of hypertension, HFmrEF, atrial fibrillation/flutter s/p ablation X2, OSA, syncope, MVP, nonobstructive CAD.  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   01/26/23 Diltiazem  stopped and Metoprolol  started. Cardiac CTA with moderate CAD but no obstructive disease. Due to CAD, Flecainide  stopped by Dr. HOLTS 03/01/23. Plan to consider Tikosyn  if recurrent atrial fib. At visit 03/28/23 Metoprolol  not tolerated and transitioned to Nebivolol . Saw Dr. Lambert 05/02/23 for persistent atrial fibrillation with recommendation for Tikosyn  admission.   Tikosyn  admission 1/27-1/31/25. Dose reduced to 250mcg BID due to prolonged QT. He was last seen 01/25/24 by AFib clinic with low AFib burden. Due to leg pain, LE duplex ordered with no DVT.  Presents today for follow up with his wife. Feeling overall well since last seen. Reports no shortness of breath nor dyspnea on exertion. Reports no chest pain, pressure, or tightness. No edema, orthopnea, PND. Reports no palpitations. Reports bouts of vertigo, most recently assoiated with a long ferry ride. Does note bilateral leg pain with ambulation on his trip which has persisted with his walking regimen since returning home. Uncertain if related not walking as much during COVID after his trip. Legs feel tired after walking from knees down.   ROS: Please see the history of present illness.    All other systems reviewed and are negative.   Studies Reviewed: .        Cardiac Studies & Procedures    ______________________________________________________________________________________________     ECHOCARDIOGRAM  ECHOCARDIOGRAM COMPLETE 12/16/2022  Narrative ECHOCARDIOGRAM REPORT    Patient Name:   Chet SWAZILAND Date of Exam: 12/16/2022 Medical Rec #:  [id number]     Height:       72.0 in Accession #:    [id number]    Weight:       190.0 lb Date of Birth:  [birth year]     BSA:          2.085 m Patient Age:    76 years      BP:           147/73 mmHg Patient Gender: M             HR:           63 bpm. Exam Location:  Inpatient  Procedure: 2D Echo, Cardiac Doppler and Color Doppler  Indications:    Syncope R55  History:        Patient has prior history of Echocardiogram examinations, most recent 10/25/2022.  Sonographer:    Tinnie Gosling RDCS Referring Phys: [phone] WHITNEY PLUNKETT  IMPRESSIONS   1. LVEF is unchanged compared to images from study donw in 2023 . Left ventricular ejection fraction, by estimation, is 45 to 50%. The left ventricle has mildly decreased function. The left ventricular internal cavity size was severely dilated. Left ventricular diastolic parameters were normal. 2. Right ventricular systolic function is mildly reduced. The right ventricular size is mildly enlarged. 3. Mild mitral valve regurgitation. 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 4. The aortic valve is tricuspid. Aortic valve regurgitation is not  visualized. Aortic valve sclerosis/calcification is present, without any evidence of aortic stenosis.  FINDINGS Left Ventricle: LVEF is unchanged compared to images from study donw in 2023. Left ventricular ejection fraction, by estimation, is 45 to 50%. The left ventricle has mildly decreased function. The left ventricular internal cavity size was severely dilated. There is no left ventricular hypertrophy. Left ventricular diastolic parameters were normal.  Right Ventricle: The right ventricular size is mildly enlarged.  Right vetricular wall thickness was not assessed. Right ventricular systolic function is mildly reduced.  Left Atrium: Left atrial size was normal in size.  Right Atrium: Right atrial size was normal in size.  Pericardium: There is no evidence of pericardial effusion.  Mitral Valve: There is moderate prolapse of both leaflets of the mitral valve. There is mild thickening of the mitral valve leaflet(s). Mild mitral valve regurgitation.  Tricuspid Valve: The tricuspid valve is normal in structure. Tricuspid valve regurgitation is mild.  Aortic Valve: The aortic valve is tricuspid. Aortic valve regurgitation is not visualized. Aortic valve sclerosis/calcification is present, without any evidence of aortic stenosis.  Pulmonic Valve: The pulmonic valve was normal in structure. Pulmonic valve regurgitation is trivial.  Aorta: The aortic root and ascending aorta are structurally normal, with no evidence of dilitation.  IAS/Shunts: No atrial level shunt detected by color flow Doppler.   LEFT VENTRICLE PLAX 2D LVIDd:         6.40 cm   Diastology LVIDs:         4.20 cm   LV e' medial:    6.74 cm/s LV PW:         0.80 cm   LV E/e' medial:  9.4 LV IVS:        0.70 cm   LV e' lateral:   10.20 cm/s LVOT diam:     2.10 cm   LV E/e' lateral: 6.2 LV SV:         58 LV SV Index:   28 LVOT Area:     3.46 cm   RIGHT VENTRICLE TAPSE (M-mode): 3.6 cm  LEFT ATRIUM             Index        RIGHT ATRIUM           Index LA diam:        4.10 cm 1.97 cm/m   RA Area:     15.20 cm LA Vol (A2C):   74.5 ml 35.74 ml/m  RA Volume:   38.40 ml  18.42 ml/m LA Vol (A4C):   47.5 ml 22.79 ml/m LA Biplane Vol: 62.6 ml 30.03 ml/m AORTIC VALVE             PULMONIC VALVE LVOT Vmax:   80.60 cm/s  PR End Diast Vel: 5.11 msec LVOT Vmean:  51.800 cm/s LVOT VTI:    0.167 m  AORTA Ao Root diam: 4.00 cm Ao Asc diam:  3.50 cm  MITRAL VALVE               TRICUSPID VALVE MV Area (PHT): 2.69 cm    TR Peak grad:    38.7 mmHg MV Decel Time: 282 msec    TR Vmax:        311.00 cm/s MV E velocity: 63.30 cm/s MV A velocity: 48.70 cm/s  SHUNTS MV E/A ratio:  1.30        Systemic VTI:  0.17 m Systemic Diam: 2.10 cm  Vina Gull MD Electronically signed by Vina Gull MD  Signature Date/Time: 12/16/2022/12:45:22 PM    Final    MONITORS  LONG TERM MONITOR (3-14 DAYS) 07/27/2023  Narrative HR 40 - 134, average 60 bpm. 16% AF burden, average rate 83 bpm. Longest AF episode lasting 27 hours. Rare supraventricular and ventricular ectopy.  Ole T. Cindie, MD, [facility], [facility] Cardiac Electrophysiology   CT SCANS  CT CORONARY MORPH W/CTA COR W/SCORE 02/07/2023  Addendum 03/04/2023 12:46 PM ADDENDUM REPORT: 03/04/2023 12:44  EXAM: OVER-READ INTERPRETATION  CT CHEST  The following report is an over-read performed by radiologist Dr. Andrea Gasman of [facility] on 03/04/2023. This over-read does not include interpretation of cardiac or coronary anatomy or pathology. The coronary CTA interpretation by the cardiologist is attached.  COMPARISON:  Cardiac CT 10/18/2022  FINDINGS: Vascular: Aortic atherosclerosis. The included aorta is normal in caliber.  Mediastinum/nodes: No adenopathy or mass. Patulous distal esophagus.  Lungs: Elevated right hemidiaphragm with adjacent atelectasis. Bibasilar bronchiectasis. No pulmonary nodule. No pleural fluid. The included airways are patent.  Upper abdomen: No acute findings. Cholecystectomy. Subcentimeter hypodensity in the right lobe of the liver is too small to characterize but likely small cyst.  Musculoskeletal: There are no acute or suspicious osseous abnormalities. Thoracic spondylosis with anterior spurring.  IMPRESSION: 1. No acute extracardiac findings. 2. Aortic Atherosclerosis (ICD10-I70.0). 3. Elevated right hemidiaphragm with adjacent atelectasis. Chronic bibasilar bronchiectasis.   Electronically Signed By: Andrea Gasman M.D. On: 03/04/2023 12:44  Narrative CLINICAL DATA:  Chest pain  EXAM: Cardiac/Coronary CTA  TECHNIQUE: A non-contrast, gated CT scan was obtained with axial slices of 3 mm through the heart for calcium scoring. Calcium scoring was performed using the Agatston method. A 120 kV prospective, gated, contrast cardiac scan was obtained. Gantry rotation speed was 250 msecs and collimation was 0.6 mm. Two sublingual nitroglycerin  tablets (0.8 mg) were given. The 3D data set was reconstructed in 5% intervals of the 35-75% of the R-R cycle. Diastolic phases were analyzed on a dedicated workstation using MPR, MIP, and VRT modes. The patient received 95 cc of contrast.  FINDINGS: Image quality: Good.  Moderate blooming artifact.  Noise artifact is: Limited.  Coronary Arteries:  Normal coronary origin.  Right dominance.  Left main: The left main is a large caliber vessel with a normal take off from the left coronary cusp that trifurcates into a LAD, LCX, and ramus intermedius. There is mild calcified plaque in the distal LM with associated stenosis of 25-49%.  Left anterior descending artery: The LAD gives off 2 patent diagonal branches. There is diffuse moderate calcified plaque in the proximal LAD with associated stenosis of 50-69%.  Ramus intermedius: Patent and bifurcates into 2 large sub branches with no evidence of plaque or stenosis.  Left circumflex artery: The LCX is non-dominant and patent with no evidence of plaque or stenosis. The LCX gives off 2 patent obtuse marginal branches.  Right coronary artery: The RCA is dominant with normal take off from the right coronary cusp. There is no evidence of plaque or stenosis. The RCA terminates as a PDA and right posterolateral branch without evidence of plaque or stenosis.  Right Atrium: Right atrial size is within normal limits.  Right Ventricle: The right ventricular cavity is within normal limits.  Left Atrium:  Left atrial size is normal in size with no left atrial appendage filling defect.  Left Ventricle: The ventricular cavity size is within normal limits.  Pulmonary arteries: Normal in size.  Pulmonary veins: Normal pulmonary venous drainage.  Pericardium: Normal thickness  without significant effusion or calcium present.  Cardiac valves: The aortic valve is trileaflet without significant calcification. The mitral valve is normal without significant calcification.  Aorta: Normal caliber without significant disease.  Extra-cardiac findings: See attached radiology report for non-cardiac structures.  IMPRESSION: 1. Coronary calcium score of 698. This was 66th percentile for age-, sex, and race-matched controls.  2. Total plaque volume 464 mm3 which is 34th percentile for age- and sex-matched controls (calcified plaque 188mm3; non-calcified plaque 366mm3). TPV is severe.  3. Normal coronary origin with right dominance.  4. Moderate atherosclerosis.  50-69% proximal LAD.  5. Consider symptom-guided anti-ischemic and preventive pharmacotherapy as well as risk factor modification per guideline directed care.  6. This study has been submitted for FFR analysis of the LAD.  RECOMMENDATIONS: 1. CAD-RADS 0: No evidence of CAD (0%). Consider non-atherosclerotic causes of chest pain.  2. CAD-RADS 1: Minimal non-obstructive CAD (0-24%). Consider non-atherosclerotic causes of chest pain. Consider preventive therapy and risk factor modification.  3. CAD-RADS 2: Mild non-obstructive CAD (25-49%). Consider non-atherosclerotic causes of chest pain. Consider preventive therapy and risk factor modification.  4. CAD-RADS 3: Moderate stenosis. Consider symptom-guided anti-ischemic pharmacotherapy as well as risk factor modification per guideline directed care. Additional analysis with CT FFR will be submitted.  5. CAD-RADS 4: Severe stenosis. (70-99% or > 50% left main).  Cardiac catheterization or CT FFR is recommended. Consider symptom-guided anti-ischemic pharmacotherapy as well as risk factor modification per guideline directed care. Invasive coronary angiography recommended with revascularization per published guideline statements.  6. CAD-RADS 5: Total coronary occlusion (100%). Consider cardiac catheterization or viability assessment. Consider symptom-guided anti-ischemic pharmacotherapy as well as risk factor modification per guideline directed care.  7. CAD-RADS N: Non-diagnostic study. Obstructive CAD can't be excluded. Alternative evaluation is recommended.  Wilbert Bihari, MD  Electronically Signed: By: Wilbert Bihari M.D. On: 02/09/2023 22:44     ______________________________________________________________________________________________        Risk Assessment/Calculations:    CHA2DS2-VASc Score = 5   This indicates a 7.2% annual risk of stroke. The patient's score is based upon: CHF History: 1 (HFmrEF) HTN History: 1 Diabetes History: 0 Stroke History: 0 Vascular Disease History: 1 Age Score: 2 Gender Score: 0            Physical Exam:   VS:  BP 132/70   Pulse (!) 53   Ht 6' (1.829 m)   Wt 196 lb 8 oz (89.1 kg)   SpO2 98%   BMI 26.65 kg/m    Wt Readings from Last 3 Encounters:  02/19/24 196 lb 8 oz (89.1 kg)  01/25/24 198 lb 9.6 oz (90.1 kg)  11/21/23 191 lb (86.6 kg)    GEN: Well nourished, well developed in no acute distress NECK: No JVD; No carotid bruits CARDIAC: IRIR, no murmurs, rubs, gallops RESPIRATORY:  Clear to auscultation without rales, wheezing or rhonchi  ABDOMEN: Soft, non-tender, non-distended EXTREMITIES:  No edema; No deformity   ASSESSMENT AND PLAN: .    Leg pain - notable on recent trips and with walking since that time in bilateral legs from knees down. Reports legs feel sore, uncomfortable. Plan for ABI.   Persistent atrial fibrillation s/p ablation X2/hypercoagulable state - Managed  per Dr. Cindie and AFib Clinic. Maintaining NSR on Tikosyn .  Continue Eliquis  5mg  BID, denies bleeding complications, does not meet dose reduction criteria.   HFmrEF - Euvolemic and well compensated on exam. GDMT Nebivolol  5mg  daily, Losartan  25mg  daily. No indication for loop diuretic. If increased  symptoms with HFmrEF, plan for SGLT2i. Update echocardiogram. Consider SGLT2i pending result.   Hypertension- BP well controlled. Continue current antihypertensive regimen.  Nonobstructive CAD / HLD, LDL goal <70- 09/13/23 LDL 61. Stable with no anginal symptoms. No indication for ischemic evaluation.  GDMT Nebivolol  5mg  daily, Gemfibrozil  600mg  daily. Recommend aiming for 150 minutes of moderate intensity activity per week and following a heart healthy diet.    OSA - CPAP compliance encouraged.        Dispo: follow up in 6 months  Signed, Reche GORMAN Finder, NP

## 2024-02-23 ENCOUNTER — Encounter: Payer: Self-pay | Admitting: Physical Medicine and Rehabilitation

## 2024-02-23 ENCOUNTER — Encounter: Attending: Physical Medicine and Rehabilitation | Admitting: Physical Medicine and Rehabilitation

## 2024-02-23 VITALS — BP 145/75 | HR 59 | Ht 72.0 in | Wt 195.6 lb

## 2024-02-23 DIAGNOSIS — M792 Neuralgia and neuritis, unspecified: Secondary | ICD-10-CM | POA: Insufficient documentation

## 2024-02-23 MED ORDER — PREGABALIN 100 MG PO CAPS: 100.0000 mg | ORAL_CAPSULE | Freq: Three times a day (TID) | ORAL | 3 refills | Status: AC

## 2024-02-23 MED ORDER — CAPSAICIN-CLEANSING GEL 8 % EX KIT
2.0000 | PACK | Freq: Once | CUTANEOUS | Status: AC
  Administered 2024-02-23: 2 via TOPICAL

## 2024-02-23 NOTE — Progress Notes (Signed)
 Subjective:    Patient ID: Evan Robinson, male    DOB: [birth year], 78 y.o.   MRN: [id number]   HPI:  1) Neuropathy:  Evan Robinson is a 78 y.o. male who presents for f/u of facial pain, shoulder pain, and capelike distribution of upper back and neck pain.  -continues to have benefit from Qutenza  and turmeric -he is ready to repeat Qutenza  today -he enjoyed his trip to [place] with his wife but developed lower extremity leg pain bilaterally while there, he has discussed with his cardiologist and they are planning vascular testing -has had less burning when we apply ice packs -he would like to reduce Lyrica  in the future but is nervous too at this time, because when we tried this in the past he had a resurgence in pain -he tries to ignore the pain -he did not receive additional $250 bill last visit -ready to try again today -does not need medication refills -has been able to enjoy vacations with his wife.  -we previously weaned lyrica  to 75mg  -He reports he has facial pain and neck pain radiating into his bilateral shoulders  -his pain is much better controlled since we have started applying the Qutenza  patches -he has felt that these and the turmeric have been most helpful for his pain -he does feel burning and tingling during the first couple of days after application but this dissapates shortly afterward -he requests ice packs to be applied immediately after application -he would like to repeat Qutenza  today -he would like to try ice packs with application -he does not need refill of lyrica  today -he had a good trip with his wife to [place] recently -he plans to travel with her more, moving and staying busy helps his pain He current has no sunburns -he has plans to travel this summer with his wife -he maintains on his prior medications and does not need any further refills today He has burning in left sided facial pain and shoulder It is worst with friction and exacerbated  with lying down.  He has been on the Lyrica  for years. He is currently 100mg  BID and he is able to tolerate this dose well. He had dizziness with higher doses He has not had cognitive side effects He did well with the memory recall.  He has never tried CBD oil.  Pain worsens with shaving He tried aloe vera without great benefit He is excited to try Qutenza  today- it gave him about 1 month of relief last visit.  He is not sure if he has tried carbamazepine  Capsaicin  made his skin burn and raw in the past.  -He is interested in trying medical marijuana if it gets approved.  -He asks for a refill of Lyrica  -He is ready for Qutenza  today -Had a good Christmas and New Year's Family  -he does have some dizziness -had too many side effects from gabapentin   2) Impaired balance:  -would like to try topamax  instead of Lyrica  as decreasing the Lyrica  improved his balance -he would eventually like to wean off the Lyrica  further  3) HTN -BP is 130/86    Pain Inventory Average Pain 8 Pain Right Now 7 My pain is burning  In the last 24 hours, has pain interfered with the following? General activity 7 Relation with others 8 Enjoyment of life 7 What TIME of day is your pain at its worst? evening and night Sleep (in general) Fair  Pain is worse with: sitting Pain improves with: medication  Relief from Meds: 4  Family History  Problem Relation Age of Onset   Alcohol  abuse Father    Cancer Father        prostate   CAD Father        possible MI   Alcohol  abuse Paternal Grandfather    Stroke Mother    Diabetes Neg Hx    Social History   Socioeconomic History   Marital status: Married    Spouse name: Not on file   Number of children: Not on file   Years of education: Not on file   Highest education level: Not on file  Occupational History   Not on file  Tobacco Use   Smoking status: Never   Smokeless tobacco: Never   Tobacco comments:    Never smoked 05/29/23  Vaping Use    Vaping status: Never Used  Substance and Sexual Activity   Alcohol  use: No   Drug use: No   Sexual activity: Not on file  Other Topics Concern   Not on file  Social History Narrative   Gene   Lives with wife   Occupation: retired, was Dietitian   Activity: walks daily, some gym   Diet:    Social Drivers of Corporate investment banker Strain: Not on file  Food Insecurity: No Food Insecurity (05/29/2023)   Hunger Vital Sign    Worried About Running Out of Food in the Last Year: Never true    Ran Out of Food in the Last Year: Never true  Transportation Needs: No Transportation Needs (05/29/2023)   PRAPARE - Administrator, Civil Service (Medical): No    Lack of Transportation (Non-Medical): No  Physical Activity: Not on file  Stress: Not on file  Social Connections: Socially Integrated (05/29/2023)   Social Connection and Isolation Panel    Frequency of Communication with Friends and Family: More than three times a week    Frequency of Social Gatherings with Friends and Family: More than three times a week    Attends Religious Services: More than 4 times per year    Active Member of Clubs or Organizations: Yes    Attends Banker Meetings: More than 4 times per year    Marital Status: Married   Past Surgical History:  Procedure Laterality Date   ATRIAL FIBRILLATION ABLATION N/A 07/30/2021   Procedure: ATRIAL FIBRILLATION ABLATION;  Surgeon: Cindie Ole DASEN, MD;  Location: [facility];  Service: Cardiovascular;  Laterality: N/A;   ATRIAL FIBRILLATION ABLATION N/A 10/25/2022   Procedure: ATRIAL FIBRILLATION ABLATION;  Surgeon: Cindie Ole DASEN, MD;  Location: [facility];  Service: Cardiovascular;  Laterality: N/A;   CHOLECYSTECTOMY  2006   COLONOSCOPY WITH PROPOFOL  N/A 02/01/2017   Procedure: COLONOSCOPY WITH PROPOFOL ;  Surgeon: Toledo, Ladell POUR, MD;  Location: [facility];  Service: Gastroenterology;  Laterality: N/A;    ESOPHAGOGASTRODUODENOSCOPY (EGD) WITH PROPOFOL  N/A 02/01/2017   Procedure: ESOPHAGOGASTRODUODENOSCOPY (EGD) WITH PROPOFOL ;  Surgeon: Toledo, Ladell POUR, MD;  Location: [facility];  Service: Gastroenterology;  Laterality: N/A;   HERNIA REPAIR  08/2010   PROSTATECTOMY  2003   Past Surgical History:  Procedure Laterality Date   ATRIAL FIBRILLATION ABLATION N/A 07/30/2021   Procedure: ATRIAL FIBRILLATION ABLATION;  Surgeon: Cindie Ole DASEN, MD;  Location: [facility];  Service: Cardiovascular;  Laterality: N/A;   ATRIAL FIBRILLATION ABLATION N/A 10/25/2022   Procedure: ATRIAL FIBRILLATION ABLATION;  Surgeon: Cindie Ole DASEN, MD;  Location: [facility];  Service: Cardiovascular;  Laterality: N/A;   CHOLECYSTECTOMY  2006   COLONOSCOPY WITH PROPOFOL  N/A 02/01/2017   Procedure: COLONOSCOPY WITH PROPOFOL ;  Surgeon: Toledo, Ladell POUR, MD;  Location: [facility];  Service: Gastroenterology;  Laterality: N/A;   ESOPHAGOGASTRODUODENOSCOPY (EGD) WITH PROPOFOL  N/A 02/01/2017   Procedure: ESOPHAGOGASTRODUODENOSCOPY (EGD) WITH PROPOFOL ;  Surgeon: Toledo, Ladell POUR, MD;  Location: [facility];  Service: Gastroenterology;  Laterality: N/A;   HERNIA REPAIR  08/2010   PROSTATECTOMY  2003   Past Medical History:  Diagnosis Date   Dyslipidemia    H/O prostate cancer 05/02/2001   s/p radical prostatectomy (uro Dr. Althia in [place] yearly)   Headache 05/02/2010   thorough w/u - unrevealing.  has seen 3 neurologists, MRI, LP nonacute.  ESR = 3   HTN (hypertension)    Hypogonadism male    Low testosterone     Migraines    OSA on CPAP    Persistent atrial fibrillation (HCC) 06/28/2022   Renal cyst 12/01/2010   complex cyst upper pole L kidney with no malignancy by MRI   Vertigo    Vitamin D  deficiency    BP (!) 145/75   Pulse (!) 59   Ht 6' (1.829 m)   Wt 195 lb 9.6 oz (88.7 kg)   SpO2 98%   BMI 26.53 kg/m   Opioid Risk Score:   Fall Risk Score:  `1  Depression screen [facility]  2/9     02/23/2024    9:51 AM 08/22/2023    9:30 AM 05/19/2023    9:45 AM 02/16/2023    9:32 AM 08/15/2022    9:52 AM 02/07/2022   10:16 AM 11/15/2021    1:51 PM  Depression screen PHQ 2/9  Decreased Interest 0 0 0 0 0 0 0  Down, Depressed, Hopeless 0 0 0 0 0 0 0  PHQ - 2 Score 0 0 0 0 0 0 0      Review of Systems  Constitutional: Negative.   HENT: Negative.    Eyes: Negative.   Respiratory: Negative.    Cardiovascular: Negative.   Gastrointestinal: Negative.   Endocrine: Negative.   Musculoskeletal:  Positive for neck pain.       Nerve pain , pain in shoulders ,   Skin: Negative.   Allergic/Immunologic: Negative.   Neurological: Negative.   Hematological: Negative.   Psychiatric/Behavioral: Negative.         Objective:   Physical Exam Vitals and nursing note reviewed. BMI 26.18, BP 144/76, weight 193 lbs Constitutional:      Appearance: Normal appearance.  Cardiovascular:     Rate and Rhythm: Normal rate and regular rhythm.     Pulses: Normal pulses.     Heart sounds: Normal heart sounds.  Pulmonary:     Effort: Pulmonary effort is normal.     Breath sounds: Normal breath sounds.  Musculoskeletal:     Cervical back: Normal range of motion and neck supple.     Comments: Normal Muscle Bulk and Muscle Testing Reveals:  Upper Extremities: Full ROM and Muscle Strength  5/5 Lower Extremities: Full ROM and Muscle Strength 5/5 Arises from chair with ease Narrow Based  Gait  Tight cervical myofascia Forward protracted posture, stable 10//24/25    General: Skin is warm and dry. No open lesions over back or shoulders. No sunburn, erythema after treatment Neurological:     Mental Status: He is alert and oriented to person, place, and time.  Psychiatric:  Mood and Affect: Mood normal.        Behavior: Behavior normal.         Assessment & Plan:   1. 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             Lidoderm  patch/ointment, Zyrtec, Tomapax, Almond milk, Elavil , Ketamine infusion (cound not tolerate), hypotherapy, compound medication, hypnosis without benefit             Unable to trial carbamazepine  as patient is on Eliquis              Steroids made symptoms more severe             Side effects with Cymbalta, oxcarbazepine , Pamelor              CompletedPhysical Therapy two days a week. Continue HEP            Continue Lyrica  to 100/100/75, add topamax  25mg  in the evening Pain. Continue HEP as Tolerated and Continue to Monitor.   Turmeric to reduce inflammation--can be used in cooking or taken as a supplement.  Benefits of turmeric:  -Highly anti-inflammatory  -Increases antioxidants  -Improves memory, attention, brain disease  -Lowers risk of heart disease  -May help prevent cancer  -Decreases pain  -Alleviates depression  -Delays aging and decreases risk of chronic disease  -Consume with black pepper to increase absorption    Turmeric Milk Recipe:  1 cup milk  1 tsp turmeric  1 tsp cinnamon  1 tsp grated ginger (optional)  Black pepper (boosts the anti-inflammatory properties of turmeric).  1 tsp honey   Recommend topical CBD oil- discussed its benefits in reducing inflammation, pain, insomnia, and anxiety.  -Discussed that CBD oil differs from marijuana in that it does not contain THC- the substance that causes euphoria.  -Discussed that it is made from the hemp plant.  -It has been used for thousands of years Prescribed Zynex Nexwave, cervical traction device, and heating/cooling blanket  -preliminary research suggests that if may be able to shrink cancerous tumors, stop plaque formation in Alzheimer's Disease, and slow the  progress of brain disease from concussions.  -Additional benefits that have been demonstrated in studies include improved nausea, indigestion, and brain health, and reduced seizures.  -In a survey 92% of patients who tried medical cannabis felt it improved symptoms such as chronic pain, arthritis, migraines, and cancer.    -recommended certain CBD brand  -discussed procedures that could potentially help, but could also make the pain worse.   -Provided with a pain relief journal and discussed that it contains foods and lifestyle tips to naturally help to improve pain. Discussed that these lifestyle strategies are also very good for health unlike some medications which can have negative side effects. Discussed that the act of keeping a journal can be therapeutic and helpful to realize patterns what helps to trigger and alleviate pain.    -continue B complex supplement.   -Discussed Qutenza  as an option for neuropathic pain control. Discussed that this is a capsaicin  patch, stronger than capsaicin  cream. Discussed that it is currently approved for diabetic peripheral neuropathy and post-herpetic neuralgia, but that  it has also shown benefit in treating other forms of neuropathy. Provided patient with link to site to learn more about the patch: https://www.qutenza .com/. Discussed that the patch would be placed in office and benefits usually last 3 months. Discussed that unintended exposure to capsaicin  can cause severe irritation of eyes, mucous membranes, respiratory tract, and skin, but that Qutenza  is a local treatment and does not have the systemic side effects of other nerve medications. Discussed that there may be pain, itching, erythema, and decreased sensory function associated with the application of Qutenza . Side effects usually subside within 1 week. A cold pack of analgesic medications can help with these side effects. Blood pressure can also be increased due to pain associated with administration of  the patch.   2 patches of Qutenza  [phone]) was applied to the area of pain. Ice packs were applied during the procedure to ensure patient comfort. Blood pressure was monitored every 15 minutes. The patient tolerated the procedure well. Post-procedure instructions were given and follow-up has been scheduled.  Topical system measures 14cm x20cm (280cm for a total 1120units) were applied which will cause deeper penetration for destruction of the peripheral nerve using a chemical (Qutenza ) which infuses into the skin like an injection and heat technique (occlusive, compressive dressing cauing endothermic heat technique)      2, Chronic Pain Syndrome:  Continue Lyrica , d/c Baclofen  5 mg QHS. Continue to Monitor. No reports of drowsiness. Continue to monitor.  -Discussed benefits of exercise in reducing pain. -prescribed Zynex cervical traction device and heating blanket  -Discussed Qutenza  as an option for neuropathic pain control. Discussed that this is a capsaicin  patch, stronger than capsaicin  cream. Discussed that it is currently approved for diabetic peripheral neuropathy and post-herpetic neuralgia, but that it has also shown benefit in treating other forms of neuropathy. Provided patient with link to site to learn more about the patch: https://www.qutenza .com/. Discussed that the patch would be placed in office and benefits usually last 3 months. Discussed that unintended exposure to capsaicin  can cause severe irritation of eyes, mucous membranes, respiratory tract, and skin, but that Qutenza  is a local treatment and does not have the systemic side effects of other nerve medications. Discussed that there may be pain, itching, erythema, and decreased sensory function associated with the application of Qutenza . Side effects usually subside within 1 week. A cold pack of analgesic medications can help with these side effects. Blood pressure can also be increased due to pain associated with administration of  the patch.   2 patches of Qutenza  [phone]) was applied to bilateral upper back and neck muscles. Ice packs were applied during the procedure to ensure patient comfort. Blood pressure was monitored every 15 minutes. The patient tolerated the procedure well. Post-procedure instructions were given and follow-up has been scheduled.  Topical system measures 14cm x20cm (280cm for a total 1120units) were applied which will cause deeper penetration for destruction of the peripheral nerve using a chemical (Qutenza ) which infuses into the skin like an injection and heat technique (occlusive, compressive dressing cauing endothermic heat technique)      -Discussed current symptoms of pain and history of pain.  -Discussed benefits of exercise in reducing pain. -Discussed following foods that may reduce pain: 1) Ginger (especially studied for arthritis)- reduce leukotriene production to decrease inflammation 2) Blueberries- high in phytonutrients that decrease inflammation 3) Salmon- marine omega-3s reduce joint swelling and pain 4) Pumpkin seeds- reduce inflammation 5) dark chocolate- reduces inflammation 6) turmeric- reduces inflammation 7) tart cherries -  reduce pain and stiffness 8) extra virgin olive oil - its compound olecanthal helps to block prostaglandins  9) chili peppers- can be eaten or applied topically via capsaicin  10) mint- helpful for headache, muscle aches, joint pain, and itching 11) garlic- reduces inflammation 12) Green tea- reduces inflammation and oxidative stress, helps with weight loss, may reduce the risk of cancer, recommend Double Green Matcha [place] of Tea daily  Link to further information on diet for chronic pain: http://www.bray.com/   3. Impaired balance: affected by Lyrica . Decrease Lyrica  to 100/100/75, add topamax  25mg  HS -dicussed goal to wean medication further whenever he is ready for this  4.  Hypersensitive skin: discussed that he may feel increased pain from Qutenza  due to this.   5. Insomnia: Refilled Lyrica  -Try to go outside near sunrise -Get exercise during the day.  -Turn off all devices an hour before bedtime.  -Teas that can benefit: chamomile, valerian root, Brahmi (Bacopa) -Can consider over the counter melatonin, magnesium , and/or L-theanine. Melatonin is an anti-oxidant with multiple health benefits. Magnesium  is involved in greater than 300 enzymatic reactions in the body and most of us  are deficient as our soil is often depleted. There are 7 different types of magnesium - Bioptemizer's is a supplement with all 7 types, and each has unique benefits. Magnesium  can also help with constipation and anxiety.  -Pistachios naturally increase the production of melatonin -Cozy Earth bamboo bed sheets are free from toxic chemicals.  -Tart cherry juice or a tart cherry supplement can improve sleep and soreness post-workout   6. Overweight BMI 25.90, weight 191 lbs -discussed that capsaicin  has been associated with weight loss -commended on weight loss! -prescribed topamax  25mg  HS  7. HTN: BP reviewed and is stable today -Advised checking BP daily at home and logging results to bring into follow-up appointment with PCP and myself. -Reviewed BP meds today.  -Advised regarding healthy foods that can help lower blood pressure and provided with a list: 1) citrus foods- high in vitamins and minerals 2) salmon and other fatty fish - reduces inflammation and oxylipins 3) swiss chard (leafy green)- high level of nitrates 4) pumpkin seeds- one of the best natural sources of magnesium  5) Beans and lentils- high in fiber, magnesium , and potassium 6) Berries- high in flavonoids 7) Amaranth (whole grain, can be cooked similarly to rice and oats)- high in magnesium  and fiber 8) Pistachios- even more effective at reducing BP than other nuts 9) Carrots- high in phenolic compounds that  relax blood vessels and reduce inflammation 10) Celery- contain phthalides that relax tissues of arterial walls 11) Tomatoes- can also improve cholesterol and reduce risk of heart disease 12) Broccoli- good source of magnesium , calcium, and potassium 13) Greek yogurt: high in potassium and calcium 14) Herbs and spices: Celery seed, cilantro, saffron, lemongrass, black cumin, ginseng, cinnamon, cardamom, sweet basil, and ginger 15) Chia and flax seeds- also help to lower cholesterol and blood sugar 16) Beets- high levels of nitrates that relax blood vessels  17) spinach and bananas- high in potassium  -Provided lise of supplements that can help with hypertension:  1) magnesium : one high quality brand is Bioptemizers since it contains all 7 types of magnesium , otherwise over the counter magnesium  gluconate 400mg  is a good option 2) B vitamins 3) vitamin D  4) potassium 5) CoQ10 6) L-arginine 7) Vitamin C  8) Beetroot -Educated that goal BP is 120/80. -Made goal to incorporate some of the above foods into diet.    8) Migraines: -sumatriptan  prescribed

## 2024-03-18 ENCOUNTER — Ambulatory Visit (INDEPENDENT_AMBULATORY_CARE_PROVIDER_SITE_OTHER)

## 2024-03-18 ENCOUNTER — Ambulatory Visit (HOSPITAL_BASED_OUTPATIENT_CLINIC_OR_DEPARTMENT_OTHER): Payer: Self-pay | Admitting: Family

## 2024-03-18 DIAGNOSIS — M79604 Pain in right leg: Secondary | ICD-10-CM | POA: Diagnosis not present

## 2024-03-18 DIAGNOSIS — M79605 Pain in left leg: Secondary | ICD-10-CM

## 2024-03-18 DIAGNOSIS — I77810 Thoracic aortic ectasia: Secondary | ICD-10-CM

## 2024-03-18 DIAGNOSIS — I25118 Atherosclerotic heart disease of native coronary artery with other forms of angina pectoris: Secondary | ICD-10-CM

## 2024-03-18 LAB — VAS US ABI WITH/WO TBI
Left ABI: 1.22
Right ABI: 1.26

## 2024-03-18 LAB — ECHOCARDIOGRAM COMPLETE
Area-P 1/2: 2.22 cm2
S' Lateral: 3.28 cm

## 2024-03-18 NOTE — Telephone Encounter (Signed)
 Results were sent to the pts active mychart account to review by [name], NP.    Pt did view this.  Last read by Burwell Doody Gene at 1:21PM on 03/18/2024.   Will go ahead and place the repeat echo in one year in the system and mychart the pt that our scheduling team will reach out to him closer to that time frame to arrange this appt.

## 2024-03-18 NOTE — Telephone Encounter (Signed)
-----   Message from Reche GORMAN Finder sent at 03/18/2024  1:16 PM EST ----- Echocardiogram with normal heart pumping function. This is a reassuring result!  Mild leaking of the mitral valve.  Mild prolapse of the mitral valve.  This is similar to previous. There was mild  dilation aortic root and ascending aorta both 40 mm.We prevent this from worsening by keeping BP well controlled. Repeat echo in 1 year for monitoring.  ----- Message ----- From: Interface, Three One Seven Sent: 03/18/2024   1:13 PM EST To: Reche GORMAN Finder, NP

## 2024-03-23 ENCOUNTER — Other Ambulatory Visit (HOSPITAL_COMMUNITY): Payer: Self-pay | Admitting: Internal Medicine

## 2024-03-25 ENCOUNTER — Other Ambulatory Visit (HOSPITAL_BASED_OUTPATIENT_CLINIC_OR_DEPARTMENT_OTHER): Payer: Self-pay

## 2024-03-25 MED ORDER — MAGNESIUM OXIDE 400 MG PO TABS
400.0000 mg | ORAL_TABLET | Freq: Every day | ORAL | 1 refills | Status: AC
  Filled 2024-03-25: qty 90, 90d supply, fill #0

## 2024-04-25 ENCOUNTER — Observation Stay (HOSPITAL_BASED_OUTPATIENT_CLINIC_OR_DEPARTMENT_OTHER)
Admission: EM | Admit: 2024-04-25 | Discharge: 2024-04-28 | Disposition: A | Attending: Hospitalist | Admitting: Hospitalist

## 2024-04-25 ENCOUNTER — Encounter (HOSPITAL_BASED_OUTPATIENT_CLINIC_OR_DEPARTMENT_OTHER): Payer: Self-pay | Admitting: Emergency Medicine

## 2024-04-25 ENCOUNTER — Other Ambulatory Visit: Payer: Self-pay

## 2024-04-25 DIAGNOSIS — I4819 Other persistent atrial fibrillation: Secondary | ICD-10-CM | POA: Diagnosis not present

## 2024-04-25 DIAGNOSIS — Z9989 Dependence on other enabling machines and devices: Secondary | ICD-10-CM | POA: Diagnosis not present

## 2024-04-25 DIAGNOSIS — R110 Nausea: Secondary | ICD-10-CM | POA: Diagnosis not present

## 2024-04-25 DIAGNOSIS — Z7901 Long term (current) use of anticoagulants: Secondary | ICD-10-CM | POA: Diagnosis not present

## 2024-04-25 DIAGNOSIS — G43909 Migraine, unspecified, not intractable, without status migrainosus: Secondary | ICD-10-CM | POA: Insufficient documentation

## 2024-04-25 DIAGNOSIS — F419 Anxiety disorder, unspecified: Secondary | ICD-10-CM | POA: Insufficient documentation

## 2024-04-25 DIAGNOSIS — D62 Acute posthemorrhagic anemia: Secondary | ICD-10-CM | POA: Diagnosis not present

## 2024-04-25 DIAGNOSIS — R590 Localized enlarged lymph nodes: Secondary | ICD-10-CM | POA: Diagnosis not present

## 2024-04-25 DIAGNOSIS — G4700 Insomnia, unspecified: Secondary | ICD-10-CM | POA: Insufficient documentation

## 2024-04-25 DIAGNOSIS — J358 Other chronic diseases of tonsils and adenoids: Secondary | ICD-10-CM

## 2024-04-25 DIAGNOSIS — R040 Epistaxis: Secondary | ICD-10-CM

## 2024-04-25 DIAGNOSIS — G4733 Obstructive sleep apnea (adult) (pediatric): Secondary | ICD-10-CM | POA: Diagnosis not present

## 2024-04-25 DIAGNOSIS — Z8546 Personal history of malignant neoplasm of prostate: Secondary | ICD-10-CM | POA: Insufficient documentation

## 2024-04-25 DIAGNOSIS — D6869 Other thrombophilia: Secondary | ICD-10-CM | POA: Diagnosis not present

## 2024-04-25 DIAGNOSIS — I10 Essential (primary) hypertension: Secondary | ICD-10-CM | POA: Diagnosis not present

## 2024-04-25 DIAGNOSIS — I4811 Longstanding persistent atrial fibrillation: Secondary | ICD-10-CM | POA: Insufficient documentation

## 2024-04-25 DIAGNOSIS — R042 Hemoptysis: Secondary | ICD-10-CM | POA: Diagnosis not present

## 2024-04-25 LAB — CBC WITH DIFFERENTIAL/PLATELET
Abs Immature Granulocytes: 0.05 K/uL (ref 0.00–0.07)
Basophils Absolute: 0.1 K/uL (ref 0.0–0.1)
Basophils Relative: 1 %
Eosinophils Absolute: 0.2 K/uL (ref 0.0–0.5)
Eosinophils Relative: 2 %
HCT: 39.5 % (ref 39.0–52.0)
Hemoglobin: 13.7 g/dL (ref 13.0–17.0)
Immature Granulocytes: 1 %
Lymphocytes Relative: 11 %
Lymphs Abs: 1 K/uL (ref 0.7–4.0)
MCH: 30.2 pg (ref 26.0–34.0)
MCHC: 34.7 g/dL (ref 30.0–36.0)
MCV: 87.2 fL (ref 80.0–100.0)
Monocytes Absolute: 0.8 K/uL (ref 0.1–1.0)
Monocytes Relative: 9 %
Neutro Abs: 7.1 K/uL (ref 1.7–7.7)
Neutrophils Relative %: 76 %
Platelets: 177 K/uL (ref 150–400)
RBC: 4.53 MIL/uL (ref 4.22–5.81)
RDW: 12.9 % (ref 11.5–15.5)
WBC: 9.2 K/uL (ref 4.0–10.5)
nRBC: 0 % (ref 0.0–0.2)

## 2024-04-25 LAB — BASIC METABOLIC PANEL WITH GFR
Anion gap: 10 (ref 5–15)
BUN: 15 mg/dL (ref 8–23)
CO2: 26 mmol/L (ref 22–32)
Calcium: 9 mg/dL (ref 8.9–10.3)
Chloride: 99 mmol/L (ref 98–111)
Creatinine, Ser: 0.73 mg/dL (ref 0.61–1.24)
GFR, Estimated: >60 mL/min
Glucose, Bld: 121 mg/dL — ABNORMAL HIGH (ref 70–99)
Potassium: 4.1 mmol/L (ref 3.5–5.1)
Sodium: 136 mmol/L (ref 135–145)

## 2024-04-25 MED ORDER — OXYMETAZOLINE HCL 0.05 % NA SOLN
1.0000 | Freq: Once | NASAL | Status: DC
  Filled 2024-04-25: qty 30

## 2024-04-25 NOTE — ED Provider Notes (Signed)
 "  [place] [facility]  Provider Note  CSN: [id number] Arrival date & time: 04/25/24 2208  History Chief Complaint  Patient presents with   Epistaxis    Evan Robinson is a 78 y.o. male with history of afib, on Eliquis  reports nosebleed, bilateral, starting around 1400hrs. The nosebleed has stopped but he reports the last few hours he's been coughing up blood. No emesis. No fever. He reports some sorethroat with difficulty swallowing on occasion. No SOB.    Home Medications Prior to Admission medications  Medication Sig Start Date End Date Taking? Authorizing Provider  ALPRAZolam  (XANAX ) 0.5 MG tablet Take 0.5 mg by mouth as needed. 09/01/23   [provider]  Ascorbic Acid  (VITAMIN C  PO) Take 1 tablet by mouth daily.    [provider]  ASHWAGANDHA PO Take 1 tablet by mouth every morning.    [provider]  b complex vitamins capsule Take 1 capsule by mouth daily.    [provider]  baclofen  (LIORESAL ) 10 MG tablet Take 10 mg by mouth as needed. 12/09/21   [provider]  Carboxymethylcellul-Glycerin  (CLEAR EYES FOR DRY EYES OP) Place 1 drop into both eyes 3 (three) times daily as needed (dryness, irritation).    [provider]  dofetilide  (TIKOSYN ) 250 MCG capsule Take 1 capsule (250 mcg total) by mouth 2 (two) times daily. 12/25/23   Terra Fairy PARAS, PA-C  ELIQUIS  5 MG TABS tablet TAKE 1 TABLET(5 MG) BY MOUTH TWICE DAILY 10/30/23   Cindie Ole DASEN, MD  gemfibrozil  (LOPID ) 600 MG tablet Take 600 mg by mouth daily.    [provider]  losartan  (COZAAR ) 25 MG tablet TAKE 1 TABLET(25 MG) BY MOUTH EVERY DAY 05/04/23   Walker, Caitlin S, NP  magnesium  oxide (MAG-OX) 400 MG tablet Take 1 tablet (400 mg total) by mouth daily at 12 noon. 03/25/24   Terra Fairy PARAS, PA-C  meclizine  (ANTIVERT ) 25 MG tablet Take 1 tablet (25 mg total) by mouth 3 (three) times daily as needed for dizziness.  02/05/24   Howell Lunger, DO  nebivolol  (BYSTOLIC ) 5 MG tablet Take 1 tablet (5 mg total) by mouth daily. 09/11/23   Vannie Reche RAMAN, NP  pregabalin  (LYRICA ) 100 MG capsule Take 1 capsule (100 mg total) by mouth 3 (three) times daily. 02/23/24   Raulkar, Sven SQUIBB, MD  SUMAtriptan  (IMITREX ) 50 MG tablet Take 1 tablet (50 mg total) by mouth once as needed for migraine. May repeat in 2 hours if headache persists or recurs. 08/22/23 08/22/24  Lorilee Sven SQUIBB, MD  Turmeric (QC TUMERIC COMPLEX PO) Take 1 tablet by mouth every morning.    [provider]  zolpidem  (AMBIEN ) 10 MG tablet Take 10 mg by mouth at bedtime. 12/27/21   [provider]     Allergies    Asa [aspirin], Nsaids, and Tricor [fenofibrate]   Review of Systems   Review of Systems Please see HPI for pertinent positives and negatives  Physical Exam BP (!) 176/90 (BP Location: Left Arm)   Pulse 70   Temp 98.4 F (36.9 C)   Resp 19   Ht 6' (1.829 m)   Wt 86.2 kg   SpO2 99%   BMI 25.77 kg/m   Physical Exam Vitals and nursing note reviewed.  Constitutional:      Appearance: Normal appearance.  HENT:     Head: Normocephalic and atraumatic.     Nose: Nose normal.  Comments: No active bleeding or clots    Mouth/Throat:     Mouth: Mucous membranes are moist.     Comments: Posterior pharynx is erythematous, some swelling of R tonsil compared to the L. No active bleeding, no exudate.  Eyes:     Extraocular Movements: Extraocular movements intact.     Conjunctiva/sclera: Conjunctivae normal.  Cardiovascular:     Rate and Rhythm: Normal rate.  Pulmonary:     Effort: Pulmonary effort is normal.     Breath sounds: No wheezing, rhonchi or rales.  Abdominal:     General: Abdomen is flat.     Palpations: Abdomen is soft.     Tenderness: There is no abdominal tenderness.  Musculoskeletal:        General: No swelling. Normal range of motion.     Cervical back: Neck supple.  Lymphadenopathy:      Cervical: No cervical adenopathy.  Skin:    General: Skin is warm and dry.  Neurological:     General: No focal deficit present.     Mental Status: He is alert.  Psychiatric:        Mood and Affect: Mood normal.     ED Results / Procedures / Treatments   EKG None  Procedures Procedures  Medications Ordered in the ED Medications  oxymetazoline  (AFRIN) 0.05 % nasal spray 1 spray (0 sprays Each Nare Hold 04/25/24 2324)  lactated ringers  infusion (has no administration in time range)  ALPRAZolam  (XANAX ) tablet 0.5 mg (has no administration in time range)  iohexol  (OMNIPAQUE ) 300 MG/ML solution 75 mL (75 mLs Intravenous Contrast Given 04/26/24 0133)    Initial Impression and Plan  Patient here with nosebleed earlier in the day now with occasional hemoptysis described as clots. He has some tonsilar edema/erythema, but no fever or lymphadenopathy. Will check labs and CXR, likely will need CT imaging to eval tonsilar abscess/mass as well.   ED Course   Clinical Course as of 04/26/24 0402  Thu Apr 25, 2024  2339 CBC is unremarkable.  [CS]  2355 BMP is unremarkable.  [CS]  Fri Apr 26, 2024  0006 Strep is neg.  [CS]  0016 Covid/Flu/RSV swab is neg.  [CS]  9944 I personally viewed the images from radiology studies and agree with radiologist interpretation: CXR is clear. Will send for CT to eval source of bleeding.  [CS]  0216 I personally viewed the images from radiology studies and agree with radiologist interpretation: CT chest is neg for acute abnormality or source of bleeding. CT neck concerning for tonsillar malignancy. He continues to have episode of hemoptysis, including one specimen he saved which appears to be a clot in shape of bronchus (see photo above). Given continued hemoptysis on Eliquis  and new tonsillar mass will plan admission for further evaluation and management.  [CS]  [phone] Spoke with Dr. Cleatus, hospitalist, who will accept for admission.  [CS]  0350 Patient has had  two episodes of hematemesis, dark red blood. I suspect this is blood he had swallowed earlier in the night, but he remains hemodynamically stable. Will give a dose of zofran  and recheck H/H. Dr. Cleatus informed via secure chat.  [CS]  0402 Repeat Hgb without significant change.  [CS]    Clinical Course User Index [CS] Roselyn Carlin NOVAK, MD     MDM Rules/Calculators/A&P Medical Decision Making Problems Addressed: Epistaxis: acute illness or injury Hemoptysis: acute illness or injury Tonsillar mass: undiagnosed new problem with uncertain prognosis  Amount and/or Complexity of Data  Reviewed Labs: ordered. Decision-making details documented in ED Course. Radiology: ordered and independent interpretation performed. Decision-making details documented in ED Course.  Risk OTC drugs. Prescription drug management. Decision regarding hospitalization.     Final Clinical Impression(s) / ED Diagnoses Final diagnoses:  Hemoptysis  Epistaxis  Tonsillar mass    Rx / DC Orders ED Discharge Orders     None        Roselyn Carlin NOVAK, MD 04/26/24 [phone]  "

## 2024-04-25 NOTE — ED Triage Notes (Signed)
 Pt c/o epistaxis since appx 1400 today. C/o coughing up blood, weakness.   Pt takes eliquis .

## 2024-04-26 ENCOUNTER — Emergency Department (HOSPITAL_BASED_OUTPATIENT_CLINIC_OR_DEPARTMENT_OTHER)

## 2024-04-26 DIAGNOSIS — R040 Epistaxis: Secondary | ICD-10-CM | POA: Diagnosis present

## 2024-04-26 DIAGNOSIS — R042 Hemoptysis: Principal | ICD-10-CM

## 2024-04-26 DIAGNOSIS — I10 Essential (primary) hypertension: Secondary | ICD-10-CM | POA: Diagnosis not present

## 2024-04-26 DIAGNOSIS — J358 Other chronic diseases of tonsils and adenoids: Secondary | ICD-10-CM | POA: Diagnosis present

## 2024-04-26 DIAGNOSIS — Z743 Need for continuous supervision: Secondary | ICD-10-CM | POA: Diagnosis not present

## 2024-04-26 LAB — RESP PANEL BY RT-PCR (RSV, FLU A&B, COVID)  RVPGX2
Influenza A by PCR: NEGATIVE
Influenza B by PCR: NEGATIVE
Resp Syncytial Virus by PCR: NEGATIVE
SARS Coronavirus 2 by RT PCR: NEGATIVE

## 2024-04-26 LAB — GROUP A STREP BY PCR: Group A Strep by PCR: NOT DETECTED

## 2024-04-26 LAB — HEMOGLOBIN AND HEMATOCRIT, BLOOD
HCT: 37 % — ABNORMAL LOW (ref 39.0–52.0)
Hemoglobin: 12.9 g/dL — ABNORMAL LOW (ref 13.0–17.0)

## 2024-04-26 MED ORDER — ONDANSETRON HCL 4 MG/2ML IJ SOLN
4.0000 mg | Freq: Four times a day (QID) | INTRAMUSCULAR | Status: DC | PRN
  Administered 2024-04-26: 4 mg via INTRAVENOUS
  Filled 2024-04-26: qty 2

## 2024-04-26 MED ORDER — MELATONIN 5 MG PO TABS
5.0000 mg | ORAL_TABLET | Freq: Every day | ORAL | Status: DC
  Administered 2024-04-26 – 2024-04-27 (×2): 5 mg via ORAL
  Filled 2024-04-26 (×2): qty 1

## 2024-04-26 MED ORDER — LOSARTAN POTASSIUM 50 MG PO TABS
25.0000 mg | ORAL_TABLET | Freq: Every day | ORAL | Status: DC
  Administered 2024-04-26 – 2024-04-28 (×3): 25 mg via ORAL
  Filled 2024-04-26 (×3): qty 1

## 2024-04-26 MED ORDER — PROCHLORPERAZINE EDISYLATE 10 MG/2ML IJ SOLN: 5.0000 mg | Freq: Four times a day (QID) | INTRAMUSCULAR | Status: DC | PRN

## 2024-04-26 MED ORDER — PREGABALIN 100 MG PO CAPS
100.0000 mg | ORAL_CAPSULE | Freq: Three times a day (TID) | ORAL | Status: DC
  Administered 2024-04-26 – 2024-04-28 (×7): 100 mg via ORAL
  Filled 2024-04-26 (×7): qty 1

## 2024-04-26 MED ORDER — DOFETILIDE 250 MCG PO CAPS
250.0000 ug | ORAL_CAPSULE | Freq: Two times a day (BID) | ORAL | Status: DC
  Administered 2024-04-26 – 2024-04-28 (×5): 250 ug via ORAL
  Filled 2024-04-26 (×6): qty 1

## 2024-04-26 MED ORDER — ALPRAZOLAM 0.25 MG PO TABS
0.2500 mg | ORAL_TABLET | Freq: Two times a day (BID) | ORAL | Status: DC | PRN
  Administered 2024-04-27: 0.25 mg via ORAL
  Filled 2024-04-26: qty 1

## 2024-04-26 MED ORDER — ALPRAZOLAM 0.5 MG PO TABS
0.5000 mg | ORAL_TABLET | Freq: Once | ORAL | Status: AC
  Administered 2024-04-26: 0.5 mg via ORAL
  Filled 2024-04-26: qty 1

## 2024-04-26 MED ORDER — SUMATRIPTAN SUCCINATE 50 MG PO TABS: 50.0000 mg | ORAL_TABLET | Freq: Once | ORAL | Status: DC | PRN

## 2024-04-26 MED ORDER — SODIUM CHLORIDE 0.9 % IV SOLN
12.5000 mg | Freq: Four times a day (QID) | INTRAVENOUS | Status: DC | PRN
  Administered 2024-04-26: 12.5 mg via INTRAVENOUS
  Filled 2024-04-26: qty 12.5

## 2024-04-26 MED ORDER — IOHEXOL 300 MG/ML  SOLN
75.0000 mL | Freq: Once | INTRAMUSCULAR | Status: AC | PRN
  Administered 2024-04-26: 75 mL via INTRAVENOUS

## 2024-04-26 MED ORDER — MAGNESIUM OXIDE -MG SUPPLEMENT 400 (240 MG) MG PO TABS
400.0000 mg | ORAL_TABLET | Freq: Every day | ORAL | Status: DC
  Administered 2024-04-26 – 2024-04-27 (×2): 400 mg via ORAL
  Filled 2024-04-26 (×2): qty 1

## 2024-04-26 MED ORDER — LACTATED RINGERS IV SOLN: INTRAVENOUS | Status: AC

## 2024-04-26 MED ORDER — ONDANSETRON HCL 4 MG/2ML IJ SOLN
4.0000 mg | Freq: Once | INTRAMUSCULAR | Status: AC
  Administered 2024-04-26: 4 mg via INTRAVENOUS
  Filled 2024-04-26: qty 2

## 2024-04-26 MED ORDER — NEBIVOLOL HCL 5 MG PO TABS: 5.0000 mg | ORAL_TABLET | Freq: Every day | ORAL | Status: DC

## 2024-04-26 NOTE — Clinical Note (Incomplete)
 Evan Robinson

## 2024-04-26 NOTE — ED Notes (Signed)
 ED Provider at bedside.

## 2024-04-26 NOTE — ED Notes (Addendum)
 Pt is now having bloody emesis, approx noted in bag, no clots per pt. EDP made aware

## 2024-04-26 NOTE — ED Notes (Signed)
 [facility] called for transport.

## 2024-04-26 NOTE — H&P (Addendum)
 " History and Physical    Patient: Evan Robinson FMW:[id number] DOB: [birth year] DOA: 04/25/2024 DOS: the patient was seen and examined on 04/26/2024 PCP: Charlene Rosina SQUIBB, MD  Patient coming from: Home  Chief Complaint:  Chief Complaint  Patient presents with   Epistaxis   HPI: Evan Robinson is a 78 y.o. 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  The patient presented with bleeding from the throat, which began around 1400 yesterday. It initially started as light bleeding and then progressed to significant bleeding by 2000, characterized by coughing up large amounts of blood which caused the pt to present to [facility] for evaluation.   In the ED, pt hypertensive. Labs unremarkable (Hb 13.7-->12.9 over 5 hours). 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 [facility] EDP requested transfer to [facility] medicine telemetry unit for ongoing epistaxis.    Review of Systems: As mentioned in the history of present illness. All other systems reviewed and are negative. Past Medical History:  Diagnosis Date   Dyslipidemia    H/O prostate cancer 05/02/2001   s/p radical prostatectomy (uro Dr. Althia in [place] yearly)   Headache 05/02/2010   thorough w/u - unrevealing.  has seen 3 neurologists, MRI, LP nonacute.  ESR = 3   HTN (hypertension)    Hypogonadism male    Low testosterone     Migraines    OSA on CPAP    Persistent atrial fibrillation (HCC) 06/28/2022   Renal cyst 12/01/2010   complex cyst upper pole L kidney with no malignancy by MRI   Vertigo    Vitamin D  deficiency    Past Surgical History:  Procedure Laterality Date   ATRIAL FIBRILLATION ABLATION N/A 07/30/2021   Procedure: ATRIAL FIBRILLATION  ABLATION;  Surgeon: Cindie Ole DASEN, MD;  Location: [facility] INVASIVE CV LAB;  Service: Cardiovascular;  Laterality: N/A;   ATRIAL FIBRILLATION ABLATION N/A 10/25/2022   Procedure: ATRIAL FIBRILLATION ABLATION;  Surgeon: Cindie Ole DASEN, MD;  Location: [facility] INVASIVE CV LAB;  Service: Cardiovascular;  Laterality: N/A;   CHOLECYSTECTOMY  2006   COLONOSCOPY WITH PROPOFOL  N/A 02/01/2017   Procedure: COLONOSCOPY WITH PROPOFOL ;  Surgeon: Toledo, Ladell POUR, MD;  Location: [facility] ENDOSCOPY;  Service: Gastroenterology;  Laterality: N/A;   ESOPHAGOGASTRODUODENOSCOPY (EGD) WITH PROPOFOL  N/A 02/01/2017   Procedure: ESOPHAGOGASTRODUODENOSCOPY (EGD) WITH PROPOFOL ;  Surgeon: Toledo, Ladell POUR, MD;  Location: [facility] ENDOSCOPY;  Service: Gastroenterology;  Laterality: N/A;   HERNIA REPAIR  08/2010   PROSTATECTOMY  2003   Social History:  reports that he has never smoked. He has never used smokeless tobacco. He reports that he does not drink alcohol  and does not use drugs.  Allergies[1]  Family History  Problem Relation Age of Onset   Alcohol  abuse Father    Cancer Father        prostate   CAD Father        possible MI   Alcohol  abuse Paternal Grandfather    Stroke Mother    Diabetes Neg Hx     Prior to Admission medications  Medication Sig Start Date End Date Taking? Authorizing Provider  ALPRAZolam  (XANAX ) 0.5 MG tablet Take 0.5 mg by mouth as needed. 09/01/23   [provider]  Ascorbic Acid  (VITAMIN C  PO) Take 1 tablet by  mouth daily.    [provider]  ASHWAGANDHA PO Take 1 tablet by mouth every morning.    [provider]  b complex vitamins capsule Take 1 capsule by mouth daily.    [provider]  baclofen  (LIORESAL ) 10 MG tablet Take 10 mg by mouth as needed. 12/09/21   [provider]  Carboxymethylcellul-Glycerin  (CLEAR EYES FOR DRY EYES OP) Place 1 drop into both eyes 3 (three) times daily as needed (dryness, irritation).    [provider]   dofetilide  (TIKOSYN ) 250 MCG capsule Take 1 capsule (250 mcg total) by mouth 2 (two) times daily. 12/25/23   Terra Fairy PARAS, PA-C  ELIQUIS  5 MG TABS tablet TAKE 1 TABLET(5 MG) BY MOUTH TWICE DAILY 10/30/23   Cindie Ole DASEN, MD  gemfibrozil  (LOPID ) 600 MG tablet Take 600 mg by mouth daily.    [provider]  losartan  (COZAAR ) 25 MG tablet TAKE 1 TABLET(25 MG) BY MOUTH EVERY DAY 05/04/23   Walker, Caitlin S, NP  magnesium  oxide (MAG-OX) 400 MG tablet Take 1 tablet (400 mg total) by mouth daily at 12 noon. 03/25/24   Terra Fairy PARAS, PA-C  meclizine  (ANTIVERT ) 25 MG tablet Take 1 tablet (25 mg total) by mouth 3 (three) times daily as needed for dizziness. 02/05/24   Howell Lunger, DO  nebivolol  (BYSTOLIC ) 5 MG tablet Take 1 tablet (5 mg total) by mouth daily. 09/11/23   Vannie Reche RAMAN, NP  pregabalin  (LYRICA ) 100 MG capsule Take 1 capsule (100 mg total) by mouth 3 (three) times daily. 02/23/24   Raulkar, Sven SQUIBB, MD  SUMAtriptan  (IMITREX ) 50 MG tablet Take 1 tablet (50 mg total) by mouth once as needed for migraine. May repeat in 2 hours if headache persists or recurs. 08/22/23 08/22/24  Lorilee Sven SQUIBB, MD  Turmeric (QC TUMERIC COMPLEX PO) Take 1 tablet by mouth every morning.    [provider]  zolpidem  (AMBIEN ) 10 MG tablet Take 10 mg by mouth at bedtime. 12/27/21   [provider]    Physical Exam: Vitals:   04/26/24 0308 04/26/24 0408 04/26/24 0448 04/26/24 0731  BP: (!) 166/93 (!) 159/85 (!) 133/97 117/67  Pulse: 70 70 74 72  Resp: 18 18 18 17   Temp:  98 F (36.7 C) 98.7 F (37.1 C) 98.2 F (36.8 C)  TempSrc:  Oral Oral   SpO2: 100% 99% 98% 98%  Weight:      Height:       General: Alert, oriented x3, resting comfortably in no acute distress HEENT: EOMI, oropharynx clear, moist mucous membranes, hearing intact Neck: Trachea midline and no gross thyromegaly Respiratory: Lungs clear to auscultation bilaterally with normal respiratory effort; no  w/r/r Cardiovascular: Regular rate and rhythm w/o m/r/g Abdomen: Soft, nontender, nondistended. Positive bowel sounds MSK: No obvious joint deformities or swelling Skin: No obvious rashes or lesions Neurologic: Awake, alert, spontaneously moves all extremities, strength intact Psychiatric: Appropriate mood and affect, conversational and cooperative   Data Reviewed:  Lab Results  Component Value Date   WBC 9.2 04/25/2024   HGB 12.9 (L) 04/26/2024   HCT 37.0 (L) 04/26/2024   MCV 87.2 04/25/2024   PLT 177 04/25/2024   Lab Results  Component Value Date   GLUCOSE 121 (H) 04/25/2024   CALCIUM 9.0 04/25/2024   NA 136 04/25/2024   K 4.1 04/25/2024   CO2 26 04/25/2024   CL 99 04/25/2024   BUN 15 04/25/2024   CREATININE 0.73 04/25/2024   Lab  Results  Component Value Date   ALT 15 12/15/2022   AST 23 12/15/2022   ALKPHOS 75 12/15/2022   BILITOT 0.9 12/15/2022   Lab Results  Component Value Date   INR 1.4 (H) 10/04/2021   Radiology: CT Soft Tissue Neck W Contrast Result Date: 04/26/2024 EXAM: CT NECK WITH CONTRAST 04/26/2024 01:32:00 AM TECHNIQUE: CT of the neck was performed with the administration of intravenous contrast. Multiplanar reformatted images are provided for review. Automated exposure control, iterative reconstruction, and/or weight based adjustment of the mA/kV was utilized to reduce the radiation dose to as low as reasonably achievable. COMPARISON: None available. CLINICAL HISTORY: hemoptysis, tonsilar enlargement, eval mass or source of bleeding FINDINGS: AERODIGESTIVE TRACT: Enhancing soft tissue involving the right glossotonsillar sulcus, which extends posteriorly along the lateral and posterior wall of the right oropharynx, concerning for squamous cell carcinoma. SALIVARY GLANDS: The parotid and submandibular glands are unremarkable. THYROID : Unremarkable. LYMPH NODES: Centrally necrotic, enlarged, and abnormal 2.7 cm right level II lymph node, compatible with nodal  metastasis. Shaggy surrounding fat planes is concerning for extranodal Multiple additional asymmetrically enlarged and slightly rounded right level III and IV nodes are indeterminant, but suspicious. SOFT TISSUES: No mass or fluid collection. BONES: No abnormality. OTHER: Right maxillary sinus mucosal thickening. Visualized lungs are clear. IMPRESSION: 1. 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 Recommend ENT consultation and correlation with direct inspection. 2. Conglomerate right level II nodal metastasis with suspected extranodal extension. 3. Multiple additional asymmetrically enlarged and slightly rounded right level III and IV nodes are indeterminant, but suspicious. Consider PET CT for more definitive nodal staging. Electronically signed by: Gilmore Molt 04/26/2024 02:01 AM EST RP Workstation: [id number]   CT Chest W Contrast Result Date: 04/26/2024 EXAM: CT CHEST WITH CONTRAST 04/26/2024 01:32:00 AM TECHNIQUE: CT of the chest was performed with the administration of 75 mL iohexol  (OMNIPAQUE ) 300 MG/ML solution. Multiplanar reformatted images are provided for review. Automated exposure control, iterative reconstruction, and/or weight based adjustment of the mA/kV was utilized to reduce the radiation dose to as low as reasonably achievable. COMPARISON: None available. CLINICAL HISTORY: hemoptysis, normal CXR. FINDINGS: MEDIASTINUM: Coronary artery and aortic atherosclerotic calcification. The central airways are clear. LYMPH NODES: No mediastinal, hilar or axillary lymphadenopathy. LUNGS AND PLEURA: Mild scarring in the lower lobes. No focal consolidation or pulmonary edema. No pleural effusion or pneumothorax. SOFT TISSUES/BONES: No acute abnormality of the bones or soft tissues. UPPER ABDOMEN: Limited images of the upper abdomen demonstrates no acute abnormality. IMPRESSION: 1. No acute abnormality. Electronically signed  by: Norman Gatlin MD 04/26/2024 01:44 AM EST RP Workstation: [id number]   DG Chest 2 View Result Date: 04/26/2024 EXAM: 2 VIEW(S) XRAY OF THE CHEST 04/26/2024 12:40:00 AM COMPARISON: 12/15/2022 CLINICAL HISTORY: hemoptysis FINDINGS: LUNGS AND PLEURA: Mild elevation of right hemidiaphragm, unchanged. No focal pulmonary opacity. No pleural effusion. No pneumothorax. HEART AND MEDIASTINUM: No acute abnormality of the cardiac and mediastinal silhouettes. BONES AND SOFT TISSUES: No acute osseous abnormality. IMPRESSION: 1. No acute cardiopulmonary abnormality. 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  R tonsillar mass Hemoptysis Epistaxis -ENT consulted; apprec eval/recs -S/p Afrin x1 in ED; will defer to ENT for nasal packing recs if deemed necessary  PAF -  HOLD pta Eliquis  for now -PTA Tikosyn   HTN -PTA losartan  25mg  daily  Migraines -PTA sumatriptan  prn  Insomnia -HOLD pta zolpidem  10mg  nightly -Start melatonin 5mg  nightly   Advance Care Planning:   Code Status: Full Code   Consults: ENT  Family Communication: Spouse  Severity of Illness: The appropriate patient status for this patient is INPATIENT. Inpatient status is judged to be reasonable and necessary in order to provide the required intensity of service to ensure the patient's safety. The patient's presenting symptoms, physical exam findings, and initial radiographic and laboratory data in the context of their chronic comorbidities is felt to place them at high risk for further clinical deterioration. Furthermore, it is not anticipated that the patient will be medically stable for discharge from the hospital within 2 midnights of admission.   * I certify that at the point of  admission it is my clinical judgment that the patient will require inpatient hospital care spanning beyond 2 midnights from the point of admission due to high intensity of service, high risk for further deterioration and high frequency of surveillance required.*   ------- I spent 55 minutes reviewing previous notes, at the bedside counseling/discussing the treatment plan, and performing clinical documentation.  Author: Marsha Ada, MD 04/26/2024 8:07 AM  For on call review [email or web address].      [1]  Allergies Allergen Reactions   Asa [Aspirin] Other (See Comments)    Avoids due to Eliquis .   Nsaids Other (See Comments)    Avoids due to Eliquis .   Tricor [Fenofibrate] Other (See Comments)    Myalgias    "

## 2024-04-26 NOTE — Care Management Obs Status (Signed)
 MEDICARE OBSERVATION STATUS NOTIFICATION   Patient Details  Name: Evan Robinson MRN: [id number] Date of Birth: 1945-12-09   Medicare Observation Status Notification Given:  Yes    Jennie Laneta Dragon 04/26/2024, 2:21 PM

## 2024-04-27 DIAGNOSIS — J358 Other chronic diseases of tonsils and adenoids: Secondary | ICD-10-CM | POA: Diagnosis present

## 2024-04-27 DIAGNOSIS — R042 Hemoptysis: Secondary | ICD-10-CM | POA: Diagnosis not present

## 2024-04-27 LAB — CBC
HCT: 28.7 % — ABNORMAL LOW (ref 39.0–52.0)
Hemoglobin: 9.9 g/dL — ABNORMAL LOW (ref 13.0–17.0)
MCH: 30 pg (ref 26.0–34.0)
MCHC: 34.5 g/dL (ref 30.0–36.0)
MCV: 87 fL (ref 80.0–100.0)
Platelets: 169 K/uL (ref 150–400)
RBC: 3.3 MIL/uL — ABNORMAL LOW (ref 4.22–5.81)
RDW: 13.2 % (ref 11.5–15.5)
WBC: 8.1 K/uL (ref 4.0–10.5)
nRBC: 0 % (ref 0.0–0.2)

## 2024-04-27 LAB — BASIC METABOLIC PANEL WITH GFR
Anion gap: 9 (ref 5–15)
BUN: 38 mg/dL — ABNORMAL HIGH (ref 8–23)
CO2: 24 mmol/L (ref 22–32)
Calcium: 8.5 mg/dL — ABNORMAL LOW (ref 8.9–10.3)
Chloride: 104 mmol/L (ref 98–111)
Creatinine, Ser: 0.75 mg/dL (ref 0.61–1.24)
GFR, Estimated: >60 mL/min
Glucose, Bld: 139 mg/dL — ABNORMAL HIGH (ref 70–99)
Potassium: 3.5 mmol/L (ref 3.5–5.1)
Sodium: 136 mmol/L (ref 135–145)

## 2024-04-27 LAB — MAGNESIUM: Magnesium: 1.8 mg/dL (ref 1.7–2.4)

## 2024-04-27 LAB — HEMOGLOBIN AND HEMATOCRIT, BLOOD
HCT: 28.3 % — ABNORMAL LOW (ref 39.0–52.0)
Hemoglobin: 9.8 g/dL — ABNORMAL LOW (ref 13.0–17.0)

## 2024-04-27 MED ORDER — SALINE SPRAY 0.65 % NA SOLN
2.0000 | NASAL | Status: DC
  Administered 2024-04-27 – 2024-04-28 (×7): 2 via NASAL
  Filled 2024-04-27: qty 44

## 2024-04-27 MED ORDER — MAGNESIUM OXIDE -MG SUPPLEMENT 400 (240 MG) MG PO TABS
400.0000 mg | ORAL_TABLET | Freq: Two times a day (BID) | ORAL | Status: DC
  Administered 2024-04-28: 400 mg via ORAL
  Filled 2024-04-27 (×2): qty 1

## 2024-04-27 MED ORDER — OXYMETAZOLINE HCL 0.05 % NA SOLN
2.0000 | Freq: Two times a day (BID) | NASAL | Status: DC
  Administered 2024-04-28: 2 via NASAL
  Filled 2024-04-27: qty 30

## 2024-04-27 MED ORDER — POTASSIUM CHLORIDE 20 MEQ PO PACK
40.0000 meq | PACK | Freq: Two times a day (BID) | ORAL | Status: AC
  Administered 2024-04-27 (×2): 40 meq via ORAL
  Filled 2024-04-27 (×2): qty 2

## 2024-04-27 MED ORDER — MAGNESIUM OXIDE -MG SUPPLEMENT 400 (240 MG) MG PO TABS: 400.0000 mg | ORAL_TABLET | Freq: Every day | ORAL | Status: DC

## 2024-04-27 NOTE — Consult Note (Signed)
 Reason for Consult:Epistaxis and tonsil mass Referring Physician: Dr. Mcarthur Barters Evan Robinson is an 78 y.o. male.  HPI:  History from telephone call with Dr. Mcarthur, chart check and discussion with RN.   Evan Robinson is a 78 y.o. 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   The patient presented with bleeding from the throat, which began around 1400 on 12/24 it initially started as light bleeding and then progressed to significant bleeding by 2000, characterized by coughing up large amounts of blood which caused the pt to present to [facility] for evaluation.    In the ED, pt hypertensive. Labs unremarkable (Hb 13.7-->12.9 over 5 hours). 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    Nasal cavity and oral cavity hemostatic since 7 am today.   ROS Blood pressure 123/67, pulse 76, temperature 98.5 F (36.9 C), temperature source Oral, resp. rate 16, height 6' (1.829 m), weight 86.2 kg, SpO2 97%. Physical Exam Hgb reviewed    Assessment/Plan:  Epistaxis - likely 2/2 anticoagulation and hypertension - recommend systolic <160 if medically feasible - afrin 2-3 sprays BID x 3 days - if supplemental O2 is needed, prefer humidified O2 and via mouth - manual pressure x 15 minutes + afrin if epistasis returns. consider packing vs 1g txa if severe, please contact ENT - nasal saline spray q2h while awake - use humidifier at home at night - recommend 24 observation without bleeding before discharge  Right oropharynx mass with suspected nodal metastasis  - CT showing right tonsillar mass with 3 pathologic appearing lymph nodes on the right side.  - Recommend tissue sampling, will discuss biopsy via  neck FNA/core biopsy -He has never smoked, will request p16 testing - if oral bleeding occurs, consider IV TXA and TXA swish, gargle spit/swallow, please contact ENT  H&H ordered for review  Follow up with me early next week, call for appointment.   Penne Croak 04/27/2024, 4:19 PM

## 2024-04-27 NOTE — Progress Notes (Signed)
 " PROGRESS NOTE    Evan Robinson  FMW:[id number] DOB: 1945-09-12 DOA: 04/25/2024 PCP: Charlene Rosina SQUIBB, MD   Brief Narrative:   Evan Robinson is a 78 y.o. 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   The patient presented with bleeding from the throat, which began around 1400 on 12/24 it initially started as light bleeding and then progressed to significant bleeding by 2000, characterized by coughing up large amounts of blood which caused the pt to present to [facility] for evaluation.    In the ED, pt hypertensive. Labs unremarkable (Hb 13.7-->12.9 over 5 hours). 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  Admitted for further observation and evaluation.   Assessment & Plan:   Principal Problem:   Hemoptysis Active Problems:   OSA on CPAP   HTN (hypertension)   Hypercoagulable state due to persistent atrial fibrillation (HCC)   Tonsillar mass   Epistaxis is resolved.  Patient continues to have some blood-tinged sputum but improved from yesterday.  Hemoglobin had dropped down to 9.9, could be dilutional given that he received some IV fluid.  Hemodynamically stable with blood pressure in 150s systolic. Discussed with ENT Dr. Anice who will review the imaging.  Suspect the patient could be discharged if bleeding stops for 24 hours.  Patient can be worked up as outpatient if bleeding stops. -Will continue to hold Eliquis  today  -A-fib, patient is on Tikosyn , will continue.  Potassium and magnesium  are relatively low and will be repleted.  -Hypertension: Continue losartan   Insomnia: As needed Ambien , melatonin  Anxiety/nausea: Continue as needed alprazolam   Acute blood loss  anemia: Hemoglobin down to 9.9 from preadmit level of 13.  Bleeding appears to have subsided today. Will recheck hemoglobin in a.m.   Status is: Observation The patient will require care spanning > 2 midnights and should be moved to inpatient because: Acute blood loss anemia plan anticoagulation therapy  Subjective:  Patient seen and examined at the bedside.  Spouse present.  He reports improvement in bleeding.  He still has some blood-tinged cough but nothing like yesterday.  Vital signs are stable. Reports nausea  Objective: Vitals:   04/26/24 1441 04/26/24 1934 04/27/24 0236 04/27/24 0732  BP: (!) 147/78 (!) 165/76 (!) 141/74 (!) 150/68  Pulse: 74 82 62 78  Resp: 15 16 19 16   Temp: 98.4 F (36.9 C) 98.2 F (36.8 C) 98 F (36.7 C) 98.6 F (37 C)  TempSrc:    Oral  SpO2: 99% 99% 99% 97%  Weight:      Height:        Intake/Output Summary (Last 24 hours) at 04/27/2024 1502 Last data filed at 04/26/2024 1700 Gross per 24 hour  Intake 120 ml  Output --  Net 120 ml   Filed Weights   04/25/24 2219  Weight: 86.2 kg    Examination:  General: Alert, oriented not in any acute distress Chest: Clear CVs: S1, S2, no murmur, regular rhythm Abdomen: Soft, nontender Extremities: No edema   Data Reviewed: I have personally reviewed following labs and imaging studies  CBC: Recent Labs  Lab 04/25/24 2319 04/26/24 0356 04/27/24 0737  WBC 9.2  --  8.1  NEUTROABS 7.1  --   --  HGB 13.7 12.9* 9.9*  HCT 39.5 37.0* 28.7*  MCV 87.2  --  87.0  PLT 177  --  169   Basic Metabolic Panel: Recent Labs  Lab 04/25/24 2319 04/27/24 0737  NA 136 136  K 4.1 3.5  CL 99 104  CO2 26 24  GLUCOSE 121* 139*  BUN 15 38*  CREATININE 0.73 0.75  CALCIUM 9.0 8.5*  MG  --  1.8   GFR: Estimated Creatinine Clearance: 82.2 mL/min (by C-G formula based on SCr of 0.75 mg/dL). Liver Function Tests: No results for input(s): AST, ALT, ALKPHOS, BILITOT, PROT, ALBUMIN in the  last 168 hours. No results for input(s): LIPASE, AMYLASE in the last 168 hours. No results for input(s): AMMONIA in the last 168 hours. Coagulation Profile: No results for input(s): INR, PROTIME in the last 168 hours. Cardiac Enzymes: No results for input(s): CKTOTAL, CKMB, CKMBINDEX, TROPONINI in the last 168 hours. BNP (last 3 results) No results for input(s): PROBNP in the last 8760 hours. HbA1C: No results for input(s): HGBA1C in the last 72 hours. CBG: No results for input(s): GLUCAP in the last 168 hours. Lipid Profile: No results for input(s): CHOL, HDL, LDLCALC, TRIG, CHOLHDL, LDLDIRECT in the last 72 hours. Thyroid  Function Tests: No results for input(s): TSH, T4TOTAL, FREET4, T3FREE, THYROIDAB in the last 72 hours. Anemia Panel: No results for input(s): VITAMINB12, FOLATE, FERRITIN, TIBC, IRON, RETICCTPCT in the last 72 hours. Sepsis Labs: No results for input(s): PROCALCITON, LATICACIDVEN in the last 168 hours.  Recent Results (from the past 240 hours)  Resp panel by RT-PCR (RSV, Flu A&B, Covid) Anterior Nasal Swab     Status: None   Collection Time: 04/25/24 11:19 PM   Specimen: Anterior Nasal Swab  Result Value Ref Range Status   SARS Coronavirus 2 by RT PCR NEGATIVE NEGATIVE Final    Comment: (NOTE) SARS-CoV-2 target nucleic acids are NOT DETECTED.  The SARS-CoV-2 RNA is generally detectable in upper respiratory specimens during the acute phase of infection. The lowest concentration of SARS-CoV-2 viral copies this assay can detect is 138 copies/mL. A negative result does not preclude SARS-Cov-2 infection and should not be used as the sole basis for treatment or other patient management decisions. A negative result may occur with  improper specimen collection/handling, submission of specimen other than nasopharyngeal swab, presence of viral mutation(s) within the areas targeted by this assay, and  inadequate number of viral copies(<138 copies/mL). A negative result must be combined with clinical observations, patient history, and epidemiological information. The expected result is Negative.  Fact Sheet for Patients:  bloggercourse.com  Fact Sheet for Healthcare Providers:  seriousbroker.it  This test is no t yet approved or cleared by the [place]  FDA and  has been authorized for detection and/or diagnosis of SARS-CoV-2 by FDA under an Emergency Use Authorization (EUA). This EUA will remain  in effect (meaning this test can be used) for the duration of the COVID-19 declaration under Section 564(b)(1) of the Act, 21 U.S.C.section 360bbb-3(b)(1), unless the authorization is terminated  or revoked sooner.       Influenza A by PCR NEGATIVE NEGATIVE Final   Influenza B by PCR NEGATIVE NEGATIVE Final    Comment: (NOTE) The Xpert Xpress SARS-CoV-2/FLU/RSV plus assay is intended as an aid in the diagnosis of influenza from Nasopharyngeal swab specimens and should not be used as a sole basis for treatment. Nasal washings and aspirates are unacceptable for Xpert Xpress SARS-CoV-2/FLU/RSV testing.  Fact Sheet for Patients: bloggercourse.com  Fact Sheet for Healthcare Providers: seriousbroker.it  This test is not yet approved or cleared by the [place]  FDA and has been authorized for detection and/or diagnosis of SARS-CoV-2 by FDA under an Emergency Use Authorization (EUA). This EUA will remain in effect (meaning this test can be used) for the duration of the COVID-19 declaration under Section 564(b)(1) of the Act, 21 U.S.C. section 360bbb-3(b)(1), unless the authorization is terminated or revoked.     Resp Syncytial Virus by PCR NEGATIVE NEGATIVE Final    Comment: (NOTE) Fact Sheet for Patients: bloggercourse.com  Fact Sheet for Healthcare  Providers: seriousbroker.it  This test is not yet approved or cleared by the [place]  FDA and has been authorized for detection and/or diagnosis of SARS-CoV-2 by FDA under an Emergency Use Authorization (EUA). This EUA will remain in effect (meaning this test can be used) for the duration of the COVID-19 declaration under Section 564(b)(1) of the Act, 21 U.S.C. section 360bbb-3(b)(1), unless the authorization is terminated or revoked.  Performed at [facility], [address] [place], [place] [address]   Group A Strep by PCR     Status: None   Collection Time: 04/25/24 11:19 PM   Specimen: Anterior Nasal Swab; Sterile Swab  Result Value Ref Range Status   Group A Strep by PCR NOT DETECTED NOT DETECTED Final    Comment: Performed at [facility], [address] [place], [place] [address]         Radiology Studies: CT Soft Tissue Neck W Contrast Result Date: 04/26/2024 EXAM: CT NECK WITH CONTRAST 04/26/2024 01:32:00 AM TECHNIQUE: CT of the neck was performed with the administration of intravenous contrast. Multiplanar reformatted images are provided for review. Automated exposure control, iterative reconstruction, and/or weight based adjustment of the mA/kV was utilized to reduce the radiation dose to as low as reasonably achievable. COMPARISON: None available. CLINICAL HISTORY: hemoptysis, tonsilar enlargement, eval mass or source of bleeding FINDINGS: AERODIGESTIVE TRACT: Enhancing soft tissue involving the right glossotonsillar sulcus, which extends posteriorly along the lateral and posterior wall of the right oropharynx, concerning for squamous cell carcinoma. SALIVARY GLANDS: The parotid and submandibular glands are unremarkable. THYROID : Unremarkable. LYMPH NODES: Centrally necrotic, enlarged, and abnormal 2.7 cm right level II lymph node, compatible with nodal metastasis. Shaggy surrounding fat planes is concerning for  extranodal Multiple additional asymmetrically enlarged and slightly rounded right level III and IV nodes are indeterminant, but suspicious. SOFT TISSUES: No mass or fluid collection. BONES: No abnormality. OTHER: Right maxillary sinus mucosal thickening. Visualized lungs are clear. IMPRESSION: 1. 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 Recommend ENT consultation and correlation with direct inspection. 2. Conglomerate right level II nodal metastasis with suspected extranodal extension. 3. Multiple additional asymmetrically enlarged and slightly rounded right level III and IV nodes are indeterminant, but suspicious. Consider PET CT for more definitive nodal staging. Electronically signed by: Gilmore Molt 04/26/2024 02:01 AM EST RP Workstation: [id number]   CT Chest W Contrast Result Date: 04/26/2024 EXAM: CT CHEST WITH CONTRAST 04/26/2024 01:32:00 AM TECHNIQUE: CT of the chest was performed with the administration of 75 mL iohexol  (OMNIPAQUE ) 300 MG/ML solution. Multiplanar reformatted images are provided for review. Automated exposure control, iterative reconstruction, and/or weight based adjustment of the mA/kV was utilized to reduce the radiation dose to as low as reasonably achievable. COMPARISON: None available. CLINICAL HISTORY: hemoptysis, normal CXR. FINDINGS: MEDIASTINUM: Coronary artery and aortic atherosclerotic calcification. The central  airways are clear. LYMPH NODES: No mediastinal, hilar or axillary lymphadenopathy. LUNGS AND PLEURA: Mild scarring in the lower lobes. No focal consolidation or pulmonary edema. No pleural effusion or pneumothorax. SOFT TISSUES/BONES: No acute abnormality of the bones or soft tissues. UPPER ABDOMEN: Limited images of the upper abdomen demonstrates no acute abnormality. IMPRESSION: 1. No acute abnormality. Electronically signed by: Norman Gatlin MD 04/26/2024 01:44 AM EST RP Workstation:  [id number]   DG Chest 2 View Result Date: 04/26/2024 EXAM: 2 VIEW(S) XRAY OF THE CHEST 04/26/2024 12:40:00 AM COMPARISON: 12/15/2022 CLINICAL HISTORY: hemoptysis FINDINGS: LUNGS AND PLEURA: Mild elevation of right hemidiaphragm, unchanged. No focal pulmonary opacity. No pleural effusion. No pneumothorax. HEART AND MEDIASTINUM: No acute abnormality of the cardiac and mediastinal silhouettes. BONES AND SOFT TISSUES: No acute osseous abnormality. IMPRESSION: 1. No acute cardiopulmonary abnormality. Electronically signed by: Norman Gatlin MD 04/26/2024 12:49 AM EST RP Workstation: [id number]        Scheduled Meds:  dofetilide   250 mcg Oral BID   losartan   25 mg Oral Daily   magnesium  oxide  400 mg Oral BID   melatonin  5 mg Oral QHS   oxymetazoline   1 spray Each Nare Once   potassium chloride   40 mEq Oral BID   pregabalin   100 mg Oral TID   Continuous Infusions:  promethazine  (PHENERGAN ) injection (IM or IVPB) 12.5 mg (04/26/24 2023)          Lindalee Huizinga, MD [facility] 04/27/2024, 3:02 PM   "

## 2024-04-27 NOTE — Plan of Care (Signed)

## 2024-04-28 ENCOUNTER — Other Ambulatory Visit: Payer: Self-pay | Admitting: Cardiology

## 2024-04-28 ENCOUNTER — Other Ambulatory Visit: Payer: Self-pay | Admitting: Family

## 2024-04-28 DIAGNOSIS — R042 Hemoptysis: Secondary | ICD-10-CM | POA: Diagnosis not present

## 2024-04-28 DIAGNOSIS — I5022 Chronic systolic (congestive) heart failure: Secondary | ICD-10-CM

## 2024-04-28 DIAGNOSIS — R040 Epistaxis: Secondary | ICD-10-CM

## 2024-04-28 DIAGNOSIS — I4819 Other persistent atrial fibrillation: Secondary | ICD-10-CM

## 2024-04-28 DIAGNOSIS — Z7901 Long term (current) use of anticoagulants: Secondary | ICD-10-CM

## 2024-04-28 DIAGNOSIS — J391 Other abscess of pharynx: Secondary | ICD-10-CM | POA: Diagnosis not present

## 2024-04-28 DIAGNOSIS — I10 Essential (primary) hypertension: Secondary | ICD-10-CM

## 2024-04-28 LAB — CBC WITH DIFFERENTIAL/PLATELET
Abs Immature Granulocytes: 0.05 K/uL (ref 0.00–0.07)
Basophils Absolute: 0.1 K/uL (ref 0.0–0.1)
Basophils Relative: 1 %
Eosinophils Absolute: 0.1 K/uL (ref 0.0–0.5)
Eosinophils Relative: 2 %
HCT: 29.2 % — ABNORMAL LOW (ref 39.0–52.0)
Hemoglobin: 10 g/dL — ABNORMAL LOW (ref 13.0–17.0)
Immature Granulocytes: 1 %
Lymphocytes Relative: 27 %
Lymphs Abs: 2.5 K/uL (ref 0.7–4.0)
MCH: 31 pg (ref 26.0–34.0)
MCHC: 34.2 g/dL (ref 30.0–36.0)
MCV: 90.4 fL (ref 80.0–100.0)
Monocytes Absolute: 0.7 K/uL (ref 0.1–1.0)
Monocytes Relative: 8 %
Neutro Abs: 5.9 K/uL (ref 1.7–7.7)
Neutrophils Relative %: 61 %
Platelets: 185 K/uL (ref 150–400)
RBC: 3.23 MIL/uL — ABNORMAL LOW (ref 4.22–5.81)
RDW: 13.3 % (ref 11.5–15.5)
WBC: 9.4 K/uL (ref 4.0–10.5)
nRBC: 0 % (ref 0.0–0.2)

## 2024-04-28 LAB — BASIC METABOLIC PANEL WITH GFR
Anion gap: 9 (ref 5–15)
BUN: 26 mg/dL — ABNORMAL HIGH (ref 8–23)
CO2: 23 mmol/L (ref 22–32)
Calcium: 8.7 mg/dL — ABNORMAL LOW (ref 8.9–10.3)
Chloride: 103 mmol/L (ref 98–111)
Creatinine, Ser: 0.81 mg/dL (ref 0.61–1.24)
GFR, Estimated: >60 mL/min
Glucose, Bld: 116 mg/dL — ABNORMAL HIGH (ref 70–99)
Potassium: 4.4 mmol/L (ref 3.5–5.1)
Sodium: 134 mmol/L — ABNORMAL LOW (ref 135–145)

## 2024-04-28 LAB — MAGNESIUM: Magnesium: 2.1 mg/dL (ref 1.7–2.4)

## 2024-04-28 MED ORDER — OXYMETAZOLINE HCL 0.05 % NA SOLN: 2.0000 | NASAL | 0 refills | Status: AC | PRN

## 2024-04-28 NOTE — Consult Note (Signed)
 Reason for Consult:Epistaxis and tonsil mass Referring Physician: Dr. Mcarthur Barters Evan is an 78 y.o. Robinson.  HPI:  History from telephone call with Dr. Mcarthur, chart check and discussion with RN.   Evan Robinson is a 78 y.o. Robinson with medical history significant of prostate cancer s/p radical prostatectomy, PAF on Eliquis  and Tikosyn ,  HTN, OSA on CPAP, and migraines who p/w epistaxis while on Eliquis  and found to have R tonsillar mass c/f SCC.   The patient presented with bleeding from the throat, which began around 1400 on 12/24 it initially started as light bleeding and then progressed to significant bleeding by 2000, characterized by coughing up large amounts of blood which caused the pt to present to [facility] for evaluation.    In the ED, pt hypertensive. Labs unremarkable (Hb 13.7-->12.9 over 5 hours). 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    Nasal cavity and oral cavity hemostatic since 7 am today.   04/28/2024 PSE. NAEON. No epistaxis or oral bleeding. No sore throat, ear pain, weight loss, dysphagia, dysphonia or hx of smoking.   ROS Blood pressure 121/78, pulse 78, temperature 97.6 F (36.4 C), resp. rate 16, height 6' (1.829 m), weight 86.2 kg, SpO2 99%. Physical Exam Constitutional:      General: He is not in acute distress.    Appearance: Normal appearance. He is normal weight. He is not ill-appearing.  HENT:     Head: Normocephalic and atraumatic.     Right Ear: External ear normal.     Left Ear: External ear normal.     Nose: Septal deviation present. No nasal deformity.     Right Nostril: No epistaxis.     Left Nostril: No epistaxis.     Mouth/Throat:     Mouth: Mucous membranes are moist.     Palate: No mass and lesions.     Pharynx: Oropharynx is clear. Uvula midline.  Eyes:     Extraocular Movements: Extraocular movements intact.      Pupils: Pupils are equal, round, and reactive to light.  Lymphadenopathy:     Cervical: Cervical adenopathy (right II and III) present.  Neurological:     Mental Status: He is alert.    Hgb reviewed stable x 24 hours   Assessment/Plan:  Physical exam with clear nares bilaterally. Oropharynx with mild tonsillar asymmetry but no obvious lesion for biopsy. Right neck with prominent lymphadenopathy.   Epistaxis - likely 2/2 anticoagulation and hypertension - recommend systolic <160 if medically feasible - afrin 2-3 sprays BID x 3 days - nasal saline spray q2h while awake - use humidifier at home at night  Right oropharynx mass with suspected nodal metastasis  - CT showing right tonsillar mass with 3 pathologic appearing lymph nodes on the right side.  - Recommend tissue sampling, recommend biopsy of neck lymphadenopathy with p16 testing.  Follow up with me early next week, call for appointment.   Penne Croak 04/28/2024, 1:54 PM

## 2024-04-28 NOTE — Discharge Summary (Addendum)
 Physician Discharge Summary  Evan Robinson FMW:[id number] DOB: 23-Apr-1946 DOA: 04/25/2024  PCP: Charlene Rosina SQUIBB, MD  Admit date: 04/25/2024 Discharge date: 04/28/2024  Admitted From: Home Disposition: Home  Recommendations for Outpatient Follow-up:  Follow up with PCP within a week.  Recommend blood work to check hemoglobin  Referral to IR placed for fine-needle aspiration of right neck lymph node is recommended by ENT.  Currently holding Eliquis  in the setting of severe bleeding as well as pending IR procedure.  Resume Eliquis  when appropriate.  Minimize interruption. ENT follow-up coming week  follow up in ED if recurrence of bleeding.  Home Health: No Equipment/Devices: None  Discharge Condition: Stable CODE STATUS: Full Diet recommendation: Heart healthy  Brief/Interim Summary:  Evan Robinson is a 78 y.o. 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   The patient presented with bleeding from the throat, which began around 1400 on 12/24 it initially started as light bleeding and then progressed to significant bleeding by 2000, characterized by coughing up large amounts of blood which caused the pt to present to [facility] for evaluation.    In the ED, pt hypertensive. Labs unremarkable (Hb 13.7-->12.9 over 5 hours). 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  Admitted for further observation and evaluation.  Eliquis  was held.  Bleeding fortunately stopped.  Hemoglobin dropped as low as 9.8 and has remained stable.  ENT was consulted.  Dr. Anice recommends IR needle aspiration of the cervical lymph node.  Referral placed.  Also follow-up with Dr. Anice next week. In the meantime,  patient will continue to hold Eliquis  given significant bleeding and need for procedure. - Recommend soft diet - Recommend Afrin if nasal bleeding  Stable issues: Atrial fibrillation: Status post ablation, currently on Tikosyn , will continue.  Holding anticoagulation as above.  Potassium and magnesium  was repleted this hospitalization. - Continue Bystolic   Hypertension, continue losartan   Insomnia, continue Ambien , melatonin  Anxiety/nausea: Continue alprazolam   Continue other prior to admission medications.    Discharge Diagnoses:  Principal Problem:   Hemoptysis Active Problems:   OSA on CPAP   HTN (hypertension)   Hypercoagulable state due to persistent atrial fibrillation (HCC)   Tonsillar mass    Discharge Instructions  Discharge Instructions     Ambulatory referral to ENT   Complete by: As directed    Ambulatory referral to Interventional Radiology   Complete by: As directed    Patient with newly diagnosed peritonsillar mass with right cervical lymphadenopathy with significant bleeding. ENT recommending fine-needle aspiration of right cervical lymph nodes   Discharge instructions   Complete by: As directed    1.  Hold Eliquis  for now until diagnostic procedure for neck mass is completed. 2.  Follow-up with ENT outpatient. 3.  Referral will be placed for outpatient fine-needle aspiration of right neck node. 4, Follow soft diet 5.  Return to the ER if recurrence of bleeding   Increase activity slowly   Complete by: As directed       Allergies as of 04/28/2024       Reactions   Asa [aspirin] Other (See Comments)   Avoids due to Eliquis .   Nsaids Other (See Comments)   Avoids due to Eliquis .   Tricor [fenofibrate] Other (  See Comments)   Myalgias         Medication List     PAUSE taking these medications    Eliquis  5 MG Tabs tablet Wait to take this until your doctor or other care provider tells you to start again. Generic drug: apixaban  TAKE 1  TABLET(5 MG) BY MOUTH TWICE DAILY       TAKE these medications    acetaminophen  500 MG tablet Commonly known as: TYLENOL  Take 1,000 mg by mouth every 6 (six) hours as needed for moderate pain (pain score 4-6).   ALPRAZolam  0.5 MG tablet Commonly known as: XANAX  Take 0.5 mg by mouth as needed.   ASHWAGANDHA PO Take 1 tablet by mouth every morning.   b complex vitamins capsule Take 1 capsule by mouth daily.   baclofen  10 MG tablet Commonly known as: LIORESAL  Take 10 mg by mouth as needed.   CLEAR EYES FOR DRY EYES OP Place 1 drop into both eyes 3 (three) times daily as needed (dryness, irritation).   dofetilide  250 MCG capsule Commonly known as: TIKOSYN  Take 1 capsule (250 mcg total) by mouth 2 (two) times daily.   gemfibrozil  600 MG tablet Commonly known as: LOPID  Take 600 mg by mouth daily.   losartan  25 MG tablet Commonly known as: COZAAR  TAKE 1 TABLET(25 MG) BY MOUTH EVERY DAY   magnesium  oxide 400 MG tablet Commonly known as: MAG-OX Take 1 tablet (400 mg total) by mouth daily at 12 noon.   meclizine  25 MG tablet Commonly known as: ANTIVERT  Take 1 tablet (25 mg total) by mouth 3 (three) times daily as needed for dizziness.   nebivolol  5 MG tablet Commonly known as: BYSTOLIC  Take 1 tablet (5 mg total) by mouth daily.   oxymetazoline  0.05 % nasal spray Commonly known as: AFRIN Place 2 sprays into both nostrils as needed (Nasal bleeding).   pregabalin  100 MG capsule Commonly known as: LYRICA  Take 1 capsule (100 mg total) by mouth 3 (three) times daily.   QC TUMERIC COMPLEX PO Take 1 tablet by mouth every morning.   SUMAtriptan  50 MG tablet Commonly known as: Imitrex  Take 1 tablet (50 mg total) by mouth once as needed for migraine. May repeat in 2 hours if headache persists or recurs.   VITAMIN C  PO Take 1 tablet by mouth daily.   zolpidem  10 MG tablet Commonly known as: AMBIEN  Take 10 mg by mouth at bedtime.        Follow-up Information      Charlene Rosina SQUIBB, MD. Schedule an appointment as soon as possible for a visit in 1 week(s).   Specialty: Internal Medicine Contact information: [address] [place] [address] [phone]                Allergies[1]  Consultations:    Procedures/Studies: CT Soft Tissue Neck W Contrast Result Date: 04/26/2024 EXAM: CT NECK WITH CONTRAST 04/26/2024 01:32:00 AM TECHNIQUE: CT of the neck was performed with the administration of intravenous contrast. Multiplanar reformatted images are provided for review. Automated exposure control, iterative reconstruction, and/or weight based adjustment of the mA/kV was utilized to reduce the radiation dose to as low as reasonably achievable. COMPARISON: None available. CLINICAL HISTORY: hemoptysis, tonsilar enlargement, eval mass or source of bleeding FINDINGS: AERODIGESTIVE TRACT: Enhancing soft tissue involving the right glossotonsillar sulcus, which extends posteriorly along the lateral and posterior wall of the right oropharynx, concerning for squamous cell carcinoma. SALIVARY GLANDS: The parotid and submandibular glands are unremarkable. THYROID :  Unremarkable. LYMPH NODES: Centrally necrotic, enlarged, and abnormal 2.7 cm right level II lymph node, compatible with nodal metastasis. Shaggy surrounding fat planes is concerning for extranodal Multiple additional asymmetrically enlarged and slightly rounded right level III and IV nodes are indeterminant, but suspicious. SOFT TISSUES: No mass or fluid collection. BONES: No abnormality. OTHER: Right maxillary sinus mucosal thickening. Visualized lungs are clear. IMPRESSION: 1. 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 Recommend ENT consultation and correlation with direct inspection. 2. Conglomerate right level II nodal metastasis with suspected extranodal extension. 3. Multiple additional asymmetrically enlarged and  slightly rounded right level III and IV nodes are indeterminant, but suspicious. Consider PET CT for more definitive nodal staging. Electronically signed by: Gilmore Molt 04/26/2024 02:01 AM EST RP Workstation: [id number]   CT Chest W Contrast Result Date: 04/26/2024 EXAM: CT CHEST WITH CONTRAST 04/26/2024 01:32:00 AM TECHNIQUE: CT of the chest was performed with the administration of 75 mL iohexol  (OMNIPAQUE ) 300 MG/ML solution. Multiplanar reformatted images are provided for review. Automated exposure control, iterative reconstruction, and/or weight based adjustment of the mA/kV was utilized to reduce the radiation dose to as low as reasonably achievable. COMPARISON: None available. CLINICAL HISTORY: hemoptysis, normal CXR. FINDINGS: MEDIASTINUM: Coronary artery and aortic atherosclerotic calcification. The central airways are clear. LYMPH NODES: No mediastinal, hilar or axillary lymphadenopathy. LUNGS AND PLEURA: Mild scarring in the lower lobes. No focal consolidation or pulmonary edema. No pleural effusion or pneumothorax. SOFT TISSUES/BONES: No acute abnormality of the bones or soft tissues. UPPER ABDOMEN: Limited images of the upper abdomen demonstrates no acute abnormality. IMPRESSION: 1. No acute abnormality. Electronically signed by: Norman Gatlin MD 04/26/2024 01:44 AM EST RP Workstation: [id number]   DG Chest 2 View Result Date: 04/26/2024 EXAM: 2 VIEW(S) XRAY OF THE CHEST 04/26/2024 12:40:00 AM COMPARISON: 12/15/2022 CLINICAL HISTORY: hemoptysis FINDINGS: LUNGS AND PLEURA: Mild elevation of right hemidiaphragm, unchanged. No focal pulmonary opacity. No pleural effusion. No pneumothorax. HEART AND MEDIASTINUM: No acute abnormality of the cardiac and mediastinal silhouettes. BONES AND SOFT TISSUES: No acute osseous abnormality. IMPRESSION: 1. No acute cardiopulmonary abnormality. Electronically signed by: Norman Gatlin MD 04/26/2024 12:49 AM EST RP Workstation: [id number]       Subjective:   Discharge Exam: Vitals:   04/28/24 0817 04/28/24 1402  BP: 121/78 109/60  Pulse: 78 69  Resp: 16 16  Temp: 97.6 F (36.4 C) 98 F (36.7 C)  SpO2: 99% 100%    General: Pt is alert, awake, not in acute distress HEENT: Right cervical lymph node+ Cardiovascular: r S1, S2, no murmur, regular rhythm  respiratory: bilateral decreased breath sounds at bases Abdominal: Soft, NT, ND, bowel sounds + Extremities: no edema, no cyanosis    The results of significant diagnostics from this hospitalization (including imaging, microbiology, ancillary and laboratory) are listed below for reference.     Microbiology: Recent Results (from the past 240 hours)  Resp panel by RT-PCR (RSV, Flu A&B, Covid) Anterior Nasal Swab     Status: None   Collection Time: 04/25/24 11:19 PM   Specimen: Anterior Nasal Swab  Result Value Ref Range Status   SARS Coronavirus 2 by RT PCR NEGATIVE NEGATIVE Final    Comment: (NOTE) SARS-CoV-2 target nucleic acids are NOT DETECTED.  The SARS-CoV-2 RNA is generally detectable in upper respiratory specimens during the acute phase of infection. The lowest concentration of SARS-CoV-2 viral copies this assay can detect is 138 copies/mL. A negative result does not preclude SARS-Cov-2 infection  and should not be used as the sole basis for treatment or other patient management decisions. A negative result may occur with  improper specimen collection/handling, submission of specimen other than nasopharyngeal swab, presence of viral mutation(s) within the areas targeted by this assay, and inadequate number of viral copies(<138 copies/mL). A negative result must be combined with clinical observations, patient history, and epidemiological information. The expected result is Negative.  Fact Sheet for Patients:  bloggercourse.com  Fact Sheet for Healthcare Providers:  seriousbroker.it  This test is  no t yet approved or cleared by the [place]  FDA and  has been authorized for detection and/or diagnosis of SARS-CoV-2 by FDA under an Emergency Use Authorization (EUA). This EUA will remain  in effect (meaning this test can be used) for the duration of the COVID-19 declaration under Section 564(b)(1) of the Act, 21 U.S.C.section 360bbb-3(b)(1), unless the authorization is terminated  or revoked sooner.       Influenza A by PCR NEGATIVE NEGATIVE Final   Influenza B by PCR NEGATIVE NEGATIVE Final    Comment: (NOTE) The Xpert Xpress SARS-CoV-2/FLU/RSV plus assay is intended as an aid in the diagnosis of influenza from Nasopharyngeal swab specimens and should not be used as a sole basis for treatment. Nasal washings and aspirates are unacceptable for Xpert Xpress SARS-CoV-2/FLU/RSV testing.  Fact Sheet for Patients: bloggercourse.com  Fact Sheet for Healthcare Providers: seriousbroker.it  This test is not yet approved or cleared by the [place]  FDA and has been authorized for detection and/or diagnosis of SARS-CoV-2 by FDA under an Emergency Use Authorization (EUA). This EUA will remain in effect (meaning this test can be used) for the duration of the COVID-19 declaration under Section 564(b)(1) of the Act, 21 U.S.C. section 360bbb-3(b)(1), unless the authorization is terminated or revoked.     Resp Syncytial Virus by PCR NEGATIVE NEGATIVE Final    Comment: (NOTE) Fact Sheet for Patients: bloggercourse.com  Fact Sheet for Healthcare Providers: seriousbroker.it  This test is not yet approved or cleared by the [place]  FDA and has been authorized for detection and/or diagnosis of SARS-CoV-2 by FDA under an Emergency Use Authorization (EUA). This EUA will remain in effect (meaning this test can be used) for the duration of the COVID-19 declaration under Section  564(b)(1) of the Act, 21 U.S.C. section 360bbb-3(b)(1), unless the authorization is terminated or revoked.  Performed at [facility], [address] [place], [place] [address]   Group A Strep by PCR     Status: None   Collection Time: 04/25/24 11:19 PM   Specimen: Anterior Nasal Swab; Sterile Swab  Result Value Ref Range Status   Group A Strep by PCR NOT DETECTED NOT DETECTED Final    Comment: Performed at [facility], [address] [place], [place] [address]     Labs: BNP (last 3 results) No results for input(s): BNP in the last 8760 hours. Basic Metabolic Panel: Recent Labs  Lab 04/25/24 2319 04/27/24 0737 04/28/24 0741  NA 136 136 134*  K 4.1 3.5 4.4  CL 99 104 103  CO2 26 24 23   GLUCOSE 121* 139* 116*  BUN 15 38* 26*  CREATININE 0.73 0.75 0.81  CALCIUM 9.0 8.5* 8.7*  MG  --  1.8 2.1   Liver Function Tests: No results for input(s): AST, ALT, ALKPHOS, BILITOT, PROT, ALBUMIN in the last 168 hours. No results for input(s): LIPASE, AMYLASE in the last 168 hours. No results for input(s): AMMONIA  in the last 168 hours. CBC: Recent Labs  Lab 04/25/24 2319 04/26/24 0356 04/27/24 0737 04/27/24 1636 04/28/24 0741  WBC 9.2  --  8.1  --  9.4  NEUTROABS 7.1  --   --   --  5.9  HGB 13.7 12.9* 9.9* 9.8* 10.0*  HCT 39.5 37.0* 28.7* 28.3* 29.2*  MCV 87.2  --  87.0  --  90.4  PLT 177  --  169  --  185   Cardiac Enzymes: No results for input(s): CKTOTAL, CKMB, CKMBINDEX, TROPONINI in the last 168 hours. BNP: Invalid input(s): POCBNP CBG: No results for input(s): GLUCAP in the last 168 hours. D-Dimer No results for input(s): DDIMER in the last 72 hours. Hgb A1c No results for input(s): HGBA1C in the last 72 hours. Lipid Profile No results for input(s): CHOL, HDL, LDLCALC, TRIG, CHOLHDL, LDLDIRECT in the last 72 hours. Thyroid  function studies No results for input(s): TSH, T4TOTAL, T3FREE,  THYROIDAB in the last 72 hours.  Invalid input(s): FREET3 Anemia work up No results for input(s): VITAMINB12, FOLATE, FERRITIN, TIBC, IRON, RETICCTPCT in the last 72 hours. Urinalysis    Component Value Date/Time   COLORURINE YELLOW 12/15/2022 1423   APPEARANCEUR CLEAR 12/15/2022 1423   LABSPEC 1.014 12/15/2022 1423   PHURINE 7.0 12/15/2022 1423   GLUCOSEU NEGATIVE 12/15/2022 1423   HGBUR NEGATIVE 12/15/2022 1423   BILIRUBINUR NEGATIVE 12/15/2022 1423   KETONESUR NEGATIVE 12/15/2022 1423   PROTEINUR NEGATIVE 12/15/2022 1423   NITRITE NEGATIVE 12/15/2022 1423   LEUKOCYTESUR NEGATIVE 12/15/2022 1423   Sepsis Labs Recent Labs  Lab 04/25/24 2319 04/27/24 0737 04/28/24 0741  WBC 9.2 8.1 9.4   Microbiology Recent Results (from the past 240 hours)  Resp panel by RT-PCR (RSV, Flu A&B, Covid) Anterior Nasal Swab     Status: None   Collection Time: 04/25/24 11:19 PM   Specimen: Anterior Nasal Swab  Result Value Ref Range Status   SARS Coronavirus 2 by RT PCR NEGATIVE NEGATIVE Final    Comment: (NOTE) SARS-CoV-2 target nucleic acids are NOT DETECTED.  The SARS-CoV-2 RNA is generally detectable in upper respiratory specimens during the acute phase of infection. The lowest concentration of SARS-CoV-2 viral copies this assay can detect is 138 copies/mL. A negative result does not preclude SARS-Cov-2 infection and should not be used as the sole basis for treatment or other patient management decisions. A negative result may occur with  improper specimen collection/handling, submission of specimen other than nasopharyngeal swab, presence of viral mutation(s) within the areas targeted by this assay, and inadequate number of viral copies(<138 copies/mL). A negative result must be combined with clinical observations, patient history, and epidemiological information. The expected result is Negative.  Fact Sheet for Patients:   bloggercourse.com  Fact Sheet for Healthcare Providers:  seriousbroker.it  This test is no t yet approved or cleared by the [place]  FDA and  has been authorized for detection and/or diagnosis of SARS-CoV-2 by FDA under an Emergency Use Authorization (EUA). This EUA will remain  in effect (meaning this test can be used) for the duration of the COVID-19 declaration under Section 564(b)(1) of the Act, 21 U.S.C.section 360bbb-3(b)(1), unless the authorization is terminated  or revoked sooner.       Influenza A by PCR NEGATIVE NEGATIVE Final   Influenza B by PCR NEGATIVE NEGATIVE Final    Comment: (NOTE) The Xpert Xpress SARS-CoV-2/FLU/RSV plus assay is intended as an aid in the diagnosis of influenza from Nasopharyngeal swab specimens and should not  be used as a sole basis for treatment. Nasal washings and aspirates are unacceptable for Xpert Xpress SARS-CoV-2/FLU/RSV testing.  Fact Sheet for Patients: bloggercourse.com  Fact Sheet for Healthcare Providers: seriousbroker.it  This test is not yet approved or cleared by the [place]  FDA and has been authorized for detection and/or diagnosis of SARS-CoV-2 by FDA under an Emergency Use Authorization (EUA). This EUA will remain in effect (meaning this test can be used) for the duration of the COVID-19 declaration under Section 564(b)(1) of the Act, 21 U.S.C. section 360bbb-3(b)(1), unless the authorization is terminated or revoked.     Resp Syncytial Virus by PCR NEGATIVE NEGATIVE Final    Comment: (NOTE) Fact Sheet for Patients: bloggercourse.com  Fact Sheet for Healthcare Providers: seriousbroker.it  This test is not yet approved or cleared by the [place]  FDA and has been authorized for detection and/or diagnosis of SARS-CoV-2 by FDA under an Emergency Use  Authorization (EUA). This EUA will remain in effect (meaning this test can be used) for the duration of the COVID-19 declaration under Section 564(b)(1) of the Act, 21 U.S.C. section 360bbb-3(b)(1), unless the authorization is terminated or revoked.  Performed at [facility], [address] [place], [place] [address]   Group A Strep by PCR     Status: None   Collection Time: 04/25/24 11:19 PM   Specimen: Anterior Nasal Swab; Sterile Swab  Result Value Ref Range Status   Group A Strep by PCR NOT DETECTED NOT DETECTED Final    Comment: Performed at [facility], [address] [place], [place] [address]     Time coordinating discharge: 35 minutes  SIGNED:   Derryl Duval, MD  [facility] 04/28/2024, 3:29 PM      [1]  Allergies Allergen Reactions   Asa [Aspirin] Other (See Comments)    Avoids due to Eliquis .   Nsaids Other (See Comments)    Avoids due to Eliquis .   Tricor [Fenofibrate] Other (See Comments)    Myalgias

## 2024-04-28 NOTE — Plan of Care (Signed)
Discharge instructions discussed with patient.  Patient instructed on home medications, restrictions, and follow up appointments. Belongings gathered and sent with patient.  Patients medications sent to [facility].  Patient discharged via wheelchair by this Probation officer.

## 2024-04-28 NOTE — Plan of Care (Signed)

## 2024-04-28 NOTE — Plan of Care (Signed)
" °  Problem: Education: Goal: Knowledge of General Education information will improve Description: Including pain rating scale, medication(s)/side effects and non-pharmacologic comfort measures 04/28/2024 0744 by Arnell Wyline RAMAN, RN Outcome: Progressing 04/28/2024 0703 by Arnell Wyline RAMAN, RN Outcome: Progressing   Problem: Health Behavior/Discharge Planning: Goal: Ability to manage health-related needs will improve 04/28/2024 0744 by Arnell Wyline RAMAN, RN Outcome: Progressing 04/28/2024 0703 by Arnell Wyline RAMAN, RN Outcome: Progressing   Problem: Clinical Measurements: Goal: Ability to maintain clinical measurements within normal limits will improve 04/28/2024 0744 by Arnell Wyline RAMAN, RN Outcome: Progressing 04/28/2024 0703 by Arnell Wyline RAMAN, RN Outcome: Progressing Goal: Will remain free from infection 04/28/2024 0744 by Arnell Wyline RAMAN, RN Outcome: Progressing 04/28/2024 0703 by Arnell Wyline RAMAN, RN Outcome: Progressing Goal: Diagnostic test results will improve 04/28/2024 0744 by Arnell Wyline RAMAN, RN Outcome: Progressing 04/28/2024 0703 by Arnell Wyline RAMAN, RN Outcome: Progressing Goal: Respiratory complications will improve 04/28/2024 0744 by Arnell Wyline RAMAN, RN Outcome: Progressing 04/28/2024 0703 by Arnell Wyline RAMAN, RN Outcome: Progressing Goal: Cardiovascular complication will be avoided 04/28/2024 0744 by Arnell Wyline RAMAN, RN Outcome: Progressing 04/28/2024 0703 by Arnell Wyline RAMAN, RN Outcome: Progressing   Problem: Activity: Goal: Risk for activity intolerance will decrease 04/28/2024 0744 by Arnell Wyline RAMAN, RN Outcome: Progressing 04/28/2024 0703 by Arnell Wyline RAMAN, RN Outcome: Progressing   Problem: Nutrition: Goal: Adequate nutrition will be maintained 04/28/2024 0744 by Arnell Wyline RAMAN, RN Outcome: Progressing 04/28/2024 0703 by Arnell Wyline RAMAN, RN Outcome: Progressing   Problem: Coping: Goal: Level  of anxiety will decrease 04/28/2024 0744 by Arnell Wyline RAMAN, RN Outcome: Progressing 04/28/2024 0703 by Arnell Wyline RAMAN, RN Outcome: Progressing   Problem: Elimination: Goal: Will not experience complications related to bowel motility 04/28/2024 0744 by Arnell Wyline RAMAN, RN Outcome: Progressing 04/28/2024 0703 by Arnell Wyline RAMAN, RN Outcome: Progressing Goal: Will not experience complications related to urinary retention 04/28/2024 0744 by Arnell Wyline RAMAN, RN Outcome: Progressing 04/28/2024 0703 by Arnell Wyline RAMAN, RN Outcome: Progressing   Problem: Pain Managment: Goal: General experience of comfort will improve and/or be controlled 04/28/2024 0744 by Arnell Wyline RAMAN, RN Outcome: Progressing 04/28/2024 0703 by Arnell Wyline RAMAN, RN Outcome: Progressing   Problem: Safety: Goal: Ability to remain free from injury will improve 04/28/2024 0744 by Arnell Wyline RAMAN, RN Outcome: Progressing 04/28/2024 0703 by Arnell Wyline RAMAN, RN Outcome: Progressing   Problem: Skin Integrity: Goal: Risk for impaired skin integrity will decrease 04/28/2024 0744 by Arnell Wyline RAMAN, RN Outcome: Progressing 04/28/2024 0703 by Arnell Wyline RAMAN, RN Outcome: Progressing   "

## 2024-04-29 NOTE — Telephone Encounter (Signed)
 Prescription refill request for Eliquis  received. Indication:afib Last office visit:10/25 Scr: 0.81  12/25 Age:78 Weight:86.2  kg  Prescription refilled

## 2024-04-30 ENCOUNTER — Other Ambulatory Visit (INDEPENDENT_AMBULATORY_CARE_PROVIDER_SITE_OTHER): Payer: Self-pay

## 2024-04-30 ENCOUNTER — Telehealth (INDEPENDENT_AMBULATORY_CARE_PROVIDER_SITE_OTHER): Payer: Self-pay

## 2024-04-30 ENCOUNTER — Encounter (HOSPITAL_BASED_OUTPATIENT_CLINIC_OR_DEPARTMENT_OTHER): Payer: Self-pay

## 2024-04-30 DIAGNOSIS — R221 Localized swelling, mass and lump, neck: Secondary | ICD-10-CM

## 2024-04-30 NOTE — Telephone Encounter (Signed)
 Spoke with patient letting them know that Dr. Anice put in a order for a Fine Needle Patient understood.

## 2024-05-01 ENCOUNTER — Encounter (HOSPITAL_COMMUNITY): Payer: Self-pay

## 2024-05-01 NOTE — Progress Notes (Signed)
 Vanice Sharper, MD  [facility] for US  Rt neck necrotic adenopathy FNA/possible core(core ok per ordering MD)  See neck CT 04/26/24  TS       Previous Messages    ----- Message ----- From: Hamna Asa Sent: 04/30/2024  12:02 PM EST To: Jaiona Simien; Ir Procedure Requests Subject: US  FINE NEEDLE ASP 1ST LESION                  Procedure : US  FINE NEEDLE ASP 1ST LESION  Reason : neck mass Dx: Neck mass [R22.1 (ICD-10-CM)]  Ordering Comments  P16 testing, okay for core if not enough is sampled.    History : CT chest w/ , CT soft tissue neck w/ , DG chest 2 view  Provider : Anice Riis, DO  Contact : [phone]

## 2024-05-01 NOTE — Progress Notes (Signed)
 Vanice Sharper, MD  Taelar Gronewold Yes, hold eliquis   Local only       Previous Messages    ----- Message ----- From: Marua Qin Sent: 05/01/2024   9:48 AM EST To: Sharper Vanice, MD Subject: RE: US  FINE NEEDLE ASP 1ST LESION              Locally ?  Do we need to hold eliquis  ? ----- Message ----- From: Vanice Sharper, MD Sent: 05/01/2024   6:58 AM EST To: Eleanor Mighty Subject: RE: US  FINE NEEDLE ASP 1ST LESION              Ok for US  Rt neck necrotic adenopathy FNA/possible core(core ok per ordering MD)  See neck CT 04/26/24  TS ----- Message ----- From: Stephaniemarie Stoffel Sent: 04/30/2024  12:02 PM EST To: Camesha Farooq; Ir Procedure Requests Subject: US  FINE NEEDLE ASP 1ST LESION                  Procedure : US  FINE NEEDLE ASP 1ST LESION  Reason : neck mass Dx: Neck mass [R22.1 (ICD-10-CM)]  Ordering Comments  P16 testing, okay for core if not enough is sampled.    History : CT chest w/ , CT soft tissue neck w/ , DG chest 2 view  Provider : Anice Riis, DO  Contact : [phone]

## 2024-05-22 ENCOUNTER — Other Ambulatory Visit (INDEPENDENT_AMBULATORY_CARE_PROVIDER_SITE_OTHER): Payer: Self-pay

## 2024-05-22 ENCOUNTER — Ambulatory Visit (HOSPITAL_COMMUNITY)
Admission: RE | Admit: 2024-05-22 | Discharge: 2024-05-22 | Disposition: A | Discharge status: Home / Self Care | Source: Ambulatory Visit

## 2024-05-22 DIAGNOSIS — R221 Localized swelling, mass and lump, neck: Secondary | ICD-10-CM

## 2024-05-22 MED ORDER — LIDOCAINE HCL 1 % IJ SOLN
INTRAMUSCULAR | Status: AC
  Filled 2024-05-22: qty 20

## 2024-05-22 NOTE — Procedures (Signed)
 Vascular and Interventional Radiology Procedure Note  Patient: Evan Robinson DOB: 1945/06/26 Medical Record Number: [id number] Note Date/Time: 05/22/24 1:34 PM   Performing Physician: Thom Hall, MD Assistant(s): None  Diagnosis: enlarged R cervical LN. Q H&N CA   Procedure: RIGHT CERVICAL LYMPH NODE BIOPSY  Anesthesia: Local Anesthetic Complications: None Estimated Blood Loss: Minimal Specimens: Sent for Cytology and Pathology  Findings:  Successful Ultrasound-guided biopsy of R cervical LN. A total of 3 samples were obtained. Hemostasis of the tract was achieved using Manual Pressure.   See detailed procedure note with images in PACS. The patient tolerated the procedure well without incident or complication and was returned to Recovery in stable condition.    Thom Hall, MD [facility]   Pager. [phone] Clinic. [phone]

## 2024-05-23 LAB — CYTOLOGY - NON PAP

## 2024-05-24 LAB — SURGICAL PATHOLOGY

## 2024-05-27 ENCOUNTER — Institutional Professional Consult (permissible substitution) (INDEPENDENT_AMBULATORY_CARE_PROVIDER_SITE_OTHER)

## 2024-05-28 ENCOUNTER — Encounter: Admitting: Physical Medicine and Rehabilitation

## 2024-05-29 ENCOUNTER — Encounter (INDEPENDENT_AMBULATORY_CARE_PROVIDER_SITE_OTHER): Payer: Self-pay

## 2024-05-29 ENCOUNTER — Ambulatory Visit (INDEPENDENT_AMBULATORY_CARE_PROVIDER_SITE_OTHER)

## 2024-05-29 VITALS — BP 155/83 | HR 63 | Wt 190.0 lb

## 2024-05-29 DIAGNOSIS — C109 Malignant neoplasm of oropharynx, unspecified: Secondary | ICD-10-CM | POA: Diagnosis not present

## 2024-05-29 DIAGNOSIS — H6122 Impacted cerumen, left ear: Secondary | ICD-10-CM

## 2024-05-29 DIAGNOSIS — C069 Malignant neoplasm of mouth, unspecified: Secondary | ICD-10-CM

## 2024-05-29 NOTE — Progress Notes (Signed)
 Dear Dr. Charlene, Here is my assessment for our mutual patient, Evan Robinson. Thank you for allowing me the opportunity to care for your patient. Please do not hesitate to contact me should you have any other questions. Sincerely, Dr. Penne Croak  Otolaryngology Clinic Note Referring provider: Dr. Charlene HPI:  Discussed the use of AI scribe software for clinical note transcription with the patient, who gave verbal consent to proceed.  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  Oropharyngeal Mass and Cervical Lymphadenopathy: - Diagnosed with oropharyngeal cancer following biopsy confirmation - Cervical lymph node metastasis present - Sore area in the neck, described as 'turkey neck,' managed with heating pad - Difficulty with tongue movement - No dysphonia, dysarthria, or overt dysphagia - Concern for potential impact on swallowing  Epistaxis and Hematemesis: - Hospitalized at Christmas for severe epistaxis and hematemesis - Discontinued Eliquis  following hospitalization - No resumption of anticoagulation since discharge - Attributes recent fatigue and reduced stamina to blood loss during hospitalization - Currently ambulatory and has regained some energy  Nutritional Status: - Follows predominantly vegan/vegetarian diet - Recently reduced sugar intake - Interested in nutritional support as part of oncology care - No tobacco or alcohol  use  Prior Malignancy: - History of prostate cancer treated with surgical resection - No history of radiation or brachytherapy for prostate cancer  Treatment Considerations: - Inquires about chemoradiation success rates, immunotherapy options, and surgical consultation - No prior nasal endoscopy or similar procedures  Independent Review of Additional Tests or  Records:  Reviewed external note from referring PCP, Gaines,describing relevant history incorporated into todays evaluation.   PMH/Meds/All/SocHx/FamHx/ROS:   Past Medical History:  Diagnosis Date   Dyslipidemia    H/O prostate cancer 05/02/2001   s/p radical prostatectomy (uro Dr. Althia in [place] yearly)   Headache 05/02/2010   thorough w/u - unrevealing.  has seen 3 neurologists, MRI, LP nonacute.  ESR = 3   HTN (hypertension)    Hypogonadism male    Low testosterone     Migraines    OSA on CPAP    Persistent atrial fibrillation (HCC) 06/28/2022   Renal cyst 12/01/2010   complex cyst upper pole L kidney with no malignancy by MRI   Vertigo    Vitamin D  deficiency      Past Surgical History:  Procedure Laterality Date   ATRIAL FIBRILLATION ABLATION N/A 07/30/2021   Procedure: ATRIAL FIBRILLATION ABLATION;  Surgeon: Cindie Ole DASEN, MD;  Location: [facility];  Service: Cardiovascular;  Laterality: N/A;   ATRIAL FIBRILLATION ABLATION N/A 10/25/2022   Procedure: ATRIAL FIBRILLATION ABLATION;  Surgeon: Cindie Ole DASEN, MD;  Location: [facility] INVASIVE CV [facility];  Service: Cardiovascular;  Laterality: N/A;   CHOLECYSTECTOMY  2006   COLONOSCOPY WITH PROPOFOL  N/A 02/01/2017   Procedure: COLONOSCOPY WITH PROPOFOL ;  Surgeon: Toledo, Ladell POUR, MD;  Location: [facility] ENDOSCOPY;  Service: Gastroenterology;  Laterality: N/A;   ESOPHAGOGASTRODUODENOSCOPY (EGD) WITH PROPOFOL  N/A 02/01/2017   Procedure: ESOPHAGOGASTRODUODENOSCOPY (EGD) WITH PROPOFOL ;  Surgeon: Toledo, Ladell POUR, MD;  Location: [facility] ENDOSCOPY;  Service: Gastroenterology;  Laterality: N/A;   HERNIA REPAIR  08/2010   PROSTATECTOMY  2003    Family History  Problem Relation Age of Onset   Alcohol  abuse Father    Cancer Father        prostate   CAD Father  possible MI   Alcohol  abuse Paternal Grandfather    Stroke Mother    Diabetes Neg Hx      Social Connections: Socially Integrated (04/26/2024)   Social  Connection and Isolation Panel    Frequency of Communication with Friends and Family: More than three times a week    Frequency of Social Gatherings with Friends and Family: More than three times a week    Attends Religious Services: More than 4 times per year    Active Member of [employer] or Organizations: Yes    Attends Engineer, Structural: More than 4 times per year    Marital Status: Married     Current Medications[1]   Physical Exam:   BP (!) 155/83 (BP Location: Left Arm, Patient Position: Sitting, Cuff Size: Normal) Comment: white coat  Pulse 63   Wt 190 lb (86.2 kg)   SpO2 98%   BMI 25.77 kg/m   The patient was awake, alert, and appropriate. The external ears were inspected, and otoscopy was performed to evaluate the external auditory canals and tympanic membranes. The nasal cavity and septum were examined for mucosal changes, obstruction, or discharge. The oral cavity and oropharynx were inspected for mucosal lesions, infection, or tonsillar hypertrophy. The neck was palpated for lymphadenopathy, thyroid  abnormalities, or other masses. Cranial nerve function was grossly intact.  Pertinent Findings: General: Well developed, well nourished. No acute distress. Voice without hoarseness Head/Face: Normocephalic. No sinus tenderness. Facial nerve intact and equal bilaterally. No facial lacerations. Eyes: PERRL, no scleral icterus or conjunctival hemorrhage. EOMI. Ears: No gross deformity. Normal external canal. Tympanic membrane with normal landmarks bilaterally Hearing: Normal speech reception.  Nose: No gross deformity or lesions. No purulent discharge. No turbinate hypertrophy.  Mouth/Oropharynx: Lips without any lesions. Dentition fair. No mucosal lesions within the oropharynx. No tonsillar enlargement, exudate, or lesions. Pharyngeal walls symmetrical. Uvula midline. Tongue midline without lesions. Larynx: See TFL if applicable Nasopharynx: See TFL if applicable Neck:  Trachea midline. No masses. No thyromegaly or nodules palpated. No crepitus. Lymphatic: No lymphadenopathy in the neck. Respiratory: No stridor or distress. Room air. Cardiovascular: Regular rate and rhythm. Extremities: No edema or cyanosis. Warm and well-perfused. Skin: No scars or lesions on face or neck. Neurologic: CN II-XII grossly intact. Moving all extremities without gross abnormality. Other:  Physical Exam HEENT: Atraumatic, normocephalic. Nose with mass in back and midline septum. Throat with mass near soft palate and uvula.  Right tonsil and right soft palate    Right nasopharynx    Right soft palate and tonsil    Seprately Identifiable Procedures:  I personally ordered, reviewed and interpreted the following with the patient today  Given the patient's symptoms and incomplete visualization of critical sinonasal areas with anterior rhinoscopy, a separately performed diagnostic nasal endoscopy procedure is indicated for a complete rhinologic evaluation per American Rhinologic Society recommendations (https://www.american-rhinologic.org/position-statements)  I personally performed, reviewed and interpreted the following with the patient today  Procedure Note Diagnostic Nasal Endoscopy CPT CODE -- 68768 - Mod 25  Prior to initiating any procedures, risks/benefits/alternatives were explained to the patient and verbal consent obtained.  Pre-procedure diagnosis: Concern for obstruction and invasion Post-procedure diagnosis: same Indication: See pre-procedure diagnosis and physical exam above Complications: None apparent EBL: 0 mL Anesthesia: Lidocaine  4% and topical decongestant was topically sprayed in each nasal cavity  Description of Procedure:  Patient was identified. A flexible fiberoptic endoscope was utilized to evaluate the sinonasal cavities, mucosa, sinus ostia and turbinates and septum.  Overall,  signs of mucosal inflammation are not noted.  Also noted are  soft palate lesion.  No mucopurulence, polyps noted.   Right Middle meatus: clear Right SE Recess: clear Left MM: clear Left SE Recess: clear Photodocumentation was obtained.  Procedure Note Pre-procedure diagnosis:  right tonsil mass Post-procedure diagnosis: Same Procedure: Transnasal Fiberoptic Laryngoscopy, CPT 31575 - Mod 25 Indication: right tonsil mass, neck mass SCCA. Evaluate extension Complications: None apparent EBL: 0 mL  The procedure was undertaken to further evaluate the patient's complaint of right tonsil mass, with mirror exam inadequate for appropriate examination due to gag reflex and poor patient tolerance  Procedure:  Patient was identified as correct patient. Verbal consent was obtained. The nose was sprayed with oxymetazoline  and 4% lidocaine . The The flexible laryngoscope was passed through the nose to view the nasal cavity, pharynx (oropharynx, hypopharynx) and larynx.  The larynx was examined at rest and during multiple phonatory tasks. Documentation was obtained and reviewed with patient. The scope was removed. The patient tolerated the procedure well.  Findings: The nasal cavity and nasopharynx did not reveal any masses or lesions, mucosa appeared to be without obvious lesions. The tongue base, pharyngeal walls, piriform sinuses, vallecula, epiglottis and postcricoid region are normal in appearance EXCEPT: posterior right tonsil with papillomatous lesion  Electronically signed by: Penne Croak, DO 05/29/2024 1:03 PM   Procedure: Bilateral ear microscopy and cerumen removal using microscope (CPT 69210) - Mod 25 Pre-procedure diagnosis: Cerumen impaction left external ear/ears Post-procedure diagnosis: same Indication: left cerumen impaction; given patient's otologic complaints and history as well as for improved and comprehensive examination of external ear and tympanic membrane, bilateral otologic examination using microscope was performed and impacted cerumen  removed  Procedure: Patient was placed semi-recumbent. Both ear canals were examined using the microscope with findings above. Cerumen removed on left using suction and currette with improvement in EAC examination and patency. Left: EAC was patent. TM was intact . Middle ear was aerated. Drainage: absent Right: EAC was patent. TM was intact . Middle ear was aerated . Drainage: absent Patient tolerated the procedure well.   Impression & Plans:  Evan Robinson is a 79 y.o. male  1. Squamous cell carcinoma of oropharynx (HCC)   2. Left ear impacted cerumen   3. 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 - Risks, benefits, and alternatives were reviewed. Through shared decision making, the patient elects to proceed with below. 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 Primary tumor  measures ~3 cm, lateral and he has an elongated soft palate.  - Extended discussion with patient regarding treatment options. He had a good result with surgery treating his prostate cancer and is interested in discussing surgical intervention. Will send referral to [facility]. He understood that his tumor and neck disease is quite large and that his surgery may involve a free flap if cancer is deemed resectable. He also understood that this may be better treated with CRT.  - Discuss case at multidisciplinary tumor board next Wednesday for treatment strategy. - Refer to radiation and medical oncology for therapy initiation. - Refer to head and neck surgical oncology for surgical candidacy assessment. - Obtain photographic documentation of oropharyngeal and neck findings. -  Perform nasal  endoscopy to evaluate disease extent. - Continue Eliquis ; reassess if surgery planned. - Coordinate with tumor board coordinator for oncology appointments. - Provide reassurance on treatability and expected outcomes.  - Orders placed:  Orders Placed This Encounter  Procedures   NM PET Image Initial (PI) Skull Base To Thigh (F-18 FDG)   - Medications prescribed/continued/adjusted: No orders of the defined types were placed in this encounter.  - Education materials provided to the patient. - Follow up: 1 month. Patient instructed to return sooner or go to the ED if new/worsening symptoms develop.  Thank you for allowing me the opportunity to care for your patient. Please do not hesitate to contact me should you have any other questions.  Sincerely, Penne Croak, DO Otolaryngologist (ENT) [facility] Phone: [phone] Fax: [phone]  05/29/2024, 1:03 PM        [1]  Current Outpatient Medications:    acetaminophen  (TYLENOL ) 500 MG tablet, Take 1,000 mg by mouth every 6 (six) hours as needed for moderate pain (pain score 4-6)., Disp: , Rfl:    ALPRAZolam  (XANAX ) 0.5 MG tablet, Take 0.5 mg by mouth as needed., Disp: , Rfl:    Ascorbic Acid  (VITAMIN C  PO), Take 1 tablet by mouth daily., Disp: , Rfl:    ASHWAGANDHA PO, Take 1 tablet by mouth every morning., Disp: , Rfl:    b complex vitamins capsule, Take 1 capsule by mouth daily., Disp: , Rfl:    baclofen  (LIORESAL ) 10 MG tablet, Take 10 mg by mouth as needed., Disp: , Rfl:    Carboxymethylcellul-Glycerin  (CLEAR EYES FOR DRY EYES OP), Place 1 drop into both eyes 3 (three) times daily as needed (dryness, irritation)., Disp: , Rfl:    dofetilide  (TIKOSYN ) 250 MCG capsule, Take 1 capsule (250 mcg total) by mouth 2 (two) times daily., Disp: 180 capsule, Rfl: 1   ELIQUIS  5 MG TABS tablet, TAKE 1 TABLET(5 MG) BY MOUTH TWICE DAILY, Disp: 60 tablet, Rfl: 5   gemfibrozil  (LOPID ) 600 MG tablet, Take 600 mg  by mouth daily., Disp: , Rfl:    losartan  (COZAAR ) 25 MG tablet, Take 1 tablet (25 mg total) by mouth daily., Disp: 90 tablet, Rfl: 3   magnesium  oxide (MAG-OX) 400 MG tablet, Take 1 tablet (400 mg total) by mouth daily at 12 noon., Disp: 90 tablet, Rfl: 1   meclizine  (ANTIVERT ) 25 MG tablet, Take 1 tablet (25 mg total) by mouth 3 (three) times daily as needed for dizziness., Disp: 30 tablet, Rfl: 0   nebivolol  (BYSTOLIC ) 5 MG tablet, Take 1 tablet (5 mg total) by mouth daily., Disp: 90 tablet, Rfl: 3   oxymetazoline  (AFRIN) 0.05 % nasal spray, Place 2 sprays into both nostrils as needed (Nasal bleeding)., Disp: 30 mL, Rfl: 0   pregabalin  (LYRICA ) 100 MG capsule, Take 1 capsule (100 mg total) by mouth 3 (three) times daily., Disp: 270 capsule, Rfl: 3   SUMAtriptan  (IMITREX ) 50 MG tablet, Take 1 tablet (50 mg total) by mouth once as needed for migraine. May repeat in 2 hours if headache persists or recurs., Disp: 30 tablet, Rfl: 2   Turmeric (QC TUMERIC COMPLEX PO), Take 1 tablet by mouth every morning., Disp: , Rfl:    zolpidem  (AMBIEN ) 10 MG tablet, Take 10 mg by mouth at bedtime., Disp: , Rfl:

## 2024-05-30 ENCOUNTER — Encounter (HOSPITAL_BASED_OUTPATIENT_CLINIC_OR_DEPARTMENT_OTHER): Payer: Self-pay

## 2024-05-30 NOTE — Telephone Encounter (Signed)
 Instructed him to resume Eliquis  (may need to hold for future procedure due to cancer but can be addressed at that time). I do not think likely to stop given he is maintained on Tikosyn  but wanted your input.   Thanks! Bonny Vanleeuwen S Antionne Enrique, NP

## 2024-06-06 ENCOUNTER — Telehealth: Payer: Self-pay | Admitting: Oncology

## 2024-06-06 ENCOUNTER — Other Ambulatory Visit: Payer: Self-pay

## 2024-06-06 ENCOUNTER — Ambulatory Visit (HOSPITAL_COMMUNITY): Admission: RE | Admit: 2024-06-06

## 2024-06-06 DIAGNOSIS — C109 Malignant neoplasm of oropharynx, unspecified: Secondary | ICD-10-CM

## 2024-06-06 DIAGNOSIS — C760 Malignant neoplasm of head, face and neck: Secondary | ICD-10-CM

## 2024-06-06 MED ORDER — FLUDEOXYGLUCOSE F - 18 (FDG) INJECTION
10.2200 | Freq: Once | INTRAVENOUS | Status: AC | PRN
  Administered 2024-06-06: 10.22 via INTRAVENOUS

## 2024-06-06 NOTE — Telephone Encounter (Signed)
 Scheduled appointments per Dr.Pasam and Dr.Squire's direction. Talked with the patient and he is aware of the appointment time and date as well as the address. Patient was informed to arrive 10-15 minutes prior with updated insurance information. Patient was also informed that two guests are permitted but needs to be at least 80 years of age. All questions were answered.

## 2024-06-06 NOTE — Progress Notes (Signed)
 Oncology Nurse Navigator Documentation   Placed introductory call to new referral patient Evan Robinson.  Introduced myself as the H&N oncology nurse navigator that works with Dr. Izell and Dr. Autumn to whom he has been referred by Dr. Anice. He confirmed understanding of referral. Briefly explained my role as his navigator, provided my contact information.  Confirmed understanding of upcoming appts and [facility] location, explained arrival and registration process. I explained the purpose of a dental evaluation prior to starting RT, indicated he would be contacted by [facility] dental, Dr. Danial to arrange an appt.   I encouraged him to call with questions/concerns as he moves forward with appts and procedures.   He verbalized understanding of information provided, expressed appreciation for my call.   Navigator Initial Assessment Employment Status: he is retired Currently on [employer] / STD: no Living Situation: he lives with his wife Support System: wife PCP: [name] PCD: Financial Concerns: no Transportation Needs: no Sensory Deficits: no Chiropodist Needed:  no Ambulation Needs: no Psychosocial Needs:  no Concerns/Needs Understanding Cancer:  addressed/answered by navigator to best of ability Self-Expressed Needs: no   Tourist Information Centre Manager, BSN, OCN Head & Neck Oncology Nurse Navigator [facility] Phone # [phone]  Fax # [phone]

## 2024-06-14 ENCOUNTER — Ambulatory Visit: Admitting: Radiation Oncology

## 2024-06-14 ENCOUNTER — Ambulatory Visit

## 2024-06-19 ENCOUNTER — Inpatient Hospital Stay: Admitting: Oncology

## 2024-06-19 ENCOUNTER — Inpatient Hospital Stay

## 2024-06-27 ENCOUNTER — Ambulatory Visit (INDEPENDENT_AMBULATORY_CARE_PROVIDER_SITE_OTHER)

## 2024-07-09 ENCOUNTER — Encounter: Admitting: Physical Medicine and Rehabilitation

## 2024-07-26 ENCOUNTER — Ambulatory Visit (HOSPITAL_COMMUNITY): Admitting: Internal Medicine
